# Patient Record
Sex: Female | Born: 1935 | Race: White | Hispanic: No | Smoking: Former smoker
Health system: Southern US, Community
[De-identification: ages and names within clinical notes are randomized; demographics above are authoritative.]

## PROBLEM LIST (undated history)

## (undated) DIAGNOSIS — N281 Cyst of kidney, acquired: Secondary | ICD-10-CM

## (undated) DIAGNOSIS — E2749 Other adrenocortical insufficiency: Secondary | ICD-10-CM

## (undated) DIAGNOSIS — Z8639 Personal history of other endocrine, nutritional and metabolic disease: Secondary | ICD-10-CM

## (undated) DIAGNOSIS — F0390 Unspecified dementia without behavioral disturbance: Secondary | ICD-10-CM

## (undated) DIAGNOSIS — D472 Monoclonal gammopathy: Secondary | ICD-10-CM

## (undated) DIAGNOSIS — R0602 Shortness of breath: Secondary | ICD-10-CM

## (undated) DIAGNOSIS — M1990 Unspecified osteoarthritis, unspecified site: Secondary | ICD-10-CM

## (undated) DIAGNOSIS — D649 Anemia, unspecified: Secondary | ICD-10-CM

## (undated) DIAGNOSIS — F419 Anxiety disorder, unspecified: Secondary | ICD-10-CM

## (undated) DIAGNOSIS — F329 Major depressive disorder, single episode, unspecified: Secondary | ICD-10-CM

## (undated) DIAGNOSIS — L309 Dermatitis, unspecified: Secondary | ICD-10-CM

## (undated) DIAGNOSIS — M25559 Pain in unspecified hip: Secondary | ICD-10-CM

## (undated) DIAGNOSIS — N952 Postmenopausal atrophic vaginitis: Secondary | ICD-10-CM

## (undated) DIAGNOSIS — K5730 Diverticulosis of large intestine without perforation or abscess without bleeding: Secondary | ICD-10-CM

## (undated) DIAGNOSIS — M48061 Spinal stenosis, lumbar region without neurogenic claudication: Secondary | ICD-10-CM

## (undated) DIAGNOSIS — Z8719 Personal history of other diseases of the digestive system: Secondary | ICD-10-CM

## (undated) DIAGNOSIS — L570 Actinic keratosis: Secondary | ICD-10-CM

## (undated) DIAGNOSIS — N184 Chronic kidney disease, stage 4 (severe): Secondary | ICD-10-CM

## (undated) DIAGNOSIS — F32A Depression, unspecified: Secondary | ICD-10-CM

## (undated) DIAGNOSIS — N393 Stress incontinence (female) (male): Secondary | ICD-10-CM

## (undated) DIAGNOSIS — Z87891 Personal history of nicotine dependence: Secondary | ICD-10-CM

## (undated) DIAGNOSIS — Z9289 Personal history of other medical treatment: Secondary | ICD-10-CM

## (undated) DIAGNOSIS — S72012A Unspecified intracapsular fracture of left femur, initial encounter for closed fracture: Secondary | ICD-10-CM

## (undated) DIAGNOSIS — M8580 Other specified disorders of bone density and structure, unspecified site: Secondary | ICD-10-CM

## (undated) DIAGNOSIS — Z8711 Personal history of peptic ulcer disease: Secondary | ICD-10-CM

## (undated) DIAGNOSIS — K9041 Non-celiac gluten sensitivity: Secondary | ICD-10-CM

## (undated) DIAGNOSIS — C189 Malignant neoplasm of colon, unspecified: Secondary | ICD-10-CM

## (undated) HISTORY — DX: Personal history of nicotine dependence: Z87.891

## (undated) HISTORY — PX: KNEE ARTHROSCOPY: SHX127

## (undated) HISTORY — PX: CARPAL TUNNEL RELEASE: SHX101

## (undated) HISTORY — DX: Anxiety disorder, unspecified: F41.9

## (undated) HISTORY — PX: HEMICOLECTOMY: SHX854

## (undated) HISTORY — DX: Actinic keratosis: L57.0

## (undated) HISTORY — DX: Pain in unspecified hip: M25.559

## (undated) HISTORY — DX: Unspecified dementia, unspecified severity, without behavioral disturbance, psychotic disturbance, mood disturbance, and anxiety: F03.90

## (undated) HISTORY — PX: OVARIAN CYST REMOVAL: SHX89

## (undated) HISTORY — DX: Major depressive disorder, single episode, unspecified: F32.9

## (undated) HISTORY — DX: Spinal stenosis, lumbar region without neurogenic claudication: M48.061

## (undated) HISTORY — DX: Monoclonal gammopathy: D47.2

## (undated) HISTORY — DX: Cyst of kidney, acquired: N28.1

## (undated) HISTORY — DX: Diverticulosis of large intestine without perforation or abscess without bleeding: K57.30

## (undated) HISTORY — DX: Postmenopausal atrophic vaginitis: N95.2

## (undated) HISTORY — DX: Depression, unspecified: F32.A

## (undated) HISTORY — DX: Dermatitis, unspecified: L30.9

## (undated) HISTORY — PX: CATARACT EXTRACTION W/ INTRAOCULAR LENS  IMPLANT, BILATERAL: SHX1307

## (undated) HISTORY — PX: CHOLECYSTECTOMY: SHX55

## (undated) HISTORY — DX: Other specified disorders of bone density and structure, unspecified site: M85.80

## (undated) HISTORY — PX: LUMBAR LAMINECTOMY: SHX95

## (undated) HISTORY — PX: LUMBAR FUSION: SHX111

## (undated) HISTORY — PX: APPENDECTOMY: SHX54

## (undated) HISTORY — PX: BACK SURGERY: SHX140

## (undated) HISTORY — PX: REPLACEMENT TOTAL KNEE: SUR1224

## (undated) HISTORY — PX: DILATION AND CURETTAGE OF UTERUS: SHX78

---
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
## 1997-08-22 ENCOUNTER — Other Ambulatory Visit: Admission: RE | Admit: 1997-08-22 | Discharge: 1997-08-22 | Payer: Self-pay | Admitting: Internal Medicine

## 1997-08-22 ENCOUNTER — Encounter: Admission: RE | Admit: 1997-08-22 | Discharge: 1997-08-22 | Payer: Self-pay | Admitting: Internal Medicine

## 1997-09-30 ENCOUNTER — Encounter: Admission: RE | Admit: 1997-09-30 | Discharge: 1997-09-30 | Payer: Self-pay | Admitting: Internal Medicine

## 1997-11-24 ENCOUNTER — Encounter: Admission: RE | Admit: 1997-11-24 | Discharge: 1997-11-24 | Payer: Self-pay | Admitting: Internal Medicine

## 1997-11-24 ENCOUNTER — Ambulatory Visit (HOSPITAL_COMMUNITY): Admission: RE | Admit: 1997-11-24 | Discharge: 1997-11-24 | Payer: Self-pay | Admitting: Internal Medicine

## 1998-02-17 ENCOUNTER — Encounter: Admission: RE | Admit: 1998-02-17 | Discharge: 1998-02-17 | Payer: Self-pay | Admitting: Internal Medicine

## 1998-02-19 ENCOUNTER — Emergency Department (HOSPITAL_COMMUNITY): Admission: EM | Admit: 1998-02-19 | Discharge: 1998-02-19 | Payer: Self-pay | Admitting: Emergency Medicine

## 1998-02-21 ENCOUNTER — Emergency Department (HOSPITAL_COMMUNITY): Admission: EM | Admit: 1998-02-21 | Discharge: 1998-02-21 | Payer: Self-pay | Admitting: Emergency Medicine

## 1998-12-01 ENCOUNTER — Emergency Department (HOSPITAL_COMMUNITY): Admission: EM | Admit: 1998-12-01 | Discharge: 1998-12-01 | Payer: Self-pay | Admitting: Emergency Medicine

## 1998-12-01 ENCOUNTER — Encounter: Payer: Self-pay | Admitting: Emergency Medicine

## 1998-12-01 ENCOUNTER — Encounter: Admission: RE | Admit: 1998-12-01 | Discharge: 1998-12-01 | Payer: Self-pay | Admitting: Internal Medicine

## 1999-02-02 ENCOUNTER — Encounter: Admission: RE | Admit: 1999-02-02 | Discharge: 1999-02-02 | Payer: Self-pay | Admitting: Internal Medicine

## 1999-05-24 ENCOUNTER — Encounter: Admission: RE | Admit: 1999-05-24 | Discharge: 1999-05-24 | Payer: Self-pay | Admitting: Internal Medicine

## 1999-05-30 ENCOUNTER — Ambulatory Visit (HOSPITAL_COMMUNITY): Admission: RE | Admit: 1999-05-30 | Discharge: 1999-05-30 | Payer: Self-pay

## 1999-10-12 ENCOUNTER — Encounter: Admission: RE | Admit: 1999-10-12 | Discharge: 1999-10-12 | Payer: Self-pay | Admitting: Internal Medicine

## 1999-12-13 ENCOUNTER — Ambulatory Visit (HOSPITAL_COMMUNITY): Admission: RE | Admit: 1999-12-13 | Discharge: 1999-12-13 | Payer: Self-pay | Admitting: Gastroenterology

## 1999-12-13 ENCOUNTER — Encounter (INDEPENDENT_AMBULATORY_CARE_PROVIDER_SITE_OTHER): Payer: Self-pay | Admitting: Specialist

## 1999-12-15 ENCOUNTER — Ambulatory Visit (HOSPITAL_COMMUNITY): Admission: RE | Admit: 1999-12-15 | Discharge: 1999-12-15 | Payer: Self-pay | Admitting: Gastroenterology

## 1999-12-15 ENCOUNTER — Encounter: Payer: Self-pay | Admitting: Gastroenterology

## 1999-12-20 ENCOUNTER — Encounter: Admission: RE | Admit: 1999-12-20 | Discharge: 1999-12-20 | Payer: Self-pay | Admitting: Gastroenterology

## 1999-12-20 ENCOUNTER — Encounter: Payer: Self-pay | Admitting: Gastroenterology

## 2000-01-10 ENCOUNTER — Encounter: Payer: Self-pay | Admitting: General Surgery

## 2000-01-15 ENCOUNTER — Encounter (INDEPENDENT_AMBULATORY_CARE_PROVIDER_SITE_OTHER): Payer: Self-pay | Admitting: Specialist

## 2000-01-15 ENCOUNTER — Inpatient Hospital Stay (HOSPITAL_COMMUNITY): Admission: RE | Admit: 2000-01-15 | Discharge: 2000-02-02 | Payer: Self-pay | Admitting: General Surgery

## 2000-01-19 ENCOUNTER — Encounter: Payer: Self-pay | Admitting: General Surgery

## 2000-01-22 ENCOUNTER — Encounter: Payer: Self-pay | Admitting: General Surgery

## 2000-01-24 ENCOUNTER — Encounter: Payer: Self-pay | Admitting: General Surgery

## 2000-01-24 ENCOUNTER — Encounter: Payer: Self-pay | Admitting: Gastroenterology

## 2000-01-25 ENCOUNTER — Encounter: Payer: Self-pay | Admitting: General Surgery

## 2000-05-23 ENCOUNTER — Inpatient Hospital Stay (HOSPITAL_COMMUNITY): Admission: EM | Admit: 2000-05-23 | Discharge: 2000-05-27 | Payer: Self-pay | Admitting: Emergency Medicine

## 2000-05-23 ENCOUNTER — Encounter: Payer: Self-pay | Admitting: General Surgery

## 2000-09-11 ENCOUNTER — Encounter: Admission: RE | Admit: 2000-09-11 | Discharge: 2000-09-11 | Payer: Self-pay | Admitting: General Surgery

## 2000-09-11 ENCOUNTER — Encounter: Payer: Self-pay | Admitting: General Surgery

## 2000-09-18 ENCOUNTER — Encounter: Admission: RE | Admit: 2000-09-18 | Discharge: 2000-09-18 | Payer: Self-pay | Admitting: Internal Medicine

## 2000-09-19 ENCOUNTER — Encounter: Admission: RE | Admit: 2000-09-19 | Discharge: 2000-09-19 | Payer: Self-pay | Admitting: Internal Medicine

## 2000-09-23 ENCOUNTER — Encounter: Payer: Self-pay | Admitting: Internal Medicine

## 2000-09-23 ENCOUNTER — Encounter: Admission: RE | Admit: 2000-09-23 | Discharge: 2000-09-23 | Payer: Self-pay | Admitting: Internal Medicine

## 2000-12-08 ENCOUNTER — Encounter: Admission: RE | Admit: 2000-12-08 | Discharge: 2000-12-08 | Payer: Self-pay | Admitting: Internal Medicine

## 2001-01-21 ENCOUNTER — Encounter (INDEPENDENT_AMBULATORY_CARE_PROVIDER_SITE_OTHER): Payer: Self-pay | Admitting: *Deleted

## 2001-01-21 ENCOUNTER — Ambulatory Visit (HOSPITAL_COMMUNITY): Admission: RE | Admit: 2001-01-21 | Discharge: 2001-01-21 | Payer: Self-pay | Admitting: Gastroenterology

## 2001-02-25 ENCOUNTER — Encounter: Payer: Self-pay | Admitting: General Surgery

## 2001-02-25 ENCOUNTER — Encounter: Admission: RE | Admit: 2001-02-25 | Discharge: 2001-02-25 | Payer: Self-pay | Admitting: General Surgery

## 2001-03-02 ENCOUNTER — Encounter (INDEPENDENT_AMBULATORY_CARE_PROVIDER_SITE_OTHER): Payer: Self-pay | Admitting: Specialist

## 2001-03-02 ENCOUNTER — Ambulatory Visit (HOSPITAL_BASED_OUTPATIENT_CLINIC_OR_DEPARTMENT_OTHER): Admission: RE | Admit: 2001-03-02 | Discharge: 2001-03-02 | Payer: Self-pay | Admitting: General Surgery

## 2001-03-30 ENCOUNTER — Encounter: Payer: Self-pay | Admitting: Emergency Medicine

## 2001-03-31 ENCOUNTER — Encounter: Admission: RE | Admit: 2001-03-31 | Discharge: 2001-03-31 | Payer: Self-pay

## 2001-03-31 ENCOUNTER — Inpatient Hospital Stay (HOSPITAL_COMMUNITY): Admission: EM | Admit: 2001-03-31 | Discharge: 2001-04-06 | Payer: Self-pay | Admitting: Emergency Medicine

## 2001-03-31 ENCOUNTER — Encounter: Payer: Self-pay | Admitting: General Surgery

## 2001-03-31 ENCOUNTER — Encounter: Payer: Self-pay | Admitting: Emergency Medicine

## 2001-04-01 ENCOUNTER — Encounter: Payer: Self-pay | Admitting: General Surgery

## 2001-04-02 ENCOUNTER — Encounter: Payer: Self-pay | Admitting: General Surgery

## 2001-04-03 ENCOUNTER — Encounter: Payer: Self-pay | Admitting: General Surgery

## 2001-06-24 ENCOUNTER — Encounter: Admission: RE | Admit: 2001-06-24 | Discharge: 2001-06-24 | Payer: Self-pay | Admitting: Internal Medicine

## 2001-07-14 ENCOUNTER — Encounter: Admission: RE | Admit: 2001-07-14 | Discharge: 2001-07-14 | Payer: Self-pay | Admitting: Internal Medicine

## 2001-07-31 ENCOUNTER — Encounter: Admission: RE | Admit: 2001-07-31 | Discharge: 2001-07-31 | Payer: Self-pay | Admitting: Internal Medicine

## 2001-07-31 ENCOUNTER — Other Ambulatory Visit: Admission: RE | Admit: 2001-07-31 | Discharge: 2001-07-31 | Payer: Self-pay | Admitting: Internal Medicine

## 2001-10-08 ENCOUNTER — Encounter: Admission: RE | Admit: 2001-10-08 | Discharge: 2001-10-08 | Payer: Self-pay | Admitting: Internal Medicine

## 2001-10-08 ENCOUNTER — Encounter: Payer: Self-pay | Admitting: Internal Medicine

## 2001-12-30 ENCOUNTER — Encounter: Admission: RE | Admit: 2001-12-30 | Discharge: 2001-12-30 | Payer: Self-pay | Admitting: Internal Medicine

## 2002-07-20 ENCOUNTER — Encounter: Admission: RE | Admit: 2002-07-20 | Discharge: 2002-07-20 | Payer: Self-pay | Admitting: Internal Medicine

## 2002-08-09 ENCOUNTER — Encounter: Admission: RE | Admit: 2002-08-09 | Discharge: 2002-08-09 | Payer: Self-pay | Admitting: Infectious Diseases

## 2002-08-16 ENCOUNTER — Encounter: Admission: RE | Admit: 2002-08-16 | Discharge: 2002-08-16 | Payer: Self-pay | Admitting: Internal Medicine

## 2002-08-23 ENCOUNTER — Encounter: Admission: RE | Admit: 2002-08-23 | Discharge: 2002-08-23 | Payer: Self-pay | Admitting: Internal Medicine

## 2002-08-27 ENCOUNTER — Encounter (INDEPENDENT_AMBULATORY_CARE_PROVIDER_SITE_OTHER): Payer: Self-pay | Admitting: Specialist

## 2002-08-27 ENCOUNTER — Ambulatory Visit (HOSPITAL_COMMUNITY): Admission: RE | Admit: 2002-08-27 | Discharge: 2002-08-27 | Payer: Self-pay | Admitting: Gastroenterology

## 2002-08-28 ENCOUNTER — Encounter: Payer: Self-pay | Admitting: Gastroenterology

## 2002-08-30 ENCOUNTER — Inpatient Hospital Stay (HOSPITAL_COMMUNITY): Admission: EM | Admit: 2002-08-30 | Discharge: 2002-08-31 | Payer: Self-pay | Admitting: Emergency Medicine

## 2002-09-14 ENCOUNTER — Encounter: Payer: Self-pay | Admitting: Internal Medicine

## 2002-09-14 ENCOUNTER — Encounter: Admission: RE | Admit: 2002-09-14 | Discharge: 2002-09-14 | Payer: Self-pay | Admitting: Internal Medicine

## 2002-12-09 ENCOUNTER — Encounter: Admission: RE | Admit: 2002-12-09 | Discharge: 2002-12-09 | Payer: Self-pay | Admitting: Internal Medicine

## 2002-12-27 ENCOUNTER — Encounter: Admission: RE | Admit: 2002-12-27 | Discharge: 2002-12-27 | Payer: Self-pay | Admitting: Internal Medicine

## 2002-12-28 ENCOUNTER — Encounter: Admission: RE | Admit: 2002-12-28 | Discharge: 2003-03-28 | Payer: Self-pay | Admitting: Internal Medicine

## 2003-03-09 ENCOUNTER — Encounter: Admission: RE | Admit: 2003-03-09 | Discharge: 2003-03-09 | Payer: Self-pay | Admitting: Internal Medicine

## 2003-03-15 ENCOUNTER — Encounter: Admission: RE | Admit: 2003-03-15 | Discharge: 2003-03-15 | Payer: Self-pay | Admitting: Internal Medicine

## 2003-06-23 ENCOUNTER — Encounter: Admission: RE | Admit: 2003-06-23 | Discharge: 2003-06-23 | Payer: Self-pay | Admitting: Internal Medicine

## 2003-08-03 ENCOUNTER — Encounter: Admission: RE | Admit: 2003-08-03 | Discharge: 2003-08-03 | Payer: Self-pay | Admitting: Orthopedic Surgery

## 2003-08-05 ENCOUNTER — Encounter: Admission: RE | Admit: 2003-08-05 | Discharge: 2003-08-05 | Payer: Self-pay | Admitting: Internal Medicine

## 2003-08-08 ENCOUNTER — Inpatient Hospital Stay (HOSPITAL_COMMUNITY): Admission: RE | Admit: 2003-08-08 | Discharge: 2003-08-12 | Payer: Self-pay | Admitting: Orthopedic Surgery

## 2003-08-12 ENCOUNTER — Inpatient Hospital Stay (HOSPITAL_COMMUNITY)
Admission: RE | Admit: 2003-08-12 | Discharge: 2003-08-19 | Payer: Self-pay | Admitting: Physical Medicine & Rehabilitation

## 2003-09-20 ENCOUNTER — Encounter: Admission: RE | Admit: 2003-09-20 | Discharge: 2003-11-03 | Payer: Self-pay | Admitting: Orthopedic Surgery

## 2003-10-28 ENCOUNTER — Encounter: Admission: RE | Admit: 2003-10-28 | Discharge: 2003-10-28 | Payer: Self-pay | Admitting: Internal Medicine

## 2003-11-23 ENCOUNTER — Encounter: Admission: RE | Admit: 2003-11-23 | Discharge: 2003-11-23 | Payer: Self-pay | Admitting: Internal Medicine

## 2004-01-03 ENCOUNTER — Ambulatory Visit: Payer: Self-pay | Admitting: Internal Medicine

## 2004-02-03 ENCOUNTER — Ambulatory Visit: Payer: Self-pay | Admitting: Internal Medicine

## 2004-02-08 ENCOUNTER — Ambulatory Visit: Payer: Self-pay | Admitting: Internal Medicine

## 2004-02-10 ENCOUNTER — Ambulatory Visit: Payer: Self-pay | Admitting: Internal Medicine

## 2004-02-10 ENCOUNTER — Ambulatory Visit (HOSPITAL_COMMUNITY): Admission: RE | Admit: 2004-02-10 | Discharge: 2004-02-10 | Payer: Self-pay | Admitting: Internal Medicine

## 2004-02-14 ENCOUNTER — Ambulatory Visit: Payer: Self-pay | Admitting: Internal Medicine

## 2004-02-17 ENCOUNTER — Ambulatory Visit: Payer: Self-pay | Admitting: Internal Medicine

## 2004-02-24 ENCOUNTER — Ambulatory Visit: Payer: Self-pay | Admitting: Internal Medicine

## 2004-04-04 ENCOUNTER — Ambulatory Visit: Payer: Self-pay | Admitting: Internal Medicine

## 2004-04-18 ENCOUNTER — Ambulatory Visit: Payer: Self-pay | Admitting: Internal Medicine

## 2004-06-21 ENCOUNTER — Ambulatory Visit: Payer: Self-pay | Admitting: Internal Medicine

## 2004-08-14 ENCOUNTER — Ambulatory Visit: Payer: Self-pay | Admitting: Internal Medicine

## 2004-08-21 ENCOUNTER — Ambulatory Visit: Payer: Self-pay | Admitting: Internal Medicine

## 2004-09-17 ENCOUNTER — Ambulatory Visit: Payer: Self-pay | Admitting: Internal Medicine

## 2004-12-17 ENCOUNTER — Ambulatory Visit: Payer: Self-pay | Admitting: Internal Medicine

## 2005-01-21 ENCOUNTER — Encounter: Admission: RE | Admit: 2005-01-21 | Discharge: 2005-01-21 | Payer: Self-pay | Admitting: Orthopedic Surgery

## 2005-01-24 ENCOUNTER — Encounter: Admission: RE | Admit: 2005-01-24 | Discharge: 2005-01-24 | Payer: Self-pay | Admitting: Orthopedic Surgery

## 2005-01-29 ENCOUNTER — Ambulatory Visit: Payer: Self-pay | Admitting: Hospitalist

## 2005-02-18 ENCOUNTER — Ambulatory Visit (HOSPITAL_COMMUNITY): Admission: RE | Admit: 2005-02-18 | Discharge: 2005-02-18 | Payer: Self-pay | Admitting: Neurosurgery

## 2005-02-21 ENCOUNTER — Ambulatory Visit: Payer: Self-pay | Admitting: Physical Medicine & Rehabilitation

## 2005-02-21 ENCOUNTER — Inpatient Hospital Stay (HOSPITAL_COMMUNITY): Admission: RE | Admit: 2005-02-21 | Discharge: 2005-02-28 | Payer: Self-pay | Admitting: Neurosurgery

## 2005-02-28 ENCOUNTER — Inpatient Hospital Stay (HOSPITAL_COMMUNITY)
Admission: RE | Admit: 2005-02-28 | Discharge: 2005-03-12 | Payer: Self-pay | Admitting: Physical Medicine & Rehabilitation

## 2005-02-28 ENCOUNTER — Ambulatory Visit: Payer: Self-pay | Admitting: Physical Medicine & Rehabilitation

## 2005-07-29 ENCOUNTER — Encounter: Admission: RE | Admit: 2005-07-29 | Discharge: 2005-07-29 | Payer: Self-pay | Admitting: Gastroenterology

## 2005-08-05 ENCOUNTER — Ambulatory Visit (HOSPITAL_COMMUNITY): Admission: RE | Admit: 2005-08-05 | Discharge: 2005-08-05 | Payer: Self-pay | Admitting: Gastroenterology

## 2005-08-09 ENCOUNTER — Ambulatory Visit (HOSPITAL_COMMUNITY): Admission: RE | Admit: 2005-08-09 | Discharge: 2005-08-09 | Payer: Self-pay | Admitting: Gastroenterology

## 2005-10-24 ENCOUNTER — Ambulatory Visit: Payer: Self-pay | Admitting: Internal Medicine

## 2006-01-21 ENCOUNTER — Ambulatory Visit: Payer: Self-pay | Admitting: Internal Medicine

## 2006-01-24 ENCOUNTER — Ambulatory Visit (HOSPITAL_COMMUNITY): Admission: RE | Admit: 2006-01-24 | Discharge: 2006-01-24 | Payer: Self-pay | Admitting: Internal Medicine

## 2006-03-24 DIAGNOSIS — F329 Major depressive disorder, single episode, unspecified: Secondary | ICD-10-CM

## 2006-04-08 ENCOUNTER — Ambulatory Visit: Payer: Self-pay | Admitting: *Deleted

## 2006-04-24 ENCOUNTER — Inpatient Hospital Stay (HOSPITAL_COMMUNITY): Admission: EM | Admit: 2006-04-24 | Discharge: 2006-05-01 | Payer: Self-pay | Admitting: Emergency Medicine

## 2006-05-15 ENCOUNTER — Inpatient Hospital Stay (HOSPITAL_COMMUNITY): Admission: EM | Admit: 2006-05-15 | Discharge: 2006-05-24 | Payer: Self-pay | Admitting: Emergency Medicine

## 2006-05-16 ENCOUNTER — Ambulatory Visit: Payer: Self-pay | Admitting: *Deleted

## 2006-07-10 ENCOUNTER — Telehealth: Payer: Self-pay | Admitting: *Deleted

## 2006-08-12 ENCOUNTER — Telehealth (INDEPENDENT_AMBULATORY_CARE_PROVIDER_SITE_OTHER): Payer: Self-pay | Admitting: *Deleted

## 2006-08-22 ENCOUNTER — Ambulatory Visit: Payer: Self-pay | Admitting: Hospitalist

## 2006-09-24 ENCOUNTER — Ambulatory Visit: Payer: Self-pay | Admitting: Internal Medicine

## 2006-09-24 ENCOUNTER — Encounter (INDEPENDENT_AMBULATORY_CARE_PROVIDER_SITE_OTHER): Payer: Self-pay | Admitting: Dermatology

## 2007-02-12 ENCOUNTER — Encounter: Admission: RE | Admit: 2007-02-12 | Discharge: 2007-02-12 | Payer: Self-pay | Admitting: *Deleted

## 2007-03-02 ENCOUNTER — Ambulatory Visit: Payer: Self-pay | Admitting: Hospitalist

## 2007-03-02 ENCOUNTER — Encounter (INDEPENDENT_AMBULATORY_CARE_PROVIDER_SITE_OTHER): Payer: Self-pay | Admitting: *Deleted

## 2007-03-02 LAB — CONVERTED CEMR LAB
ALT: 8 units/L (ref 0–35)
AST: 10 units/L (ref 0–37)
Albumin: 3.7 g/dL (ref 3.5–5.2)
BUN: 13 mg/dL (ref 6–23)
CO2: 27 meq/L (ref 19–32)
Calcium: 8.9 mg/dL (ref 8.4–10.5)
Chloride: 109 meq/L (ref 96–112)
Potassium: 4.7 meq/L (ref 3.5–5.3)

## 2007-06-12 ENCOUNTER — Encounter (INDEPENDENT_AMBULATORY_CARE_PROVIDER_SITE_OTHER): Payer: Self-pay | Admitting: *Deleted

## 2007-06-12 ENCOUNTER — Ambulatory Visit: Payer: Self-pay | Admitting: Internal Medicine

## 2007-06-12 DIAGNOSIS — L408 Other psoriasis: Secondary | ICD-10-CM

## 2007-06-13 ENCOUNTER — Inpatient Hospital Stay (HOSPITAL_COMMUNITY): Admission: EM | Admit: 2007-06-13 | Discharge: 2007-06-15 | Payer: Self-pay | Admitting: Emergency Medicine

## 2007-06-13 ENCOUNTER — Ambulatory Visit: Payer: Self-pay | Admitting: Infectious Diseases

## 2007-06-19 ENCOUNTER — Encounter (INDEPENDENT_AMBULATORY_CARE_PROVIDER_SITE_OTHER): Payer: Self-pay | Admitting: *Deleted

## 2007-06-19 ENCOUNTER — Ambulatory Visit: Payer: Self-pay | Admitting: Internal Medicine

## 2007-08-03 ENCOUNTER — Encounter: Admission: RE | Admit: 2007-08-03 | Discharge: 2007-08-03 | Payer: Self-pay | Admitting: Gastroenterology

## 2007-08-27 ENCOUNTER — Encounter (INDEPENDENT_AMBULATORY_CARE_PROVIDER_SITE_OTHER): Payer: Self-pay | Admitting: *Deleted

## 2007-08-27 ENCOUNTER — Ambulatory Visit: Payer: Self-pay | Admitting: Internal Medicine

## 2007-08-27 LAB — CONVERTED CEMR LAB
BUN: 15 mg/dL (ref 6–23)
CO2: 26 meq/L (ref 19–32)
Calcium: 9.1 mg/dL (ref 8.4–10.5)
Chloride: 106 meq/L (ref 96–112)
Creatinine, Ser: 0.79 mg/dL (ref 0.40–1.20)
Glucose, Bld: 90 mg/dL (ref 70–99)
Magnesium: 1.7 mg/dL (ref 1.5–2.5)
Potassium: 4.9 meq/L (ref 3.5–5.3)
Sodium: 145 meq/L (ref 135–145)

## 2007-08-31 ENCOUNTER — Encounter (INDEPENDENT_AMBULATORY_CARE_PROVIDER_SITE_OTHER): Payer: Self-pay | Admitting: *Deleted

## 2007-08-31 ENCOUNTER — Encounter (INDEPENDENT_AMBULATORY_CARE_PROVIDER_SITE_OTHER): Payer: Self-pay | Admitting: Internal Medicine

## 2007-09-01 ENCOUNTER — Encounter (INDEPENDENT_AMBULATORY_CARE_PROVIDER_SITE_OTHER): Payer: Self-pay | Admitting: *Deleted

## 2007-09-15 ENCOUNTER — Encounter (INDEPENDENT_AMBULATORY_CARE_PROVIDER_SITE_OTHER): Payer: Self-pay | Admitting: Internal Medicine

## 2007-10-04 ENCOUNTER — Telehealth (INDEPENDENT_AMBULATORY_CARE_PROVIDER_SITE_OTHER): Payer: Self-pay | Admitting: *Deleted

## 2007-10-05 ENCOUNTER — Encounter (INDEPENDENT_AMBULATORY_CARE_PROVIDER_SITE_OTHER): Payer: Self-pay | Admitting: *Deleted

## 2007-10-05 ENCOUNTER — Ambulatory Visit: Payer: Self-pay | Admitting: *Deleted

## 2007-10-15 ENCOUNTER — Ambulatory Visit: Payer: Self-pay | Admitting: Internal Medicine

## 2007-10-15 ENCOUNTER — Encounter (INDEPENDENT_AMBULATORY_CARE_PROVIDER_SITE_OTHER): Payer: Self-pay | Admitting: *Deleted

## 2007-10-15 DIAGNOSIS — K219 Gastro-esophageal reflux disease without esophagitis: Secondary | ICD-10-CM

## 2007-12-18 ENCOUNTER — Encounter: Payer: Self-pay | Admitting: Internal Medicine

## 2007-12-18 ENCOUNTER — Ambulatory Visit: Payer: Self-pay | Admitting: *Deleted

## 2007-12-22 LAB — CONVERTED CEMR LAB
AST: 17 units/L (ref 0–37)
Alkaline Phosphatase: 78 units/L (ref 39–117)
BUN: 10 mg/dL (ref 6–23)
Creatinine, Ser: 0.75 mg/dL (ref 0.40–1.20)
Creatinine, Urine: 155.1 mg/dL
Microalb, Ur: 1.75 mg/dL (ref 0.00–1.89)
Total Bilirubin: 0.2 mg/dL — ABNORMAL LOW (ref 0.3–1.2)

## 2007-12-24 ENCOUNTER — Ambulatory Visit (HOSPITAL_COMMUNITY): Admission: RE | Admit: 2007-12-24 | Discharge: 2007-12-24 | Payer: Self-pay | Admitting: *Deleted

## 2007-12-24 ENCOUNTER — Ambulatory Visit: Payer: Self-pay | Admitting: Vascular Surgery

## 2007-12-24 ENCOUNTER — Encounter (INDEPENDENT_AMBULATORY_CARE_PROVIDER_SITE_OTHER): Payer: Self-pay | Admitting: *Deleted

## 2007-12-24 ENCOUNTER — Encounter (INDEPENDENT_AMBULATORY_CARE_PROVIDER_SITE_OTHER): Payer: Self-pay | Admitting: Internal Medicine

## 2008-01-18 ENCOUNTER — Ambulatory Visit: Payer: Self-pay | Admitting: Internal Medicine

## 2008-01-18 ENCOUNTER — Inpatient Hospital Stay (HOSPITAL_COMMUNITY): Admission: AD | Admit: 2008-01-18 | Discharge: 2008-01-22 | Payer: Self-pay | Admitting: Internal Medicine

## 2008-01-18 ENCOUNTER — Encounter (INDEPENDENT_AMBULATORY_CARE_PROVIDER_SITE_OTHER): Payer: Self-pay | Admitting: Internal Medicine

## 2008-01-27 ENCOUNTER — Telehealth: Payer: Self-pay | Admitting: Internal Medicine

## 2008-02-16 ENCOUNTER — Ambulatory Visit: Payer: Self-pay | Admitting: Internal Medicine

## 2008-02-16 DIAGNOSIS — E538 Deficiency of other specified B group vitamins: Secondary | ICD-10-CM

## 2008-02-22 ENCOUNTER — Telehealth (INDEPENDENT_AMBULATORY_CARE_PROVIDER_SITE_OTHER): Payer: Self-pay | Admitting: Internal Medicine

## 2008-02-25 ENCOUNTER — Ambulatory Visit: Payer: Self-pay | Admitting: Hematology and Oncology

## 2008-03-04 ENCOUNTER — Encounter (INDEPENDENT_AMBULATORY_CARE_PROVIDER_SITE_OTHER): Payer: Self-pay | Admitting: Internal Medicine

## 2008-03-04 LAB — CBC WITH DIFFERENTIAL/PLATELET
BASO%: 0.5 % (ref 0.0–2.0)
Basophils Absolute: 0 10*3/uL (ref 0.0–0.1)
EOS%: 2.7 % (ref 0.0–7.0)
HCT: 34.9 % (ref 34.8–46.6)
HGB: 11.7 g/dL (ref 11.6–15.9)
LYMPH%: 26.5 % (ref 14.0–48.0)
MCH: 29.3 pg (ref 26.0–34.0)
MCHC: 33.6 g/dL (ref 32.0–36.0)
MCV: 87.3 fL (ref 81.0–101.0)
MONO%: 4.6 % (ref 0.0–13.0)
NEUT%: 65.7 % (ref 39.6–76.8)
Platelets: 235 10*3/uL (ref 145–400)
lymph#: 1.6 10*3/uL (ref 0.9–3.3)

## 2008-03-08 LAB — COMPREHENSIVE METABOLIC PANEL
ALT: 11 U/L (ref 0–35)
AST: 13 U/L (ref 0–37)
Alkaline Phosphatase: 93 U/L (ref 39–117)
BUN: 15 mg/dL (ref 6–23)
Calcium: 8.3 mg/dL — ABNORMAL LOW (ref 8.4–10.5)
Creatinine, Ser: 0.91 mg/dL (ref 0.40–1.20)
Total Bilirubin: 0.3 mg/dL (ref 0.3–1.2)

## 2008-03-08 LAB — IGG, IGA, IGM
IgA: 617 mg/dL — ABNORMAL HIGH (ref 68–378)
IgG (Immunoglobin G), Serum: 1360 mg/dL (ref 694–1618)

## 2008-03-08 LAB — PROTEIN ELECTROPHORESIS, SERUM, WITH REFLEX
Alpha-1-Globulin: 5.9 % — ABNORMAL HIGH (ref 2.9–4.9)
Beta 2: 7.8 % — ABNORMAL HIGH (ref 3.2–6.5)
Gamma Globulin: 21.6 % — ABNORMAL HIGH (ref 11.1–18.8)

## 2008-03-08 LAB — KAPPA/LAMBDA LIGHT CHAINS: Lambda Free Lght Chn: 3.08 mg/dL — ABNORMAL HIGH (ref 0.57–2.63)

## 2008-03-09 ENCOUNTER — Ambulatory Visit (HOSPITAL_COMMUNITY): Admission: RE | Admit: 2008-03-09 | Discharge: 2008-03-09 | Payer: Self-pay | Admitting: Hematology and Oncology

## 2008-03-11 ENCOUNTER — Encounter (INDEPENDENT_AMBULATORY_CARE_PROVIDER_SITE_OTHER): Payer: Self-pay | Admitting: Internal Medicine

## 2008-03-23 ENCOUNTER — Ambulatory Visit (HOSPITAL_COMMUNITY): Admission: RE | Admit: 2008-03-23 | Discharge: 2008-03-23 | Payer: Self-pay | Admitting: Hematology and Oncology

## 2008-03-23 ENCOUNTER — Encounter (INDEPENDENT_AMBULATORY_CARE_PROVIDER_SITE_OTHER): Payer: Self-pay | Admitting: Interventional Radiology

## 2008-03-29 ENCOUNTER — Encounter (INDEPENDENT_AMBULATORY_CARE_PROVIDER_SITE_OTHER): Payer: Self-pay | Admitting: Internal Medicine

## 2008-07-01 ENCOUNTER — Ambulatory Visit: Payer: Self-pay | Admitting: Internal Medicine

## 2008-07-01 ENCOUNTER — Encounter (INDEPENDENT_AMBULATORY_CARE_PROVIDER_SITE_OTHER): Payer: Self-pay | Admitting: Internal Medicine

## 2008-07-01 LAB — CONVERTED CEMR LAB
Basophils Relative: 0 % (ref 0–1)
CO2: 23 meq/L (ref 19–32)
Calcium: 8.7 mg/dL (ref 8.4–10.5)
Eosinophils Absolute: 0.2 10*3/uL (ref 0.0–0.7)
Glucose, Bld: 92 mg/dL (ref 70–99)
HCT: 40.3 % (ref 36.0–46.0)
Hemoglobin: 12.6 g/dL (ref 12.0–15.0)
MCHC: 31.3 g/dL (ref 30.0–36.0)
Monocytes Absolute: 0.5 10*3/uL (ref 0.1–1.0)
Monocytes Relative: 7 % (ref 3–12)
Neutro Abs: 5.2 10*3/uL (ref 1.7–7.7)
RDW: 14.1 % (ref 11.5–15.5)
Sodium: 146 meq/L — ABNORMAL HIGH (ref 135–145)
Total Bilirubin: 0.3 mg/dL (ref 0.3–1.2)
Total Protein: 7 g/dL (ref 6.0–8.3)
Vitamin B-12: 644 pg/mL (ref 211–911)

## 2008-09-09 ENCOUNTER — Telehealth: Payer: Self-pay | Admitting: Internal Medicine

## 2008-09-21 ENCOUNTER — Ambulatory Visit: Payer: Self-pay | Admitting: Hematology and Oncology

## 2008-09-23 LAB — CBC WITH DIFFERENTIAL/PLATELET
BASO%: 0.3 % (ref 0.0–2.0)
EOS%: 2.4 % (ref 0.0–7.0)
Eosinophils Absolute: 0.2 10*3/uL (ref 0.0–0.5)
MCH: 27.6 pg (ref 25.1–34.0)
MCV: 84.4 fL (ref 79.5–101.0)
MONO%: 6.2 % (ref 0.0–14.0)
NEUT#: 4.5 10*3/uL (ref 1.5–6.5)
RBC: 4.42 10*6/uL (ref 3.70–5.45)
RDW: 14 % (ref 11.2–14.5)

## 2008-09-28 LAB — COMPREHENSIVE METABOLIC PANEL
AST: 10 U/L (ref 0–37)
Albumin: 3.4 g/dL — ABNORMAL LOW (ref 3.5–5.2)
BUN: 12 mg/dL (ref 6–23)
Calcium: 8.3 mg/dL — ABNORMAL LOW (ref 8.4–10.5)
Chloride: 109 mEq/L (ref 96–112)
Glucose, Bld: 96 mg/dL (ref 70–99)
Potassium: 3.6 mEq/L (ref 3.5–5.3)
Sodium: 140 mEq/L (ref 135–145)
Total Protein: 6.8 g/dL (ref 6.0–8.3)

## 2008-09-28 LAB — SPEP & IFE WITH QIG
Alpha-1-Globulin: 6.2 % — ABNORMAL HIGH (ref 2.9–4.9)
Beta 2: 6.4 % (ref 3.2–6.5)
Beta Globulin: 7.7 % — ABNORMAL HIGH (ref 4.7–7.2)
Gamma Globulin: 19.4 % — ABNORMAL HIGH (ref 11.1–18.8)
IgM, Serum: 89 mg/dL (ref 60–263)

## 2008-10-04 ENCOUNTER — Encounter (INDEPENDENT_AMBULATORY_CARE_PROVIDER_SITE_OTHER): Payer: Self-pay | Admitting: Internal Medicine

## 2008-10-20 ENCOUNTER — Encounter (INDEPENDENT_AMBULATORY_CARE_PROVIDER_SITE_OTHER): Payer: Self-pay | Admitting: Internal Medicine

## 2008-10-20 ENCOUNTER — Ambulatory Visit: Payer: Self-pay | Admitting: Internal Medicine

## 2008-10-20 LAB — CONVERTED CEMR LAB
AST: 10 units/L (ref 0–37)
Albumin: 3.2 g/dL — ABNORMAL LOW (ref 3.5–5.2)
Alkaline Phosphatase: 81 units/L (ref 39–117)
BUN: 13 mg/dL (ref 6–23)
Blood Glucose, AC Bkfst: 85 mg/dL
Creatinine, Ser: 0.74 mg/dL (ref 0.40–1.20)
HCT: 36.5 % (ref 36.0–46.0)
Hemoglobin: 11.9 g/dL — ABNORMAL LOW (ref 12.0–15.0)
MCV: 83 fL (ref 78.0–100.0)
Potassium: 3.4 meq/L — ABNORMAL LOW (ref 3.5–5.3)
RBC: 4.4 M/uL (ref 3.87–5.11)
TSH: 3.284 microintl units/mL (ref 0.350–4.500)
Total Bilirubin: 0.3 mg/dL (ref 0.3–1.2)
Total CHOL/HDL Ratio: 3.7
VLDL: 33 mg/dL (ref 0–40)
WBC: 3.6 10*3/uL — ABNORMAL LOW (ref 4.0–10.5)

## 2009-01-05 ENCOUNTER — Ambulatory Visit: Payer: Self-pay | Admitting: Infectious Diseases

## 2009-01-05 DIAGNOSIS — D472 Monoclonal gammopathy: Secondary | ICD-10-CM

## 2009-01-05 LAB — CONVERTED CEMR LAB
Bilirubin Urine: NEGATIVE
HCT: 39.4 %
Hemoglobin: 12.4 g/dL
Ketones, ur: NEGATIVE mg/dL
MCHC: 31.5 g/dL
MCV: 84.5 fL
Nitrite: NEGATIVE
Platelets: 267 K/uL
Protein, ur: 30 mg/dL — AB
RBC / HPF: NONE SEEN
RBC: 4.66 M/uL
RDW: 15 %
Specific Gravity, Urine: 1.027
Urine Glucose: NEGATIVE mg/dL
Urobilinogen, UA: 0.2
WBC: 7.4 10*3/microliter
pH: 6

## 2009-04-12 ENCOUNTER — Telehealth (INDEPENDENT_AMBULATORY_CARE_PROVIDER_SITE_OTHER): Payer: Self-pay | Admitting: Internal Medicine

## 2009-04-12 ENCOUNTER — Ambulatory Visit (HOSPITAL_COMMUNITY): Admission: RE | Admit: 2009-04-12 | Discharge: 2009-04-12 | Payer: Self-pay | Admitting: Internal Medicine

## 2009-04-12 ENCOUNTER — Ambulatory Visit: Payer: Self-pay | Admitting: Internal Medicine

## 2009-04-12 LAB — CONVERTED CEMR LAB
BUN: 12 mg/dL
Basophils Absolute: 0 K/uL
Basophils Relative: 0 %
CO2: 23 meq/L
Calcium: 8 mg/dL — ABNORMAL LOW
Chloride: 109 meq/L
Creatinine, Ser: 0.71 mg/dL
Eosinophils Absolute: 0.1 K/uL
Eosinophils Relative: 2 %
Glucose, Bld: 83 mg/dL
HCT: 37.3 %
Hemoglobin: 12.3 g/dL
Lymphocytes Relative: 25 %
Lymphs Abs: 1.6 K/uL
MCHC: 33 g/dL
MCV: 85.9 fL
Monocytes Absolute: 0.5 K/uL
Monocytes Relative: 8 %
Neutro Abs: 4.3 K/uL
Neutrophils Relative %: 66 %
Platelets: 216 K/uL
Potassium: 3.9 meq/L
RBC: 4.34 M/uL
RDW: 14.3 %
Sodium: 144 meq/L
Vitamin B-12: 313 pg/mL
WBC: 6.5 10*3/microliter

## 2009-04-13 ENCOUNTER — Ambulatory Visit: Payer: Self-pay | Admitting: Internal Medicine

## 2009-04-13 ENCOUNTER — Encounter (INDEPENDENT_AMBULATORY_CARE_PROVIDER_SITE_OTHER): Payer: Self-pay | Admitting: Internal Medicine

## 2009-04-13 ENCOUNTER — Inpatient Hospital Stay (HOSPITAL_COMMUNITY): Admission: EM | Admit: 2009-04-13 | Discharge: 2009-04-16 | Payer: Self-pay | Admitting: Emergency Medicine

## 2009-04-16 ENCOUNTER — Encounter: Payer: Self-pay | Admitting: Internal Medicine

## 2009-04-26 ENCOUNTER — Telehealth (INDEPENDENT_AMBULATORY_CARE_PROVIDER_SITE_OTHER): Payer: Self-pay | Admitting: *Deleted

## 2009-05-01 ENCOUNTER — Ambulatory Visit: Payer: Self-pay | Admitting: Internal Medicine

## 2009-05-03 ENCOUNTER — Telehealth: Payer: Self-pay | Admitting: Internal Medicine

## 2009-06-05 ENCOUNTER — Ambulatory Visit: Payer: Self-pay | Admitting: Internal Medicine

## 2009-06-27 ENCOUNTER — Telehealth: Payer: Self-pay | Admitting: Internal Medicine

## 2009-06-30 ENCOUNTER — Ambulatory Visit: Payer: Self-pay | Admitting: Hematology and Oncology

## 2009-07-04 LAB — CBC WITH DIFFERENTIAL/PLATELET
EOS%: 1.2 % (ref 0.0–7.0)
MCH: 28.8 pg (ref 25.1–34.0)
MCV: 86.8 fL (ref 79.5–101.0)
MONO%: 5.2 % (ref 0.0–14.0)
NEUT#: 5.2 10*3/uL (ref 1.5–6.5)
RBC: 4.43 10*6/uL (ref 3.70–5.45)
RDW: 15 % — ABNORMAL HIGH (ref 11.2–14.5)
lymph#: 1.5 10*3/uL (ref 0.9–3.3)

## 2009-07-06 ENCOUNTER — Encounter: Payer: Self-pay | Admitting: Internal Medicine

## 2009-07-06 LAB — COMPREHENSIVE METABOLIC PANEL
ALT: <8 U/L (ref 0–35)
AST: 10 U/L (ref 0–37)
Albumin: 3.3 g/dL — ABNORMAL LOW (ref 3.5–5.2)
Alkaline Phosphatase: 86 U/L (ref 39–117)
Chloride: 107 mEq/L (ref 96–112)
Potassium: 3.9 mEq/L (ref 3.5–5.3)
Sodium: 142 mEq/L (ref 135–145)
Total Protein: 6.9 g/dL (ref 6.0–8.3)

## 2009-07-06 LAB — SPEP & IFE WITH QIG
Albumin ELP: 43.1 % — ABNORMAL LOW (ref 55.8–66.1)
Alpha-1-Globulin: 7.8 % — ABNORMAL HIGH (ref 2.9–4.9)
Alpha-2-Globulin: 12.1 % — ABNORMAL HIGH (ref 7.1–11.8)
Beta 2: 7.7 % — ABNORMAL HIGH (ref 3.2–6.5)
Beta Globulin: 7.5 % — ABNORMAL HIGH (ref 4.7–7.2)

## 2009-07-14 ENCOUNTER — Ambulatory Visit (HOSPITAL_COMMUNITY): Admission: RE | Admit: 2009-07-14 | Discharge: 2009-07-14 | Payer: Self-pay | Admitting: Hematology and Oncology

## 2009-08-22 ENCOUNTER — Ambulatory Visit: Payer: Self-pay | Admitting: Internal Medicine

## 2009-08-22 DIAGNOSIS — L219 Seborrheic dermatitis, unspecified: Secondary | ICD-10-CM

## 2009-08-23 ENCOUNTER — Encounter: Payer: Self-pay | Admitting: Internal Medicine

## 2009-08-23 LAB — CONVERTED CEMR LAB
BUN: 16 mg/dL (ref 6–23)
Calcium: 7.6 mg/dL — ABNORMAL LOW (ref 8.4–10.5)
Creatinine, Ser: 0.83 mg/dL (ref 0.40–1.20)
Glucose, Bld: 97 mg/dL (ref 70–99)
Magnesium: 1.2 mg/dL — ABNORMAL LOW (ref 1.5–2.5)
Potassium: 3.4 meq/L — ABNORMAL LOW (ref 3.5–5.3)
TSH: 3.218 microintl units/mL (ref 0.350–4.5)

## 2009-09-01 ENCOUNTER — Ambulatory Visit: Payer: Self-pay | Admitting: Internal Medicine

## 2009-09-01 ENCOUNTER — Encounter: Payer: Self-pay | Admitting: Internal Medicine

## 2009-09-01 LAB — CONVERTED CEMR LAB
BUN: 15 mg/dL (ref 6–23)
Creatinine, Ser: 0.93 mg/dL (ref 0.40–1.20)
Glucose, Bld: 96 mg/dL (ref 70–99)
Magnesium: 2 mg/dL (ref 1.5–2.5)
Potassium: 4.1 meq/L (ref 3.5–5.3)

## 2009-09-05 ENCOUNTER — Telehealth: Payer: Self-pay | Admitting: Internal Medicine

## 2009-09-06 ENCOUNTER — Telehealth (INDEPENDENT_AMBULATORY_CARE_PROVIDER_SITE_OTHER): Payer: Self-pay | Admitting: *Deleted

## 2009-09-07 ENCOUNTER — Inpatient Hospital Stay (HOSPITAL_COMMUNITY): Admission: EM | Admit: 2009-09-07 | Discharge: 2009-09-11 | Payer: Self-pay | Admitting: Emergency Medicine

## 2009-09-07 ENCOUNTER — Ambulatory Visit: Payer: Self-pay | Admitting: Internal Medicine

## 2009-09-07 ENCOUNTER — Encounter: Payer: Self-pay | Admitting: Internal Medicine

## 2009-09-11 ENCOUNTER — Encounter (INDEPENDENT_AMBULATORY_CARE_PROVIDER_SITE_OTHER): Payer: Self-pay | Admitting: Internal Medicine

## 2009-10-16 ENCOUNTER — Encounter: Payer: Self-pay | Admitting: Internal Medicine

## 2009-10-16 ENCOUNTER — Ambulatory Visit: Payer: Self-pay | Admitting: Internal Medicine

## 2009-10-16 ENCOUNTER — Inpatient Hospital Stay (HOSPITAL_COMMUNITY): Admission: EM | Admit: 2009-10-16 | Discharge: 2009-10-22 | Payer: Self-pay | Admitting: Emergency Medicine

## 2009-10-22 ENCOUNTER — Encounter: Payer: Self-pay | Admitting: Internal Medicine

## 2009-11-08 ENCOUNTER — Ambulatory Visit: Payer: Self-pay | Admitting: Internal Medicine

## 2009-11-30 ENCOUNTER — Encounter: Payer: Self-pay | Admitting: Internal Medicine

## 2009-12-12 ENCOUNTER — Encounter: Payer: Self-pay | Admitting: Internal Medicine

## 2010-02-15 ENCOUNTER — Telehealth: Payer: Self-pay | Admitting: Internal Medicine

## 2010-03-19 ENCOUNTER — Ambulatory Visit: Payer: Self-pay | Admitting: Internal Medicine

## 2010-03-21 ENCOUNTER — Ambulatory Visit: Payer: Self-pay | Admitting: Hematology and Oncology

## 2010-03-21 ENCOUNTER — Ambulatory Visit: Payer: Self-pay | Admitting: Internal Medicine

## 2010-03-21 LAB — CONVERTED CEMR LAB
ALT: 9 units/L (ref 0–35)
Alkaline Phosphatase: 88 units/L (ref 39–117)
CO2: 22 meq/L (ref 19–32)
Cholesterol: 121 mg/dL (ref 0–200)
Creatinine, Ser: 0.87 mg/dL (ref 0.40–1.20)
LDL Cholesterol: 55 mg/dL (ref 0–99)
Sodium: 143 meq/L (ref 135–145)
Total Bilirubin: 0.3 mg/dL (ref 0.3–1.2)
Total CHOL/HDL Ratio: 3.1
Total Protein: 6.2 g/dL (ref 6.0–8.3)
Triglycerides: 135 mg/dL (ref <150)
VLDL: 27 mg/dL (ref 0–40)

## 2010-03-26 ENCOUNTER — Encounter: Payer: Self-pay | Admitting: Internal Medicine

## 2010-04-25 ENCOUNTER — Ambulatory Visit: Payer: Self-pay | Admitting: Hematology and Oncology

## 2010-05-09 LAB — CBC WITH DIFFERENTIAL/PLATELET
BASO%: 0.3 % (ref 0.0–2.0)
Basophils Absolute: 0 10*3/uL (ref 0.0–0.1)
EOS%: 2.2 % (ref 0.0–7.0)
Eosinophils Absolute: 0.1 10*3/uL (ref 0.0–0.5)
HCT: 34 % — ABNORMAL LOW (ref 34.8–46.6)
HGB: 11.2 g/dL — ABNORMAL LOW (ref 11.6–15.9)
LYMPH%: 25 % (ref 14.0–49.7)
MCH: 28.6 pg (ref 25.1–34.0)
MCHC: 32.9 g/dL (ref 31.5–36.0)
MCV: 86.8 fL (ref 79.5–101.0)
MONO#: 0.3 10*3/uL (ref 0.1–0.9)
MONO%: 6.1 % (ref 0.0–14.0)
NEUT#: 3.7 10*3/uL (ref 1.5–6.5)
NEUT%: 66.4 % (ref 38.4–76.8)
Platelets: 187 10*3/uL (ref 145–400)
RBC: 3.92 10*6/uL (ref 3.70–5.45)
RDW: 14.1 % (ref 11.2–14.5)
WBC: 5.6 10*3/uL (ref 3.9–10.3)
lymph#: 1.4 10*3/uL (ref 0.9–3.3)

## 2010-05-11 LAB — COMPREHENSIVE METABOLIC PANEL
ALT: 8 U/L (ref 0–35)
AST: 14 U/L (ref 0–37)
Albumin: 3.2 g/dL — ABNORMAL LOW (ref 3.5–5.2)
Alkaline Phosphatase: 84 U/L (ref 39–117)
BUN: 20 mg/dL (ref 6–23)
CO2: 21 mEq/L (ref 19–32)
Calcium: 8.2 mg/dL — ABNORMAL LOW (ref 8.4–10.5)
Chloride: 110 mEq/L (ref 96–112)
Creatinine, Ser: 1 mg/dL (ref 0.40–1.20)
Glucose, Bld: 85 mg/dL (ref 70–99)
Potassium: 3.8 mEq/L (ref 3.5–5.3)
Sodium: 142 mEq/L (ref 135–145)
Total Bilirubin: 0.3 mg/dL (ref 0.3–1.2)
Total Protein: 6.5 g/dL (ref 6.0–8.3)

## 2010-05-11 LAB — SPEP & IFE WITH QIG
Albumin ELP: 47.3 % — ABNORMAL LOW (ref 55.8–66.1)
Alpha-1-Globulin: 5.6 % — ABNORMAL HIGH (ref 2.9–4.9)
Alpha-2-Globulin: 12.2 % — ABNORMAL HIGH (ref 7.1–11.8)
Beta 2: 7.8 % — ABNORMAL HIGH (ref 3.2–6.5)
Beta Globulin: 7.7 % — ABNORMAL HIGH (ref 4.7–7.2)
Gamma Globulin: 19.4 % — ABNORMAL HIGH (ref 11.1–18.8)
IgA: 541 mg/dL — ABNORMAL HIGH (ref 68–378)
IgG (Immunoglobin G), Serum: 1310 mg/dL (ref 694–1618)
IgM, Serum: 75 mg/dL (ref 60–263)
Total Protein, Serum Electrophoresis: 6.5 g/dL (ref 6.0–8.3)

## 2010-05-11 LAB — CEA: CEA: 2 ng/mL (ref 0.0–5.0)

## 2010-05-19 ENCOUNTER — Encounter: Payer: Self-pay | Admitting: Gastroenterology

## 2010-05-19 ENCOUNTER — Other Ambulatory Visit: Payer: Self-pay | Admitting: Hematology and Oncology

## 2010-05-19 DIAGNOSIS — D472 Monoclonal gammopathy: Secondary | ICD-10-CM

## 2010-05-20 ENCOUNTER — Encounter: Payer: Self-pay | Admitting: Gastroenterology

## 2010-05-20 ENCOUNTER — Encounter: Payer: Self-pay | Admitting: Hematology and Oncology

## 2010-05-31 NOTE — Letter (Signed)
Summary: [facility]   Imported By: Shon Hough 03/27/2010 11:34:59  _____________________________________________________________________  External Attachment:    Type:   Image     Comment:   External Document
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
## 2010-05-31 NOTE — Progress Notes (Signed)
Summary: phone/gg  Phone Note Call from Patient   Caller: Patient Summary of Call: Pt daughter called and states pt  was seen in clinic for abd pain 4/26.  She has not improved and now has nausea with meals.  onset 1 month ago. BM's normal. still having gas/bloating. She also has a open sore at top of thigh near groin area.  noticed 3 days ago but now is draining.  request appointment on Friday ---  no appointments available but I will watch and put her in first cancellation Initial call taken by: Merrie Roof RN,  Sep 06, 2009 4:18 PM  Follow-up for Phone Call        She was admitted last night to the hospital. Follow-up by: Zoila Shutter MD,  Sep 07, 2009 9:50 AM

## 2010-05-31 NOTE — Discharge Summary (Signed)
Summary: Hospital Discharge Update    Hospital Discharge Update:  Date of Admission: 10/16/2009 Date of Discharge: 10/22/2009  Brief Summary:  Patient was admitted with her typical complaints of nausea, abdominal pain and unable to eat. She was kept NPO and NGT was placed for initial couple of days. Surgery and GI were consulted but with further evaluation not s/o any acute obstruction and improvement with conservative management, she was discharged home. The family did wanted a surgical procudure done this time as this is her 3rd visit in last 6 months for similar problem, they would be given a referral to [facility] by Dr Loreta Ave.  Patient had 6-8 episodes of urination on the day prior to discharge but she said she is asymptomatic with no burning, pain while micturition or fever.  Lab or other results pending at discharge:  UA  Problem list changes:  Removed problem of PERIPHERAL EDEMA (ICD-782.3) Removed problem of ANKLE PAIN, LEFT (ICD-719.47) Removed problem of COLECTOMY, HX OF (ICD-V15.2) Removed problem of COLON CANCER, HX OF (ICD-V10.05) Removed problem of APPENDECTOMY, HX OF (ICD-V45.79) Removed problem of CARPAL TUNNEL RELEASE, HX OF (ICD-V45.89) Removed problem of ARTHROSCOPY, LEFT KNEE, HX OF (ICD-V45.89) Removed problem of POSTPROCEDURAL STATUS NEC (ICD-V45.89) Removed problem of DERMATOPHYTOSIS OF SCALP AND BEARD (ICD-110.0)  Medication list changes:  Removed medication of MAGNESIUM OXIDE 400 MG TABS (MAGNESIUM OXIDE) Take 2 pills by mouth two times a day  The medication, problem, and allergy lists have been updated.  Please see the dictated discharge summary for details.  Discharge medications:  PROTONIX 40 MG TBEC (PANTOPRAZOLE SODIUM) Take 1 tablet by mouth daily. ADULT ASPIRIN EC LOW STRENGTH 81 MG TBEC (ASPIRIN) Take 1 tablet by mouth once a day CYANOCOBALAMIN 1000 MCG/ML SOLN (CYANOCOBALAMIN) Inject IM once monthly. LORAZEPAM 0.5 MG TABS (LORAZEPAM) one pill as  needed for anxiety NYSTATIN 100000 UNIT/GM POWD (NYSTATIN) Apply to affected area 2-3 times each day HYDROCORTISONE 1 % OINT (HYDROCORTISONE) apply to ear 2-4 times per day until rash resolves SERTRALINE HCL 25 MG TABS (SERTRALINE HCL) Take one pill by mouth once daily MIRALAX  POWD (POLYETHYLENE GLYCOL 3350) Take 17 grams dissolved in water by mouth daily (available OTC)  Other patient instructions:  Patient has a follow up appointment with Dr Eben Burow at 4 PM on July 1st 2011. He is requested to coordinate care between GI specialist and her. Also assess for abdominla distention and may add reglan to the meds that she is already on. She also has an appointment with Dr Loreta Ave before July 14th 2011. The office of Dr Loreta Ave will call up if she has an empty spot before that.  Note: Hospital Discharge Medications & Other Instructions handout was printed, one copy for patient and a second copy to be placed in hospital chart.

## 2010-05-31 NOTE — Initial Assessments (Signed)
INTERNAL MEDICINE ADMISSION HISTORY AND PHYSICAL  Attending Dr Coralee Pesa First Contact: Dr Scot Dock [phone] Second Contact: Dr Cena Benton [phone]  PCP: Dr Eben Burow GI: Dr Loreta Ave Surgery: Dr Purnell Shoemaker Hematology: Dr Dalene Carrow DNI/DNR  CC: Abdominal pain and distension  HPI:  Rachel Bridges is an 77 yo F with PMH sig for colon cancer with recurrent SBO's, HLD, Alzheimer's dementia, and depression who presents to the ED today for nausea and obstruction.  Of note Rachel Bridges was admitted 05/13 for the same symptoms. Rachel Bridges's daughter is present and helps give the history. Patient has been having abdominal pain on and off since time of discharge, however the pain worsened over this past weekend. The pain is present in the the entire belly, with no radiation, mild to moderate in intensity, spasmodic in nature, aggravated by food and relieved by itself. No episodes of nausea or vomiting but she does have belching.  She has bowel movements regularly, however they are loose, without frank blood or melena.  Her bowel movements are frequently pale yellow in color. She feels cold sometimes but has not documented any fevers. She denies any associated CP or SOB.  ALLERGIES: NKDA  PAST MEDICAL HISTORY: [facility]   Anxiety Colon cancer, hx of      h/o bowel obstruction in 2001  Depression Diverticulosis, colon Osteopenia Cellulitis left elbow Lumbar spinal stenosis Atrophic vaginitis Renal cyst Dermatitis Hip pain Actinic keratosis Alziemer's Dementia  Hx of hypokalemia Hx of hypomagnesemia  MEDICATIONS: PROTONIX 40 MG TBEC (PANTOPRAZOLE SODIUM) Take 1 tablet by mouth daily. ADULT ASPIRIN EC LOW STRENGTH 81 MG TBEC (ASPIRIN) Take 1 tablet by mouth once a day CYANOCOBALAMIN 1000 MCG/ML SOLN (CYANOCOBALAMIN) Inject IM once monthly. LORAZEPAM 0.5 MG TABS (LORAZEPAM) one pill as needed for anxiety NYSTATIN 100000 UNIT/GM POWD (NYSTATIN) Apply to affected area 2-3 times each  day HYDROCORTISONE 1 % OINT (HYDROCORTISONE) apply to ear 2-4 times per day until rash resolves SERTRALINE HCL 25 MG TABS (SERTRALINE HCL) Take one pill by mouth once daily MAGNESIUM OXIDE 400 MG TABS (MAGNESIUM OXIDE) Take 2 pills by mouth two times a day K-LOR 20 MEQ PACK (POTASSIUM CHLORIDE) Take one pill by mouth once daily for 7 days   SOCIAL HISTORY: Lives with one of her daughters, no smoking, no etoh, no illegal drugs.   Husband had colon cancer in 44's and died of myeloma.   FAMILY HISTORY: non-contributory   ROS: as per HPI  VITALS: T: 98.7  P: 81 BP: 119/70 R:20  O2SAT: 96% ON: RA  PHYSICAL EXAM: General:  alert, well-developed, and cooperative to examination with NG tube on suction draining yellow liquid.   Head:  normocephalic and atraumatic.   Eyes:  vision grossly intact, pupils equal, pupils round, pupils reactive to light, no injection and anicteric.   Mouth:  pharynx pink and moist, no erythema, and no exudates.   Neck:  supple, full ROM, no thyromegaly, no JVD, and no carotid bruits.   Lungs:  normal respiratory effort, no accessory muscle use, normal breath sounds Heart:  normal rate, regular rhythm, 2/6 parasternal systolic murmur, no gallop, and no rub.   Abdomen:  soft, diminished bowel sounds, non-tender, + distension, no guarding, no rebound tenderness, no hepatomegaly, and no splenomegaly.   Msk:  no joint swelling, no joint warmth, and no redness over joints.   Extremities:  no edema Neurologic:  alert & oriented X3, grossly non-focal  LABS:   WBC  10.6       h      4.0-10.5         K/uL  RBC                                      4.43              3.87-5.11        MIL/uL  Hemoglobin (HGB)                         12.8              12.0-15.0        g/dL  Hematocrit (HCT)                         38.6              36.0-46.0        %  MCV                                      87.2              78.0-100.0       fL  MCHC                                      33.0              30.0-36.0        g/dL  RDW                                      14.6              11.5-15.5        %  Platelet Count (PLT)                     210               150-400          K/uL  Neutrophils, %                           88         h      43-77            %  Lymphocytes, %                           8          l      12-46            %  Monocytes, %                             4                 3-12             %  Eosinophils, %  0                 0-5              %  Basophils, %                             0                 0-1              %  Neutrophils, Absolute                    9.3        h      1.7-7.7          K/uL  Lymphocytes, Absolute                    0.8               0.7-4.0          K/uL  Monocytes, Absolute                      0.4               0.1-1.0          K/uL  Eosinophils, Absolute                    0.0               0.0-0.7          K/uL  Basophils, Absolute                      0.0               0.0-0.1          K/uL  Sodium (NA)                              140               135-145          mEq/L  Potassium (K)                            3.7               3.5-5.1          mEq/L  Chloride                                 109               96-112           mEq/L  CO2                                      23                19-32            mEq/L  Glucose  198        h      70-99            mg/dL  BUN                                      20                6-23             mg/dL  Creatinine                               0.96              0.4-1.2          mg/dL  GFR, Est Non African American            56         l      >60              mL/min  GFR, Est African American                >60               >60              mL/min    Oversized comment, see footnote  1  Bilirubin, Total                         0.5               0.3-1.2          mg/dL  Alkaline  Phosphatase                     95                39-117           U/L  SGOT (AST)                               18                0-37             U/L  SGPT (ALT)                               16                0-35             U/L  Total  Protein                           7.3               6.0-8.3          g/dL  Albumin-Blood                            3.0        l      3.5-5.2          g/dL  Calcium                                  8.7               8.4-10.5         mg/dL  Lipase                                   31                11-59            U/L  IMAGING:  Acute abdomen series    IMPRESSION:   No acute cardiopulmonary process.    Marked gaseous distention of large and small bowel is seen on   multiple previous exams.  Given the chronicity, imaging features   are probably related to underlying ileus/chronic dysmotility.   Small bowel distention in the right abdomen on today's study   appears more pronounced than on previous exams and a component of   small bowel obstruction cannot be excluded on this study.   08/2009 1.  Chronic marked gaseous distention of large and small bowel,   appearing little worse than in the prior exam of December 2010. The   findings suggestive of chronic colonic ileus or Ogilvie's syndrome.   2.  Elevation of the right hemidiaphragm with associated   atelectasis.   3.  1.8 cm pulmonary nodule adjacent to the right hilum.  Follow-up   nonemergent chest CT recommended to further assess  CT abdo: Marked colonic dilatation compatible with colonic ileus.    5 mm nodule left lower lobe.  This is unchanged from the CT of   03/09/2008.    ASSESSMENT AND PLAN: 1) SBO- It is unclear whether her obstruction is due to functional ileus or to possible post surgical adhesions.  Her bowels are quite distended on abdominal films and CT scan but no signs of diverticulitis.  We are currently treating conservatively with IVF, making Rachel Bridges NPO, and decompression  with NG tube on suction. Also putting in a rectal tube. Her most recent admission for the same symptoms, in May 2011 resolved spontaneously with conservative management.  In the past admissions Surgeons felt lysis of adhesions would be indicated if Rachel Bridges had a recurrence but patient says that she doesnt want to undergo surgery unless absolutely necessary. No need for antibiotics at this time. She is currently hemodynamically stable.  Will consider surgical consult in AM to discuss frequency of SBO and ileus.   2) Hx of Hypokalemia and hypomagnesemia- We will keep a close watch as this may excacerbate her ileus and we will also check Mg levels.  3 GERD- continue her protonix.  4) DEPRESSION/ANXIETY: Will hold by mouth meds for now. Give IV Ativan as needed.  7)VTE PROPH: Lovenox.   Attending Physician: I performed and/or observed a history and physical examination of the patient.  I discussed the case with the residents as noted and reviewed the residents' notes.  I agree with the findings and plan--please refer to the attending physician note for more details.  Signature  Printed Name

## 2010-05-31 NOTE — Consult Note (Signed)
Summary: [facility]  [facility]   Imported By: Louretta Parma 12/12/2009 15:09:54  _____________________________________________________________________  External Attachment:    Type:   Image     Comment:   External Document
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
## 2010-05-31 NOTE — Progress Notes (Signed)
Summary: Refill/gh  Phone Note Refill Request Message from:  Fax from Pharmacy on June 27, 2009 10:20 AM  Refills Requested: Medication #1:  LORAZEPAM 0.5 MG TABS one pill as needed for anxiety.   Dosage confirmed as above?Dosage Confirmed   Brand Name Necessary? No   Supply Requested: 1 month   Last Refilled: 01/06/2009 Last ofiice vist was 06/05/2009.  Pt was inpt 03/2009.  Medication has been discontinued x 2 .  Attempts to call pt. # has been disconnected.  Pt has a history of anxiety.  Call to pharmacy pt last got med in September 2010.  They do not have another number for pt.   Method Requested: Electronic Initial call taken by: Angelina Ok RN,  June 27, 2009 10:24 AM    Prescriptions: LORAZEPAM 0.5 MG TABS (LORAZEPAM) one pill as needed for anxiety  #30 x 0   Entered and Authorized by:   Lars Mage MD   Signed by:   Lars Mage MD on 06/27/2009   Method used:   Telephoned to ...       [id number]* (retail)       [address] [place], [place]  [address]       Ph: [phone]       Fax: [phone]   RxID:   [id number] LORAZEPAM 0.5 MG TABS (LORAZEPAM) one pill as needed for anxiety  #30 x 0   Entered and Authorized by:   Lars Mage MD   Signed by:   Lars Mage MD on 06/27/2009   Method used:   Telephoned to ...       [id number]* (retail)       [address] [place], [place]  [address]       Ph: [phone]       Fax: [phone]   RxID:   [id number]

## 2010-05-31 NOTE — Progress Notes (Signed)
Summary: med change/gp  Phone Note Refill Request Message from:  Fax from Pharmacy on May 03, 2009 2:17 PM  Refills Requested: Medication #1:  POLYETHYLENE GLYCOL 3350  POWD take 1 packet in 1 glass o water as directed.. Pt.'s insurance will not cover Miralax packets,but will cover the bottles.  Can it be changed to the 527 gm bottle per the pharmacy?   Method Requested: Electronic Initial call taken by: Chinita Pester RN,  May 03, 2009 2:18 PM  Follow-up for Phone Call        It can be changed.  Please enter it into the medication list as such as I am unable to put it in through our current menu.  When done, forward to the Attending desktop and I will sign it.  Thank You. Follow-up by: Doneen Poisson MD,  May 03, 2009 2:55 PM  Additional Follow-up for Phone Call Additional follow up Details #1::        I can not find the 527 gm bottle in our list of alternatives.  Please call the pharmacy to clarify the specific product they have in mind.  When identified I would be happy to write for the product.  Thank You. Additional Follow-up by: Doneen Poisson MD,  May 03, 2009 4:49 PM    New/Updated Medications: POLYETHYLENE GLYCOL 3350  POWD (POLYETHYLENE GLYCOL 3350) take 1 capful in 1 glass of water as directed. Prescriptions: POLYETHYLENE GLYCOL 3350  POWD (POLYETHYLENE GLYCOL 3350) take 1 capful in 1 glass of water as directed.  #527 GRAMS x 3   Entered and Authorized by:   Doneen Poisson MD   Signed by:   Doneen Poisson MD on 05/03/2009   Method used:   Electronically to        [id number] (retail)       [address] [place], [place]  [address]       Ph: [phone]       Fax: [phone]   RxID:   [id number]

## 2010-05-31 NOTE — Assessment & Plan Note (Signed)
Summary: est-ck/fu/meds/cfb   Vital Signs:  Patient profile:   77 year old female Height:      67 inches (170.18 cm) Weight:      128.5 pounds (58.41 kg) BMI:     20.20 Temp:     69 degrees F (20.56 degrees C) oral Pulse rate:   69 / minute BP sitting:   112 / 70  (right arm) Cuff size:   regular  Vitals Entered By: Theotis Barrio NT II (November 08, 2009 4:06 PM) CC: MEDICATION REFILL / ROUTINE OFFICE VISIT  /  HOSPITAL [facility] APPT Is Patient Diabetic? No Pain Assessment Patient in pain? no      Nutritional Status BMI of 19 -24 = normal  Have you ever been in a relationship where you felt threatened, hurt or afraid?No   Does patient need assistance? Functional Status Self care Ambulation Normal Comments MEDICATION REFILL / HOSPITAL HOSPITAL   Primary Care Provider:  Lars Mage MD  CC:  MEDICATION REFILL / ROUTINE OFFICE VISIT  /  HOSPITAL [facility] UP APPT.  History of Present Illness: Patient is 77 year old patient of mine who is here today for medication refill and hospitalfollow up.  She has recurent abdominal obstructions with 3 admissions in last 7 months.  She has an appointment with Dr Loreta Ave tomorrow and she is to refer her to [facility] to a motility expert.  She has swelling in her lower ext which aggravtes through out the day and relieved by rest and keeping her legs up. She is no meds that will cause this. She uses knee length stockings.  No other complaints at thios time.  She came to clinic independtly today without use of any walker and is in good spirits.  Preventive Screening-Counseling & Management  Alcohol-Tobacco     Alcohol drinks/day: 0     Smoking Status: quit     Year Quit: 55yrs ago  Caffeine-Diet-Exercise     Diet Counseling: not indicated; diet is assessed to be healthy     Does Patient Exercise: no  Problems Prior to Update: 1)  Ileus  (ICD-560.1) 2)  Hypomagnesemia  (ICD-275.2) 3)  Hypokalemia  (ICD-276.8) 4)  Dermatitis,  Seborrheic  (ICD-690.10) 5)  Fungal Dermatitis  (ICD-111.9) 6)  Skin Tag  (ICD-701.9) 7)  Abdominal Distension  (ICD-787.3) 8)  Fatigue  (ICD-780.79) 9)  Gait Imbalance  (ICD-781.2) 10)  Monoclonal Gammopathy  (ICD-273.1) 11)  Weight Loss  (ICD-783.21) 12)  Vitamin B12 Deficiency  (ICD-266.2) 13)  Vitamin D Deficiency  (ICD-268.9) 14)  Postural Hypotension  (ICD-458.0) 15)  Gerd  (ICD-530.81) 16)  Allergic Rhinitis, Seasonal  (ICD-477.0) 17)  Dementia  (ICD-294.8) 18)  Preventive Health Care  (ICD-V70.0) 19)  Psoriasis  (ICD-696.1) 20)  Bowel Obstruction  (ICD-560.9) 21)  Actinic Keratosis  (ICD-702.0) 22)  Dermatitis  (ICD-692.9) 23)  Renal Cyst  (ICD-593.2) 24)  Spinal Stenosis, Lumbar  (ICD-724.02) 25)  Laminectomy, Lumbar, Hx of  (ICD-V45.89) 26)  Cataract Extraction, Left Eye, Hx of  (ICD-V45.61) 27)  Ovarian Cystectomy, Hx of  (ICD-V45.89) 28)  Osteopenia  (ICD-733.90) 29)  Hyperlipidemia  (ICD-272.4) 30)  Diverticulosis, Colon  (ICD-562.10) 31)  Depression  (ICD-311) 32)  Anxiety  (ICD-300.00)  Medications Prior to Update: 1)  Protonix 40 Mg Tbec (Pantoprazole Sodium) .... Take 1 Tablet By Mouth Daily. 2)  Adult Aspirin Ec Low Strength 81 Mg Tbec (Aspirin) .... Take 1 Tablet By Mouth Once A Day 3)  Cyanocobalamin 1000 Mcg/ml  Soln (Cyanocobalamin) .... Inject Im Once Monthly. 4)  Lorazepam 0.5 Mg Tabs (Lorazepam) .... One Pill As Needed For Anxiety 5)  Nystatin 100000 Unit/gm Powd (Nystatin) .... Apply To Affected Area 2-3 Times Each Day 6)  Hydrocortisone 1 % Oint (Hydrocortisone) .... Apply To Ear 2-4 Times Per Day Until Rash Resolves 7)  Sertraline Hcl 25 Mg Tabs (Sertraline Hcl) .... Take One Pill By Mouth Once Daily 8)  Miralax  Powd (Polyethylene Glycol 3350) .... Take 17 Grams Dissolved in Water By Mouth Daily (Available Otc)  Current Medications (verified): 1)  Protonix 40 Mg Tbec (Pantoprazole Sodium) .... Take 1 Tablet By Mouth Daily. 2)  Adult  Aspirin Ec Low Strength 81 Mg Tbec (Aspirin) .... Take 1 Tablet By Mouth Once A Day 3)  Cyanocobalamin 1000 Mcg/ml Soln (Cyanocobalamin) .... Inject Im Once Monthly. 4)  Lorazepam 0.5 Mg Tabs (Lorazepam) .... One Pill As Needed For Anxiety 5)  Sertraline Hcl 25 Mg Tabs (Sertraline Hcl) .... Take One Pill By Mouth Once Daily  Allergies (verified): No Known Drug Allergies  Past History:  Past Medical History: Last updated: 07/01/2008 [facility]   Anxiety Colon cancer, hx of      h/o bowel obstruction Rx with colace and miralax (Dr. Loreta Ave & Dr. Purnell Shoemaker) Depression Diverticulosis, colon Osteopenia Cellulitis left elbow Lumbar spinal stenosis Atrophic vaginitis Vaginitis Renal cyst Tobacco abuse Dermatitis Hip pain Actinic keratosis Dementia  Past Surgical History: Last updated: 03/24/2006 Hemicolectomy- Right side Resection of ovearian cyst Cataract removal- left eye Arthroscopy of left knee Appendectomy Carpal tunnel release Lumbar laminectomy Lumbar fusion  Family History: Last updated: 09/24/2006 Patient has 2 daughters, one of whom she lives with  Social History: Last updated: 02/16/2008 Lives with one of her daughters, no smoking, no etoh, no illegal drugs.  Husband died of myeloma.  Risk Factors: Alcohol Use: 0 (11/08/2009) Exercise: no (11/08/2009)  Risk Factors: Smoking Status: quit (11/08/2009)  Family History: Reviewed history from 09/24/2006 and no changes required. Patient has 2 daughters, one of whom she lives with  Social History: Reviewed history from 02/16/2008 and no changes required. Lives with one of her daughters, no smoking, no etoh, no illegal drugs.  Husband died of myeloma.  Review of Systems      See HPI  Physical Exam  Additional Exam:  Gen: AOx3, in no acute distress Eyes: PERRL, EOMI ENT:MMM, No erythema noted in posterior pharynx Neck: No JVD, No LAP Chest: CTAB  with  good respiratory effort CVS: regular rhythmic rate, NO M/R/G, S1 S2 normal Abdo: soft,ND, BS+x4, Non tender and No hepatosplenomegaly EXT: 1+pitting edema noted b/l upto ankles. Neuro: Non focal, gait is normal Skin: no rashes noted.    Impression & Recommendations:  Problem # 1:  ILEUS (ICD-560.1) Assessment Improved Patient will be referred to Motility expert in [facility] for further management. No new symptoms at this time and she is tolerating her food well.  Problem # 2:  VITAMIN B12 DEFICIENCY (ICD-266.2) Assessment: Unchanged Continue B12 injections.  Problem # 3:  GAIT IMBALANCE (ICD-781.2) Assessment: Improved Patient came in today without any walker or cane and was maintianing her balance well. I advised her use atleast a cane at all times but she disagrees and said she will use it if and when required.  Problem # 4:  BOWEL OBSTRUCTION (ICD-560.9) Assessment: Improved Tolearting food well and bowel movements evryday. No abdominal pain noted. Will follow up with Dr Loreta Ave tomorrow.  Problem # 5:  DEPRESSION (ICD-311) Assessment: Improved Continue current meds. Will refill today. Her updated medication list for this problem includes:    Lorazepam 0.5 Mg Tabs (Lorazepam) ..... One pill as needed for anxiety    Sertraline Hcl 25 Mg Tabs (Sertraline hcl) .Marland Kitchen... Take one pill by mouth once daily  Discussed treatment options, including trial of antidpressant medication. Will refer to behavioral health. Follow-up call in in 24-48 hours and recheck in 2 weeks, sooner as needed. Patient agrees to call if any worsening of symptoms or thoughts of doing harm arise. Verified that the patient has no suicidal ideation at this time.   Complete Medication List: 1)  Protonix 40 Mg Tbec (Pantoprazole sodium) .... Take 1 tablet by mouth daily. 2)  Adult Aspirin Ec Low Strength 81 Mg Tbec (Aspirin) .... Take 1 tablet by mouth once a day 3)  Cyanocobalamin 1000 Mcg/ml Soln (Cyanocobalamin)  .... Inject im once monthly. 4)  Lorazepam 0.5 Mg Tabs (Lorazepam) .... One pill as needed for anxiety 5)  Sertraline Hcl 25 Mg Tabs (Sertraline hcl) .... Take one pill by mouth once daily  Patient Instructions: 1)  Please schedule a follow-up appointment in 3 months. 2)  Limit your Sodium (Salt). 3)  It is important that you exercise regularly at least 20 minutes 5 times a week. If you develop chest pain, have severe difficulty breathing, or feel very tired , stop exercising immediately and seek medical attention. 4)  Take an Aspirin every day. 5)  Check your Blood Pressure regularly. If it is above: 140/90  you should make an appointment. Prescriptions: PROTONIX 40 MG TBEC (PANTOPRAZOLE SODIUM) Take 1 tablet by mouth daily.  #30 x 3   Entered and Authorized by:   Lars Mage MD   Signed by:   Lars Mage MD on 11/08/2009   Method used:   Print then Give to Patient   RxID:   [id number] LORAZEPAM 0.5 MG TABS (LORAZEPAM) one pill as needed for anxiety  #30 x 0   Entered and Authorized by:   Lars Mage MD   Signed by:   Lars Mage MD on 11/08/2009   Method used:   Print then Give to Patient   RxID:   [id number] SERTRALINE HCL 25 MG TABS (SERTRALINE HCL) Take one pill by mouth once daily  #31 x 1   Entered and Authorized by:   Lars Mage MD   Signed by:   Lars Mage MD on 11/08/2009   Method used:   Print then Give to Patient   RxID:   [id number] ADULT ASPIRIN EC LOW STRENGTH 81 MG TBEC (ASPIRIN) Take 1 tablet by mouth once a day  #30 x prn   Entered and Authorized by:   Lars Mage MD   Signed by:   Lars Mage MD on 11/08/2009   Method used:   Print then Give to Patient   RxID:   [id number]    Prevention & Chronic Care Immunizations   Influenza vaccine: Fluvax 3+  (04/12/2009)   Influenza vaccine deferral: Deferred  (11/08/2009)    Tetanus booster: 05/01/2009: Td    Pneumococcal vaccine: Not documented   Pneumococcal vaccine deferral: Not indicated   (06/05/2009)    H. zoster vaccine: Not documented   H. zoster vaccine deferral: Deferred  (11/08/2009)  Colorectal Screening   Hemoccult: Not documented   Hemoccult action/deferral: Not indicated  (01/05/2009)    Colonoscopy: Not documented   Colonoscopy action/deferral: Not indicated  (01/05/2009)  Other  Screening   Pap smear: Not documented   Pap smear action/deferral: Not indicated-other  (01/05/2009)    Mammogram: Not documented   Mammogram action/deferral: Not indicated  (01/05/2009)    DXA bone density scan: Not documented   DXA bone density action/deferral: Not indicated  (01/05/2009)   Smoking status: quit  (11/08/2009)  Lipids   Total Cholesterol: 127  (10/20/2008)   Lipid panel action/deferral: Deferred   LDL: 60  (10/20/2008)   LDL Direct: Not documented   HDL: 34  (10/20/2008)   Triglycerides: 165  (10/20/2008)    SGOT (AST): 10  (10/20/2008)   BMP action: Deferred   SGPT (ALT): <8 U/L  (10/20/2008)   Alkaline phosphatase: 81  (10/20/2008)   Total bilirubin: 0.3  (10/20/2008)    Lipid flowsheet reviewed?: Yes   Progress toward LDL goal: At goal  Self-Management Support :   Personal Goals (by the next clinic visit) :      Personal LDL goal: 100  (01/05/2009)    Patient will work on the following items until the next clinic visit to reach self-care goals:     Medications and monitoring: take my medicines every day, bring all of my medications to every visit  (11/08/2009)     Eating: use fresh or frozen vegetables  (11/08/2009)     Activity: take a 30 minute walk every day  (11/08/2009)    Lipid self-management support: Written self-care plan  (11/08/2009)   Lipid self-care plan printed.    Lipid self-management support not done because: Good outcomes  (05/01/2009)

## 2010-05-31 NOTE — Miscellaneous (Signed)
Summary: hospital admission  INTERNAL MEDICINE ADMISSION HISTORY AND PHYSICAL  Attending Dr Rogelia Boga First Contact: Dr Darin Engels [phone] Second Contact: Dr Gwenlyn Perking [phone]  PCP: Dr Eben Burow GI: Dr Loreta Ave Surgery: Dr Purnell Shoemaker Hematology: Dr Dalene Carrow DNI/DNR  CC: Abdominal pain and distension  HPI:  Pt is an 77 yo F with PMH sig for colon cancer with recurrent SBO's, HLD, Alzheimer's dementia, and depression who presents to the ED today for nausea and obstruction.  Of note pt was seen in the clinic 2 weeks ago for abdominal pain and distention.  Pt's daughter is present and helps give the history. Patient has been having abdominal pain on and off for last 4 weeks now. The pain is present in the the entire belly, with no radiation, mild to moderate in intensity, spasmodic in nature, aggravated by food and relieved by itself. No episodes of nausea or vomiting but she does have belching. The pain has been a/w large increase in her abdominal distension. These pain episodes have been incresing in freq and intensity until yesterday night when the pain was 10/10 and they decided to come to ED. She had a bowel movement yesterday that was reportedly normal without frank blood or melena.  Her bowel movements are frequently pale yellow in color. She feels cold sometimes but has not documented any fevers.  She has had some recent increased swelling in her lower extremities, which she has benn there chronically. She denies any associated CP or SOB.  ALLERGIES: NKDA  PAST MEDICAL HISTORY: MGUS [facility]   Anxiety Colon cancer, hx of      h/o bowel obstruction in 2001  Depression Diverticulosis, colon Osteopenia Cellulitis left elbow Lumbar spinal stenosis Atrophic vaginitis Renal cyst Dermatitis Hip pain Actinic keratosis Alziemer's Dementia  Hx of hypokalemia Hx of hypomagnesemia  MEDICATIONS: PROTONIX 40 MG TBEC (PANTOPRAZOLE SODIUM) Take 1 tablet by mouth  daily. ADULT ASPIRIN EC LOW STRENGTH 81 MG TBEC (ASPIRIN) Take 1 tablet by mouth once a day CYANOCOBALAMIN 1000 MCG/ML SOLN (CYANOCOBALAMIN) Inject IM once monthly. LORAZEPAM 0.5 MG TABS (LORAZEPAM) one pill as needed for anxiety NYSTATIN 100000 UNIT/GM POWD (NYSTATIN) Apply to affected area 2-3 times each day HYDROCORTISONE 1 % OINT (HYDROCORTISONE) apply to ear 2-4 times per day until rash resolves SERTRALINE HCL 25 MG TABS (SERTRALINE HCL) Take one pill by mouth once daily MAGNESIUM OXIDE 400 MG TABS (MAGNESIUM OXIDE) Take 2 pills by mouth two times a day K-LOR 20 MEQ PACK (POTASSIUM CHLORIDE) Take one pill by mouth once daily for 7 days   SOCIAL HISTORY: Lives with one of her daughters, no smoking, no etoh, no illegal drugs.   Husband had colon cancer in 12's and died of myeloma.   FAMILY HISTORY: non-contributory   ROS: as per HPI  VITALS: T: 97.7  P: 63 BP: 137/68 R:20  O2SAT: 97% ON: RA  PHYSICAL EXAM: General:  alert, well-developed, and cooperative to examination with NG tube on suction draining yellow liquid.   Head:  normocephalic and atraumatic.   Eyes:  vision grossly intact, pupils equal, pupils round, pupils reactive to light, no injection and anicteric.   Mouth:  pharynx pink and moist, no erythema, and no exudates.   Neck:  supple, full ROM, no thyromegaly, no JVD, and no carotid bruits.   Lungs:  normal respiratory effort, no accessory muscle use, normal breath sounds, very mild bibasilar crackles, and no wheezes.  Heart:  normal rate, regular rhythm,  3/6 parasternal systolic murmur, no gallop, and no rub.   Abdomen:  soft, BS increased, non-tender, + distension, normal bowel sounds, no guarding, no rebound tenderness, no hepatomegaly, and no splenomegaly.   Msk:  no joint swelling, no joint warmth, and no redness over joints.   Extremities:  No cyanosis, clubbing, 1+ pitting edema up to mid shins bilaterally, 2X2 superficial erosion and erythema over left  anteromedial groin region. Neurologic:  alert & oriented X3, grossly non-focal Psych:  Oriented X3, memory intact for recent and remote, normally interactive, good eye contact, not anxious appearing, and not depressed appearing.   LABS: WBC                                      6.8               4.0-10.5         K/uL  RBC                                      4.24              3.87-5.11        MIL/uL  Hemoglobin (HGB)                         12.1              12.0-15.0        g/dL  Hematocrit (HCT)                         37.1              36.0-46.0        %  MCV                                      87.5              78.0-100.0       fL  MCHC                                     32.7              30.0-36.0        g/dL  RDW                                      14.6              11.5-15.5        %  Platelet Count (PLT)                     187               150-400          K/uL  Neutrophils, %                           72  43-77            %  Lymphocytes, %                           21                12-46            %  Monocytes, %                             6                 3-12             %  Eosinophils, %                           1                 0-5              %  Basophils, %                             0                 0-1              %  Neutrophils, Absolute                    4.9               1.7-7.7          K/uL  Lymphocytes, Absolute                    1.4               0.7-4.0          K/uL  Monocytes, Absolute                      0.4               0.1-1.0          K/uL  Eosinophils, Absolute                    0.0               0.0-0.7          K/uL  Basophils, Absolute                      0.0               0.0-0.1          K/uL  Sodium (NA)                              140               135-145          mEq/L  Potassium (K)                            4.0  3.5-5.1          mEq/L  Chloride                                 106               96-112            mEq/L  CO2                                      29                19-32            mEq/L  Glucose                                  109        h      70-99            mg/dL  BUN                                      13                6-23             mg/dL  Creatinine                               0.91              0.4-1.2          mg/dL  GFR, Est Non African American            59         l      >60              mL/min  GFR, Est African American                >60               >60              mL/min    Oversized comment, see footnote  1  Bilirubin, Total                         0.4               0.3-1.2          mg/dL  Alkaline Phosphatase                     87                39-117           U/L  SGOT (AST)                               14                0-37  U/L  SGPT (ALT)                               11                0-35             U/L  Total  Protein                           7.2               6.0-8.3          g/dL  Albumin-Blood                            3.0        l      3.5-5.2          g/dL  Calcium                                  8.9               8.4-10.5         mg/dL   Lipase                                   38                11-59            U/L  Color, Urine                             YELLOW            YELLOW  Appearance                               CLOUDY     a      CLEAR  Specific Gravity                         1.036      h      1.005-1.030  pH                                       5.5               5.0-8.0  Urine Glucose                            NEGATIVE          NEG              mg/dL  Bilirubin                                NEGATIVE          NEG  Ketones  NEGATIVE          NEG              mg/dL  Blood                                    TRACE      a      NEG  Protein                                  NEGATIVE          NEG              mg/dL  Urobilinogen                             1.0                0.0-1.0          mg/dL  Nitrite                                  NEGATIVE          NEG  Leukocytes                               SMALL      a      NEG  Squamous Epithelial / LPF                FEW        a      RARE  Urine Crystals                           SEE NOTE.  a      NEG    CA OXALATE CRYSTALS  WBC / HPF                                11-20             <3               WBC/hpf  RBC / HPF                                3-6               <3               RBC/hpf  Bacteria / HPF                           RARE              RARE  IMAGING: Acute abdomen series   1.  Chronic marked gaseous distention of large and small bowel,   appearing little worse than in the prior exam of December 2010. The   findings suggestive of chronic colonic ileus or Ogilvie's syndrome.   2.  Elevation of the right hemidiaphragm with associated   atelectasis.   3.  1.8  cm pulmonary nodule adjacent to the right hilum.  Follow-up   nonemergent chest CT recommended to further assess  CT abdo: Marked colonic dilatation compatible with colonic ileus.    5 mm nodule left lower lobe.  This is unchanged from the CT of   03/09/2008.    ASSESSMENT AND PLAN: 1) SBO- It is unclear whether her obstruction is due to functional ileus or to possible post surgical adhesions.  Her bowels are quite distended on abdominal films and CT scan but no signs of diverticulitis.  We are currently treating conservatively with IVF, making pt NPO, and decompression with NG tube on suction.  Her most recent admission for the same was Dec of last year which resolved spontaneously with conservative management.  In the past admissions Surgeons felt lysis of adhesions would be indicated if pt had a recurrence but patient says that she doesnt want to undergo surgery unless absolutely necessary. No need for antibiotics at this time. She is currently hemodynamically stable.  2) Asymtomatic UTI: No treatment indicated at this time. 3) Hx of  Hypokalemia and hypomagnesemia- We will keep a close waych as this may excacerbate her ileus and we will also check Mg levels. 4) LE edema- this is likely 2/2 hypoalbuminemia and chronic.  Pt may benefit from outpt echo at some point. 5) GERD- continue her protonix, can be given IV. 6)DEPRESSION/ANXIETY: Will hold by mouth meds for now. Give IV Ativan as needed. 7)VTE PROPH: Lovenox. 8) Left groin wound: Will consult wound care. It is a superficial erosion of skin and does not look infected at this time.  Attending Physician: I performed and/or observed a history and physical examination of the patient.  I discussed the case with the residents as noted and reviewed the residents' notes.  I agree with the findings and plan--please refer to the attending physician note for more details.  Signature  Printed Name

## 2010-05-31 NOTE — Discharge Summary (Signed)
Summary: Hospital Discharge Update    Hospital Discharge Update:  Date of Admission: 09/07/2009 Date of Discharge: 09/11/2009  Brief Summary:  Pt was admitted with N/V/abdominal pain secondary to ileus.  She was treated conservatively with NPO, NG suction and improved clinically over 3 days.  She was tolerating full diet and was having bowel movements regularly by time of DC.  Labs needed at follow-up: Basic metabolic panel  Other labs needed at follow-up: Magnesium Phosphorus  Other follow-up issues:  Please make sure the patient has not had recurrence of her nausea, vomiting, or abdominal pain.  Make sure she has continued to tolerate a regular diet.  Problem list changes:  Added new problem of ILEUS (ICD-560.1) - Signed  Medication list changes:  Removed medication of K-LOR 20 MEQ PACK (POTASSIUM CHLORIDE) Take one pill by mouth once daily for 7 days - Signed Added new medication of MIRALAX  POWD (POLYETHYLENE GLYCOL 3350) Take 17 grams dissolved in water by mouth daily (available OTC) - Signed  The medication, problem, and allergy lists have been updated.  Please see the dictated discharge summary for details.  Discharge medications:  PROTONIX 40 MG TBEC (PANTOPRAZOLE SODIUM) Take 1 tablet by mouth daily. ADULT ASPIRIN EC LOW STRENGTH 81 MG TBEC (ASPIRIN) Take 1 tablet by mouth once a day CYANOCOBALAMIN 1000 MCG/ML SOLN (CYANOCOBALAMIN) Inject IM once monthly. LORAZEPAM 0.5 MG TABS (LORAZEPAM) one pill as needed for anxiety NYSTATIN 100000 UNIT/GM POWD (NYSTATIN) Apply to affected area 2-3 times each day HYDROCORTISONE 1 % OINT (HYDROCORTISONE) apply to ear 2-4 times per day until rash resolves SERTRALINE HCL 25 MG TABS (SERTRALINE HCL) Take one pill by mouth once daily MAGNESIUM OXIDE 400 MG TABS (MAGNESIUM OXIDE) Take 2 pills by mouth two times a day MIRALAX  POWD (POLYETHYLENE GLYCOL 3350) Take 17 grams dissolved in water by mouth daily (available OTC)  Other  patient instructions:  Please come to your follow up appointment with Dr. Eben Burow on June 13th at 3 pm. You will start taking Miralax (which is available over the counter) daily to keep your bowels moving. If you have recurrence of nausea, vomiting, or abdominal pain call the clinic or come back to the ED.  Note: Hospital Discharge Medications & Other Instructions handout was printed, one copy for patient and a second copy to be placed in hospital chart.

## 2010-05-31 NOTE — Assessment & Plan Note (Signed)
Summary: checkup/pcp-Keitha Kolk/hla   Vital Signs:  Patient profile:   77 year old female Height:      67 inches (170.18 cm) Weight:      133.1 pounds (60.50 kg) BMI:     20.92 Temp:     97.4 degrees F (36.33 degrees C) oral Pulse rate:   69 / minute BP sitting:   120 / 79  (right arm)  Vitals Entered By: [name] Ditzler RN (June 05, 2009 9:47 AM) Is Patient Diabetic? No Pain Assessment Patient in pain? yes     Location: upper abd Intensity: 8 Type: aching Onset of pain  past 1 1/2 week Nutritional Status BMI of 19 -24 = normal Nutritional Status Detail appetite good  Have you ever been in a relationship where you felt threatened, hurt or afraid?denies   Does patient need assistance? Functional Status Self care Ambulation Normal   Primary Care Provider:  Lars Mage MD   History of Present Illness: 77 year old women with PMH as described in EMR is here today for a follow up appointmnet of her weight loss.  We stopped her erythromycin last time around and also started her on Mirtazepine for increased appetite. She comes in today with 3 pound documente dweight gain in last 3 weeks, increased appetite and feeling stronger. She also complains of having some anxiety episodes.  She has anxiety disorder at baseline but has not been taking his mirtazepine or any other meds for that. No other complaint.  Depression History:      The patient denies a depressed mood most of the day and a diminished interest in her usual daily activities.         Preventive Screening-Counseling & Management  Alcohol-Tobacco     Alcohol drinks/day: 0     Smoking Status: quit     Year Quit: 69yrs ago  Caffeine-Diet-Exercise     Diet Counseling: not indicated; diet is assessed to be healthy     Does Patient Exercise: no  Problems Prior to Update: 1)  Ankle Pain, Left  (ICD-719.47) 2)  Gait Imbalance  (ICD-781.2) 3)  Monoclonal Gammopathy  (ICD-273.1) 4)  Weight Loss  (ICD-783.21) 5)  Vitamin  B12 Deficiency  (ICD-266.2) 6)  Vitamin D Deficiency  (ICD-268.9) 7)  Postural Hypotension  (ICD-458.0) 8)  Gerd  (ICD-530.81) 9)  Edema Leg  (ICD-782.3) 10)  Allergic Rhinitis, Seasonal  (ICD-477.0) 11)  Dementia  (ICD-294.8) 12)  Preventive Health Care  (ICD-V70.0) 13)  Dermatophytosis of Scalp and Beard  (ICD-110.0) 14)  Psoriasis  (ICD-696.1) 15)  Bowel Obstruction  (ICD-560.9) 16)  Actinic Keratosis  (ICD-702.0) 17)  Dermatitis  (ICD-692.9) 18)  Renal Cyst  (ICD-593.2) 19)  Spinal Stenosis, Lumbar  (ICD-724.02) 20)  Postprocedural Status Nec  (ICD-V45.89) 21)  Laminectomy, Lumbar, Hx of  (ICD-V45.89) 22)  Carpal Tunnel Release, Hx of  (ICD-V45.89) 23)  Appendectomy, Hx of  (ICD-V45.79) 24)  Arthroscopy, Left Knee, Hx of  (ICD-V45.89) 25)  Cataract Extraction, Left Eye, Hx of  (ICD-V45.61) 26)  Ovarian Cystectomy, Hx of  (ICD-V45.89) 27)  Colectomy, Hx of  (ICD-V15.2) 28)  Osteopenia  (ICD-733.90) 29)  Hyperlipidemia  (ICD-272.4) 30)  Diverticulosis, Colon  (ICD-562.10) 31)  Depression  (ICD-311) 32)  Colon Cancer, Hx of  (ICD-V10.05) 33)  Anxiety  (ICD-300.00)  Medications Prior to Update: 1)  Protonix 40 Mg Tbec (Pantoprazole Sodium) .... Take 1 Tablet By Mouth Daily. 2)  Mirtazapine 30 Mg Tabs (Mirtazapine) .... Take 1 Tablet By Mouth  At Bedtime. 3)  Adult Aspirin Ec Low Strength 81 Mg Tbec (Aspirin) .... Take 1 Tablet By Mouth Once A Day 4)  Cyanocobalamin 1000 Mcg/ml Soln (Cyanocobalamin) .... Inject Im Once Monthly. 5)  Metoclopramide Hcl 5 Mg Tabs (Metoclopramide Hcl) .... Take 1 Tab By Mouth As Needed For Nausea. 6)  Polyethylene Glycol 3350  Powd (Polyethylene Glycol 3350) .... Take 1 Capful in 1 Glass of Water As Directed.  Current Medications (verified): 1)  Protonix 40 Mg Tbec (Pantoprazole Sodium) .... Take 1 Tablet By Mouth Daily. 2)  Adult Aspirin Ec Low Strength 81 Mg Tbec (Aspirin) .... Take 1 Tablet By Mouth Once A Day 3)  Cyanocobalamin 1000  Mcg/ml Soln (Cyanocobalamin) .... Inject Im Once Monthly. 4)  Metoclopramide Hcl 5 Mg Tabs (Metoclopramide Hcl) .... Take 1 Tab By Mouth As Needed For Nausea. 5)  Polyethylene Glycol 3350  Powd (Polyethylene Glycol 3350) .... Take 1 Capful in 1 Glass of Water As Directed. 6)  Lorazepam 0.5 Mg Tabs (Lorazepam) .... One Pill As Needed For Anxiety  Allergies (verified): No Known Drug Allergies  Past History:  Past Medical History: Last updated: 07/01/2008 [facility]   Anxiety Colon cancer, hx of      h/o bowel obstruction Rx with colace and miralax (Dr. Loreta Ave & Dr. Purnell Shoemaker) Depression Diverticulosis, colon Osteopenia Cellulitis left elbow Lumbar spinal stenosis Atrophic vaginitis Vaginitis Renal cyst Tobacco abuse Dermatitis Hip pain Actinic keratosis Dementia  Past Surgical History: Last updated: 03/24/2006 Hemicolectomy- Right side Resection of ovearian cyst Cataract removal- left eye Arthroscopy of left knee Appendectomy Carpal tunnel release Lumbar laminectomy Lumbar fusion  Family History: Last updated: 09/24/2006 Patient has 2 daughters, one of whom she lives with  Social History: Last updated: 02/16/2008 Lives with one of her daughters, no smoking, no etoh, no illegal drugs.  Husband died of myeloma.  Risk Factors: Alcohol Use: 0 (06/05/2009) Exercise: no (06/05/2009)  Risk Factors: Smoking Status: quit (06/05/2009)  Review of Systems      See HPI  Physical Exam  Additional Exam:  Gen: AOx3, in no acute distress Eyes: PERRL, EOMI ENT:MMM, No erythema noted in posterior pharynx Neck: No JVD, No LAP Chest: CTAB with  good respiratory effort CVS: regular rhythmic rate, NO M/R/G, S1 S2 normal Abdo: soft,ND, BS+x4, Non tender and No hepatosplenomegaly EXT: No odema noted Neuro: Non focal, gait is normal Skin: no rashes noted.    Impression & Recommendations:  Problem # 1:  WEIGHT LOSS  (ICD-783.21) Patient has started gaining weight since alst visit. we will continue to monitor her weight. it was most luikely her erythromycin which was causing her diarrhoea and her weight loss. She still complains of some GERD symptoms and we will use reglan in future for incresing gastric motility.  Problem # 2:  VITAMIN B12 DEFICIENCY (ICD-266.2) continuing Vit B12 injections.  Problem # 3:  ANXIETY (ICD-300.00) Patient was given a [prscrioption of Ativan. We will consider starting her on Zoloft on her next visit if her symptoms continue to worsen. She had been on and off multiple SSRI's in the past. The following medications were removed from the medication list:    Mirtazapine 30 Mg Tabs (Mirtazapine) .Marland Kitchen... Take 1 tablet by mouth at bedtime. Her updated medication list for this problem includes:    Lorazepam 0.5 Mg Tabs (Lorazepam) ..... One pill as needed for anxiety  Discussed medication use and relaxation techniques.   Problem # 4:  GERD (ICD-530.81) Continue same meds. Her updated medication list for this problem includes:    Protonix 40 Mg Tbec (Pantoprazole sodium) .Marland Kitchen... Take 1 tablet by mouth daily.  Labs Reviewed: Hgb: 12.3 (04/12/2009)   Hct: 37.3 (04/12/2009)  Complete Medication List: 1)  Protonix 40 Mg Tbec (Pantoprazole sodium) .... Take 1 tablet by mouth daily. 2)  Adult Aspirin Ec Low Strength 81 Mg Tbec (Aspirin) .... Take 1 tablet by mouth once a day 3)  Cyanocobalamin 1000 Mcg/ml Soln (Cyanocobalamin) .... Inject im once monthly. 4)  Metoclopramide Hcl 5 Mg Tabs (Metoclopramide hcl) .... Take 1 tab by mouth as needed for nausea. 5)  Polyethylene Glycol 3350 Powd (Polyethylene glycol 3350) .... Take 1 capful in 1 glass of water as directed. 6)  Lorazepam 0.5 Mg Tabs (Lorazepam) .... One pill as needed for anxiety  Patient Instructions: 1)  Please schedule a follow-up appointment in 6 months. 2)  Please schedule a follow-up appointment as needed. 3)   It is important that you exercise regularly at least 20 minutes 5 times a week. If you develop chest pain, have severe difficulty breathing, or feel very tired , stop exercising immediately and seek medical attention. 4)  Take an Aspirin every day. Prescriptions: LORAZEPAM 0.5 MG TABS (LORAZEPAM) one pill as needed for anxiety  #30 x 0   Entered and Authorized by:   Lars Mage MD   Signed by:   Lars Mage MD on 06/05/2009   Method used:   Handwritten   RxID:   [id number]   Prevention & Chronic Care Immunizations   Influenza vaccine: Fluvax 3+  (04/12/2009)   Influenza vaccine deferral: Not available  (01/05/2009)    Tetanus booster: 05/01/2009: Td    Pneumococcal vaccine: Not documented   Pneumococcal vaccine deferral: Not indicated  (06/05/2009)    H. zoster vaccine: Not documented   H. zoster vaccine deferral: Deferred  (06/05/2009)  Colorectal Screening   Hemoccult: Not documented   Hemoccult action/deferral: Not indicated  (01/05/2009)    Colonoscopy: Not documented   Colonoscopy action/deferral: Not indicated  (01/05/2009)  Other Screening   Pap smear: Not documented   Pap smear action/deferral: Not indicated-other  (01/05/2009)    Mammogram: Not documented   Mammogram action/deferral: Not indicated  (01/05/2009)    DXA bone density scan: Not documented   DXA bone density action/deferral: Not indicated  (01/05/2009)   Smoking status: quit  (06/05/2009)  Lipids   Total Cholesterol: 127  (10/20/2008)   LDL: 60  (10/20/2008)   LDL Direct: Not documented   HDL: 34  (10/20/2008)   Triglycerides: 165  (10/20/2008)    SGOT (AST): 10  (10/20/2008)   SGPT (ALT): <8 U/L  (10/20/2008)   Alkaline phosphatase: 81  (10/20/2008)   Total bilirubin: 0.3  (10/20/2008)    Lipid flowsheet reviewed?: Yes   Progress toward LDL goal: At goal  Self-Management Support :   Personal Goals (by the next clinic visit) :      Personal LDL goal: 100  (01/05/2009)    Patient  will work on the following items until the next clinic visit to reach self-care goals:     Medications and monitoring: take my medicines every day, bring all of my medications to every visit, weigh myself weekly  (06/05/2009)     Eating: drink diet soda or water instead of juice or soda, eat more vegetables, use fresh or frozen vegetables, eat foods that are low in salt, eat baked foods instead of fried  foods, eat fruit for snacks and desserts, limit or avoid alcohol  (06/05/2009)     Activity: take a 30 minute walk every day  (06/05/2009)    Lipid self-management support: Written self-care plan  (06/05/2009)   Lipid self-care plan printed.    Lipid self-management support not done because: Good outcomes  (05/01/2009)

## 2010-05-31 NOTE — Progress Notes (Signed)
  Phone Note Call from Patient   Caller: Daughter Reason for Call: Talk to Doctor Summary of Call: Daughter reports ongoing moderate to severe abdominal pain for one month. she also reports nausea. She reports that pt is eating enough to survive but constantly having some nausea. She also has a skin lesion that bleeds in her groin area. I told her to bring pt to ED if pain was worsening. She rather would have her evaluated in clinic tomorrow. Can we please make her an appointment.  Initial call taken by: Clerance Lav MD,  Sep 05, 2009 6:07 PM

## 2010-05-31 NOTE — Progress Notes (Signed)
Summary: Refill/gh  Phone Note Refill Request Message from:  Patient on February 15, 2010 10:18 AM  Refills Requested: Medication #1:  PROTONIX 40 MG TBEC Take 1 tablet by mouth daily.   Dosage confirmed as above?Dosage Confirmed   Brand Name Necessary? No   Supply Requested: 1 year  Medication #2:  SERTRALINE HCL 25 MG TABS Take one pill by mouth once daily.   Dosage confirmed as above?Dosage Confirmed   Brand Name Necessary? No   Supply Requested: 1 year  Medication #3:  LORAZEPAM 0.5 MG TABS one pill as needed for anxiety   Dosage confirmed as above?Dosage Confirmed   Brand Name Necessary? No   Supply Requested: 1 month Pt is completely out.   Method Requested: Electronic Initial call taken by: Angelina Ok RN,  February 15, 2010 10:19 AM  Follow-up for Phone Call        Refill approved-nurse to complete Follow-up by: Lars Mage MD,  February 16, 2010 9:19 AM  Additional Follow-up for Phone Call Additional follow up Details #1::        Rx called to pharmacy Additional Follow-up by: Angelina Ok RN,  February 20, 2010 4:14 PM    Prescriptions: LORAZEPAM 0.5 MG TABS (LORAZEPAM) one pill as needed for anxiety  #30 x 0   Entered and Authorized by:   Lars Mage MD   Signed by:   Lars Mage MD on 02/16/2010   Method used:   Telephoned to ...       [facility] [address] #[id number]* (retail)       [address] [place], [place]  [address]       Ph: [phone]       Fax: [phone]   RxID:   [id number] SERTRALINE HCL 25 MG TABS (SERTRALINE HCL) Take one pill by mouth once daily  #31 x 11   Entered and Authorized by:   Lars Mage MD   Signed by:   Lars Mage MD on 02/16/2010   Method used:   Electronically to        [facility] #[id number]* (retail)       [address] [place], [place]  [address]       Ph: [phone]       Fax: [phone]   RxID:   [id number] PROTONIX 40 MG TBEC (PANTOPRAZOLE SODIUM) Take 1 tablet by mouth daily.  #30 x 11   Entered and Authorized by:   Lars Mage MD   Signed by:   Lars Mage MD on 02/16/2010   Method used:   Electronically to        [facility] #[id number]* (retail)       [address] [place], [place]  [address]       Ph: [phone]       Fax: [phone]   RxID:   [id number]

## 2010-05-31 NOTE — Letter (Signed)
Summary: HEMATOLOGY/MEDIAL ONCOLOGY  HEMATOLOGY/MEDIAL ONCOLOGY   Imported By: Margie Billet 07/20/2009 11:10:44  _____________________________________________________________________  External Attachment:    Type:   Image     Comment:   External Document

## 2010-05-31 NOTE — Assessment & Plan Note (Signed)
Summary: ACUTE-STOMACH PAIN-(GARG)/CFB   Vital Signs:  Patient profile:   77 year old female Height:      67 inches Weight:      136.2 pounds BMI:     21.41 Temp:     97.3 degrees F oral Pulse rate:   71 / minute BP sitting:   93 / 60  (right arm)  Vitals Entered By: Filomena Jungling NT II (August 22, 2009 2:34 PM) CC: ABdominal pain, HAS LOTS OF GAS Is Patient Diabetic? No Nutritional Status BMI of 19 -24 = normal  Have you ever been in a relationship where you felt threatened, hurt or afraid?No   Does patient need assistance? Functional Status Self care Ambulation Normal   Primary Care Provider:  Lars Mage MD  CC:  ABdominal pain and HAS LOTS OF GAS.  History of Present Illness: 77 year old women with PMH as described in EMR presents today with c/o abdominal bloating.  1. Abdominal bloating: pt reports a 1 wk hx of increased abdominal distension, gas, and burping.  She is tolerating oral fluids and food well and is still having normal bowel movements.  She had one episode of mild abdominal pain the day prior to her appt; this is resolved.  She denies fever, chills, nasuea, vomiting, diarrhea, BRBRP, hematemsis, and dark tarry stool.  She has been taking her protonix only as needed for pain.  2. C/O: irritated skin tag on her buttock.   Pt reports presence of skin tag for many months that becomes irritated and painful after sitting for long periods. Denies ulceration, erythema, or draining wound.  3. C/O: irritated skin on her left ear that is similar to her scalp dermatities. Uses tar shampoo for her scalp; has not applied this shampoo to her ear. No rash present elsewhere on her body.  4. C/O: skin rash in her groin that is pruitic and irritated, similar to yeast infections she has experienced in the past.  Denies vaginal itching,burning, discharge, pain, dysuria, hematuria, urinary urgency and hesitancy.  5.C/O fatigue, loss of interest in her usual activities, feeling blue  occasionally, and increased sleep.  Pt denies suicidal/homicidal ideation.  No other concerns or complaints at this time.  Preventive Screening-Counseling & Management  Alcohol-Tobacco     Alcohol drinks/day: 0     Smoking Status: quit     Year Quit: 29yrs ago  Caffeine-Diet-Exercise     Diet Counseling: not indicated; diet is assessed to be healthy     Does Patient Exercise: no  Current Problems (verified): 1)  Fungal Dermatitis  (ICD-111.9) 2)  Skin Tag  (ICD-701.9) 3)  Abdominal Distension  (ICD-787.3) 4)  Peripheral Edema  (ICD-782.3) 5)  Fatigue  (ICD-780.79) 6)  Ankle Pain, Left  (ICD-719.47) 7)  Gait Imbalance  (ICD-781.2) 8)  Monoclonal Gammopathy  (ICD-273.1) 9)  Weight Loss  (ICD-783.21) 10)  Vitamin B12 Deficiency  (ICD-266.2) 11)  Vitamin D Deficiency  (ICD-268.9) 12)  Postural Hypotension  (ICD-458.0) 13)  Gerd  (ICD-530.81) 14)  Allergic Rhinitis, Seasonal  (ICD-477.0) 15)  Dementia  (ICD-294.8) 16)  Preventive Health Care  (ICD-V70.0) 17)  Dermatophytosis of Scalp and Beard  (ICD-110.0) 18)  Psoriasis  (ICD-696.1) 19)  Bowel Obstruction  (ICD-560.9) 20)  Actinic Keratosis  (ICD-702.0) 21)  Dermatitis  (ICD-692.9) 22)  Renal Cyst  (ICD-593.2) 23)  Spinal Stenosis, Lumbar  (ICD-724.02) 24)  Postprocedural Status Nec  (ICD-V45.89) 25)  Laminectomy, Lumbar, Hx of  (ICD-V45.89) 26)  Carpal Tunnel Release, Hx of  (  ICD-V45.89) 27)  Appendectomy, Hx of  (ICD-V45.79) 28)  Arthroscopy, Left Knee, Hx of  (ICD-V45.89) 29)  Cataract Extraction, Left Eye, Hx of  (ICD-V45.61) 30)  Ovarian Cystectomy, Hx of  (ICD-V45.89) 31)  Colectomy, Hx of  (ICD-V15.2) 32)  Osteopenia  (ICD-733.90) 33)  Hyperlipidemia  (ICD-272.4) 34)  Diverticulosis, Colon  (ICD-562.10) 35)  Depression  (ICD-311) 36)  Colon Cancer, Hx of  (ICD-V10.05) 37)  Anxiety  (ICD-300.00)  Current Medications (verified): 1)  Protonix 40 Mg Tbec (Pantoprazole Sodium) .... Take 1 Tablet By Mouth  Daily. 2)  Adult Aspirin Ec Low Strength 81 Mg Tbec (Aspirin) .... Take 1 Tablet By Mouth Once A Day 3)  Cyanocobalamin 1000 Mcg/ml Soln (Cyanocobalamin) .... Inject Im Once Monthly. 4)  Metoclopramide Hcl 5 Mg Tabs (Metoclopramide Hcl) .... Take 1 Tab By Mouth As Needed For Nausea. 5)  Lorazepam 0.5 Mg Tabs (Lorazepam) .... One Pill As Needed For Anxiety 6)  Nystatin 100000 Unit/gm Powd (Nystatin) .... Apply To Affected Area 2-3 Times Each Day 7)  Hydrocortisone 1 % Oint (Hydrocortisone) .... Apply To Ear 2-4 Times Per Day Until Rash Resolves 8)  Sertraline Hcl 25 Mg Tabs (Sertraline Hcl) .... Take One Pill By Mouth Once Daily 9)  Magnesium Oxide 400 Mg Tabs (Magnesium Oxide) .... Take 2 Pills By Mouth Two Times A Day 10)  K-Lor 20 Meq Pack (Potassium Chloride) .... Take One Pill By Mouth Once Daily For 7 Days  Allergies (verified): No Known Drug Allergies  Past History:  Past medical, surgical, family and social histories (including risk factors) reviewed, and no changes noted (except as noted below).  Past Medical History: Reviewed history from 07/01/2008 and no changes required. [facility]   Anxiety Colon cancer, hx of      h/o bowel obstruction Rx with colace and miralax (Dr. Loreta Ave & Dr. Purnell Shoemaker) Depression Diverticulosis, colon Osteopenia Cellulitis left elbow Lumbar spinal stenosis Atrophic vaginitis Vaginitis Renal cyst Tobacco abuse Dermatitis Hip pain Actinic keratosis Dementia  Past Surgical History: Reviewed history from 03/24/2006 and no changes required. Hemicolectomy- Right side Resection of ovearian cyst Cataract removal- left eye Arthroscopy of left knee Appendectomy Carpal tunnel release Lumbar laminectomy Lumbar fusion  Family History: Reviewed history from 09/24/2006 and no changes required. Patient has 2 daughters, one of whom she lives with  Social History: Reviewed history from  02/16/2008 and no changes required. Lives with one of her daughters, no smoking, no etoh, no illegal drugs.  Husband died of myeloma.  Review of Systems GI:  Complains of gas, hemorrhoids, and indigestion; denies change in bowel habits, constipation, excessive appetite, and nausea.  Physical Exam  General:  alert, well-developed, and well-nourished.   Head:  normocephalic and atraumatic.   Eyes:  pupils equal, pupils round, and pupils reactive to light.   Mouth:  pharynx pink and moist.   Neck:  supple and no masses.   Lungs:  normal respiratory effort, no intercostal retractions, no accessory muscle use, and normal breath sounds.   Heart:  normal rate and regular rhythm.   Abdomen:  Mildly distended and tympanic.  Soft and non-tender.  normal bowel sounds, no masses, no guarding, no rigidity, and no rebound tenderness.   Genitalia:  Confluent erythematous rash present in the inguinal folds.  No ulcers or other abnormal lesions present. Extremities:  1+ edema bilaterally. Neurologic:  alert & oriented X3.  No focal defecit. Skin:  turgor normal and color normal.   +  deeply erythematous scaling rash on left ear lobe,extending along pos-auricular fold.  No ulcers or other lesions present.  Approx 0.5cm x 0.5 cm circular, raised skin colored lesion on the inferior inner left buttock.  There is no clear stalk, or cauilflower like features to this lesion.  No ulceration, blistering, erythema, induration, or evidence of drainage. Psych:  normally interactive, good eye contact, not anxious appearing, and not depressed appearing.     Impression & Recommendations:  Problem # 1:  ABDOMINAL DISTENSION (ICD-787.3) Assessment New Pt has a hx of multiple SBO and ileus.  Based on her presenting hx and exam findings, I do not believe her current symptoms of increased flatus, burping, and mild distention reflect recurrance of an SBO/ileus.  Her symptoms are more c/w with GERD.  Discussed the need for  daily therapy with PPI for the medication to be effective and strongly encourage pt to take Protonix daily,as directed.  Also encouraged her to continue with regular probiotics in the form of pills or yogurt, as this has significantly helped her symptoms in the past.  As she experiences chronic loose stool as the result of her colectomy, BMET and Mag were checked.  Her K and Mag were low;this is likely to be a chronic issue for the pt.  On 08/23/09, I contacted the pts daughter, [name] to discuss the lab results.  Will replete her for the next week with KCl once daily and mag oxide 400mg  2 tabs two times a day. She will come back for repeat bmet and mag next week.  These labs will be followed up to determine if she will need to continue with regular mag/k supplementation.  Discussed this with daughter; she will bring pt back in one week for labs.  Orders: T-Basic Metabolic Panel [id number]) T-Magnesium [id number])  Problem # 2:  DERMATITIS, SEBORRHEIC (ICD-690.10) Rash on pts ear is c/w seborreic dermatitis.  Advised pt to apply tar shampoo to affected area when she is washing her hair.  Will also prescribe hydrocortisone ointment for additional treatment.  Problem # 3:  FUNGAL DERMATITIS (ICD-111.9) Exam findings are c/w candidal dermatitis.   Will tx with nystatin powder.  Her updated medication list for this problem includes:    Nystatin 100000 Unit/gm Powd (Nystatin) .Marland Kitchen... Apply to affected area 2-3 times each day  Problem # 4:  SKIN TAG (ICD-701.9) Pt with what appears to be an atypical skin tag or other dermatological lesion present of her left buttock.  I am not sure what the lesion is; this, in addition to lack of stalk and sensitive area leads me to believe derm evaluation and bx/removal is ideal.  Will refer pt to her dermatologist today.  Problem # 5:  FATIGUE (ICD-780.79) Pts fatigue seems most c/w depression.  Will check BMET and thyroid function for additional workup.   Will start Zoloft as outlined in Dr. Sumner Boast plan at last visit. Orders: T-Basic Metabolic Panel [id number]) T-TSH [id number]) T-T4, Free [phone])  Problem # 6:  DEPRESSION (ICD-311) Discussed treatment options, including trial of antidpressant medication. Will start pt on zoloft as discussed at last office visit.  Pt understands the medication will take 3-4 weeks to work.  Patient agrees to call if any worsening of symptoms or thoughts of doing harm arise. Verified that the patient has no suicidal ideation at this time.   Her updated medication list for this problem includes:    Lorazepam 0.5 Mg Tabs (Lorazepam) ..... One pill as needed for anxiety  Sertraline Hcl 25 Mg Tabs (Sertraline hcl) .Marland Kitchen... Take one pill by mouth once daily  Problem # 7:  HYPOMAGNESEMIA (ICD-275.2) Mag is low at 1.2 likely 2/2 GI loss from chronic loose stool.  Called pts daughter to inform her of results and need for oral repletion.  Will submit rx and f/u with repeat mag level next week.  Future labs aleady entered.  Future Orders: T-Basic Metabolic Panel [id number]) ... 08/25/2009 T-Magnesium [id number]) ... 08/25/2009  Problem # 8:  HYPOKALEMIA (ICD-276.8) Likley 2/2 hypomagnesemia.  Will replete for the next week and bring pt in for repeat bmet.  Future Orders: T-Basic Metabolic Panel [id number]) ... 08/25/2009 T-Magnesium [id number]) ... 08/25/2009  Complete Medication List: 1)  Protonix 40 Mg Tbec (Pantoprazole sodium) .... Take 1 tablet by mouth daily. 2)  Adult Aspirin Ec Low Strength 81 Mg Tbec (Aspirin) .... Take 1 tablet by mouth once a day 3)  Cyanocobalamin 1000 Mcg/ml Soln (Cyanocobalamin) .... Inject im once monthly. 4)  Lorazepam 0.5 Mg Tabs (Lorazepam) .... One pill as needed for anxiety 5)  Nystatin 100000 Unit/gm Powd (Nystatin) .... Apply to affected area 2-3 times each day 6)  Hydrocortisone 1 % Oint (Hydrocortisone) .... Apply to ear 2-4 times per day until  rash resolves 7)  Sertraline Hcl 25 Mg Tabs (Sertraline hcl) .... Take one pill by mouth once daily 8)  Magnesium Oxide 400 Mg Tabs (Magnesium oxide) .... Take 2 pills by mouth two times a day 9)  K-lor 20 Meq Pack (Potassium chloride) .... Take one pill by mouth once daily for 7 days  Other Orders: Dermatology Referral (Derma)  Patient Instructions: 1)  Please schedule a follow-up appointment in 1 month. 2)  We will call you if your lab work is abnormal. 3)  Take your PROTONIX EVERY DAY!  This will help your abdominal symptoms. 4)  Take a probiotic supplement regularly. 5)  You have been given a new prescription for Zoloft.  This will help with your mood and energy level.  It is important to take this medicine every day for at least 3 weeks before you will notice a benefit.   6)  You have been given nystatin powder to use in your groin area. 7)  You have been given a steroid cream (hyrdrocortisone)  to use on your ear. 8)  We will set up an appt for you to see your skin doctor for removal of your skin tag. Prescriptions: CYANOCOBALAMIN 1000 MCG/ML SOLN (CYANOCOBALAMIN) Inject IM once monthly.  #10 mL x 1   Entered and Authorized by:   Nelda Bucks DO   Signed by:   Nelda Bucks DO on 08/23/2009   Method used:   Historical   RxID:   [id number] K-LOR 20 MEQ PACK (POTASSIUM CHLORIDE) Take one pill by mouth once daily for 7 days  #7 x 0   Entered and Authorized by:   Nelda Bucks DO   Signed by:   Nelda Bucks DO on 08/23/2009   Method used:   Electronically to        [id number]* (retail)       [address] [place], [place]  [address]       Ph: [phone]       Fax: [phone]   RxID:   [id number] MAGNESIUM OXIDE 400 MG TABS (MAGNESIUM OXIDE) Take 2 pills by mouth two times a day  #60 x 1   Entered and Authorized  by:   Nelda Bucks DO   Signed by:   Nelda Bucks DO on 08/23/2009   Method used:   Electronically to        [id number]* (retail)       [address] [place], [place]  [address]       Ph: [phone]       Fax: [phone]   RxID:   [id number] SERTRALINE HCL 25 MG TABS (SERTRALINE HCL) Take one pill by mouth once daily  #31 x 1   Entered and Authorized by:   Nelda Bucks DO   Signed by:   Nelda Bucks DO on 08/23/2009   Method used:   Electronically to        [facility] #[id number]* (retail)       [address] [place], [place]  [address]       Ph: [phone]       Fax: [phone]   RxID:   [id number] HYDROCORTISONE 1 % OINT (HYDROCORTISONE) apply to ear 2-4 times per day until rash resolves  #30gm tube x 2   Entered and Authorized by:   Nelda Bucks DO   Signed by:   Nelda Bucks DO on 08/23/2009   Method used:   Electronically to        [facility] #[id number]* (retail)       [address] [place], [place]  [address]       Ph: [phone]       Fax: [phone]   RxID:   [id number] NYSTATIN 100000 UNIT/GM POWD (NYSTATIN) Apply to affected area 2-3 times each day  #22mo supply x 2   Entered and Authorized by:   Nelda Bucks DO   Signed by:   Nelda Bucks DO on 08/23/2009   Method used:   Electronically to        [id number]* (retail)       [address] [place], [place]  [address]       Ph: [phone]       Fax: [phone]   RxID:   [id number]  Process Orders Check Orders Results:     [facility]: Check successful Tests Sent for requisitioning (August 24, 2009 1:46 PM):     08/22/2009: [facility] -- T-Basic Metabolic Panel [id number] (signed)     08/22/2009: [facility] -- T-TSH [id number] [id number] (signed)     08/22/2009: [facility] -- [place], [place] [[id number]] (signed)     08/22/2009: [facility] -- T-Magnesium [[id number]] (signed)     08/25/2009: [facility] -- T-Basic Metabolic Panel  [id number] (signed)     08/25/2009: [facility] -- T-Magnesium [[id number]] (signed)    Prevention & Chronic Care Immunizations   Influenza vaccine: Fluvax 3+  (04/12/2009)   Influenza vaccine deferral: Not available  (01/05/2009)    Tetanus booster: 05/01/2009: Td    Pneumococcal vaccine: Not documented   Pneumococcal vaccine deferral: Not indicated  (06/05/2009)    H. zoster vaccine: Not documented   H. zoster vaccine deferral: Deferred  (06/05/2009)  Colorectal Screening   Hemoccult: Not documented   Hemoccult action/deferral: Not indicated  (01/05/2009)    Colonoscopy: Not documented   Colonoscopy action/deferral: Not indicated  (01/05/2009)  Other Screening   Pap smear: Not documented  Pap smear action/deferral: Not indicated-other  (01/05/2009)    Mammogram: Not documented   Mammogram action/deferral: Not indicated  (01/05/2009)    DXA bone density scan: Not documented   DXA bone density action/deferral: Not indicated  (01/05/2009)   Smoking status: quit  (08/22/2009)  Lipids   Total Cholesterol: 127  (10/20/2008)   LDL: 60  (10/20/2008)   LDL Direct: Not documented   HDL: 34  (10/20/2008)   Triglycerides: 165  (10/20/2008)    SGOT (AST): 10  (10/20/2008)   SGPT (ALT): <8 U/L  (10/20/2008)   Alkaline phosphatase: 81  (10/20/2008)   Total bilirubin: 0.3  (10/20/2008)  Self-Management Support :   Personal Goals (by the next clinic visit) :      Personal LDL goal: 100  (01/05/2009)    Patient will work on the following items until the next clinic visit to reach self-care goals:     Medications and monitoring: take my medicines every day, weigh myself weekly  (08/22/2009)     Eating: drink diet soda or water instead of juice or soda, eat more vegetables, use fresh or frozen vegetables, eat foods that are low in salt, eat baked foods instead of fried foods, eat fruit for snacks and desserts, limit or avoid alcohol  (08/22/2009)      Activity: take a 30 minute walk every day  (08/22/2009)    Lipid self-management support: Written self-care plan  (06/05/2009)     Lipid self-management support not done because: Good outcomes  (05/01/2009)

## 2010-05-31 NOTE — Procedures (Signed)
Summary: [facility].: Operative Report  [facility].: Operative Report   Imported By: Florinda Marker 10/26/2008 14:35:53  _____________________________________________________________________  External Attachment:    Type:   Image     Comment:   External Document

## 2010-05-31 NOTE — Letter (Signed)
Summary: [facility] DENIAL  [facility] DEN[facility]   Imported By: Shon Hough 01/16/2010 08:52:18  _____________________________________________________________________  External Attachment:    Type:   Image     Comment:   External Document

## 2010-06-25 ENCOUNTER — Other Ambulatory Visit: Payer: Self-pay | Admitting: Internal Medicine

## 2010-06-28 ENCOUNTER — Encounter: Payer: Self-pay | Admitting: Internal Medicine

## 2010-07-16 ENCOUNTER — Ambulatory Visit: Payer: Self-pay | Admitting: Internal Medicine

## 2010-07-16 LAB — BASIC METABOLIC PANEL
BUN: 19 mg/dL (ref 6–23)
BUN: 5 mg/dL — ABNORMAL LOW (ref 6–23)
BUN: 8 mg/dL (ref 6–23)
BUN: 8 mg/dL (ref 6–23)
CO2: 22 mEq/L (ref 19–32)
CO2: 26 mEq/L (ref 19–32)
CO2: 27 mEq/L (ref 19–32)
Calcium: 7.9 mg/dL — ABNORMAL LOW (ref 8.4–10.5)
Calcium: 8.1 mg/dL — ABNORMAL LOW (ref 8.4–10.5)
Calcium: 8.4 mg/dL (ref 8.4–10.5)
Chloride: 104 mEq/L (ref 96–112)
Chloride: 110 mEq/L (ref 96–112)
Chloride: 111 mEq/L (ref 96–112)
Chloride: 114 mEq/L — ABNORMAL HIGH (ref 96–112)
Chloride: 114 mEq/L — ABNORMAL HIGH (ref 96–112)
Creatinine, Ser: 0.78 mg/dL (ref 0.4–1.2)
Creatinine, Ser: 0.81 mg/dL (ref 0.4–1.2)
Creatinine, Ser: 0.82 mg/dL (ref 0.4–1.2)
Creatinine, Ser: 0.82 mg/dL (ref 0.4–1.2)
Creatinine, Ser: 0.83 mg/dL (ref 0.4–1.2)
GFR calc Af Amer: >60 mL/min (ref >60)
GFR calc Af Amer: >60 mL/min (ref >60)
GFR calc Af Amer: >60 mL/min (ref >60)
GFR calc non Af Amer: >60 mL/min (ref >60)
GFR calc non Af Amer: >60 mL/min (ref >60)
GFR calc non Af Amer: >60 mL/min (ref >60)
Glucose, Bld: 90 mg/dL (ref 70–99)
Glucose, Bld: 97 mg/dL (ref 70–99)
Potassium: 3.3 mEq/L — ABNORMAL LOW (ref 3.5–5.1)
Potassium: 3.9 mEq/L (ref 3.5–5.1)
Potassium: 5.2 mEq/L — ABNORMAL HIGH (ref 3.5–5.1)
Sodium: 138 mEq/L (ref 135–145)
Sodium: 139 mEq/L (ref 135–145)

## 2010-07-16 LAB — PHOSPHORUS: Phosphorus: 2.8 mg/dL (ref 2.3–4.6)

## 2010-07-16 LAB — CBC
HCT: 29.4 % — ABNORMAL LOW (ref 36.0–46.0)
HCT: 33.9 % — ABNORMAL LOW (ref 36.0–46.0)
HCT: 37.1 % (ref 36.0–46.0)
HCT: 38.6 % (ref 36.0–46.0)
Hemoglobin: 11.4 g/dL — ABNORMAL LOW (ref 12.0–15.0)
Hemoglobin: 11.5 g/dL — ABNORMAL LOW (ref 12.0–15.0)
Hemoglobin: 12.2 g/dL (ref 12.0–15.0)
Hemoglobin: 12.8 g/dL (ref 12.0–15.0)
MCH: 29 pg (ref 26.0–34.0)
MCHC: 32.9 g/dL (ref 30.0–36.0)
MCHC: 33 g/dL (ref 30.0–36.0)
MCHC: 33.2 g/dL (ref 30.0–36.0)
MCHC: 34 g/dL (ref 30.0–36.0)
MCV: 87 fL (ref 78.0–100.0)
MCV: 87.2 fL (ref 78.0–100.0)
Platelets: 159 10*3/uL (ref 150–400)
Platelets: 162 10*3/uL (ref 150–400)
Platelets: 178 10*3/uL (ref 150–400)
Platelets: 200 10*3/uL (ref 150–400)
RBC: 3.65 MIL/uL — ABNORMAL LOW (ref 3.87–5.11)
RBC: 3.72 MIL/uL — ABNORMAL LOW (ref 3.87–5.11)
RBC: 3.81 MIL/uL — ABNORMAL LOW (ref 3.87–5.11)
RBC: 3.9 MIL/uL (ref 3.87–5.11)
RDW: 14.3 % (ref 11.5–15.5)
RDW: 14.4 % (ref 11.5–15.5)
RDW: 14.5 % (ref 11.5–15.5)
RDW: 14.6 % (ref 11.5–15.5)
WBC: 4.4 10*3/uL (ref 4.0–10.5)
WBC: 4.7 10*3/uL (ref 4.0–10.5)
WBC: 5.2 10*3/uL (ref 4.0–10.5)
WBC: 5.9 10*3/uL (ref 4.0–10.5)
WBC: 6.6 10*3/uL (ref 4.0–10.5)

## 2010-07-16 LAB — GLUCOSE, CAPILLARY: Glucose-Capillary: 185 mg/dL — ABNORMAL HIGH (ref 70–99)

## 2010-07-16 LAB — COMPREHENSIVE METABOLIC PANEL
Albumin: 3 g/dL — ABNORMAL LOW (ref 3.5–5.2)
BUN: 20 mg/dL (ref 6–23)
Chloride: 109 mEq/L (ref 96–112)
Creatinine, Ser: 0.96 mg/dL (ref 0.4–1.2)
GFR calc non Af Amer: 56 mL/min — ABNORMAL LOW (ref >60)
Total Bilirubin: 0.5 mg/dL (ref 0.3–1.2)

## 2010-07-16 LAB — DIFFERENTIAL
Basophils Absolute: 0 10*3/uL (ref 0.0–0.1)
Basophils Relative: 0 % (ref 0–1)
Eosinophils Absolute: 0 10*3/uL (ref 0.0–0.7)
Eosinophils Relative: 0 % (ref 0–5)
Lymphocytes Relative: 32 % (ref 12–46)
Lymphs Abs: 1.5 10*3/uL (ref 0.7–4.0)
Monocytes Absolute: 0.3 10*3/uL (ref 0.1–1.0)
Monocytes Absolute: 0.4 10*3/uL (ref 0.1–1.0)
Monocytes Relative: 4 % (ref 3–12)
Monocytes Relative: 6 % (ref 3–12)
Neutro Abs: 2.8 10*3/uL (ref 1.7–7.7)
Neutro Abs: 9.3 10*3/uL — ABNORMAL HIGH (ref 1.7–7.7)

## 2010-07-16 LAB — URINE MICROSCOPIC-ADD ON

## 2010-07-16 LAB — URINALYSIS, ROUTINE W REFLEX MICROSCOPIC
Glucose, UA: NEGATIVE mg/dL
Glucose, UA: NEGATIVE mg/dL
Hgb urine dipstick: NEGATIVE
Ketones, ur: 15 mg/dL — AB
Ketones, ur: NEGATIVE mg/dL
Protein, ur: NEGATIVE mg/dL
Protein, ur: NEGATIVE mg/dL
Urobilinogen, UA: 0.2 mg/dL (ref 0.0–1.0)
pH: 5 (ref 5.0–8.0)

## 2010-07-16 LAB — CULTURE, BLOOD (ROUTINE X 2): Culture: NO GROWTH

## 2010-07-16 LAB — TECHNOLOGIST SMEAR REVIEW

## 2010-07-16 LAB — MAGNESIUM: Magnesium: 1.3 mg/dL — ABNORMAL LOW (ref 1.5–2.5)

## 2010-07-16 LAB — CEA: CEA: 1.2 ng/mL (ref 0.0–5.0)

## 2010-07-16 LAB — OCCULT BLOOD GASTRIC / DUODENUM (SPECIMEN CUP): Occult Blood, Gastric: POSITIVE — AB

## 2010-07-16 LAB — VITAMIN B12: Vitamin B-12: 201 pg/mL — ABNORMAL LOW (ref 211–911)

## 2010-07-16 LAB — IRON AND TIBC
Iron: 47 ug/dL (ref 42–135)
Saturation Ratios: 20 % (ref 20–55)
TIBC: 230 ug/dL — ABNORMAL LOW (ref 250–470)
UIBC: 183 ug/dL

## 2010-07-16 LAB — RETICULOCYTES: Retic Count, Absolute: 48.5 10*3/uL (ref 19.0–186.0)

## 2010-07-17 LAB — CBC
HCT: 31.8 % — ABNORMAL LOW (ref 36.0–46.0)
HCT: 33.5 % — ABNORMAL LOW (ref 36.0–46.0)
HCT: 34 % — ABNORMAL LOW (ref 36.0–46.0)
HCT: 37.1 % (ref 36.0–46.0)
Hemoglobin: 11.2 g/dL — ABNORMAL LOW (ref 12.0–15.0)
Hemoglobin: 11.4 g/dL — ABNORMAL LOW (ref 12.0–15.0)
Hemoglobin: 12.1 g/dL (ref 12.0–15.0)
MCHC: 32.7 g/dL (ref 30.0–36.0)
MCHC: 33.6 g/dL (ref 30.0–36.0)
MCV: 87.5 fL (ref 78.0–100.0)
MCV: 87.6 fL (ref 78.0–100.0)
MCV: 87.7 fL (ref 78.0–100.0)
MCV: 88.1 fL (ref 78.0–100.0)
Platelets: 164 10*3/uL (ref 150–400)
Platelets: 164 10*3/uL (ref 150–400)
RBC: 3.81 MIL/uL — ABNORMAL LOW (ref 3.87–5.11)
RBC: 3.84 MIL/uL — ABNORMAL LOW (ref 3.87–5.11)
RBC: 3.89 MIL/uL (ref 3.87–5.11)
RBC: 4.24 MIL/uL (ref 3.87–5.11)
RDW: 14.6 % (ref 11.5–15.5)
WBC: 5.1 10*3/uL (ref 4.0–10.5)
WBC: 5.4 10*3/uL (ref 4.0–10.5)
WBC: 5.5 10*3/uL (ref 4.0–10.5)
WBC: 6 10*3/uL (ref 4.0–10.5)

## 2010-07-17 LAB — BASIC METABOLIC PANEL
BUN: 5 mg/dL — ABNORMAL LOW (ref 6–23)
CO2: 26 mEq/L (ref 19–32)
CO2: 28 mEq/L (ref 19–32)
CO2: 28 mEq/L (ref 19–32)
Calcium: 8.4 mg/dL (ref 8.4–10.5)
Chloride: 107 mEq/L (ref 96–112)
Chloride: 107 mEq/L (ref 96–112)
Chloride: 108 mEq/L (ref 96–112)
Creatinine, Ser: 0.71 mg/dL (ref 0.4–1.2)
Creatinine, Ser: 0.85 mg/dL (ref 0.4–1.2)
Creatinine, Ser: 0.86 mg/dL (ref 0.4–1.2)
GFR calc Af Amer: >60 mL/min (ref >60)
GFR calc Af Amer: >60 mL/min (ref >60)
Potassium: 3.5 mEq/L (ref 3.5–5.1)
Potassium: 4.5 mEq/L (ref 3.5–5.1)
Sodium: 140 mEq/L (ref 135–145)
Sodium: 140 mEq/L (ref 135–145)
Sodium: 141 mEq/L (ref 135–145)

## 2010-07-17 LAB — LIPASE, BLOOD: Lipase: 38 U/L (ref 11–59)

## 2010-07-17 LAB — DIFFERENTIAL
Basophils Relative: 0 % (ref 0–1)
Eosinophils Absolute: 0 10*3/uL (ref 0.0–0.7)
Lymphs Abs: 1.4 10*3/uL (ref 0.7–4.0)
Monocytes Relative: 6 % (ref 3–12)
Neutro Abs: 4.9 10*3/uL (ref 1.7–7.7)
Neutrophils Relative %: 72 % (ref 43–77)

## 2010-07-17 LAB — HEMOGLOBIN A1C
Hgb A1c MFr Bld: 5.6 % (ref <5.7)
Mean Plasma Glucose: 114 mg/dL (ref <117)

## 2010-07-17 LAB — COMPREHENSIVE METABOLIC PANEL
ALT: 11 U/L (ref 0–35)
Alkaline Phosphatase: 87 U/L (ref 39–117)
BUN: 13 mg/dL (ref 6–23)
CO2: 29 mEq/L (ref 19–32)
Calcium: 8.9 mg/dL (ref 8.4–10.5)
GFR calc non Af Amer: 59 mL/min — ABNORMAL LOW (ref >60)
Glucose, Bld: 109 mg/dL — ABNORMAL HIGH (ref 70–99)
Total Protein: 7.2 g/dL (ref 6.0–8.3)

## 2010-07-17 LAB — URINALYSIS, ROUTINE W REFLEX MICROSCOPIC
Glucose, UA: NEGATIVE mg/dL
Ketones, ur: NEGATIVE mg/dL
Nitrite: NEGATIVE
Protein, ur: NEGATIVE mg/dL
Urobilinogen, UA: 1 mg/dL (ref 0.0–1.0)

## 2010-07-17 LAB — MAGNESIUM
Magnesium: 1.8 mg/dL (ref 1.5–2.5)
Magnesium: 1.9 mg/dL (ref 1.5–2.5)
Magnesium: 2 mg/dL (ref 1.5–2.5)

## 2010-07-17 LAB — PHOSPHORUS
Phosphorus: 2.6 mg/dL (ref 2.3–4.6)
Phosphorus: 3.2 mg/dL (ref 2.3–4.6)

## 2010-07-17 LAB — URINE MICROSCOPIC-ADD ON

## 2010-07-23 ENCOUNTER — Ambulatory Visit: Payer: Self-pay | Admitting: Internal Medicine

## 2010-07-30 LAB — DIFFERENTIAL
Basophils Absolute: 0.1 10*3/uL (ref 0.0–0.1)
Basophils Relative: 1 % (ref 0–1)
Eosinophils Absolute: 0.1 10*3/uL (ref 0.0–0.7)
Monocytes Relative: 5 % (ref 3–12)
Neutro Abs: 8.6 10*3/uL — ABNORMAL HIGH (ref 1.7–7.7)
Neutrophils Relative %: 82 % — ABNORMAL HIGH (ref 43–77)

## 2010-07-30 LAB — CBC
HCT: 30.4 % — ABNORMAL LOW (ref 36.0–46.0)
HCT: 33.4 % — ABNORMAL LOW (ref 36.0–46.0)
HCT: 37.2 % (ref 36.0–46.0)
Hemoglobin: 10 g/dL — ABNORMAL LOW (ref 12.0–15.0)
Hemoglobin: 12.5 g/dL (ref 12.0–15.0)
MCHC: 33 g/dL (ref 30.0–36.0)
MCV: 87.8 fL (ref 78.0–100.0)
Platelets: 155 10*3/uL (ref 150–400)
RBC: 4.29 MIL/uL (ref 3.87–5.11)
RDW: 14.9 % (ref 11.5–15.5)
RDW: 15 % (ref 11.5–15.5)
RDW: 15 % (ref 11.5–15.5)
WBC: 3.7 10*3/uL — ABNORMAL LOW (ref 4.0–10.5)

## 2010-07-30 LAB — BASIC METABOLIC PANEL
BUN: 14 mg/dL (ref 6–23)
BUN: 4 mg/dL — ABNORMAL LOW (ref 6–23)
BUN: 5 mg/dL — ABNORMAL LOW (ref 6–23)
CO2: 24 mEq/L (ref 19–32)
CO2: 26 mEq/L (ref 19–32)
CO2: 26 mEq/L (ref 19–32)
Calcium: 7.3 mg/dL — ABNORMAL LOW (ref 8.4–10.5)
Calcium: 7.7 mg/dL — ABNORMAL LOW (ref 8.4–10.5)
Calcium: 7.7 mg/dL — ABNORMAL LOW (ref 8.4–10.5)
Chloride: 112 mEq/L (ref 96–112)
Chloride: 115 mEq/L — ABNORMAL HIGH (ref 96–112)
Creatinine, Ser: 0.81 mg/dL (ref 0.4–1.2)
GFR calc Af Amer: >60 mL/min (ref >60)
GFR calc non Af Amer: >60 mL/min (ref >60)
GFR calc non Af Amer: >60 mL/min (ref >60)
Glucose, Bld: 103 mg/dL — ABNORMAL HIGH (ref 70–99)
Glucose, Bld: 104 mg/dL — ABNORMAL HIGH (ref 70–99)
Glucose, Bld: 105 mg/dL — ABNORMAL HIGH (ref 70–99)
Glucose, Bld: 93 mg/dL (ref 70–99)
Potassium: 3.1 mEq/L — ABNORMAL LOW (ref 3.5–5.1)
Potassium: 4 mEq/L (ref 3.5–5.1)
Potassium: 4.5 mEq/L (ref 3.5–5.1)
Sodium: 141 mEq/L (ref 135–145)
Sodium: 142 mEq/L (ref 135–145)
Sodium: 143 mEq/L (ref 135–145)

## 2010-07-30 LAB — CULTURE, BLOOD (ROUTINE X 2): Culture: NO GROWTH

## 2010-07-30 LAB — COMPREHENSIVE METABOLIC PANEL
ALT: 13 U/L (ref 0–35)
Alkaline Phosphatase: 91 U/L (ref 39–117)
BUN: 16 mg/dL (ref 6–23)
CO2: 25 mEq/L (ref 19–32)
Chloride: 102 mEq/L (ref 96–112)
GFR calc non Af Amer: >60 mL/min (ref >60)
Glucose, Bld: 172 mg/dL — ABNORMAL HIGH (ref 70–99)
Potassium: 3.3 mEq/L — ABNORMAL LOW (ref 3.5–5.1)
Sodium: 136 mEq/L (ref 135–145)
Total Bilirubin: 0.6 mg/dL (ref 0.3–1.2)
Total Protein: 7 g/dL (ref 6.0–8.3)

## 2010-07-30 LAB — URINALYSIS, ROUTINE W REFLEX MICROSCOPIC
Glucose, UA: NEGATIVE mg/dL
Ketones, ur: 15 mg/dL — AB
Leukocytes, UA: NEGATIVE
Protein, ur: 30 mg/dL — AB
Urobilinogen, UA: 1 mg/dL (ref 0.0–1.0)

## 2010-07-30 LAB — MAGNESIUM: Magnesium: 2.5 mg/dL (ref 1.5–2.5)

## 2010-07-30 LAB — URINE MICROSCOPIC-ADD ON

## 2010-08-02 ENCOUNTER — Other Ambulatory Visit: Payer: Self-pay | Admitting: Internal Medicine

## 2010-08-02 ENCOUNTER — Encounter: Payer: Self-pay | Admitting: Internal Medicine

## 2010-08-02 ENCOUNTER — Ambulatory Visit (INDEPENDENT_AMBULATORY_CARE_PROVIDER_SITE_OTHER): Payer: Self-pay | Admitting: Internal Medicine

## 2010-08-02 DIAGNOSIS — F411 Generalized anxiety disorder: Secondary | ICD-10-CM

## 2010-08-02 DIAGNOSIS — D472 Monoclonal gammopathy: Secondary | ICD-10-CM

## 2010-08-02 DIAGNOSIS — R109 Unspecified abdominal pain: Secondary | ICD-10-CM

## 2010-08-02 MED ORDER — MELOXICAM 7.5 MG PO TABS: 7.5000 mg | ORAL_TABLET | Freq: Every day | ORAL | Status: DC

## 2010-08-02 NOTE — Progress Notes (Signed)
  Subjective:    Patient ID: Rachel Bridges, female    DOB: April 09, 1925, 77 y.o.   MRN: [id number]  HPI Ms. Rachel Bridges is 77 year old female with past medical history of diverticulitis, colon cancer and monoclonal gammopathy of unknown significance.  She comes in today with chief complaints of abdominal pain going on for last 2-3 weeks she says that pain is mostly present in the lower part of her abdomen 5-6 at its worse, it's dull aching and it feels like that she's having episodes of diverticulitis. She denies having any fevers or chills. She says that she's not constipated and moving her bowels regularly.  Patient also complains of increased fatigue and last 2-3 weeks. When I law saw the patient patient was running down the hallway without any help and today she came in on a wheelchair which is a drastic decrease in functional ability.  Patient also complains that she hasn't been eating a lot and does not feel hungry.  Motor complaints.    Review of Systems  Constitutional: Negative for fever, activity change and appetite change.  HENT: Negative for sore throat.   Respiratory: Negative for cough and shortness of breath.   Cardiovascular: Negative for chest pain and leg swelling.  Gastrointestinal: Negative for nausea, abdominal pain, diarrhea, constipation and abdominal distention.  Genitourinary: Negative for frequency, hematuria and difficulty urinating.  Neurological: Negative for dizziness and headaches.  Psychiatric/Behavioral: Negative for suicidal ideas and behavioral problems.       Objective:   Physical Exam  Constitutional: She is oriented to person, place, and time. She appears well-developed and well-nourished.  HENT:  Head: Normocephalic and atraumatic.  Eyes: Conjunctivae and EOM are normal. Pupils are equal, round, and reactive to light. No scleral icterus.  Neck: Normal range of motion. Neck supple. No JVD present. No thyromegaly present.  Cardiovascular: Normal  rate, regular rhythm, normal heart sounds and intact distal pulses.  Exam reveals no gallop and no friction rub.   No murmur heard. Pulmonary/Chest: Effort normal and breath sounds normal. No respiratory distress. She has no wheezes. She has no rales.  Abdominal: Soft. Bowel sounds are normal. She exhibits no distension and no mass. There is no tenderness. There is no rebound and no guarding.  Musculoskeletal: Normal range of motion. She exhibits no edema and no tenderness.  Lymphadenopathy:    She has no cervical adenopathy.  Neurological: She is alert and oriented to person, place, and time.  Psychiatric: She has a normal mood and affect. Her behavior is normal.          Assessment & Plan:

## 2010-08-02 NOTE — Patient Instructions (Addendum)
Diverticulitis A diverticulum is a small pouch or sac on the colon. Diverticulosis is the presence of these diverticula on the colon. Diverticulitis is the irritation (inflammation) or infection of diverticula. CAUSES The colon and its diverticula contain germs (bacteria). If food particles block the tiny opening to a diverticulum, the bacteria inside can grow and cause an increase in pressure. This leads to infection and inflammation. SYMPTOMS  Belly (abdominal) pain and tenderness. Usually, the pain is located on the left side of your abdomen. However, it could be located elsewhere.   Fever.   Bloating.   Feeling sick to your stomach (nausea).   Throwing up (vomiting).   Abnormal stools.  DIAGNOSIS Your caregiver will take a history and perform a physical exam. Since many things can cause abdominal pain, other tests may be necessary. Tests may include:  Blood tests.  Urine tests.   X-ray of the abdomen.  CT scan of the abdomen.   Sometimes, surgery is needed to determine if diverticulitis or other conditions are causing your symptoms. TREATMENT Most of the time, you can be treated without surgery. Treatment includes:  Resting the bowels by only having liquids for a few days.   Intravenous (IV) fluids if you are losing fluids (dehydrated).   Medicines (antibiotics) that kill germs may be given.   Pain and nausea medicine, if needed.   As you improve, eating soft, easily digestible foods.   Surgery if the inflamed diverticulum has burst.  HOME CARE INSTRUCTIONS  Take all medicine as directed by your caregiver.   Try a clear liquid diet (broth, tea, or water, or as directed by your caregiver). You may then gradually begin a bland diet as tolerated.   You may be put on a high-fiber diet. Avoid nuts and seeds. Follow your caregiver's diet recommendations.   If your caregiver has given you a follow-up appointment, it is very important that you go. Not going could result  in lasting (chronic) or permanent injury, pain, and disability. If there is any problem keeping the appointment, call to reschedule.  SEEK MEDICAL CARE IF:  Pain does not improve.   You have a hard time advancing your diet beyond clear liquids.   Bowel movements do not return to normal.  SEEK IMMEDIATE MEDICAL CARE IF:  The pain becomes worse.   You have an oral temperature above 101F, not controlled by medicine.   Repeated vomiting occurs.   You have bloody or black, tarry stools.   Symptoms that brought you to your caregiver become worse or are not getting better.  MAKE SURE YOU:  Understand these instructions.   Will watch your condition.   Will get help right away if you are not doing well or get worse.  Document Released: 01/23/2005 Document Re-Released: 07/10/2009 [facility] Patient Information 2011 [place], [place].   You need to make an appointment with your cancer doctor Dr Dalene Carrow for a follow up appointment with her oncologist. You have been prescribed a pain medication which you should take with food. If you feel the pain is worse or not better in 7-10 days, make an appointment with me by calling clinic.

## 2010-08-02 NOTE — Assessment & Plan Note (Signed)
Patient is currently having increased fatigue and also complaining of anorexia. Patient was last seen by her hematologist 7 months ago. The most recent SPEP the chest worsening of the gammopathy. I would ask K. to make an appointment earlier than her scheduled appointment in 5 months.

## 2010-08-02 NOTE — Assessment & Plan Note (Addendum)
I would stop patient's loratadine at this time since patient also feels weak and dizzy lately.. A benzo and 77 year old female can definitely contributing to weakness. Patient is currently on an SSRI and I would continue using that for anxiety.

## 2010-09-11 NOTE — Discharge Summary (Signed)
NAME:  Rachel Bridges, Rachel Bridges              ACCOUNT NO.:  [id number]   MEDICAL RECORD NO.:  [id number]          PATIENT TYPE:  INP   LOCATION:  5121                         FACILITY:  [facility]   PHYSICIAN:  Mick Sell, MD DATE OF BIRTH:  October 04, 1924   DATE OF ADMISSION:  Jul 06, 2007  DATE OF DISCHARGE:  06/15/2007                               DISCHARGE SUMMARY   CHIEF COMPLAINT:  Abdominal distention, nausea and vomiting.   DISCHARGE DIAGNOSES:  1. Partial small bowel obstruction.  2. Hypokalemia, resolved.  3. Hypomagnesemia, resolved.  4. Diabetes, diet-controlled.  5. History of small bowel obstruction secondary to adhesions from      colon cancer surgery.  6. History of colon cancer, status post colectomy 2001.  7. Psoriasis.  8. Depression and anxiety.  9. Hyperlipidemia.  10.Osteopenia.  11.History of appendectomy.  12.History of lumbar laminectomy.  13.Lumbar spinal stenosis.  14.History of diverticulosis.  15.History of atrophic vaginitis.  16.History of tobacco abuse.  17.Chronic urinary incontinence.   DISCHARGE MEDICATIONS:  1. Lexapro 20 mg daily.  2. Os-Cal daily.  3. Alprazolam 0.5 mg daily.  4. Protonix 40 mg daily.  5. Colace 100 mg two times per day.  6. MiraLax daily.   DISPOSITION AND FOLLOW-UP:  The patient will follow up with the [facility] with her primary care doctor, Hollace Hayward,  M.D., number [phone].  We will call and schedule the patient an  appointment prior to discharge.  The patient was requesting information  regarding a home diet to try and help with preventing these small bowel  obstructions and the dietitian was to see the patient prior to  discharge.  If the patient still has questions, she may need outpatient  referral to a dietitian.  If the patient continues to complain of stress  urinary incontinence, she has seen a gynecologist in the past and had a  bladder tack and may need outpatient referral for this as  well.  Otherwise, no changes were made to the patient as an inpatient.  She can  continue to follow up with Dr. Loreta Ave and Dr. Kae Heller as scheduled  prior.   STUDIES:  Acute abdominal series 2007-07-06.  Impression:  Multiple dilated small bowel loops, gaseous distention in the right  upper quadrant concerning for partial small bowel obstruction.  Similar  configuration to May 19, 2006.   CULTURES:  None.   CONSULTATIONS:  [facility], seen by [place]. Daphine Deutscher, MD.   BRIEF HISTORY AND PHYSICAL:  Rachel Bridges is an 77 year old white female  with past medical history of colon cancer, status post resection in  2001, and now recurrent small bowel obstruction occurring three times.  These are secondary to adhesions and the last occurred in January 2008.  The patient also has a history of diabetes, which is diet-controlled,  and hyperlipidemia.  The patient presented to the ED with abdominal  distention and gas.  The patient ate on the night prior to admission  and felt very gassy overnight.  The patient's family reports she was  belching a lot.  The patient  awoke in the morning and vomited x1.  When  the patient's daughter saw her abdomen that morning, they felt she was  distended and since she has had recurrent episodes of small bowel  obstruction in the past, recognized the distention and came immediately  to the ED.  The patient denied any abdominal pain currently, no nausea  or vomiting, and really no other complaints other than feeling gassy.  The patient has chronic diarrhea and is on MiraLax and Colace to prevent  a recurrent small bowel obstruction and saw Dr. Laveda Abbe in the clinic  one day prior to admission and her abdomen was nondistended at that  time.  The patient had no other complaints of fevers, chills, other  constitutional symptoms.   ALLERGIES:  None.   PAST MEDICAL HISTORY:  See that dictated above.   MEDICATIONS:  See the dictated.  No  changes made.   SOCIAL HISTORY:  The patient is a current smoker.  She does not use any  drugs or alcohol and lives with her daughter.   REVIEW OF SYSTEMS:  Negative except as in HPI.   PHYSICAL EXAM:  Temperature on admission 97.1, blood pressure 107/67,  pulse 81, respiratory rate 18, saturating 95% on room air.  GENERAL:  The patient was in no acute distress, alert and oriented x3.  EYES:  Pupils even, equally reactive to light and accommodation.  Extraocular movements intact.  Anicteric.  Mucous membranes moist.  No  oropharyngeal erythema or exudate.  NECK:  Supple.  RESPIRATORY:  Clear to auscultation bilaterally.  CARDIOVASCULAR:  Regular rate and rhythm.  No murmurs, rubs or gallops.  She has some moderate distention, a hypertympanic abdomen; however, it  was not tight.  The patient had positive bowel sounds that were normal.  GENITOURINARY:  No CVA tenderness.  EXTREMITIES:  No edema.  SKIN:  No rashes.  LYMPHATIC:  No lymphadenopathy.  MUSCULOSKELETAL:  Normal strength and tone.  NEUROLOGIC:  Nonfocal.  PSYCHIATRIC:  Appropriate.   ADMISSION LABORATORY DATA:  CBC:  White count 7.6, hemoglobin 12.3,  platelets 243.  Electrolytes:  Sodium 137, potassium 3.2, chloride 101,  bicarb 27, BUN 13, creatinine 0.9, and glucose 186.  Abdominal x-ray was  obtained and showed dilated small bowel loops, especially in the right  upper quadrant, concerning for partial SBO.  Urinalysis was negative  except for 7-10 white blood cells and rare bacteria.   HOSPITAL COURSE BY PROBLEM:  Problem 1.  SMALL BOWEL OBSTRUCTION:  The patient has moderate  distention and feels very gaseous.  She has good bowel sounds, no  abdominal tenderness, no tightness.  Abdominal x-ray shows partial small  bowel obstruction; therefore, we placed an NG tube and kept the patient  n.p.o.  We continued her on MiraLax and Colace.  [facility] was consulted and saw the patient and this was also the   expressed by the patient's family, who stated that [facility] told them in the  past that if this SBO recurred again they would consider lysis of  adhesions.  However, since the patient was asymptomatic, no surgery was  necessary at this time.  EKG was obtained for any possible necessary  surgery and was unchanged from previous studies.  The patient was kept  n.p.o. with NG tube on the day of admission and continued overnight.  One day after admission the patient felt very good.  This NG tube was  discontinued and the patient was allowed to eat a diet, starting  with  clear liquids.  The diet was advanced as tolerated and the patient had  no complaints.  The patient suffered no further nausea, vomiting or  abdominal pain.  The patient was discharged home after tolerating a full  diet.  At discharge the patient will continue her laxatives to try and  keep her stools soft to prevent SBO.  The patient also will continue on  a high-fiber diet.  Requested speaking with the dietitian prior to  discharge to help give her recipes to continue on this high-fiber diet  at home.   Problem 2.  DIABETES:  The patient was started on sliding scale insulin  but presented on no diabetes medications.  The patient's A1c was checked  and was 5.5 with glucose 186 on admission.  However, her diabetes seems  to be diet-controlled.   Problem 3.  ANXIETY AND DEPRESSION:  The patient was continued on  Lexapro and Xanax and was stable through her hospital course.   Problem 4.  HYPOKALEMIA:  The patient presented with a potassium which  was low at 3.2.  It was repleted IV and was stable at 3.5 on the day  after admission.  We continued to replete this and checked a magnesium  level.  Magnesium was low at 1.5 and magnesium was repleted as well.  On  the day of discharge potassium was 4.2.  Potassium was low likely  secondary to GI losses.   Problem 5.  HYPOMAGNESEMIA:  Magnesium level was checked and was low at  1.5, and  this was repleted.   Problem 6.  URINARY INCONTINENCE:  The patient prior to discharge  complained of urinary incontinence, although she states this is a  chronic problem and occurs at home.  The patient was seen by GYN in the  past and states that he did a bladder tacking.  We will defer workup  of this problem to the outpatient setting.  Dr. Laveda Abbe can refer the  patient back to her gynecologist should he so choose if this continues  to be a problem.   DISCHARGE LABS AND VITALS:  Temperature 97.7, pulse 68, respiratory rate  19, blood pressure 140/82, saturating 96% on room air.   Labs:  CBC:  White count 6.9, hemoglobin 11.2, platelets 198.  Electrolytes:  Sodium 139, potassium 4.2, chloride 108, bicarb 26, BUN  5, creatinine 0.7 and glucose 110.  Calcium was 8.2.      Tacey Ruiz, MD  Electronically Signed      Mick Sell, MD  Electronically Signed    JP/MEDQ  D:  06/15/2007  T:  06/16/2007  Job:  [id number]   cc:   Hollace Hayward, M.D.  Anselmo Rod, M.D.

## 2010-09-14 NOTE — Discharge Summary (Signed)
NAME:  Rachel Bridges, Rachel Bridges              ACCOUNT NO.:  [id number]   MEDICAL RECORD NO.:  [id number]          PATIENT TYPE:  INP   LOCATION:  6733                         FACILITY:  [facility]   PHYSICIAN:  Alvester Morin, M.D.  DATE OF BIRTH:  10-24-1924   DATE OF ADMISSION:  01/18/2008  DATE OF DISCHARGE:  01/22/2008                               DISCHARGE SUMMARY   DISCHARGE DIAGNOSES:  1. Orthostatic hypotension.  2. Frequent falls.  3. B12 deficiency.  4. Lytic lesion of the skull.  5. Unintentional weight loss.  6. History of colon cancer with obstruction, status post colectomy by      Dr. Abbey Chatters in 2001.  7. Bilateral lower extremity edema.  8. Chronic diarrhea followed by Dr. Loreta Ave with recent colonoscopy,      unrevealing.  9. Hyperlipidemia.  10.Alzheimer disease.  11.Gastroesophageal reflux disease.  12.Depression and anxiety.  13.Osteopenia.  14.Diverticulosis with history of diverticulitis.  15.Diabetes, diet controlled.  16.Code status; DNR.   DISCHARGE MEDICATIONS:  1. Aspirin 81 mg p.o. daily  2. Remeron 7.5 mg p.o. at bedtime, then increase to 15 mg p.o. at      bedtime.  3. Zocor 40 mg p.o. at bedtime.  4. Vitamin B12 injections weekly for a week and then daily for 5 days,      then weekly for 4 weeks, and then monthly.  5. Aricept 5 mg p.o. daily.  6. Omeprazole 20 mg p.o. daily.  7. Xanax 0.5 mg p.o. daily for 5 days and then discontinue.   DISPOSITION AND FOLLOWUP:  The patient is to follow up with the  outpatient clinic in 2 weeks after discharge.  At that time, the  patient's SPEP and UPEP need to be followed up along with her vitamin D  level.  She also had requested to see Dr. Olga Millers at [facility] because that was  her husband's oncologist regarding this lytic lesion of her skull and we  need to make sure that referral appointment has been placed.   CONSULTATIONS:  No consultations were obtained.   PROCEDURES:  A bone scan was performed during this  hospitalization and  noted a left frontal focal uptake within the left frontal bone  correlating to the abnormal finding on CT scan.  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   ADMISSION HISTORY AND PHYSICAL:  The patient is a very pleasant 77-year-  old female with past medical history of colon cancer, diabetes, and  dementia who presented to the outpatient clinic the day of admission  complaining of frequent falls over the last week.  She was unable to  give specifics of the event but denied pain anywhere and syncope.  Her  family notes that they were around and do not think that she had a  syncopal event or no witnessed seizure-like activity.  She denies  fevers, chills, chest pain, or shortness of breath.  She does note  increasing bilateral lower extremity edema over the last 1-2 months.  She has also had a weight loss of 15 pounds in the last year that is  unintentional.  She also notes multiple light-colored watery stools a  day and follows up with Dr. Loreta Ave in Gastroenterology for this.  She also  notes dizziness that occurs sometimes when she stands up.   PHYSICAL EXAMINATION:  ADMISSION VITAL SIGNS:  Temperature 97.2; blood  pressure lying 140/70 with a heart rate of 75, sitting 126/76 and heart  rate is 64, and standing 111/75 and heart rate 65; respiratory rate 18;  and O2 saturation 97% on room air.  GENERAL:  She is alert and oriented to person, month, date, but not year  and place.  HEENT:  Extraocular motions intact.  Anicteric.  ENT exam intact mucous  membranes, edentulous.  NECK:  Supple.  No nuchal rigidity.  A 1 cm palpable lymph node noted on  the left neck.  RESPIRATORY:  Clear to auscultation bilaterally with normal respiratory  effort.  No crackles.  CARDIOVASCULAR:  Regular rate and rhythm, systolic ejection murmur 2/6  best heard at the mitral area.  No rubs or gallops.  GI:  Good bowel sounds.  Soft, nontender, and nondistended.  Well-healed   incision noted.  EXTREMITIES:  Pitting 1+ lower extremity edema with significant  ecchymosis on the left shin and 2+ dorsalis pedis pulses.  SKIN:  She had multiple scabs on her face and chest.  MUSCULOSKELETAL:  Strength 5/5 in all extremities.  NEURO:  Cranial nerves II through XII are intact.  Strength was intact.   ADMISSION LABORATORY DATA:  Sodium 142, potassium 3.5, chloride 108,  bicarb 29, BUN 11, creatinine 0.75, bilirubin 0.4, alkaline phosphatase  88, AST 18, ALT 15, total protein 6.7, albumin 2.7, and calcium 8.4.  White blood cell count 5.2, absolute neutrophil count 3.2, hemoglobin  12.2, MCV 89, and platelet count 212.  Cardiac enzymes; CK 31, CK-MB  1.0, and troponin less than 0.01.  Sed rate 40.  Cortisol random at  1600, 920, B12 of 169, TSH 2.41, and hemoglobin A1c 5.2.  HDL 25, LDL  63, triglycerides 77, and magnesium 2.3.   DIAGNOSTIC IMAGING:  1. CT of the head without contrast showed:      a.     Generalized atrophy.      b.     No acute intracranial abnormalities.      c.     Calvarial lucencies of uncertain etiology, cannot exclude       metastatic disease.  Recommend radionuclide bone scan.  2. Bone scan:  Focal uptake within the left frontal bone correlates to      the abnormal findings on CT, concerning for a solitary bone      metastasis.  3. EKG:  Wide QRS with right bundle-branch block with T-wave inversion      in V1 through V4 and overall not unchanged from previous EKGs.   HOSPITAL COURSE:  By problem:  1. Orthostatic hypotension.  The patient was noted to be orthostatic      and had dizziness with standing.  It was felt this may contributed      to her frequent falls.  She was given IV fluids, and her symptoms      improved.  It may be related to volume depletion secondary to her      frequent diarrhea episodes.  She may have some autonomic      insufficiency because of her previous history of diabetes.      Physical therapy and occupational  therapy were consulted and      recommended 24-hour home assistant  and her family was willing to      provide that for her.  She did not fall during this      hospitalization.  2. Frequent falls, likely related to orthostatic hypotension.  Please      see above.  Potential medications that could be contributing, such      as xanax, were adjusted.  Workup for frequent falls included      cardiac enzymes that were negative x3, monitoring on telemetry and      she had no events on telemetry, electrolytes were found to be      within normal limits and CT of the head was done.  CT of the head,      she was noted to have a lytic lesion and followup bone scan was      done.  The bone scan indicated that a lytic lesion was noted and      concerning for metastasis and this was discussed extensively with      her family who would prefer for her to follow up with her deceased      husband's oncologist at [facility] and they would like to make an      appointment.  An SPEP and UPEP were drawn because there was concern      for multiple myeloma and will need to be followed up in the      outpatient setting.  3. B12 deficiency.  Vitamin B12 level was checked secondary to her      frequent falls and it was felt like she may have a peripheral      neuropathy contributing and was found to be low at 169 and this      will be repleted.  4. Lytic lesion of the skull.  The patient was noted to have a lytic      lesion in the skull and a followup bone scan confirmed this and was      suspicious for metastatic process.  An SPEP and UPEP were drawn      because there was concern for multiple myeloma, and the patient      would like to have this followed up with her husband's oncologist,      Dr. Olga Millers at [facility].  5. Unintentional weight loss.  This may be related to her chronic      diarrhea or potential malignancy.  She was provided with Ensure      supplementation and encouraged to use this in the outpatient       setting because of her protein calorie malnutrition.  6. Bilateral lower extremity edema.  This may be secondary to her low      albumin.  She did not demonstrate any evidence of heart failure on      exam.  She also did not have any evidence of a DVT at this time and      recently had a lower extremity Doppler to work this up about 3      weeks prior to admission that was negative for DVT.  She may      benefit from an echo in the outpatient setting to further evaluate      for cardiac etiology.  7. Depression and anxiety.  The patient was noted to have significant      depressive symptoms on exam and had a history of anxiety disorder      and was on chronic Xanax.  We discussed this with her  and decided      that she may benefit from the discontinuation of her Xanax given      her history of frequent falls.  We added Remeron at night because      it felt like this may help with her depressive symptoms, her weight      loss, and her anxiety and started on that medication at night and      did not have any problems.  8. Osteopenia.  The patient had a vitamin D level drawn prior to      discharge and this will need to be followed up in the outpatient      setting and may need repletion along with calcium supplementation      and possibly bisphosphonate.  9. Code status, DNR.  Extensive discussion was had between the patient      and her daughter and they degree that the patient would not want      any resuscitative efforts made during this hospitalization.   DISCHARGE VITALS:  Temperature 98.4, blood pressure 106/61, heart rate  70, respiratory rate 20, and O2 saturation 97% on room air.   BMET; sodium 143, potassium 3.6, chloride 111, bicarb 28, BUN 7,  creatinine 0.74, glucose 91, and calcium 8.0.  White blood cell count  4.8, hemoglobin 11.2, MCV 88, and platelet count 161.      Joaquin Courts, MD  Electronically Signed      Alvester Morin, M.D.  Electronically Signed    VW/MEDQ  D:  01/27/2008  T:  01/28/2008  Job:  [id number]   cc:   Dr. Olga Millers

## 2010-09-14 NOTE — Procedures (Signed)
[place]. [facility]  Patient:    Rachel Bridges, Rachel Bridges Visit Number: [id number] MRN: [id number]          Service Type: END Location: ENDO Attending Physician:  Charna Elizabeth Dictated by:   Anselmo Rod, M.D. Proc. Date: 01/21/01 Admit Date:  01/21/2001   CC:         Adolph Pollack, M.D.   Procedure Report  DATE OF BIRTH:  Jan 16, 1925  REFERRING PHYSICIAN:  Adolph Pollack, M.D.  PROCEDURE PERFORMED:  Colonoscopy with snare polypectomy x 3.  ENDOSCOPIST:  Anselmo Rod, M.D.  INSTRUMENT USED:  Olympus video colonoscope.  INDICATIONS FOR PROCEDURE:  The patient is a 77 year old white female who has had right hemicolectomy for a large mass at 110 cm diagnosed in August of last year.  Patient undergoing a repeat colonoscopy for colorectal cancer screening.  PREPROCEDURE PREPARATION:  Informed consent was procured from the patient. The patient was fasted for eight hours prior to the procedure and prepped with a bottle of magnesium citrate and a gallon of NuLytely the night prior to the procedure.  PREPROCEDURE PHYSICAL:  The patient had stable vital signs.  Neck supple. Chest clear to auscultation.  S1, S2 regular.  Abdomen soft with normal abdominal bowel sounds.  Well-healed surgical scar from previous surgery.  DESCRIPTION OF PROCEDURE:  The patient was placed in the left lateral decubitus position and sedated with 40 mg of Demerol and 4 mg of Versed intravenously.  Once the patient was adequately sedated and maintained on low-flow oxygen and continuous cardiac monitoring, the Olympus video colonoscope was advanced from the rectum to the anastomosis with slight difficulty secondary to some residual stool in the colon.  A healthy anastomosis was seen at 98 cm.  Three small sessile polyps were snared from 15 to 18 cm.  There was prominent internal hemorrhoids with a small polyp seen in the hemorrhoidal plexus.  This may be a small  anal polyp.  Some scattered diverticulosis was also noted.  IMPRESSION: 1. Three small sessile polyps snared from 15 to 18 cm. 2. Scattered diverticulosis. 3. Prominent internal hemorrhoid with questionable anal polyp. 4. Healthy anastomosis at 98 cm.  Distal small bowel appeared normal.  RECOMMENDATIONS: 1. Await pathology results.  Follow up with Dr. Abbey Chatters for excision of the    anal polyp. 2. Outpatient follow-up on a p.r.n. basis as need arises. 3. Repeat colorectal cancer screening in the next three years or earlier if    need be. 4Dictated by:   Anselmo Rod, M.D. Attending Physician:  Charna Elizabeth DD:  01/21/01 TD:  01/21/01 Job: [id number]

## 2010-09-14 NOTE — Consult Note (Signed)
NAME:  Rachel Bridges, Rachel Bridges              ACCOUNT NO.:  [id number]   MEDICAL RECORD NO.:  [id number]          PATIENT TYPE:  INP   LOCATION:  5713                         FACILITY:  [facility]   PHYSICIAN:  Anselmo Rod, M.D.  DATE OF BIRTH:  1924/10/23   DATE OF CONSULTATION:  DATE OF DISCHARGE:                                 CONSULTATION   REASON FOR CONSULTATION:  Partial small bowel obstruction, history of  IBS.   This is a 77 year old white female who was discharged 2 weeks ago  following an episode of small bowel obstruction.  She was seen in July  2001 for rectal bleeding.  Subsequently a colonoscopy following revealed  a right colon adenocarcinoma.  This resulted in a right hemicolectomy  done by Dr. Abbey Chatters.  The patient developed a small bowel obstruction  prior to surgery and she had thios happen on several occasions since her  right hemicolectomy. Her past history is significant for  IBS, status  post right colectomy for stage-I colon cancer, GERD, diverticulosis,  adult-onset diabetes, UTI, status post open cholecystectomy,  appendectomy, status post depression, and she has a family history of  stomach and colon cancer.   MEDICATIONS:  Included Protonix and Celexa.   SURGICAL HISTORY:  Right colectomy in 2001, status post small bowel  obstruction requiring small bowel/colon bypass, open cholecystectomy,  lumbar fusion, and left total knee replacement.   ALLERGIES:  MORPHINE.   SOCIAL HISTORY:  Significant for smoking one pack per day for 50 years;  she quit about 18 years ago.  Denies any use of alcohol.   FAMILY HISTORY:  Her father had cancer, according to the patient  possibly gut cancer, died at 81.  Her brother had stomach cancer.  Several family members have hypertension, coronary artery disease and  diabetes.   REVIEW OF SYSTEMS:  GENERAL:  She denies fevers or chills.  RESPIRATORY:  No shortness of breath, cough or wheezing.  CARDIAC:  No chest pain or  palpitations.  MUSCULOSKELETAL:  She has chronic back pain and some  radiculopathy.  Well-healed incisions.  EXTREMITIES:  No clubbing,  cyanosis.  No swelling.  NEUROLOGIC:  She is alert and oriented.  Motor  and sensory exams are intact.   LABORATORY DATA:  WBC of 5.7, H&H 11 and 32.9, platelets of 253.  Electrolytes:  Sodium 140, potassium 3.7, chloride 102, bicarb 32, BUN  11, creatinine 0.5, glucose of 129.  LFTs are all normal except for  albumin of 2.4, calcium of 8.7, phosphate of 3.3.  The flat and upright  abdominal x-ray reviewed that showed dilated small bowel with one  particularly large loop in the right upper quadrant and some air-filled  levels consistent with small bowel obstruction.   ASSESSMENT:  1. Small bowel obstruction.  2. Irritable bowel syndrome.  3. Gastroesophageal reflux disease.  4. Diverticulosis.  5. Status post right colectomy for stage-I colon cancer.   RECOMMENDATIONS:  1. Laparoscopic evaluation considering her multiple episodes of small      bowel obstruction and abnormal small bowel follow through. This was      discussed  with Dr. Abbey Chatters and Dr. Corliss Skains.  2. Proton pump inhibitor IV.  3. NG tube.  4. Will correct electrolytes.     ______________________________  Dr. Isaiah Blakes, M.D.  Electronically Signed    Hadley Pen  D:  05/20/2006  T:  05/20/2006  Job:  [id number]   cc:   Thereasa Solo, M.D.  Adolph Pollack, M.D.

## 2010-09-14 NOTE — Op Note (Signed)
NAME:  KYNZLEE, HUCKER              ACCOUNT NO.:  [id number]   MEDICAL RECORD NO.:  [id number]          PATIENT TYPE:  AMB   LOCATION:  ENDO                         FACILITY:  [facility]   PHYSICIAN:  Anselmo Rod, M.D.  DATE OF BIRTH:  March 05, 1925   DATE OF PROCEDURE:  08/11/2005  DATE OF DISCHARGE:  08/09/2005                                 OPERATIVE REPORT   PROCEDURE PERFORMED:  Screening colonoscopy.   ENDOSCOPIST:  Anselmo Rod, M.D.   INSTRUMENT USED:  Olympus video colonoscope.   INDICATIONS FOR PROCEDURE:  A 77 year old white female with a history of  adenocarcinoma at the hepatic flexure, status post right hemicolectomy  undergoing a colonoscopy to rule out polyps, masses, etc.   PREPROCEDURE PREPARATION:  Informed consent was procured from the patient.  The patient was fasted for four hours prior to the procedure and prepped  with OsmoPrep pills the night of and the morning of the procedure.  The  risks and benefits of the procedure including a 10% miss rate for cancer or  polyps was discussed with the patient as well.   PREPROCEDURE PHYSICAL:  The patient had stable vital signs.  Neck supple.  Chest clear to auscultation.  S1 and S2 regular.  Abdomen soft with normal  bowel sounds.   DESCRIPTION OF PROCEDURE:  The patient was placed in left lateral decubitus  position and sedated with 100 mcg of fentanyl and 3 mg of Versed in slow  incremental doses.  Once the patient was adequately sedated and maintained  on low flow oxygen and continuous cardiac monitoring, the Olympus video  colonoscope was advanced from the rectum to the anastomosis with difficulty.  There was a large amount of residual stool in the colon.  In spite of  multiple washings, visualization was not adequate.  Small lesions could have  been missed.  There was evidence of pandiverticulosis.  The anastomosis was  clearly identified.  Retroflexion in the rectum revealed no abnormalities.  The patient  tolerated the procedure well without complication.   IMPRESSION:  1.  Pandiverticulosis.  2.  No masses or polyps seen.  3.  Large amount of residual stool in the colon.  Small lesions could be      missed.   RECOMMENDATIONS:  1.  Continue a high fiber diet with liberal fluid intake.  2.  Proceed with a urological evaluation with regard to large renal cyst.  3.  I do not think the patient is able to tolerate a prepping because of her      age and concurrent medical problems.  Therefore, I would not plan to      prep her in the near future unless there are any symptoms that prompt      further evaluation.      Anselmo Rod, M.D.  Electronically Signed     JNM/MEDQ  D:  08/11/2005  T:  08/12/2005  Job:  [id number]   cc:   Donald Pore, MD  Fax: [phone]   Adolph Pollack, M.D.  [address], [address]  [place]  [place] [address]

## 2010-09-14 NOTE — H&P (Signed)
NAME:  Rachel Bridges, Rachel Bridges NO.:  [id number]   MEDICAL RECORD NO.:  [id number]          PATIENT TYPE:  INP   LOCATION:  5713                         FACILITY:  [facility]   PHYSICIAN:  Anselmo Rod, M.D.  DATE OF BIRTH:  [birth year]   DATE OF ADMISSION:  05/14/2006  DATE OF DISCHARGE:                              HISTORY & PHYSICAL   This is a 77 year old, white female who was seen in July 2001 for   Dictation ended at this point.     ______________________________  DR. PATEL      Anselmo Rod, M.D.     Hadley Pen  D:  05/20/2006  T:  05/20/2006  Job:  [id number]

## 2011-01-11 ENCOUNTER — Other Ambulatory Visit: Payer: Self-pay | Admitting: Internal Medicine

## 2011-01-21 LAB — DIFFERENTIAL
Basophils Relative: 0
Eosinophils Absolute: 0
Lymphs Abs: 1.2
Neutro Abs: 6
Neutrophils Relative %: 80 — ABNORMAL HIGH

## 2011-01-21 LAB — URINALYSIS, ROUTINE W REFLEX MICROSCOPIC
Glucose, UA: NEGATIVE
Ketones, ur: 15 — AB
Nitrite: NEGATIVE
Protein, ur: 30 — AB
Urobilinogen, UA: 1

## 2011-01-21 LAB — CBC
HCT: 33.7 — ABNORMAL LOW
HCT: 35 — ABNORMAL LOW
Hemoglobin: 11.5 — ABNORMAL LOW
MCHC: 32.8
MCHC: 33.3
MCV: 86.4
MCV: 87.2
Platelets: 243
RBC: 3.87
RDW: 15.3
WBC: 6.1
WBC: 7.6

## 2011-01-21 LAB — COMPREHENSIVE METABOLIC PANEL
ALT: 10
Albumin: 3 — ABNORMAL LOW
Alkaline Phosphatase: 73
GFR calc Af Amer: >60
Potassium: 3.4 — ABNORMAL LOW
Sodium: 141
Total Protein: 7.1

## 2011-01-21 LAB — BASIC METABOLIC PANEL
BUN: 5 — ABNORMAL LOW
CO2: 26
CO2: 28
Chloride: 108
GFR calc non Af Amer: >60
Glucose, Bld: 112 — ABNORMAL HIGH
Potassium: 3.5
Potassium: 4.2
Sodium: 141

## 2011-01-21 LAB — URINE CULTURE: Colony Count: 35000

## 2011-01-21 LAB — URINE MICROSCOPIC-ADD ON

## 2011-01-21 LAB — I-STAT 8, (EC8 V) (CONVERTED LAB)
BUN: 13
Bicarbonate: 27.3 — ABNORMAL HIGH
Glucose, Bld: 186 — ABNORMAL HIGH
Hemoglobin: 12.9
Potassium: 3.2 — ABNORMAL LOW
Sodium: 137

## 2011-01-21 LAB — LIPID PANEL
Cholesterol: 125
HDL: 28 — ABNORMAL LOW
Total CHOL/HDL Ratio: 4.5
Triglycerides: 125

## 2011-01-28 LAB — BASIC METABOLIC PANEL
BUN: 8
BUN: 9
CO2: 27
CO2: 28
CO2: 28
CO2: 29
Calcium: 8 — ABNORMAL LOW
Calcium: 8.1 — ABNORMAL LOW
Chloride: 110
Chloride: 111
Chloride: 112
Creatinine, Ser: 0.69
GFR calc Af Amer: >60
GFR calc Af Amer: >60
GFR calc Af Amer: >60
GFR calc non Af Amer: >60
GFR calc non Af Amer: >60
Glucose, Bld: 101 — ABNORMAL HIGH
Glucose, Bld: 89
Glucose, Bld: 96
Potassium: 3.6
Potassium: 3.7
Potassium: 3.9
Sodium: 141
Sodium: 142
Sodium: 143

## 2011-01-28 LAB — UIFE/LIGHT CHAINS/TP QN, 24-HR UR
Albumin, U: DETECTED
Alpha 2, Urine: DETECTED — AB
Beta, Urine: DETECTED — AB
Gamma Globulin, Urine: DETECTED — AB
Total Protein, Urine: 3.9

## 2011-01-28 LAB — URINALYSIS, ROUTINE W REFLEX MICROSCOPIC
Glucose, UA: NEGATIVE
Ketones, ur: NEGATIVE
Nitrite: NEGATIVE
Protein, ur: NEGATIVE
Urobilinogen, UA: 0.2

## 2011-01-28 LAB — CBC
HCT: 31.8 — ABNORMAL LOW
HCT: 33.7 — ABNORMAL LOW
HCT: 33.9 — ABNORMAL LOW
HCT: 37.3
Hemoglobin: 10.7 — ABNORMAL LOW
Hemoglobin: 11.1 — ABNORMAL LOW
Hemoglobin: 11.2 — ABNORMAL LOW
Hemoglobin: 12.2
MCHC: 32.7
MCHC: 32.8
MCHC: 33.2
MCHC: 33.8
MCV: 87.5
MCV: 89.3
Platelets: 151
Platelets: 161
RBC: 3.78 — ABNORMAL LOW
RDW: 14.6
RDW: 14.7
RDW: 14.9

## 2011-01-28 LAB — COMPREHENSIVE METABOLIC PANEL
AST: 18
CO2: 29
Calcium: 8.4
Creatinine, Ser: 0.75
GFR calc Af Amer: >60
GFR calc non Af Amer: >60

## 2011-01-28 LAB — CARDIAC PANEL(CRET KIN+CKTOT+MB+TROPI)
CK, MB: 1
CK, MB: 1.5
Relative Index: INVALID
Relative Index: INVALID
Total CK: 31

## 2011-01-28 LAB — URINE CULTURE

## 2011-01-28 LAB — SEDIMENTATION RATE: Sed Rate: 39 — ABNORMAL HIGH

## 2011-01-28 LAB — DIFFERENTIAL
Basophils Relative: 1
Eosinophils Absolute: 0.1
Eosinophils Relative: 3
Monocytes Absolute: 0.3
Monocytes Relative: 6

## 2011-01-28 LAB — PROTEIN ELECTROPH W RFLX QUANT IMMUNOGLOBULINS
Albumin ELP: 46.7 — ABNORMAL LOW
Beta 2: 7.6 — ABNORMAL HIGH
Beta Globulin: 7.6 — ABNORMAL HIGH
M-Spike, %: NOT DETECTED

## 2011-01-28 LAB — IMMUNOFIXATION ADD-ON

## 2011-01-28 LAB — IGG, IGA, IGM
IgA: 531 — ABNORMAL HIGH
IgM, Serum: 81

## 2011-01-28 LAB — LIPID PANEL
HDL: 25 — ABNORMAL LOW
Triglycerides: 77

## 2011-01-28 LAB — MISCELLANEOUS TEST

## 2011-01-28 LAB — HEMOGLOBIN A1C: Mean Plasma Glucose: 103

## 2011-01-29 LAB — CBC
HCT: 32.9 % — ABNORMAL LOW (ref 36.0–46.0)
Hemoglobin: 11.1 g/dL — ABNORMAL LOW (ref 12.0–15.0)
MCHC: 33.7 g/dL (ref 30.0–36.0)
MCV: 87.5 fL (ref 78.0–100.0)
RBC: 3.76 MIL/uL — ABNORMAL LOW (ref 3.87–5.11)
WBC: 6.3 10*3/uL (ref 4.0–10.5)

## 2011-01-29 LAB — CHROMOSOME ANALYSIS, BONE MARROW

## 2011-01-30 ENCOUNTER — Other Ambulatory Visit: Payer: Self-pay | Admitting: Hematology and Oncology

## 2011-01-30 ENCOUNTER — Encounter (HOSPITAL_BASED_OUTPATIENT_CLINIC_OR_DEPARTMENT_OTHER): Payer: Medicare Other | Admitting: Hematology and Oncology

## 2011-01-30 ENCOUNTER — Ambulatory Visit (HOSPITAL_COMMUNITY)
Admission: RE | Admit: 2011-01-30 | Discharge: 2011-01-30 | Disposition: A | Discharge status: Home / Self Care | Payer: Medicare Other | Source: Ambulatory Visit | Attending: Hematology and Oncology | Admitting: Hematology and Oncology

## 2011-01-30 DIAGNOSIS — D472 Monoclonal gammopathy: Secondary | ICD-10-CM

## 2011-01-30 DIAGNOSIS — Z96659 Presence of unspecified artificial knee joint: Secondary | ICD-10-CM | POA: Insufficient documentation

## 2011-01-30 LAB — CBC WITH DIFFERENTIAL/PLATELET
BASO%: 0.2 % (ref 0.0–2.0)
Basophils Absolute: 0 10*3/uL (ref 0.0–0.1)
Eosinophils Absolute: 0.1 10*3/uL (ref 0.0–0.5)
HCT: 34.9 % (ref 34.8–46.6)
HGB: 11.4 g/dL — ABNORMAL LOW (ref 11.6–15.9)
MCHC: 32.6 g/dL (ref 31.5–36.0)
MONO#: 0.3 10*3/uL (ref 0.1–0.9)
NEUT#: 5.6 10*3/uL (ref 1.5–6.5)
NEUT%: 79 % — ABNORMAL HIGH (ref 38.4–76.8)
WBC: 7.1 10*3/uL (ref 3.9–10.3)
lymph#: 1.1 10*3/uL (ref 0.9–3.3)

## 2011-02-01 LAB — COMPREHENSIVE METABOLIC PANEL
ALT: <8 U/L (ref 0–35)
BUN: 21 mg/dL (ref 6–23)
CO2: 20 mEq/L (ref 19–32)
Calcium: 8.3 mg/dL — ABNORMAL LOW (ref 8.4–10.5)
Chloride: 108 mEq/L (ref 96–112)
Creatinine, Ser: 1.3 mg/dL — ABNORMAL HIGH (ref 0.50–1.10)
Glucose, Bld: 158 mg/dL — ABNORMAL HIGH (ref 70–99)

## 2011-02-01 LAB — SPEP & IFE WITH QIG
Alpha-2-Globulin: 13.3 % — ABNORMAL HIGH (ref 7.1–11.8)
Gamma Globulin: 20.4 % — ABNORMAL HIGH (ref 11.1–18.8)
IgA: 590 mg/dL — ABNORMAL HIGH (ref 69–380)
IgG (Immunoglobin G), Serum: 1260 mg/dL (ref 690–1700)

## 2011-02-06 ENCOUNTER — Encounter (HOSPITAL_BASED_OUTPATIENT_CLINIC_OR_DEPARTMENT_OTHER): Payer: Medicare Other | Admitting: Hematology and Oncology

## 2011-02-06 DIAGNOSIS — D472 Monoclonal gammopathy: Secondary | ICD-10-CM

## 2011-02-06 DIAGNOSIS — R42 Dizziness and giddiness: Secondary | ICD-10-CM

## 2011-02-06 DIAGNOSIS — D649 Anemia, unspecified: Secondary | ICD-10-CM

## 2011-02-06 DIAGNOSIS — Z85038 Personal history of other malignant neoplasm of large intestine: Secondary | ICD-10-CM

## 2011-02-11 ENCOUNTER — Encounter: Payer: Self-pay | Admitting: Internal Medicine

## 2011-02-11 ENCOUNTER — Ambulatory Visit (INDEPENDENT_AMBULATORY_CARE_PROVIDER_SITE_OTHER): Payer: Medicare Other | Admitting: Internal Medicine

## 2011-02-11 VITALS — BP 122/72 | HR 61 | Temp 97.0°F | Wt 127.0 lb

## 2011-02-11 DIAGNOSIS — R6889 Other general symptoms and signs: Secondary | ICD-10-CM

## 2011-02-11 DIAGNOSIS — F329 Major depressive disorder, single episode, unspecified: Secondary | ICD-10-CM

## 2011-02-11 DIAGNOSIS — F411 Generalized anxiety disorder: Secondary | ICD-10-CM

## 2011-02-11 DIAGNOSIS — Z23 Encounter for immunization: Secondary | ICD-10-CM

## 2011-02-11 DIAGNOSIS — R269 Unspecified abnormalities of gait and mobility: Secondary | ICD-10-CM

## 2011-02-11 DIAGNOSIS — R627 Adult failure to thrive: Secondary | ICD-10-CM

## 2011-02-11 MED ORDER — CALCIUM CITRATE-VITAMIN D 315-200 MG-UNIT PO TABS: 1.0000 | ORAL_TABLET | Freq: Two times a day (BID) | ORAL | Status: DC

## 2011-02-11 MED ORDER — SERTRALINE HCL 25 MG PO TABS: 25.0000 mg | ORAL_TABLET | Freq: Every day | ORAL | Status: DC

## 2011-02-11 MED ORDER — METOCLOPRAMIDE HCL 5 MG PO TABS: 5.0000 mg | ORAL_TABLET | Freq: Three times a day (TID) | ORAL | Status: DC

## 2011-02-11 MED ORDER — CARBAMIDE PEROXIDE 6.5 % OT SOLN: 5.0000 [drp] | Freq: Two times a day (BID) | OTIC | Status: AC

## 2011-02-11 MED ORDER — MEGESTROL ACETATE 400 MG/10ML PO SUSP: 400.0000 mg | Freq: Two times a day (BID) | ORAL | Status: DC

## 2011-02-11 NOTE — Patient Instructions (Signed)
IMPORTANT: HOW TO USE THIS INFORMATION:  This is a summary and does NOT have all possible information about this product. This information does not assure that this product is safe, effective, or appropriate for you. This information is not individual medical advice and does not substitute for the advice of your health care professional. Always ask your health care professional for complete information about this product and your specific health needs.    VITAMIN D - ORAL    USES:  Vitamin D (ergocalciferol-D2, cholecalciferol-D3, alfacalcidol) is a fat-soluble vitamin that helps your body absorb calcium and phosphorus. Having the right amount of vitamin D, calcium, and phosphorus is important for building and keeping strong bones. Vitamin D is used to treat and prevent bone disorders (such as rickets, osteomalacia). Vitamin D is made by the body when skin is exposed to sunlight. Sunscreen, protective clothing, limited exposure to sunlight, dark skin, and age may prevent getting enough vitamin D from the sun. Vitamin D with calcium is used to treat or prevent bone loss (osteoporosis). Vitamin D is also used with other medications to treat low levels of calcium or phosphate caused by certain disorders (such as hypoparathyroidism, pseudohypoparathyroidism, familial hypophosphatemia). It may be used in kidney disease to keep calcium levels normal and allow normal bone growth. Vitamin D drops (or other supplements) are given to breast-fed infants because breast milk usually has low levels of vitamin D.    HOW TO USE:  Take vitamin D by mouth, usually once daily. Vitamin D is best absorbed when taken after a meal but may be taken with or without food. Alfacalcidol is usually taken with food. Follow all directions on the product package. If you are uncertain about any of the information, consult your doctor or pharmacist. If your doctor has prescribed this medication, take as directed by your doctor. Your dosage is  based on your medical condition, amount of sun exposure, diet, age, and response to treatment. Measure the liquid medication with the dropper provided, or use a medication-measuring spoon/device to make sure you have the correct dose. If you are taking the chewable tablet, chew the medication thoroughly before swallowing. Certain medications (bile acid sequestrants such as cholestyramine/colestipol, mineral oil, orlistat) can decrease the absorption of vitamin D. Take your doses of these medications as far as possible from your doses of vitamin D (at least 2 hours apart, longer if possible). It may be easiest to take vitamin D at bedtime if you are also taking these other medications. Ask your doctor or pharmacist how long you should wait between doses and for help finding a dosing schedule that will work with all your medications. Take this medication regularly to get the most benefit from it. To help you remember, take it at the same time each day. If your doctor has recommended that you follow a special diet (such as a diet high in calcium), it is very important to follow the diet to get the most benefit from this medication and to prevent serious side effects. Do not take other supplements/vitamins unless ordered by your doctor. If you think you may have a serious medical problem, get medical help right away.    SIDE EFFECTS:  Vitamin D at normal doses usually has no side effects. If you have any unusual effects, contact your doctor or pharmacist promptly. If your doctor has directed you to take this medication, remember that he or she has judged that the benefit to you is greater than the risk of side  effects. Many people using this medication do not have serious side effects. Too much vitamin D can cause harmful high calcium levels. Tell your doctor right away if any of these signs of high vitamin D/calcium levels occur: nausea/vomiting, constipation, loss of appetite, increased thirst, increased urination,  mental/mood changes, unusual tiredness. A very serious allergic reaction to this drug is rare. However, get medical help right away if you notice any symptoms of a serious allergic reaction, including: rash, itching/swelling (especially of the face/tongue/throat), severe dizziness, trouble breathing. This is not a complete list of possible side effects. If you notice other effects not listed above, contact your doctor or pharmacist. In the [place] - Call your doctor for medical advice about side effects. You may report side effects to FDA at 1-800-FDA-1088. In [place] - Call your doctor for medical advice about side effects. You may report side effects to [facility] [place] at [phone].    PRECAUTIONS:  Before taking vitamin D, tell your doctor or pharmacist if you are allergic to it; or if you have any other allergies. This product may contain inactive ingredients, which can cause allergic reactions or other problems. Talk to your pharmacist for more details. Before using this medication, tell your doctor or pharmacist your medical history, especially of: high calcium/vitamin D levels (hypercalcemia/hypervitaminosis D), difficulty absorbing nutrition from food (malabsorption syndrome), kidney disease, liver disease. Before having surgery, tell your doctor or dentist about all the products you use (including prescription drugs, nonprescription drugs, and herbal products). During pregnancy, doses of vitamin D greater than the recommended dietary allowance should be used only when clearly needed. Discuss the risks and benefits with your doctor. This medication passes into breast milk. Consult your doctor before breast-feeding.    DRUG INTERACTIONS:  Drug interactions may change how your medications work or increase your risk for serious side effects. This document does not contain all possible drug interactions. Keep a list of all the products you use (including prescription/nonprescription drugs and herbal products)  and share it with your doctor and pharmacist. Do not start, stop, or change the dosage of any medicines without your doctor's approval. Check the labels on all your prescription and nonprescription/herbal products (such as antacids, laxatives, vitamins) because they may contain calcium, magnesium, phosphate, or vitamin D. Ask your pharmacist about using those products safely. This medication may interfere with certain laboratory tests (including cholesterol tests), possibly causing false test results. Make sure laboratory personnel and all your doctors know you use this drug.    OVERDOSE:  If overdose is suspected, contact a poison control center or emergency room immediately. [place] residents can call the [place] National Poison Hotline at 8304119030. [place] residents can call a Technical sales engineer center. Symptoms of overdose may include: seizures, confusion, irregular heartbeat.    NOTES:  Keep all regular medical and laboratory appointments. If your doctor has prescribed this medication, laboratory and/or medical tests (such as calcium/magnesium/phosphorus levels) should be performed periodically to monitor your progress or check for side effects. Consult your doctor for more details. Foods rich in vitamin D include: fortified dairy products, eggs, sardines, cod liver oil, chicken livers, and fatty fish.    MISSED DOSE:  If you miss a dose, take it as soon as you remember. If it is near the time of the next dose, skip the missed dose and resume your usual dosing schedule. Do not double the dose to catch up.    STORAGE:  Store vitamin D products (except alfacalcidol) at room temperature away  from light and moisture. Do not store in the bathroom. Store alfacalcidol drops in the refrigerator between 36-46 degrees F (2-8 degrees C). Do not freeze. Keep all medications away from children and pets. Do not flush medications down the toilet or pour them into a drain unless instructed to do so. Properly discard  this product when it is expired or no longer needed. Consult your pharmacist or local waste disposal company for more details about how to safely discard your product.    Information last revised December 2010 Copyright(c) 2010 [employer].

## 2011-02-12 LAB — TSH: TSH: 2.454 u[IU]/mL (ref 0.350–4.500)

## 2011-02-12 NOTE — Progress Notes (Signed)
  Subjective:    Patient ID: Rachel Bridges, female    DOB: Sep 25, 1924, 77 y.o.   MRN: [id number]  HPI Rachel Bridges is 77 yo women who is a long term patient of mine. She is here today for a regular follow up accompanied by her daughter.  Her chief complaint today is that she is having pain in her left year and suspects that there is wax impacted in the ears.  She has been unsteady on her feet since last few months and I remember seeing her walking without any support in our clinic earlier this month.  She has been loosing weight steadily over last few years and looking back has lost about 20 pounds in last 3 years. She is down by about 2 pounds since last seen in the clinic. Patient complains decreased appetite. Most recent bone scan and tests for cancer recurrence did not reveal any red flags.  Flu shot today.   Review of Systems  Constitutional: Positive for appetite change. Negative for fever and activity change.  HENT: Negative for sore throat.   Respiratory: Negative for cough and shortness of breath.   Cardiovascular: Negative for chest pain and leg swelling.  Gastrointestinal: Negative for nausea, abdominal pain, diarrhea, constipation and abdominal distention.  Genitourinary: Negative for frequency, hematuria and difficulty urinating.  Musculoskeletal: Positive for gait problem.  Neurological: Positive for weakness. Negative for dizziness and headaches.  Psychiatric/Behavioral: Negative for suicidal ideas and behavioral problems.       Objective:   Physical Exam  Constitutional: She is oriented to person, place, and time. She appears well-nourished.  HENT:  Head: Normocephalic and atraumatic.       Right and left external canal wax impaction noted.  Eyes: Conjunctivae and EOM are normal. Pupils are equal, round, and reactive to light. No scleral icterus.  Neck: Normal range of motion. Neck supple. No JVD present. No thyromegaly present.  Cardiovascular: Normal rate, regular  rhythm, normal heart sounds and intact distal pulses.  Exam reveals no gallop and no friction rub.   No murmur heard. Pulmonary/Chest: Effort normal and breath sounds normal. No respiratory distress. She has no wheezes. She has no rales.  Abdominal: Soft. Bowel sounds are normal. She exhibits distension. She exhibits no mass. There is no tenderness. There is no rebound and no guarding.  Musculoskeletal: Normal range of motion. She exhibits no edema and no tenderness.  Lymphadenopathy:    She has no cervical adenopathy.  Neurological: She is alert and oriented to person, place, and time.  Skin: Skin is dry.  Psychiatric: She has a normal mood and affect. Her behavior is normal.          Assessment & Plan:

## 2011-02-13 DIAGNOSIS — R627 Adult failure to thrive: Secondary | ICD-10-CM | POA: Insufficient documentation

## 2011-02-13 NOTE — Assessment & Plan Note (Signed)
Please see above note in anxiety section.

## 2011-02-13 NOTE — Assessment & Plan Note (Signed)
We discussed about increasing the dose of sertraline but decided against it as patient had been on higher dose in the past and made her really sleepy. I discussed about adding mirtazepine to her current regimen and will add it at next office visit. The case was discussed with Dr Coralee Pesa in detail.

## 2011-02-13 NOTE — Assessment & Plan Note (Signed)
Add vitamin D and calcium to her daily meds as it has been shown to decrease the number of falls in elderly.

## 2011-02-14 ENCOUNTER — Other Ambulatory Visit: Payer: Self-pay | Admitting: Internal Medicine

## 2011-02-21 ENCOUNTER — Ambulatory Visit (INDEPENDENT_AMBULATORY_CARE_PROVIDER_SITE_OTHER): Payer: Medicare Other | Admitting: Internal Medicine

## 2011-02-21 ENCOUNTER — Encounter: Payer: Self-pay | Admitting: Internal Medicine

## 2011-02-21 VITALS — BP 100/70 | HR 86 | Temp 97.5°F | Ht 69.0 in | Wt 129.4 lb

## 2011-02-21 DIAGNOSIS — R739 Hyperglycemia, unspecified: Secondary | ICD-10-CM

## 2011-02-21 DIAGNOSIS — R350 Frequency of micturition: Secondary | ICD-10-CM

## 2011-02-21 DIAGNOSIS — R7309 Other abnormal glucose: Secondary | ICD-10-CM

## 2011-02-21 DIAGNOSIS — J189 Pneumonia, unspecified organism: Secondary | ICD-10-CM

## 2011-02-21 LAB — GLUCOSE, CAPILLARY: Glucose-Capillary: 104 mg/dL — ABNORMAL HIGH (ref 70–99)

## 2011-02-21 MED ORDER — CLARITHROMYCIN 250 MG PO TABS: 250.0000 mg | ORAL_TABLET | Freq: Two times a day (BID) | ORAL | Status: DC

## 2011-02-21 NOTE — Progress Notes (Signed)
HPI: 1. Patient reprots "not feeling well." Sx started 6 days ago and the day after obtaining a flu shot. Reports chills, productive cought with moderate amount of yellow thick phlegm;and poor appetitive;  denies any other Sx.  Past Medical History  Diagnosis Date  . MGUS (monoclonal gammopathy of unknown significance)      [facility]  . Anxiety   . History of colon cancer      History of bowel  obstruction. on treatment with Colace and MiraLax.  followed by Dr. Loreta Ave and Dr. Purnell Shoemaker  . Depression   . Diverticulosis of colon   . Osteopenia   . Cellulitis      left elbow  . Lumbar spinal stenosis   . Atrophic vaginitis   . Renal cyst   . History of tobacco abuse   . Dermatitis   . Hip pain      History  . Actinic keratosis   . Dementia    Current Outpatient Prescriptions  Medication Sig Dispense Refill  . aspirin 81 MG tablet Take 81 mg by mouth daily.        . calcium citrate-vitamin D (CITRACAL+D) 315-200 MG-UNIT per tablet Take 1 tablet by mouth 2 (two) times daily.  100 tablet  3  . carbamide peroxide (EAR WAX REMOVAL DROPS) 6.5 % otic solution Place 5 drops into the left ear 2 (two) times daily.  15 mL  0  . cyanocobalamin (,VITAMIN B-12,) 1000 MCG/ML injection Inject 1,000 mcg into the muscle every 30 (thirty) days.        . megestrol (MEGACE) 400 MG/10ML suspension Take 10 mLs (400 mg total) by mouth 2 (two) times daily.  480 mL  3  . metoCLOPramide (REGLAN) 5 MG tablet Take 1 tablet (5 mg total) by mouth 4 (four) times daily -  before meals and at bedtime.  30 tablet  0  . sertraline (ZOLOFT) 25 MG tablet TAKE 1 TABLET BY MOUTH DAILY  90 tablet  3  . clarithromycin (BIAXIN) 250 MG tablet Take 1 tablet (250 mg total) by mouth 2 (two) times daily.  20 tablet  0   No family history on file. History   Social History  . Marital Status: Widowed    Spouse Name: N/A    Number of Children: N/A  . Years of Education: N/A   Social  History Main Topics  . Smoking status: Former Smoker    Quit date: 08/01/1988  . Smokeless tobacco: None  . Alcohol Use: No  . Drug Use: No  . Sexually Active: None   Other Topics Concern  . None   Social History Narrative    Husband died of myeloma. Patient is to daughter's one of whom she lives with her.    Review of Systems: Constitutional: Denies fever, chills, diaphoresis, appetite change and fatigue.  HEENT: Denies photophobia, eye pain, redness, hearing loss, ear pain, congestion, sore throat, rhinorrhea, sneezing, mouth sores, trouble swallowing, neck pain, neck stiffness and tinnitus.  Respiratory: Denies SOB, DOE, endorses cough, denies chest tightness, and wheezing.  Cardiovascular: Denies chest pain, palpitations and leg swelling.  Gastrointestinal: Denies nausea, vomiting, abdominal pain, diarrhea, constipation, blood in stool and abdominal distention.  Genitourinary: Denies dysuria, urgency, frequency, hematuria, flank pain and difficulty urinating.  Musculoskeletal: Denies myalgias, back pain, joint swelling, arthralgias and gait problem.  Skin: Denies pallor, rash and wound.  Neurological: Denies dizziness, seizures, syncope, weakness, light-headedness, numbness and headaches.  Hematological: Denies adenopathy.  Easy bruising, personal or family bleeding history  Psychiatric/Behavioral: Denies suicidal ideation, mood changes, confusion, nervousness, sleep disturbance and agitation   Vitals: reviewed General: alert, cachectic, and cooperative to examination.  Head: normocephalic and atraumatic.  Eyes: vision grossly intact, pupils equal, pupils round, pupils reactive to light, no injection and anicteric.  Mouth: pharynx pink and moist, no erythema, and no exudates.  Neck: supple, full ROM, no thyromegaly, no JVD, and no carotid bruits.  Lungs: normal respiratory effort, no accessory muscle use, small bibasilar crackles; egophony noted; Heart: normal rate, regular  rhythm, no murmur, no gallop, and no rub.  Abdomen: soft, non-tender, normal bowel sounds, no distention, no guarding, no rebound tenderness, no hepatomegaly, and no splenomegaly.  Msk: no joint swelling, no joint warmth, and no redness over joints.  Pulses: 2+ DP/PT pulses bilaterally Extremities: No cyanosis, clubbing, edema Neurologic: alert & oriented X3, cranial nerves II-XII intact, strength normal in all extremities, sensation intact to light touch, and gait normal.  Skin: turgor normal and no rashes.  Psych: Oriented X3, memory intact for recent and remote, normally interactive, good eye contact, not anxious appearing, and not depressed appearing.    Assessment & Plan:  1. Bronchitis vs CAP -patient refused CXR -Empiric Tx with Clarithromycin -Hydration, nutrition  2. Mild renal insufficiency with electrolyte derrangement -likely 2/2  Poor oral intake -Nutrition and fluid intake discussed -repeat Bmet today  3. Polyuria with a PMHX of ?DM -last HgbA1C of 4.7 in 2011 -Will recheck Bmet and HgbA1C -hydration RTC in 1 week.

## 2011-02-21 NOTE — Progress Notes (Deleted)
  Subjective:    Patient ID: Rachel Bridges, female    DOB: [birth year], 77 y.o.   MRN: [id number]  HPI    Review of Systems     Objective:   Physical Exam        Assessment & Plan:

## 2011-02-21 NOTE — Patient Instructions (Signed)
Please, drink plenty of fluids Take antibiotics one tablet by mouth twice a day with meals for 10 days. Please, follow up with [place] if not feeling better or worse.

## 2011-02-22 LAB — BASIC METABOLIC PANEL
BUN: 18 mg/dL (ref 6–23)
CO2: 16 mEq/L — ABNORMAL LOW (ref 19–32)
Calcium: 7.9 mg/dL — ABNORMAL LOW (ref 8.4–10.5)
Glucose, Bld: 105 mg/dL — ABNORMAL HIGH (ref 70–99)

## 2011-02-28 ENCOUNTER — Encounter: Payer: Medicare Other | Admitting: Internal Medicine

## 2011-03-01 ENCOUNTER — Encounter: Payer: Self-pay | Admitting: Internal Medicine

## 2011-03-01 ENCOUNTER — Inpatient Hospital Stay (HOSPITAL_COMMUNITY)
Admission: RE | Admit: 2011-03-01 | Discharge: 2011-03-09 | DRG: 683 | Disposition: A | Discharge status: Home Health | Payer: Medicare Other | Source: Ambulatory Visit | Attending: Internal Medicine | Admitting: Internal Medicine

## 2011-03-01 ENCOUNTER — Ambulatory Visit (INDEPENDENT_AMBULATORY_CARE_PROVIDER_SITE_OTHER): Payer: Medicare Other | Admitting: Internal Medicine

## 2011-03-01 ENCOUNTER — Other Ambulatory Visit: Payer: Self-pay

## 2011-03-01 VITALS — BP 107/71 | HR 87 | Temp 96.0°F | Wt 124.2 lb

## 2011-03-01 DIAGNOSIS — I129 Hypertensive chronic kidney disease with stage 1 through stage 4 chronic kidney disease, or unspecified chronic kidney disease: Secondary | ICD-10-CM | POA: Diagnosis present

## 2011-03-01 DIAGNOSIS — R1310 Dysphagia, unspecified: Secondary | ICD-10-CM | POA: Diagnosis present

## 2011-03-01 DIAGNOSIS — N039 Chronic nephritic syndrome with unspecified morphologic changes: Secondary | ICD-10-CM | POA: Diagnosis present

## 2011-03-01 DIAGNOSIS — D649 Anemia, unspecified: Secondary | ICD-10-CM | POA: Diagnosis present

## 2011-03-01 DIAGNOSIS — N183 Chronic kidney disease, stage 3 unspecified: Secondary | ICD-10-CM | POA: Diagnosis present

## 2011-03-01 DIAGNOSIS — E2749 Other adrenocortical insufficiency: Secondary | ICD-10-CM | POA: Diagnosis present

## 2011-03-01 DIAGNOSIS — E872 Acidosis, unspecified: Secondary | ICD-10-CM | POA: Diagnosis present

## 2011-03-01 DIAGNOSIS — L219 Seborrheic dermatitis, unspecified: Secondary | ICD-10-CM | POA: Diagnosis present

## 2011-03-01 DIAGNOSIS — E785 Hyperlipidemia, unspecified: Secondary | ICD-10-CM | POA: Diagnosis present

## 2011-03-01 DIAGNOSIS — K5939 Other megacolon: Secondary | ICD-10-CM | POA: Diagnosis present

## 2011-03-01 DIAGNOSIS — D631 Anemia in chronic kidney disease: Secondary | ICD-10-CM | POA: Diagnosis present

## 2011-03-01 DIAGNOSIS — K219 Gastro-esophageal reflux disease without esophagitis: Secondary | ICD-10-CM | POA: Diagnosis present

## 2011-03-01 DIAGNOSIS — K900 Celiac disease: Secondary | ICD-10-CM | POA: Diagnosis present

## 2011-03-01 DIAGNOSIS — Z85038 Personal history of other malignant neoplasm of large intestine: Secondary | ICD-10-CM

## 2011-03-01 DIAGNOSIS — R627 Adult failure to thrive: Secondary | ICD-10-CM | POA: Diagnosis present

## 2011-03-01 DIAGNOSIS — N179 Acute kidney failure, unspecified: Secondary | ICD-10-CM

## 2011-03-01 DIAGNOSIS — R0602 Shortness of breath: Secondary | ICD-10-CM

## 2011-03-01 DIAGNOSIS — J189 Pneumonia, unspecified organism: Secondary | ICD-10-CM

## 2011-03-01 DIAGNOSIS — E2740 Unspecified adrenocortical insufficiency: Secondary | ICD-10-CM | POA: Diagnosis present

## 2011-03-01 DIAGNOSIS — R05 Cough: Secondary | ICD-10-CM

## 2011-03-01 DIAGNOSIS — D472 Monoclonal gammopathy: Secondary | ICD-10-CM | POA: Diagnosis present

## 2011-03-01 DIAGNOSIS — F3289 Other specified depressive episodes: Secondary | ICD-10-CM | POA: Diagnosis present

## 2011-03-01 DIAGNOSIS — R197 Diarrhea, unspecified: Secondary | ICD-10-CM | POA: Diagnosis present

## 2011-03-01 DIAGNOSIS — F411 Generalized anxiety disorder: Secondary | ICD-10-CM | POA: Diagnosis present

## 2011-03-01 DIAGNOSIS — F329 Major depressive disorder, single episode, unspecified: Secondary | ICD-10-CM | POA: Diagnosis present

## 2011-03-01 DIAGNOSIS — E538 Deficiency of other specified B group vitamins: Secondary | ICD-10-CM | POA: Diagnosis present

## 2011-03-01 DIAGNOSIS — L408 Other psoriasis: Secondary | ICD-10-CM | POA: Diagnosis present

## 2011-03-01 LAB — COMPLETE METABOLIC PANEL WITH GFR
ALT: 9 U/L (ref 0–35)
AST: 11 U/L (ref 0–37)
BUN: 26 mg/dL — ABNORMAL HIGH (ref 6–23)
Creat: 1.4 mg/dL — ABNORMAL HIGH (ref 0.50–1.10)
GFR, Est African American: 40 mL/min — ABNORMAL LOW (ref >89)
Total Bilirubin: 0.2 mg/dL — ABNORMAL LOW (ref 0.3–1.2)

## 2011-03-01 LAB — EXPECTORATED SPUTUM ASSESSMENT W GRAM STAIN, RFLX TO RESP C

## 2011-03-01 LAB — CBC WITH DIFFERENTIAL/PLATELET
Basophils Relative: 0 % (ref 0–1)
Eosinophils Absolute: 0.1 10*3/uL (ref 0.0–0.7)
Hemoglobin: 10.9 g/dL — ABNORMAL LOW (ref 12.0–15.0)
Lymphocytes Relative: 22 % (ref 12–46)
Lymphs Abs: 1.6 10*3/uL (ref 0.7–4.0)
MCH: 26.7 pg (ref 26.0–34.0)
MCHC: 32.5 g/dL (ref 30.0–36.0)
MCV: 81.9 fL (ref 78.0–100.0)
Monocytes Relative: 6 % (ref 3–12)
Neutrophils Relative %: 71 % (ref 43–77)
Platelets: 290 10*3/uL (ref 150–400)
RBC: 4.09 MIL/uL (ref 3.87–5.11)
WBC: 7.3 10*3/uL (ref 4.0–10.5)

## 2011-03-01 NOTE — Progress Notes (Signed)
  Subjective:    Patient ID: Rachel Bridges, female    DOB: 04/17/1925, 77 y.o.   MRN: [id number]  HPI  Patient is 77 year old female a patient of mine who is here today with complaints of cough with sputum. Patient was seen a week ago by Dr. Denton Meek and was started on antibiotics for similar complaints but patient does not seem to have improved.  Patient complains of decreased appetite, continued cough, sputum which is clear in color, shortness of breath, patient's daughter describes that she is easily "short-winded".  No fever or chills noted at this time.  Review of Systems  Constitutional: Positive for activity change, appetite change, fatigue and unexpected weight change. Negative for fever.  HENT: Positive for congestion, rhinorrhea and sneezing. Negative for sore throat.   Eyes: Positive for discharge.  Respiratory: Positive for cough and shortness of breath.   Cardiovascular: Negative for chest pain and leg swelling.  Gastrointestinal: Negative for nausea, abdominal pain, diarrhea, constipation and abdominal distention.  Genitourinary: Negative for frequency, hematuria and difficulty urinating.  Neurological: Positive for weakness. Negative for dizziness and headaches.  Psychiatric/Behavioral: Negative for suicidal ideas and behavioral problems.       Objective:   Physical Exam  Constitutional: She is oriented to person, place, and time. She appears listless. She appears cachectic. She has a sickly appearance. She appears ill. She appears distressed.  HENT:  Head: Normocephalic and atraumatic.  Eyes: Conjunctivae and EOM are normal. Pupils are equal, round, and reactive to light. Right eye exhibits discharge. No scleral icterus.  Neck: Normal range of motion. Neck supple. No JVD present. No thyromegaly present.  Cardiovascular: Normal rate, regular rhythm, normal heart sounds and intact distal pulses.  Exam reveals no gallop and no friction rub.   No murmur  heard. Pulmonary/Chest: Effort normal and breath sounds normal. No respiratory distress. She has no wheezes. She has no rales.  Abdominal: Soft. Bowel sounds are normal. She exhibits no distension and no mass. There is no tenderness. There is no rebound and no guarding.  Musculoskeletal: Normal range of motion. She exhibits no edema and no tenderness.  Lymphadenopathy:    She has no cervical adenopathy.  Neurological: She is oriented to person, place, and time. She appears listless.  Skin: Skin is dry.  Psychiatric: She has a normal mood and affect. Her behavior is normal.          Assessment & Plan:

## 2011-03-01 NOTE — H&P (Signed)
Hospital Admission Note Date: 03/01/2011  Patient name: Rachel Bridges Medical record number: [id number] Date of birth: Dec 07, 1924 Age: 77 y.o. Gender: female PCP: Lars Mage, MD  Medical Service: Internal medicine teaching service - Maurice March  Attending physician:  Dr. Meredith Pel    1st Contact: Dr. Abner Greenspan    Pager: [phone] 2nd Contact: Dr. Tonny Branch               Pager: [phone]  After 5 pm or weekends: 1st Contact:   Pager: [phone] 2nd Contact:  Pager: [phone]  Chief Complaint: Fatigue  History of Present Illness:  The patient is an 77 year old female  who presents as an admission from clinic.   She was initially seen on October 25 for complaints of productive cough, fatigue, malaise, and poor appetite. At that time she was prescribed a 10 day course of clarithromycin for empiric treatment of bacterial bronchitis/community-acquired pneumonia.  She notes her symptoms have persisted despite antibiotic therapy and she continues to experience cough and poor appetite.  She admits to nausea but denies vomiting,  diarrhea, or abdominal pain.  She denies fevers, sweats, or chills.  She admits to contact with her 59 year old nephew has recently been sick with "a cold."   She also admits to general fatigue and malaise.  She denies dysuria or other urinary symptoms. Additionally she notes shortness of breath but states this is relatively unchanged from her baseline. She denies chest pain, syncope, or other complaint.  Meds: Current Outpatient Prescriptions  Medication Sig Dispense Refill  . aspirin 81 MG tablet Take 81 mg by mouth daily.        . calcium citrate-vitamin D (CITRACAL+D) 315-200 MG-UNIT per tablet Take 1 tablet by mouth 2 (two) times daily.  100 tablet  3  . clarithromycin (BIAXIN) 250 MG tablet Take 1 tablet (250 mg total) by mouth 2 (two) times daily.  20 tablet  0  . cyanocobalamin (,VITAMIN B-12,) 1000 MCG/ML injection Inject 1,000 mcg into the muscle every 30 (thirty) days.         . megestrol (MEGACE) 400 MG/10ML suspension Take 10 mLs (400 mg total) by mouth 2 (two) times daily.  480 mL  3  . metoCLOPramide (REGLAN) 5 MG tablet Take 1 tablet (5 mg total) by mouth 4 (four) times daily -  before meals and at bedtime.  30 tablet  0  . sertraline (ZOLOFT) 25 MG tablet TAKE 1 TABLET BY MOUTH DAILY  90 tablet  3    Allergies: NKDA  Past Medical History  Diagnosis Date  . MGUS (monoclonal gammopathy of unknown significance)      [facility]  . Anxiety   . History of colon cancer      History of bowel  obstruction. on treatment with Colace and MiraLax.  followed by Dr. Loreta Ave and Dr. Purnell Shoemaker  . Depression   . Diverticulosis of colon   . Osteopenia   . Cellulitis      left elbow  . Lumbar spinal stenosis   . Atrophic vaginitis   . Renal cyst   . History of tobacco abuse   . Dermatitis   . Hip pain      History  . Actinic keratosis   . Dementia    Past Surgical History  Procedure Date  . Hemicolectomy      Right-sided  .  resection of ovarian cyst   . Cataract extraction      Left eye  . Knee arthroscopy  Left knee  . Appendectomy   . Carpal tunnel release   . Lumbar laminectomy   . Lumbar fusion    Family history: Noncontributory  Social history: Patient is widowed. She lives and home with her daughter.  She is a former smoker.  She quit approximately 22 years ago previously smoked 2 packs per day for 50 yrs.  she denies alcohol or illicit drug use   Review of Systems: Pertinent items are noted in HPI.  Physical Exam: Vital signs: T.: 96, HR: 87, BP: 107/71, RR: 12 VItal signs reviewed and stable. GEN: No apparent distress.  Alert and oriented x 3.  Pleasant, conversant, and cooperative to exam. HEENT: head is autraumatic and normocephalic.  Neck is supple without palpable masses or lymphadenopathy.  No JVD  Vision intact.  EOMI.  PERRLA.  Sclerae anicteric.  Conjunctivae withoutnjection. Mucous  membranes are somewhat dry.  Oropharynx is without erythema, exudates, or other abnormal lesions.   RESP:  Lungs are clear to ascultation bilaterally with good air movement.  No wheezes, ronchi, or rubs. CARDIOVASCULAR: regular rate, normal rhythm.  Distant but clear S1, S2, no murmurs, gallops, or rubs. ABDOMEN: soft, non-tender, non-distended.  Bowels sounds present in all quadrants and normoactive.  No palpable masses. EXT: warm and dry.  Peripheral pulses equal, intact, and + diminished globally  No clubbing or cyanosis.  No edema in bilateral lower extremities. SKIN: warm and dry with normal turgor.  No rashes or abnormal lesions observed.  Decreased skin turgor noted NEURO: CN II-XII grossly intact.  Muscle strength +5/5 in bilateral upper and lower extremities.  Sensation is grossly intact.  No focal deficit.  Lab results: Basic Metabolic Panel:  Basename 03/01/11 1433  NA 141  K 3.4*  CL 114*  CO2 16*  GLUCOSE 111*  BUN 26*  CREATININE 1.40*  CALCIUM 8.8  MG --  PHOS --   Liver Function Tests:  [facility] 03/01/11 1433  AST 11  ALT 9  ALKPHOS 126*  BILITOT 0.2*  PROT 7.1  ALBUMIN 2.6*   CBC:  Basename 03/01/11 1433  WBC 7.3  NEUTROABS 5.1  HGB 10.9*  HCT 33.5*  MCV 81.9  PLT 290   Imaging results:  Dg Chest 2 View  03/01/2011  *RADIOLOGY REPORT*  Clinical Data: Pneumonia, cough with sputum production  CHEST - 2 VIEW  Comparison: 06/13/2007  Findings: Normal heart size, mediastinal contours, and pulmonary vascularity. Lungs appear emphysematous but clear. No pleural effusion or pneumothorax. Mild elevation of right diaphragm with interposition of colon between liver and diaphragm. Osseous demineralization diffusely.  IMPRESSION: Emphysematous changes. No acute abnormalities.  Original Report Authenticated By: Lollie Marrow, M.D.    Assessment & Plan by Problem:   #ARF: Patient and creatinine is elevated above her baseline at 1.4 (1.1-1.2). This is likely  prerenal and the results of reduced oral intake. Will hydrate with IV fluids overnight and repeat the bmet. If her creatinine remains elevated will check urine sodium urine creatinine and consider renal ultrasound for further evaluation. - IVF - Repeat BMET in am - Will obtain U Na and U Cr tomorrow if renal function is not improved  #Fatigue/malaise:  Patient has a long-standing history of chronic fatigue and depression.  The symptoms may be exacerbated by her recent upper respiratory illness and poor PO intake.  There is no objective evidence for pneumonia or other acute process.  Will continue to monitor her closely. - IVF - Will check TSH - Continue Zoloft -  PT/OT consult - Continue megace  #Cough:  Patient's persistent cough may represent a natural course of her recent bronchitis. She is afebrile, without leukocytosis, and there is no evidence of evolving pneumonia or other concerning process on chest x-ray. Is also possible her cough may represent poorly controlled GERD; she was prescribed Protonix on multiple hospital discharges however this is not on her current medication list.  There are no indications for empiric antibiotic treatment at this time. We'll continue to monitor her closely for evolving infectious process. - Sputum Gram stain culture - Blood cx x 2 - Repeat PA and lateral chest x-ray in the morning - Cheratussin cough syrup for symptomatic relief - Protonix for treatment of GERD   # Hypokalemia: A slightly low at 3.4. Patient has a history of malnutrition and hypomagnesemia. Will replete with oral potassium supplementation and check a magnesium level. Will repeat Mag if indicated.  - KCl PO x 1 now - BMET in AM - Check Mag level  # Anemia, normocytic: Patient's hemoglobin is currently at baseline.  WIll continue to monitor.  #DVT ppx: lovenox   R3______________________________      R1________________________________  ATTENDING: I performed and/or  observed a history and physical examination of the patient.  I discussed the case with the residents as noted and reviewed the residents' notes.  I agree with the findings and plan--please refer to the attending physician note for more details.  Signature________________________________  Printed Name_____________________________

## 2011-03-02 ENCOUNTER — Inpatient Hospital Stay (HOSPITAL_COMMUNITY): Payer: Medicare Other

## 2011-03-02 DIAGNOSIS — R627 Adult failure to thrive: Secondary | ICD-10-CM

## 2011-03-02 DIAGNOSIS — R5381 Other malaise: Secondary | ICD-10-CM

## 2011-03-02 DIAGNOSIS — N179 Acute kidney failure, unspecified: Secondary | ICD-10-CM

## 2011-03-02 DIAGNOSIS — R5383 Other fatigue: Secondary | ICD-10-CM

## 2011-03-02 DIAGNOSIS — D649 Anemia, unspecified: Secondary | ICD-10-CM

## 2011-03-02 LAB — EXPECTORATED SPUTUM ASSESSMENT W GRAM STAIN, RFLX TO RESP C

## 2011-03-02 LAB — CBC
Hemoglobin: 9.6 g/dL — ABNORMAL LOW (ref 12.0–15.0)
MCH: 26 pg (ref 26.0–34.0)
RBC: 3.69 MIL/uL — ABNORMAL LOW (ref 3.87–5.11)

## 2011-03-02 LAB — BASIC METABOLIC PANEL
CO2: 16 mEq/L — ABNORMAL LOW (ref 19–32)
Glucose, Bld: 98 mg/dL (ref 70–99)
Potassium: 3.8 mEq/L (ref 3.5–5.1)
Sodium: 142 mEq/L (ref 135–145)

## 2011-03-02 LAB — TSH: TSH: 2.908 u[IU]/mL (ref 0.350–4.500)

## 2011-03-02 MED ORDER — ONDANSETRON HCL 4 MG/2ML IJ SOLN: 4.0000 mg | Freq: Four times a day (QID) | INTRAMUSCULAR | Status: DC | PRN

## 2011-03-02 MED ORDER — ENOXAPARIN SODIUM 30 MG/0.3ML ~~LOC~~ SOLN
30.0000 mg | SUBCUTANEOUS | Status: DC
  Administered 2011-03-03: 30 mg via SUBCUTANEOUS
  Filled 2011-03-02 (×3): qty 0.3

## 2011-03-02 MED ORDER — ASPIRIN EC 81 MG PO TBEC
81.0000 mg | DELAYED_RELEASE_TABLET | Freq: Every day | ORAL | Status: DC
  Administered 2011-03-03 – 2011-03-08 (×6): 81 mg via ORAL
  Filled 2011-03-02 (×7): qty 1

## 2011-03-02 MED ORDER — SERTRALINE HCL 25 MG PO TABS
25.0000 mg | ORAL_TABLET | Freq: Every day | ORAL | Status: DC
  Administered 2011-03-03 – 2011-03-08 (×5): 25 mg via ORAL
  Filled 2011-03-02 (×10): qty 1

## 2011-03-02 MED ORDER — MEGESTROL ACETATE 40 MG/ML PO SUSP
800.0000 mg | Freq: Every day | ORAL | Status: DC
  Administered 2011-03-03 – 2011-03-06 (×4): 800 mg via ORAL
  Filled 2011-03-02 (×4): qty 20

## 2011-03-02 MED ORDER — SODIUM CHLORIDE 0.9 % IV SOLN
INTRAVENOUS | Status: DC
  Administered 2011-03-03 – 2011-03-04 (×3): via INTRAVENOUS

## 2011-03-02 MED ORDER — GUAIFENESIN-CODEINE 100-10 MG/5ML PO SOLN: 5.0000 mL | ORAL | Status: DC | PRN

## 2011-03-03 DIAGNOSIS — D649 Anemia, unspecified: Secondary | ICD-10-CM | POA: Diagnosis present

## 2011-03-03 DIAGNOSIS — R05 Cough: Secondary | ICD-10-CM

## 2011-03-03 LAB — BASIC METABOLIC PANEL
CO2: 15 mEq/L — ABNORMAL LOW (ref 19–32)
Glucose, Bld: 98 mg/dL (ref 70–99)
Potassium: 3.7 mEq/L (ref 3.5–5.1)
Sodium: 142 mEq/L (ref 135–145)

## 2011-03-03 LAB — CBC
Hemoglobin: 9.4 g/dL — ABNORMAL LOW (ref 12.0–15.0)
MCH: 26.6 pg (ref 26.0–34.0)
MCV: 81.6 fL (ref 78.0–100.0)
RBC: 3.54 MIL/uL — ABNORMAL LOW (ref 3.87–5.11)

## 2011-03-03 LAB — VITAMIN B12: Vitamin B-12: 190 pg/mL — ABNORMAL LOW (ref 211–911)

## 2011-03-03 LAB — RETICULOCYTES: Retic Ct Pct: 1.1 % (ref 0.4–3.1)

## 2011-03-03 LAB — IRON AND TIBC
Saturation Ratios: 9 % — ABNORMAL LOW (ref 20–55)
TIBC: 236 ug/dL — ABNORMAL LOW (ref 250–470)

## 2011-03-03 LAB — FERRITIN: Ferritin: 67 ng/mL (ref 10–291)

## 2011-03-03 MED ORDER — FAMOTIDINE 40 MG PO TABS
40.0000 mg | ORAL_TABLET | Freq: Two times a day (BID) | ORAL | Status: DC
  Administered 2011-03-03 – 2011-03-06 (×6): 40 mg via ORAL
  Filled 2011-03-03 (×10): qty 1

## 2011-03-03 NOTE — Progress Notes (Signed)
Occupational Therapy Evaluation Patient Details Name: Rachel Bridges MRN: [id number] DOB: 05-Mar-1925 Today's Date: 03/03/2011  Problem List:  Patient Active Problem List  Diagnoses  . VITAMIN B12 DEFICIENCY  . HYPERLIPIDEMIA  . MONOCLONAL GAMMOPATHY  . ANXIETY  . DEPRESSION  . GERD  . CELIAC DISEASE  . DERMATITIS, SEBORRHEIC  . PSORIASIS  . GAIT IMBALANCE  . Abdominal pain  . Adult failure to thrive  . Shortness of breath  . Cough  . Normocytic anemia  . Acute on chronic renal failure  . Fatigue  . Diarrhea    Past Medical History:  Past Medical History  Diagnosis Date  . MGUS (monoclonal gammopathy of unknown significance)      [facility]  . Anxiety   . History of colon cancer      History of bowel  obstruction. on treatment with Colace and MiraLax.  followed by Dr. Loreta Ave and Dr. Purnell Shoemaker  . Depression   . Diverticulosis of colon   . Osteopenia   . Cellulitis      left elbow  . Lumbar spinal stenosis   . Atrophic vaginitis   . Renal cyst   . History of tobacco abuse   . Dermatitis   . Hip pain      History  . Actinic keratosis   . Dementia    Past Surgical History:  Past Surgical History  Procedure Date  . Hemicolectomy      Right-sided  .  resection of ovarian cyst   . Cataract extraction      Left eye  . Knee arthroscopy      Left knee  . Appendectomy   . Carpal tunnel release   . Lumbar laminectomy   . Lumbar fusion     OT Assessment/Plan/Recommendation OT Assessment Clinical Impression Statement: pt with decrease balance, cognition deficits, poor safety awareness OT Recommendation/Assessment: Patient will need skilled OT in the acute care venue OT Problem List: Decreased activity tolerance;Impaired balance (sitting and/or standing);Decreased cognition;Decreased safety awareness OT Therapy Diagnosis : Generalized weakness;Cognitive deficits OT Plan OT Frequency: Min 2X/week OT  Treatment/Interventions: Self-care/ADL training;DME and/or AE instruction;Therapeutic activities;Cognitive remediation/compensation;Patient/family education;Balance training OT Recommendation Follow Up Recommendations: Home health OT Equipment Recommended: None recommended by OT Individuals Consulted Consulted and Agree with Results and Recommendations: Family member/caregiver;Patient OT Goals Acute Rehab OT Goals OT Goal Formulation: With patient/family Time For Goal Achievement: 2 weeks ADL Goals Pt Will Perform Upper Body Bathing: with supervision;Sitting, chair Pt Will Perform Lower Body Bathing: with supervision;Sitting, chair Pt Will Perform Upper Body Dressing: with supervision;Sitting, chair Pt Will Perform Lower Body Dressing: with supervision;Sitting, chair Pt Will Transfer to Toilet: with min assist;3-in-1;Ambulation;Squat pivot transfer  OT Evaluation Precautions/Restrictions  Precautions Precautions: Fall Restrictions Weight Bearing Restrictions: No Prior Functioning Home Living Lives With: Family;Daughter Receives Help From: Family Type of Home: House Home Layout: One level Home Access: Stairs to enter Entrance Stairs-Rails:  (1) Entrance Stairs-Number of Steps: ` Bathroom Shower/Tub: Tub/shower unit;Curtain (hand held and grab bars all the way around tub) Bathroom Toilet: Standard Bathroom Accessibility: Yes How Accessible: Accessible via walker Home Adaptive Equipment: Bedside commode/3-in-1;Grab bars in shower;Hand-held shower hose;Shower chair with back;Walker - rolling;Wheelchair - manual Prior Function Level of Independence: Independent with basic ADLs;Independent with gait;Independent with transfers Able to Take Stairs?: Yes Driving: No Vocation: Retired ADL ADL Eating/Feeding: Performed;Supervision/safety Where Assessed - Eating/Feeding: Bed level Grooming: Not assessed Upper Body Bathing: Not assessed Lower Body Bathing: Not assessed Upper Body  Dressing: Performed;Minimal assistance Where Assessed - Upper Body Dressing: Sitting, bed Lower Body Dressing: Maximal assistance Where Assessed - Lower Body Dressing: Sitting, bed Toilet Transfer: Simulated;Moderate assistance Toilet Transfer Method: Stand pivot Toilet Transfer Equipment: Raised toilet seat with arms (or 3-in-1 over toilet) Toileting - Clothing Manipulation: Not assessed Where Assessed - Toileting Clothing Manipulation: Not assessed Toileting - Hygiene: Not assessed Tub/Shower Transfer: Not assessed ADL Comments: pt reluctant to participate in therapy and family strongly encouraging Vision/Perception  Vision - History Baseline Vision: No visual deficits Cognition Cognition Arousal/Alertness: Awake/alert Overall Cognitive Status: Impaired Attention: Impaired Current Attention Level: Alternating Memory: Appears impaired Memory Deficits: pt states I have a good memory sometimes Orientation Level: Oriented to person;Oriented to place Following Commands: Follows one step commands inconsistently Safety/Judgement: Decreased safety judgement for tasks assessed Decreased Safety/Judgement: Decreased awareness of need for assistance Awareness of Errors: Decreased awareness of errors made Decreased Awareness of Errors: Assistance required to identify errors made;Assistance required to correct errors made Awareness of Deficits: Decreased awareness of deficits Problem Solving: Requires assistance for problem solving Sensation/Coordination Coordination Gross Motor Movements are Fluid and Coordinated: Yes Fine Motor Movements are Fluid and Coordinated: Not tested Extremity Assessment RUE Assessment RUE Assessment: Within Functional Limits LUE Assessment LUE Assessment: Within Functional Limits Mobility  Bed Mobility Bed Mobility: Yes Rolling Right: 4: Min assist Right Sidelying to Sit: 3: Mod assist;With rails;HOB elevated (comment degrees) (25) Sitting - Scoot to Edge  of Bed: 4: Min assist;With rail Transfers Transfers: Yes Sit to Stand: 3: Mod assist;With upper extremity assist;From bed Exercises   End of Session OT - End of Session Equipment Utilized During Treatment: Gait belt Activity Tolerance: Patient tolerated treatment well Patient left: in chair;with call bell in reach;with family/visitor present Nurse Communication: Mobility status for transfers;Mobility status for ambulation General Behavior During Session: Rachel Bridges for tasks performed   Rachel Bridges 03/03/2011, 3:41 PM

## 2011-03-03 NOTE — Progress Notes (Signed)
Patient's stool is loose and foul smelling, yellow in color. Looks like possible C-Diff. Notifying the Dr. Stann Mainland continue to monitor. Rachel Bridges Rachel Bridges  03/03/2011 8:21 AM

## 2011-03-03 NOTE — Progress Notes (Signed)
Internal Medicine Teaching Service Attending Note Date: 03/03/2011  Patient name: Rachel Bridges  Medical record number: [id number]  Date of birth: 1925-04-16    This patient has been seen and discussed with the house staff. Please see their note for complete details. I concur with their findings with the following additions/corrections: Her Cr is improving today.  Awaiting C diff to further eval her diarrhea. Appreciate PT/OT eval.   Johny Sax 03/03/2011, 11:10 AM

## 2011-03-03 NOTE — Plan of Care (Signed)
Problem: Phase I Progression Outcomes Goal: OOB as tolerated unless otherwise ordered Outcome: Progressing OOB to chair

## 2011-03-03 NOTE — Progress Notes (Signed)
Subjective: Feeling better today.  Still tired and weak.  Eating well.  PT has seen and recommended HHPT.  Had some diarrhea overnight (5-6 stools).    Objective: Vital signs in last 24 hours: Filed Vitals:   03/02/11 0909 03/02/11 2230 03/03/11 0512 03/03/11 0557  BP:  137/82 100/60 106/64  Pulse:  76 81 68  Temp:  97.6 F (36.4 C) 98.1 F (36.7 C) 98.5 F (36.9 C)  TempSrc:  Oral Oral Oral  Resp:  19 20 18   Height: 5\' 7"  (1.702 m)     Weight: 122 lb 2.2 oz (55.4 kg)     SpO2:  97%  93%   Weight change:   Intake/Output Summary (Last 24 hours) at 03/03/11 0929 Last data filed at 03/03/11 0516  Gross per 24 hour  Intake      2 ml  Output      0 ml  Net      2 ml   Constitutional: Vital signs reviewed. Alert and oriented x3. NAD Cardiovascular: RRR, S1 normal, S2 normal, no MRG, pulses symmetric and intact bilaterally Pulmonary/Chest: mild basilar crackles noted bilaterally on inspiration, no wheezes, or rhonchi Abdominal: Soft. Non-tender, mildly distended, bowel sounds are normal, no masses, organomegaly, or guarding present.  Neurological: A&O x3, non-focal exam  Lab Results: Basic Metabolic Panel:  Lab 03/03/11 4098 03/02/11 0500  NA 142 142  K 3.7 3.8  CL 119* 116*  CO2 15* 16*  GLUCOSE 98 98  BUN 26* 27*  CREATININE 1.22* 1.36*  CALCIUM 8.1* 8.6  MG -- 1.5  PHOS -- --   Liver Function Tests:  Lab 03/01/11 1433  AST 11  ALT 9  ALKPHOS 126*  BILITOT 0.2*  PROT 7.1  ALBUMIN 2.6*   CBC:  Lab 03/03/11 0555 03/02/11 0500 03/01/11 1433  WBC 7.0 7.8 --  NEUTROABS -- -- 5.1  HGB 9.4* 9.6* --  HCT 28.9* 29.9* --  MCV 81.6 81.0 --  PLT 208 252 --   Thyroid Function Tests:  Lab 03/01/11 1915  TSH 2.908  T4TOTAL --  FREET4 --  T3FREE --  THYROIDAB --   Anemia Panel:  Lab 03/03/11 0555  VITAMINB12 --  FOLATE --  FERRITIN --  TIBC --  IRON --  RETICCTPCT 1.1   Misc. Labs: Anemia panel, CEA, and AM cortisol pending today still.  Micro  Results: Recent Results (from the past 240 hour(s))  CULTURE, SPUTUM-ASSESSMENT     Status: Normal   Collection Time   03/01/11  7:04 PM      Component Value Range Status Comment   Specimen Description SPUTUM   Final    Special Requests NONE   Final    Sputum evaluation     Final    Value: MICROSCOPIC FINDINGS SUGGEST THAT THIS SPECIMEN IS NOT REPRESENTATIVE OF LOWER RESPIRATORY SECRETIONS. PLEASE RECOLLECT.     CALLED TO S COLUMBRES RN 11.2.12 AT 1947 BY ROMEROJ   Report Status 03/01/2011 FINAL   Final   CULTURE, SPUTUM-ASSESSMENT     Status: Normal   Collection Time   03/01/11  9:46 PM      Component Value Range Status Comment   Specimen Description SPUTUM   Final    Special Requests NONE   Final    Sputum evaluation     Final    Value: MICROSCOPIC FINDINGS SUGGEST THAT THIS SPECIMEN IS NOT REPRESENTATIVE OF LOWER RESPIRATORY SECRETIONS. PLEASE RECOLLECT.     CALLED TO S.COLUMBRES,RN 2220 11/02.12 BY  M.CAMPBELL   Report Status 03/01/2011 FINAL   Final   CULTURE, SPUTUM-ASSESSMENT     Status: Normal   Collection Time   03/02/11  8:08 PM      Component Value Range Status Comment   Specimen Description SPUTUM   Final    Special Requests NONE   Final    Sputum evaluation     Final    Value: MICROSCOPIC FINDINGS SUGGEST THAT THIS SPECIMEN IS NOT REPRESENTATIVE OF LOWER RESPIRATORY SECRETIONS. PLEASE RECOLLECT.     NOTIFIED Rolinda Roan 2122 11.3.12   Report Status 03/02/2011 FINAL   Final    Studies/Results: Dg Chest 2 View  03/02/2011  *RADIOLOGY REPORT*  Clinical Data: Cough.  Abdominal distention.  CHEST - 2 VIEW  Comparison: 03/01/2011  Findings: Cardiac silhouette normal in size and configuration.  No mediastinal or hilar masses or adenopathy.  Mild thickening of the bronchovascular markings most evident in the lung bases, stable. Lungs are otherwise clear.  Bony thorax is demineralized but intact.  Colonic distention underneath the right hemidiaphragm has improved. No other change  from the previous day's study.  IMPRESSION: No acute cardiopulmonary disease.  Decreased colonic distention underneath the right hemidiaphragm.  Original Report Authenticated By:    Dg Chest 2 View  03/01/2011  *RADIOLOGY REPORT*  Clinical Data: Pneumonia, cough with sputum production  CHEST - 2 VIEW  Comparison: 06/13/2007  Findings: Normal heart size, mediastinal contours, and pulmonary vascularity. Lungs appear emphysematous but clear. No pleural effusion or pneumothorax. Mild elevation of right diaphragm with interposition of colon between liver and diaphragm. Osseous demineralization diffusely.  IMPRESSION: Emphysematous changes. No acute abnormalities.  Original Report Authenticated By: Lollie Marrow, M.D.   Medications: I have reviewed the patient's current medications. Scheduled Meds:   . aspirin EC  81 mg Oral Daily  . enoxaparin (LOVENOX) injection  30 mg Subcutaneous Q24H  . megestrol  800 mg Oral Daily  . sertraline  25 mg Oral QHS   Continuous Infusions:   . sodium chloride 100 mL/hr at 03/03/11 0300   PRN Meds:.guaiFENesin-codeine, ondansetron (ZOFRAN) IV Assessment/Plan: Patient Active Hospital Problem List: 1. Fatigue:  Likely multifactorial.  Currently investigating her anemia, diarrhea, and question of malignancy.  PT evaluated and will follow while she is in the hospital and recommended HHPT on discharge.  Currently she is eating well.    2. Diarrhea:  C. Diff pending.  She has not gotten any stool softener and states her last BM was Friday prior to admission.  We will continue to monitor.   3. Acute on chronic renal failure: Creatinine is near her baseline today.  Consider renal U/S, UA, spot protein, and Microscopy for since it appears her CKD has never been worked up in the past.  Will slow down her IV fluids and encourage her to drink well.  She has underlying Stage III CKD which is likely the cause of her anemia.  She has no outpatient nephrologist so we will likely  have to refer her at her follow up.   4. Adult failure to thrive: She has not been eating and drinking well at home likely secondary to multiple.  She has also lost about 25 lbs as evidenced in the outpatient record over the last year or so.  We will continue Megace at this time and follow her oral intake.  Consider Nutrition consult for possible supplementation,  5. Cough: Likely secondary to her dysphagia.  Swallow study yesterday was likely secondary to esophogeal dysmotility and  they suggested better compliance with her PPI/H2 blocker therapy.  6. Normocytic anemia: Anemia panel pending.  MCV is at the low end of normal so there may be a component of iron deficiency as well as Anemia of Chronic kidney disease.    LOS: 2 days   Ivadell Gaul 03/03/2011, 9:29 AM

## 2011-03-04 ENCOUNTER — Inpatient Hospital Stay (HOSPITAL_COMMUNITY): Payer: Medicare Other

## 2011-03-04 DIAGNOSIS — N179 Acute kidney failure, unspecified: Secondary | ICD-10-CM

## 2011-03-04 DIAGNOSIS — R5383 Other fatigue: Secondary | ICD-10-CM

## 2011-03-04 DIAGNOSIS — R627 Adult failure to thrive: Secondary | ICD-10-CM

## 2011-03-04 DIAGNOSIS — D649 Anemia, unspecified: Secondary | ICD-10-CM

## 2011-03-04 DIAGNOSIS — R5381 Other malaise: Secondary | ICD-10-CM

## 2011-03-04 LAB — BLOOD GAS, ARTERIAL
FIO2: 0.21 %
O2 Saturation: 96 %
Patient temperature: 98.6
TCO2: 14.9 mmol/L (ref 0–100)
pH, Arterial: 7.364 (ref 7.350–7.400)

## 2011-03-04 MED ORDER — COSYNTROPIN 0.25 MG IJ SOLR
0.2500 mg | Freq: Once | INTRAMUSCULAR | Status: DC
Start: 2011-03-04 — End: 2011-03-04
  Filled 2011-03-04: qty 0.25

## 2011-03-04 MED ORDER — BOOST / RESOURCE BREEZE PO LIQD
1.0000 | Freq: Three times a day (TID) | ORAL | Status: DC
  Administered 2011-03-04 – 2011-03-08 (×7): 1 via ORAL

## 2011-03-04 MED ORDER — FERROUS FUMARATE 325 (106 FE) MG PO TABS
1.0000 | ORAL_TABLET | Freq: Three times a day (TID) | ORAL | Status: DC
  Administered 2011-03-04 – 2011-03-09 (×9): 106 mg via ORAL
  Filled 2011-03-04 (×24): qty 1

## 2011-03-04 MED ORDER — ENOXAPARIN SODIUM 40 MG/0.4ML ~~LOC~~ SOLN
40.0000 mg | SUBCUTANEOUS | Status: DC
  Filled 2011-03-04 (×2): qty 0.4

## 2011-03-04 MED ORDER — COSYNTROPIN 0.25 MG IJ SOLR
0.2500 mg | Freq: Once | INTRAMUSCULAR | Status: AC
  Administered 2011-03-05: 0.25 mg via INTRAVENOUS
  Filled 2011-03-04: qty 0.25

## 2011-03-04 NOTE — Progress Notes (Signed)
Subjective: Feels okay, still coughing especially at night before bed. Diarrhea improved.  Objective: Vital signs in last 24 hours: Filed Vitals:   03/03/11 1347 03/03/11 1350 03/03/11 2055 03/04/11 0521  BP: 119/77 119/77 157/80 125/70  Pulse: 61 61 69 64  Temp: 97.8 F (36.6 C) 97.8 F (36.6 C) 97.6 F (36.4 C) 97.5 F (36.4 C)  TempSrc:  Oral Oral Oral  Resp: 19  19 18   Height:      Weight:      SpO2: 98% 98% 97% 97%   Weight change:   Intake/Output Summary (Last 24 hours) at 03/04/11 1028 Last data filed at 03/04/11 1000  Gross per 24 hour  Intake 1672.33 ml  Output    150 ml  Net 1522.33 ml   Physical Exam GEN: NAD.  Alert and oriented x 3.  Pleasant, conversant, and cooperative to exam. RESP:  CTAB, no w/r/r CARDIOVASCULAR: RRR, S1, S2, 2/6 HSM at R sternal border. ABDOMEN: soft, NT/ND, NABS EXT: warm and dry. No edema in b/l LE   Lab Results: Basic Metabolic Panel:  Lab 03/03/11 4098 03/02/11 0500  NA 142 142  K 3.7 3.8  CL 119* 116*  CO2 15* 16*  GLUCOSE 98 98  BUN 26* 27*  CREATININE 1.22* 1.36*  CALCIUM 8.1* 8.6  MG -- 1.5  PHOS -- --   Liver Function Tests:  Lab 03/01/11 1433  AST 11  ALT 9  ALKPHOS 126*  BILITOT 0.2*  PROT 7.1  ALBUMIN 2.6*   No results found for this basename: LIPASE:2,AMYLASE:2 in the last 168 hours No results found for this basename: AMMONIA:2 in the last 168 hours CBC:  Lab 03/03/11 0555 03/02/11 0500 03/01/11 1433  WBC 7.0 7.8 --  NEUTROABS -- -- 5.1  HGB 9.4* 9.6* --  HCT 28.9* 29.9* --  MCV 81.6 81.0 --  PLT 208 252 --   Thyroid Function Tests:  Lab 03/01/11 1915  TSH 2.908  T4TOTAL --  FREET4 --  T3FREE --  THYROIDAB --   Anemia Panel:  Lab 03/03/11 0555  VITAMINB12 190*  FOLATE 15.3  FERRITIN 67  TIBC 236*  IRON 22*  RETICCTPCT 1.1    Micro Results: Recent Results (from the past 240 hour(s))  CULTURE, SPUTUM-ASSESSMENT     Status: Normal   Collection Time   03/01/11  7:04 PM   Component Value Range Status Comment   Specimen Description SPUTUM   Final    Special Requests NONE   Final    Sputum evaluation     Final    Value: MICROSCOPIC FINDINGS SUGGEST THAT THIS SPECIMEN IS NOT REPRESENTATIVE OF LOWER RESPIRATORY SECRETIONS. PLEASE RECOLLECT.     CALLED TO S COLUMBRES RN 11.2.12 AT 1947 BY ROMEROJ   Report Status 03/01/2011 FINAL   Final   CULTURE, BLOOD (ROUTINE X 2)     Status: Normal (Preliminary result)   Collection Time   03/01/11  7:30 PM      Component Value Range Status Comment   Specimen Description BLOOD LEFT ARM   Final    Special Requests BOTTLES DRAWN AEROBIC AND ANAEROBIC [facility] EACH   Final    Setup Time [id number]   Final    Culture     Final    Value:        BLOOD CULTURE RECEIVED NO GROWTH TO DATE CULTURE WILL BE HELD FOR 5 DAYS BEFORE ISSUING A FINAL NEGATIVE REPORT   Report Status PENDING   Incomplete  CULTURE, BLOOD (ROUTINE X 2)     Status: Normal (Preliminary result)   Collection Time   03/01/11  7:40 PM      Component Value Range Status Comment   Specimen Description BLOOD LEFT ARM   Final    Special Requests BOTTLES DRAWN AEROBIC AND ANAEROBIC 5CC EACH   Final    Setup Time [id number]   Final    Culture     Final    Value:        BLOOD CULTURE RECEIVED NO GROWTH TO DATE CULTURE WILL BE HELD FOR 5 DAYS BEFORE ISSUING A FINAL NEGATIVE REPORT   Report Status PENDING   Incomplete   CULTURE, SPUTUM-ASSESSMENT     Status: Normal   Collection Time   03/01/11  9:46 PM      Component Value Range Status Comment   Specimen Description SPUTUM   Final    Special Requests NONE   Final    Sputum evaluation     Final    Value: MICROSCOPIC FINDINGS SUGGEST THAT THIS SPECIMEN IS NOT REPRESENTATIVE OF LOWER RESPIRATORY SECRETIONS. PLEASE RECOLLECT.     CALLED TO S.COLUMBRES,RN 2220 11/02.12 BY M.CAMPBELL   Report Status 03/01/2011 FINAL   Final   CULTURE, SPUTUM-ASSESSMENT     Status: Normal   Collection Time   03/02/11  8:08 PM      Component  Value Range Status Comment   Specimen Description SPUTUM   Final    Special Requests NONE   Final    Sputum evaluation     Final    Value: MICROSCOPIC FINDINGS SUGGEST THAT THIS SPECIMEN IS NOT REPRESENTATIVE OF LOWER RESPIRATORY SECRETIONS. PLEASE RECOLLECT.     NOTIFIED Rolinda Roan 2122 11.3.12   Report Status 03/02/2011 FINAL   Final   CLOSTRIDIUM DIFFICILE BY PCR     Status: Normal   Collection Time   03/03/11  8:30 AM      Component Value Range Status Comment   C difficile by pcr NEGATIVE  NEGATIVE  Final    Studies/Results: No results found. Medications: I have reviewed the patient's current medications. Scheduled Meds:   . aspirin EC  81 mg Oral Daily  . enoxaparin (LOVENOX) injection  40 mg Subcutaneous Q24H  . famotidine  40 mg Oral BID  . megestrol  800 mg Oral Daily  . sertraline  25 mg Oral QHS  . DISCONTD: enoxaparin (LOVENOX) injection  30 mg Subcutaneous Q24H   Continuous Infusions:   . sodium chloride 20 mL/hr at 03/03/11 1103   PRN Meds:.guaiFENesin-codeine, ondansetron (ZOFRAN) IV  Assessment/Plan: # Fatigue: Likely multifactorial. Currently investigating her anemia, diarrhea, and question of malignancy. PT evaluated and will follow while she is in the hospital and recommended HHPT on discharge.  Also component of poss adrenal insuff we will w/u. - see individual probs below for w/u  # Low Cortisol: low a.m. Cortisol concerning for adrenal insufficiency, which could explain fatigue, initial mild hypokalemia.  Will initiate w/u - ACTH, renin, aldosterone, levels. - ACTH stim test - lytes - consider adrenal CT if indicated   # Diarrhea: C. Diff neg. Says the diarrhea has improved and that is normal for her since her bowel surgeries many years ago. - con't to monitor  # Acute on chronic renal failure: Creatinine is near her baseline. Consider renal U/S, UA, spot protein, and Microscopy for since it appears her CKD has never been worked up in the past. Will  slow down her IV fluids and encourage  her to drink well. She has underlying Stage III CKD which is likely the cause of her anemia. She has no outpatient nephrologist so we will likely have to refer her at her follow up. - consider inpt vs outpt w/u of CKD   # Adult failure to thrive: She has not been eating and drinking well at home. She has also lost about 25 lbs as evidenced in the outpatient record over the last year or so. We will continue Megace at this time and follow her oral intake. - consider Nutrition consult for possible supplementation  # Cough: Likely secondary to her dysphagia. Swallow study yesterday was likely secondary to esophogeal dysmotility and they suggested better compliance with her PPI/H2 blocker therapy. - con't protonix - dinner more at 6 or 7 (said she sometimes eats at 8pm at home)  # Normocytic anemia: Anemia panel indicates component of Fe Def.  Likely component from CKD as well. - Fe tx  #Ppx Lovenox  #Dispo HHOT    LOS: 3 days   WILDMAN-TOBRINER, BEN 03/04/2011, 10:28 AM

## 2011-03-04 NOTE — Progress Notes (Signed)
INITIAL ADULT NUTRITION ASSESSMENT Date: 03/04/2011   Time: 12:48 PM Reason for Assessment: MD consult  ASSESSMENT: Female 77 y.o.  Dx: Adult failure to thrive  Hx:  Past Medical History  Diagnosis Date  . MGUS (monoclonal gammopathy of unknown significance)      [facility]  . Anxiety   . History of colon cancer      History of bowel  obstruction. on treatment with Colace and MiraLax.  followed by Dr. Loreta Ave and Dr. Purnell Shoemaker  . Depression   . Diverticulosis of colon   . Osteopenia   . Cellulitis      left elbow  . Lumbar spinal stenosis   . Atrophic vaginitis   . Renal cyst   . History of tobacco abuse   . Dermatitis   . Hip pain      History  . Actinic keratosis   . Dementia    Related Meds:     . aspirin EC  81 mg Oral Daily  . enoxaparin (LOVENOX) injection  40 mg Subcutaneous Q24H  . famotidine  40 mg Oral BID  . ferrous fumarate  1 tablet Oral TID  . megestrol  800 mg Oral Daily  . sertraline  25 mg Oral QHS  . DISCONTD: enoxaparin (LOVENOX) injection  30 mg Subcutaneous Q24H    Ht: 170 cm (67 in)  Wt: 122 lb 2.2 oz (55.4 kg)  Ideal Wt: 61.3 kg % Ideal Wt: 90%  Usual Wt: cannot obtain % Usual Wt: ---  Body mass index is 19.13 kg/(m^2).  Food/Nutrition Related Hx: poor appetite & nausea PTA; 25 pound weight loss x 1 year per outpatient records  Labs:      Sodium 142 mEq/L      Potassium 3.7 mEq/L      Chloride 119 mEq/L H     CO2 15 mEq/L L     Glucose, Bld 98 mg/dL      BUN 26 mg/dL H     Creatinine, Ser 1.61 mg/dL H     Calcium 8.1 mg/dL L     GFR calc non Af Amer 40 mL/min L     GFR calc Af Amer 46 mL/min    I/O last 3 completed shifts: In: 1674.3 [P.O.:722; I.V.:952.3] Out: 150 [Urine:150] Total I/O In: 240 [P.O.:240] Out: -    Diet Order: Gluten Free  Supplements/Tube Feeding: N/A  IVF:    sodium chloride Last Rate: 20 mL/hr at 03/03/11 1103    Estimated Nutritional Needs:   Kcal:  1,500-1,700 Protein: 70-80 gms Fluid: > 1.5 L  NUTRITION DIAGNOSIS: -Inadequate oral intake (NI-2.1).  Status: Ongoing  RELATED TO: poor appetite & fatigue, question malignancy  AS EVIDENCE BY: 50% consumption at meals, pt report  MONITORING/EVALUATION(Goals): Goal: pt to meet > 90% of estimated nutrition needs to prevent further weight loss Monitor: PO adequacy, weight, labs, skin integrity  EDUCATION NEEDS: -No education needs identified at this time  INTERVENTION:  1. Continue Megace 2. Add Resource Breeze supplement PO TID (250 kcals, 9 gms protein per 8 fl oz carton)  Dietitian #: [phone]  DOCUMENTATION CODES Per approved criteria  -Non-severe (moderate) malnutrition in the context of chronic illness given 17% wt loss x 1 year, < 75% intake of estimated energy requirement for > 1 month    Waylan Boga Federkiewicz 03/04/2011, 12:48 PM

## 2011-03-04 NOTE — Progress Notes (Signed)
Occupational Therapy Evaluation Patient Details Name: KARNA ABED MRN: [id number] DOB: [birth year] Today's Date: 03/04/2011  OT Assessment/Plan OT Assessment/Plan Comments on Treatment Session: Pt motivated and agreeable to OT session OT Frequency: Min 2X/week Follow Up Recommendations: Home health OT Equipment Recommended: None recommended by OT OT Goals    OT Treatment Precautions/Restrictions      ADL ADL Grooming: Brushing hair Where Assessed - Grooming: Sitting, bed Mobility  Bed Mobility Bed Mobility: Yes Rolling Right: 5: Supervision Supine to Sit: 5: Supervision Transfers Transfers: Yes Sit to Stand: 4: Min assist Exercises    End of Session OT - End of Session Activity Tolerance: Patient limited by fatigue Patient left: in bed;with call bell in reach General Behavior During Session: [facility] for tasks performed  Kirt Boys  03/04/2011, 10:45 AM

## 2011-03-04 NOTE — Progress Notes (Signed)
Internal Medicine Attending  Date: 03/04/2011  Patient name: Rachel Bridges Medical record number: [id number] Date of birth: 11-26-1924 Age: 77 y.o. Gender: female  I saw and evaluated the patient.  I reviewed the resident's note by Dr. Abner Greenspan and I agree with the resident's findings and plan as documented in the resident's note, with exceptions and comments as noted below.  Patient has a low bicarbonate of 15 with a non-anion gap metabolic acidosis; this may represent bicarbonate loss and/or effects of hypoadrenalism.  Would check arterial blood gas to document her acid-base status.  Given her weight loss and history of both colon cancer and a monoclonal gammopathy, there is concern for underlying malignancy as a cause of her weight loss.  She may need CT imaging of her abdomen; would discuss her weight loss with her oncologist and her gastroenterologist.

## 2011-03-04 NOTE — Progress Notes (Signed)
Physical Therapy Treatment Patient Details Name: ARTRICE KRAKER MRN: [id number] DOB: Nov 12, 1924 Today's Date: 03/04/2011  PT Assessment/Plan  PT - Assessment/Plan Comments on Treatment Session: Pt reports her coughing isnt better and that is what brought her in to the hospital PT Plan: Discharge plan remains appropriate;Frequency remains appropriate PT Frequency: Min 3X/week Follow Up Recommendations: Home health PT Equipment Recommended: None recommended by PT PT Goals  Acute Rehab PT Goals PT Goal Formulation: With patient Time For Goal Achievement: 2 weeks Pt will Roll Supine to Right Side: with modified independence PT Goal: Rolling Supine to Right Side - Progress: Other (comment) (pt already up in the chair) Pt will Roll Supine to Left Side: with modified independence PT Goal: Rolling Supine to Left Side - Progress: Other (comment) (Pt already up in the chair) Pt will go Supine/Side to Sit: with modified independence PT Goal: Supine/Side to Sit - Progress: Other (comment) (Pt already up in the chair.) Pt will Transfer Sit to Stand/Stand to Sit: with supervision PT Transfer Goal: Sit to Stand/Stand to Sit - Progress: Met Pt will Ambulate: 51 - 150 feet;with supervision;with rolling walker PT Goal: Ambulate - Progress: Progressing toward goal  PT Treatment Precautions/Restrictions  Precautions Precautions: Fall Restrictions Weight Bearing Restrictions: No Mobility (including Balance) Bed Mobility Bed Mobility: No Rolling Right: 5: Supervision Supine to Sit: 5: Supervision Transfers Sit to Stand: 5: Supervision;With armrests;From chair/3-in-1 Stand to Sit: 4: Min assist;With upper extremity assist;To chair/3-in-1 Stand to Sit Details: Pt needed cues to keep her walker with her to back up to the chair Ambulation/Gait Ambulation/Gait: Yes Ambulation/Gait Assistance: 4: Min assist Ambulation/Gait Assistance Details (indicate cue type and reason): Cues to stand up tall in  her walker Ambulation Distance (Feet): 70 Feet Assistive device: Rolling walker Gait Pattern: Shuffle;Trunk flexed Gait velocity: Pt with slow gait pattern Stairs: No Wheelchair Mobility Wheelchair Mobility: No    End of Session PT - End of Session Activity Tolerance: Patient tolerated treatment well Patient left: in chair;with call bell in reach General Behavior During Session: [facility] for tasks performed Cognition: [facility] for tasks performed  Judson Roch 03/04/2011, 11:07 AM

## 2011-03-04 NOTE — Progress Notes (Signed)
Patient finished contrast at 1905. RN stated to patient that she was not able to eat tray until CT of abdomen was completed. Tray was placed on the other side of room by staff to make sure that patient did not eat tray. Patient stated that she called to someone in the hallway and they brought her the tray from the other side of the room so that she could eat it. RN saw patient at [employer] tray. RN staff called CT about issue. Scan will be pushed back to later on tonight. Will continue to monitor. Madilyn Fireman Garibaldi

## 2011-03-05 ENCOUNTER — Other Ambulatory Visit (HOSPITAL_COMMUNITY): Payer: Medicare Other

## 2011-03-05 ENCOUNTER — Inpatient Hospital Stay (HOSPITAL_COMMUNITY): Payer: Medicare Other

## 2011-03-05 ENCOUNTER — Encounter: Payer: Medicare Other | Admitting: Internal Medicine

## 2011-03-05 LAB — BASIC METABOLIC PANEL
CO2: 17 mEq/L — ABNORMAL LOW (ref 19–32)
Chloride: 116 mEq/L — ABNORMAL HIGH (ref 96–112)
Creatinine, Ser: 1.24 mg/dL — ABNORMAL HIGH (ref 0.50–1.10)
GFR calc Af Amer: 45 mL/min — ABNORMAL LOW (ref >90)
Potassium: 3.5 mEq/L (ref 3.5–5.1)

## 2011-03-05 LAB — URINE MICROSCOPIC-ADD ON

## 2011-03-05 LAB — URINALYSIS, ROUTINE W REFLEX MICROSCOPIC
Glucose, UA: NEGATIVE mg/dL
Hgb urine dipstick: NEGATIVE
Ketones, ur: NEGATIVE mg/dL
Leukocytes, UA: NEGATIVE
Protein, ur: 30 mg/dL — AB
pH: 5 (ref 5.0–8.0)

## 2011-03-05 LAB — CBC
HCT: 30.4 % — ABNORMAL LOW (ref 36.0–46.0)
Hemoglobin: 9.8 g/dL — ABNORMAL LOW (ref 12.0–15.0)
MCV: 80.6 fL (ref 78.0–100.0)
RBC: 3.77 MIL/uL — ABNORMAL LOW (ref 3.87–5.11)
WBC: 7.7 10*3/uL (ref 4.0–10.5)

## 2011-03-05 LAB — PROTEIN / CREATININE RATIO, URINE
Creatinine, Urine: 66.32 mg/dL
Protein Creatinine Ratio: 0.4 — ABNORMAL HIGH (ref 0.00–0.15)
Total Protein, Urine: 26.8 mg/dL

## 2011-03-05 LAB — NA AND K (SODIUM & POTASSIUM), RAND UR: Sodium, Ur: 65 mEq/L

## 2011-03-05 NOTE — Progress Notes (Signed)
Patient was having some bright red blood from rectum area after patient had a bowel movement. Blood is scant. MD was notified due to patient having a history of colon cancer and diverticulitis. MD Clyde Lundborg ordered for Lovenox to be held, for SCD's to be placed and for CBC to be checked this morning. Will continue to monitor. Rachel Bridges [place]

## 2011-03-05 NOTE — Progress Notes (Signed)
Subjective: Feels about the same, no c/o.  Had bowel movement yesterday, RN noted some blood on the pad afterwards, no blood in stool.  Pt says this is normal for her.  Pt had colonoscopy in 2009 c normal biopsies but diverticulosis.  Objective: Vital signs in last 24 hours: Filed Vitals:   03/04/11 1040 03/04/11 1417 03/04/11 2159 03/05/11 0548  BP:  124/78 120/62 142/70  Pulse: 78 69 66 62  Temp:  98.3 F (36.8 C) 97.6 F (36.4 C) 98.4 F (36.9 C)  TempSrc:   Oral Oral  Resp:  18 22 18   Height:      Weight:      SpO2: 95% 96%  95%   Weight change:   Intake/Output Summary (Last 24 hours) at 03/05/11 0858 Last data filed at 03/05/11 0400  Gross per 24 hour  Intake 1031.66 ml  Output      0 ml  Net 1031.66 ml   Physical Exam: GEN: NAD. Alert and oriented x 3. Pleasant, conversant, and cooperative to exam.  RESP: CTAB, no w/r/r  CARDIOVASCULAR: RRR, S1, S2, 2/6 HSM at R sternal border.  ABDOMEN: soft, NT, moderate distention in mid abdomen, NABS + occasional high pitched tinkling. EXT: warm and dry. No edema in b/l LE  Lab Results: Basic Metabolic Panel:  Lab 03/05/11 1610 03/03/11 0555 03/02/11 0500  NA 141 142 --  K 3.5 3.7 --  CL 116* 119* --  CO2 17* 15* --  GLUCOSE 93 98 --  BUN 20 26* --  CREATININE 1.24* 1.22* --  CALCIUM 8.6 8.1* --  MG -- -- 1.5  PHOS -- -- --   Liver Function Tests:  Lab 03/01/11 1433  AST 11  ALT 9  ALKPHOS 126*  BILITOT 0.2*  PROT 7.1  ALBUMIN 2.6*   No results found for this basename: LIPASE:2,AMYLASE:2 in the last 168 hours No results found for this basename: AMMONIA:2 in the last 168 hours CBC:  Lab 03/05/11 0530 03/03/11 0555 03/01/11 1433  WBC 7.7 7.0 --  NEUTROABS -- -- 5.1  HGB 9.8* 9.4* --  HCT 30.4* 28.9* --  MCV 80.6 81.6 --  PLT 231 208 --   Cardiac Enzymes: No results found for this basename: CKTOTAL:3,CKMB:3,CKMBINDEX:3,TROPONINI:3 in the last 168 hours BNP:  Lab 03/04/11 1120  POCBNP 1432.0*    D-Dimer: No results found for this basename: DDIMER:2 in the last 168 hours CBG: No results found for this basename: GLUCAP:6 in the last 168 hours Hemoglobin A1C: No results found for this basename: HGBA1C in the last 168 hours Fasting Lipid Panel: No results found for this basename: CHOL,HDL,LDLCALC,TRIG,CHOLHDL,LDLDIRECT in the last 960 hours Thyroid Function Tests:  Lab 03/01/11 1915  TSH 2.908  T4TOTAL --  FREET4 --  T3FREE --  THYROIDAB --   Coagulation: No results found for this basename: LABPROT:4,INR:4 in the last 168 hours Anemia Panel:  Lab 03/03/11 0555  VITAMINB12 190*  FOLATE 15.3  FERRITIN 67  TIBC 236*  IRON 22*  RETICCTPCT 1.1    Micro Results: Recent Results (from the past 240 hour(s))  CULTURE, SPUTUM-ASSESSMENT     Status: Normal   Collection Time   03/01/11  7:04 PM      Component Value Range Status Comment   Specimen Description SPUTUM   Final    Special Requests NONE   Final    Sputum evaluation     Final    Value: MICROSCOPIC FINDINGS SUGGEST THAT THIS SPECIMEN IS NOT REPRESENTATIVE OF  LOWER RESPIRATORY SECRETIONS. PLEASE RECOLLECT.     CALLED TO S COLUMBRES RN 11.2.12 AT 1947 BY ROMEROJ   Report Status 03/01/2011 FINAL   Final   CULTURE, BLOOD (ROUTINE X 2)     Status: Normal (Preliminary result)   Collection Time   03/01/11  7:30 PM      Component Value Range Status Comment   Specimen Description BLOOD LEFT ARM   Final    Special Requests BOTTLES DRAWN AEROBIC AND ANAEROBIC 5CC EACH   Final    Setup Time [id number]   Final    Culture     Final    Value:        BLOOD CULTURE RECEIVED NO GROWTH TO DATE CULTURE WILL BE HELD FOR 5 DAYS BEFORE ISSUING A FINAL NEGATIVE REPORT   Report Status PENDING   Incomplete   CULTURE, BLOOD (ROUTINE X 2)     Status: Normal (Preliminary result)   Collection Time   03/01/11  7:40 PM      Component Value Range Status Comment   Specimen Description BLOOD LEFT ARM   Final    Special Requests BOTTLES  DRAWN AEROBIC AND ANAEROBIC 5CC EACH   Final    Setup Time [id number]   Final    Culture     Final    Value:        BLOOD CULTURE RECEIVED NO GROWTH TO DATE CULTURE WILL BE HELD FOR 5 DAYS BEFORE ISSUING A FINAL NEGATIVE REPORT   Report Status PENDING   Incomplete   CULTURE, SPUTUM-ASSESSMENT     Status: Normal   Collection Time   03/01/11  9:46 PM      Component Value Range Status Comment   Specimen Description SPUTUM   Final    Special Requests NONE   Final    Sputum evaluation     Final    Value: MICROSCOPIC FINDINGS SUGGEST THAT THIS SPECIMEN IS NOT REPRESENTATIVE OF LOWER RESPIRATORY SECRETIONS. PLEASE RECOLLECT.     CALLED TO S.COLUMBRES,RN 2220 11/02.12 BY M.CAMPBELL   Report Status 03/01/2011 FINAL   Final   CULTURE, SPUTUM-ASSESSMENT     Status: Normal   Collection Time   03/02/11  8:08 PM      Component Value Range Status Comment   Specimen Description SPUTUM   Final    Special Requests NONE   Final    Sputum evaluation     Final    Value: MICROSCOPIC FINDINGS SUGGEST THAT THIS SPECIMEN IS NOT REPRESENTATIVE OF LOWER RESPIRATORY SECRETIONS. PLEASE RECOLLECT.     NOTIFIED Rolinda Roan 2122 11.3.12   Report Status 03/02/2011 FINAL   Final   CLOSTRIDIUM DIFFICILE BY PCR     Status: Normal   Collection Time   03/03/11  8:30 AM      Component Value Range Status Comment   C difficile by pcr NEGATIVE  NEGATIVE  Final    Studies/Results: Ct Abdomen Pelvis Wo Contrast  03/05/2011  *RADIOLOGY REPORT*  Clinical Data: Evaluate for possible adrenal pathology.  CT ABDOMEN AND PELVIS WITHOUT CONTRAST  Technique:  Multidetector CT imaging of the abdomen and pelvis was performed following the standard protocol without intravenous contrast.  Comparison: 10/17/2009, 08/03/2007.  Findings: Coronary artery calcification.  Trace pericardial fluid.  Intra-abdominal organ evaluation is limited without intravenous contrast.  Within this limitation, unremarkable liver, spleen, pancreas, right adrenal  gland.  The left adrenal gland is nodular in configuration without a dominant nodule.  This is similar to the comparison exam.  Bilateral renal cysts are incompletely characterized however without significant interval change.  No hydronephrosis or hydroureter.  Colonic diverticulosis. Status post partial right colectomy. Markedly dilated bowel in the right hemiabdomen, similar to slightly increased from the comparison study. The hepatic flexure measures up to 8 cm. Thickened appearance to the ascending colon may be exaggerated by incomplete distension.  No pericolonic fat stranding.  The dilated bowel displaces the right intra-abdominal contents  medially.  No free intraperitoneal air or fluid.  Thin-walled bladder.  Unremarkable uterus and adnexa. Enlarged mesenteric lymph nodes.  There is scattered atherosclerotic calcification of the aorta and its branches. No aneurysmal dilatation.  Postsurgical changes L3-L5.  No acute osseous abnormality.    IMPRESSION: Marked proximal colonic dilatation without definite evidence for complete obstruction.  Nodular left adrenal gland without a dominant nodule.  This is nonspecific however unchanged from 2009.  Enlarged mesenteric lymph nodes, similar to the 2009 examination.  Original Report Authenticated By: Waneta Martins, M.D.   US Renal  03/04/2011  *RADIOLOGY REPORT*  Clinical Data: 77 year old female with acute on chronic renal failure.  RENAL/URINARY TRACT ULTRASOUND COMPLETE  Comparison:  CT abdomen pelvis 10/17/2009.  Findings:  Right Kidney:  No hydronephrosis.  Stable and simple by ultrasound appearing cystic lesions, the largest in the upper pole measuring 36-43 mm in diameter. Renal length 11.1 cm.  Left Kidney:  Distortion of the left renal contour related to chronic large exophytic cystic lesions.  The largest is bladder shape measuring up to 13.4 cm across.  These appear simple.  No hydronephrosis.  Renal parenchymal length approximate 13.2 cm.   Bladder:  Decompressed/not visualized.  IMPRESSION: Multiple chronic benign appearing renal cysts, large and exophytic on the left.  No acute renal findings by ultrasound.  Original Report Authenticated By: Harley Hallmark, M.D.   Medications: I have reviewed the patient's current medications. Scheduled Meds:   . aspirin EC  81 mg Oral Daily  . cosyntropin  0.25 mg Intravenous Once  . famotidine  40 mg Oral BID  . feeding supplement  1 Container Oral TID WC  . ferrous fumarate  1 tablet Oral TID  . megestrol  800 mg Oral Daily  . sertraline  25 mg Oral QHS  . DISCONTD: cosyntropin  0.25 mg Intravenous Once  . DISCONTD: enoxaparin (LOVENOX) injection  40 mg Subcutaneous Q24H   Continuous Infusions:   . sodium chloride 20 mL/hr at 03/04/11 2108   PRN Meds:.guaiFENesin-codeine, ondansetron (ZOFRAN) IV  Assessment/Plan: # Fatigue: Likely multifactorial. Currently investigating her anemia, diarrhea/GI probs, and question of malignancy. PT evaluated and will follow while she is in the hospital and recommended HHPT on discharge. Also component of poss adrenal insuff we will w/u.  - see individual probs below for w/u - consider lung noncon CT  # Low Cortisol: low a.m. Cortisol concerning for adrenal insufficiency, which could explain fatigue, initial mild hypokalemia. Oral contast only abdominal CT did not reveal any adrenal masses. - ACTH, renin, aldosterone, levels pending - ACTH stim test pending - urine lytes pending - consider endocrine consult pending results  # Colonic enlargement: CT shows nearly 8cm dilated colon, no definite obstruction.  Had stool yesterday.  Minimal pain, no N/V.  Pt c extensive GI h/x including hemicolectomy and various obstructions over the years.  Last colonoscopy in 2009 was negative for malignancy, showed diverticulosis.  Given complicated h/x, will consult GI.  C. Diff neg. Had blood on pad after stool yesterday, says she occasionally sees blood like  that.   Ddx includes partial obstruction 2/2 adhesions, CA, or other pathology vs. Ileus. - GI consult - con't to monitor   # Acute on chronic renal failure: Creatinine is near her baseline. Renal u/s unremarkable.  Urine lytes still pending.  Consider renal U/S, UA, spot protein, and Microscopy for since it appears her CKD has never been worked up in the past. Will slow down her IV fluids and encourage her to drink well. She has underlying Stage III CKD which is likely the cause of her anemia. She has no outpatient nephrologist so we will likely have to refer her at her follow up.  - f/u labs   # Adult failure to thrive: She has not been eating and drinking well at home. She has also lost about 25 lbs as evidenced in the outpatient record over the last year or so. We will continue Megace at this time and follow her oral intake.  - per Nutrition consult, starting breeze supplement  # Cough: Likely secondary to her dysphagia. Swallow study yesterday was likely secondary to esophogeal dysmotility and they suggested better compliance with her PPI/H2 blocker therapy.  - con't protonix  - dinner more at 6 or 7 (said she sometimes eats at 8pm at home)   # Normocytic anemia: Anemia panel indicates component of Fe Def. Likely component from CKD as well.  - Fe tx   #Ppx Lovenox   #Dispo  HHOT    LOS: 4 days   WILDMAN-TOBRINER, BEN 03/05/2011, 8:58 AM

## 2011-03-05 NOTE — Progress Notes (Signed)
Physical Therapy Treatment Patient Details Name: Rachel Bridges MRN: [id number] DOB: [birth year] Today's Date: 03/05/2011  PT Assessment/Plan  PT - Assessment/Plan Comments on Treatment Session: pt cont to improve with mobiliy PT Plan: Discharge plan remains appropriate PT Frequency: Min 3X/week Follow Up Recommendations: Home health PT Equipment Recommended: None recommended by PT PT Goals  Acute Rehab PT Goals PT Goal Formulation: With patient Time For Goal Achievement: 2 weeks Pt will Roll Supine to Right Side: with modified independence PT Goal: Rolling Supine to Right Side - Progress: Met Pt will Roll Supine to Left Side: with modified independence PT Goal: Rolling Supine to Left Side - Progress: Met Pt will go Supine/Side to Sit: with modified independence PT Goal: Supine/Side to Sit - Progress: Met Pt will Transfer Sit to Stand/Stand to Sit: with supervision PT Transfer Goal: Sit to Stand/Stand to Sit - Progress: Met Pt will Ambulate: 51 - 150 feet;with supervision;with rolling walker PT Goal: Ambulate - Progress: Progressing toward goal  PT Treatment Precautions/Restrictions  Precautions Precautions: Fall Restrictions Weight Bearing Restrictions: No Mobility (including Balance) Bed Mobility Bed Mobility: Yes Left Sidelying to Sit: 6: Modified independent (Device/Increase time) Sit to Supine - Left: 6: Modified independent (Device/Increase time) Transfers Transfers: Yes Sit to Stand: 5: Supervision Stand to Sit: 5: Supervision Ambulation/Gait Ambulation/Gait: Yes Ambulation/Gait Assistance: 5: Supervision;4: Min assist Ambulation/Gait Assistance Details (indicate cue type and reason): cues to stay within walker (Min assist with change in direction.  ) Ambulation Distance (Feet): 150 Feet Assistive device: Rolling walker Gait Pattern: Trunk flexed Stairs: No    Exercise    End of Session PT - End of Session Equipment Utilized During Treatment: Gait  belt Activity Tolerance: Patient tolerated treatment well Patient left: in bed;with family/visitor present Nurse Communication: Mobility status for transfers;Mobility status for ambulation General Behavior During Session: [facility] for tasks performed Cognition: [facility] for tasks performed  Rachel Bridges 03/05/2011, 2:55 PM

## 2011-03-05 NOTE — Consult Note (Signed)
Reason for Consult:Abnormal CT scan - Dilated colon and history of colon cancer in 2001 Referring Physician: Internal Medicine Teaching Service  Rachel Bridges is an 77 y.o. female.  HPI: The patient was admitted for weakness, cough, and fatigue.  She was recently treated for a bronchitis, but her symptoms have not improved.  The patient was subsequently admitted from the clinic for further evaluation.  Because of her history of colon cancer and her current symptoms a CT scan of the ABM/Pelvis was performed and it revealed a dilated colon and small bowel.  This is unchanged from the prior CT scans dating back to 2009.  Her last colonoscopy was with Dr. Loreta Ave in 2009 with findings of an intact ileocolonic anastamosis and diverticulosis.  No evidence of any malignant recurrence.  She currently denies any issues with constipation, but she had a very brief episode of diarrhea.  There is also a report of a brief episode of hematochezia.  No complaints of any abdominal pain.  Past Medical History  Diagnosis Date  . MGUS (monoclonal gammopathy of unknown significance)      [facility]  . Anxiety   . History of colon cancer      History of bowel  obstruction. on treatment with Colace and MiraLax.  followed by Dr. Loreta Ave and Dr. Purnell Shoemaker  . Depression   . Diverticulosis of colon   . Osteopenia   . Cellulitis      left elbow  . Lumbar spinal stenosis   . Atrophic vaginitis   . Renal cyst   . History of tobacco abuse   . Dermatitis   . Hip pain      History  . Actinic keratosis   . Dementia     Past Surgical History  Procedure Date  . Hemicolectomy      Right-sided  .  resection of ovarian cyst   . Cataract extraction      Left eye  . Knee arthroscopy      Left knee  . Appendectomy   . Carpal tunnel release   . Lumbar laminectomy   . Lumbar fusion     No family history on file.  Social History:  reports that she quit smoking about 22 years  ago. She does not have any smokeless tobacco history on file. She reports that she does not drink alcohol or use illicit drugs.  Allergies:  Allergies  Allergen Reactions  . Morphine And Related Other (See Comments)    delirium  . Oxycodone Hcl (Oxyir) Other (See Comments)    Unknown reaction  . Pantoprazole (Protonix) Nausea Only    Medications: I have reviewed the patient's current medications.  Results for orders placed during the hospital encounter of 03/01/11 (from the past 48 hour(s))  ACTH     Status: Abnormal   Collection Time   03/04/11 11:20 AM      Component Value Range Comment   C206 ACTH <5 (*) 10 - 46 (pg/mL)   PRO B NATRIURETIC PEPTIDE     Status: Abnormal   Collection Time   03/04/11 11:20 AM      Component Value Range Comment   BNP, POC 1432.0 (*) 0 - 450 (pg/mL)   BLOOD GAS, ARTERIAL     Status: Abnormal   Collection Time   03/04/11  2:35 PM      Component Value Range Comment   FIO2 .21      pH, Arterial 7.364  7.350 -  7.400     pCO2 arterial 25.4 (*) 35.0 - 45.0 (mmHg)    pO2, Arterial 90.6  80.0 - 100.0 (mmHg)    Bicarbonate 14.1 (*) 20.0 - 24.0 (mEq/L)    TCO2 14.9  0 - 100 (mmol/L)    Acid-base deficit 10.2 (*) 0.0 - 2.0 (mmol/L)    O2 Saturation 96.0      Patient temperature 98.6      Collection site RIGHT RADIAL      Drawn by [phone]      Sample type ARTERIAL DRAW      Allens test (pass/fail) PASS  PASS    BASIC METABOLIC PANEL     Status: Abnormal   Collection Time   03/05/11  5:30 AM      Component Value Range Comment   Sodium 141  135 - 145 (mEq/L)    Potassium 3.5  3.5 - 5.1 (mEq/L)    Chloride 116 (*) 96 - 112 (mEq/L)    CO2 17 (*) 19 - 32 (mEq/L)    Glucose, Bld 93  70 - 99 (mg/dL)    BUN 20  6 - 23 (mg/dL)    Creatinine, Ser 5.62 (*) 0.50 - 1.10 (mg/dL)    Calcium 8.6  8.4 - 10.5 (mg/dL)    GFR calc non Af Amer 39 (*) >90 (mL/min)    GFR calc Af Amer 45 (*) >90 (mL/min)   CBC     Status: Abnormal   Collection Time   03/05/11  5:30 AM        Component Value Range Comment   WBC 7.7  4.0 - 10.5 (K/uL)    RBC 3.77 (*) 3.87 - 5.11 (MIL/uL)    Hemoglobin 9.8 (*) 12.0 - 15.0 (g/dL)    HCT 13.0 (*) 86.5 - 46.0 (%)    MCV 80.6  78.0 - 100.0 (fL)    MCH 26.0  26.0 - 34.0 (pg)    MCHC 32.2  30.0 - 36.0 (g/dL)    RDW 78.4  69.6 - 29.5 (%)    Platelets 231  150 - 400 (K/uL)   PROTEIN / CREATININE RATIO, URINE     Status: Abnormal   Collection Time   03/05/11 11:20 AM      Component Value Range Comment   Creatinine, Urine 66.32      Total Protein, Urine 26.8   NO NORMAL RANGE ESTABLISHED FOR THIS TEST   PROTEIN CREATININE RATIO 0.40 (*) 0.00 - 0.15    NA AND K (SODIUM & POTASSIUM), RAND UR     Status: Normal   Collection Time   03/05/11 11:20 AM      Component Value Range Comment   Sodium, Ur 65      Potassium Urine Timed 48     CHLORIDE, URINE, RANDOM     Status: Normal   Collection Time   03/05/11 11:20 AM      Component Value Range Comment   Chloride Urine 118     URINALYSIS, ROUTINE W REFLEX MICROSCOPIC     Status: Abnormal   Collection Time   03/05/11 11:20 AM      Component Value Range Comment   Color, Urine YELLOW  YELLOW     Appearance CLEAR  CLEAR     Specific Gravity, Urine 1.013  1.005 - 1.030     pH 5.0  5.0 - 8.0     Glucose, UA NEGATIVE  NEGATIVE (mg/dL)    Hgb urine dipstick NEGATIVE  NEGATIVE  Bilirubin Urine NEGATIVE  NEGATIVE     Ketones, ur NEGATIVE  NEGATIVE (mg/dL)    Protein, ur 30 (*) NEGATIVE (mg/dL)    Urobilinogen, UA 0.2  0.0 - 1.0 (mg/dL)    Nitrite NEGATIVE  NEGATIVE     Leukocytes, UA NEGATIVE  NEGATIVE    URINE MICROSCOPIC-ADD ON     Status: Abnormal   Collection Time   03/05/11 11:20 AM      Component Value Range Comment   Squamous Epithelial / LPF RARE  RARE     WBC, UA 0-2  <3 (WBC/hpf)    RBC / HPF 0-2  <3 (RBC/hpf)    Bacteria, UA RARE  RARE     Casts HYALINE CASTS (*) NEGATIVE  GRANULAR CAST    Ct Abdomen Pelvis Wo Contrast  03/05/2011  *RADIOLOGY REPORT*  Clinical Data:  Evaluate for possible adrenal pathology.  CT ABDOMEN AND PELVIS WITHOUT CONTRAST  Technique:  Multidetector CT imaging of the abdomen and pelvis was performed following the standard protocol without intravenous contrast.  Comparison: 10/17/2009, 08/03/2007.  Findings: Coronary artery calcification.  Trace pericardial fluid.  Intra-abdominal organ evaluation is limited without intravenous contrast.  Within this limitation, unremarkable liver, spleen, pancreas, right adrenal gland.  The left adrenal gland is nodular in configuration without a dominant nodule.  This is similar to the comparison exam.  Bilateral renal cysts are incompletely characterized however without significant interval change.  No hydronephrosis or hydroureter.  Colonic diverticulosis. Status post partial right colectomy. Markedly dilated bowel in the right hemiabdomen, similar to slightly increased from the comparison study. The hepatic flexure measures up to 8 cm. Thickened appearance to the ascending colon may be exaggerated by incomplete distension.  No pericolonic fat stranding.  The dilated bowel displaces the right intra-abdominal contents  medially.  No free intraperitoneal air or fluid.  Thin-walled bladder.  Unremarkable uterus and adnexa. Enlarged mesenteric lymph nodes.  There is scattered atherosclerotic calcification of the aorta and its branches. No aneurysmal dilatation.  Postsurgical changes L3-L5.  No acute osseous abnormality.  IMPRESSION: Marked proximal colonic dilatation without definite evidence for complete obstruction.  Nodular left adrenal gland without a dominant nodule.  This is nonspecific however unchanged from 2009.  Enlarged mesenteric lymph nodes, similar to the 2009 examination.  Original Report Authenticated By: Waneta Martins, M.D.   Ct Chest Wo Contrast  03/05/2011  *RADIOLOGY REPORT*  Clinical Data: Weight loss.  Cough.  Smoker.  Rule out lung mass.  CT CHEST WITHOUT CONTRAST  Technique:  Multidetector  CT imaging of the chest was performed following the standard protocol without IV contrast.  Comparison: 03/02/2011 chest x-ray.  03/09/2008 chest CT.  Findings: Right middle lobe 7.9 mm nodule (series 3 image 32), left lower lobe 4.7 mm nodule (series 3 image 32) and minimal hazy parenchymal changes along the peripheral aspect of the left upper lobe (series 3 image 18) unchanged since prior exam.  New areas of minimal atelectasis/scarring posterior lungs bilaterally.  No interval development of worrisome pulmonary parenchymal lesion.  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  This can be followed with thyroid ultrasound.  Coronary artery calcifications.  Atherosclerotic type changes of the thoracic aorta with the ascending thoracic aorta measuring up to 3.7 cm.  No mediastinal, obvious hilar or axillary adenopathy.  Gas and fluid filled dilated colon extending below the hemidiaphragm.  This displaces and compresses the liver unchanged from prior exam.  Nodularity left adrenal gland and left renal cysts  without significant change with the exception of slight increase in size of left renal cyst.  No new bony destructive lesion.  Degenerative changes, postsurgical changes and hemangioma unchanged.  IMPRESSION: Stable chest parenchymal changes (as detailed above) without worrisome mass suspicious for pulmonary malignancy.  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  This can be followed with thyroid ultrasound.  Coronary artery calcifications.  Atherosclerotic type changes of the thoracic aorta with the ascending thoracic aorta measuring up to 3.7 cm.  Gas and fluid filled dilated colon extending below the hemidiaphragm.  This displaces and compresses the liver unchanged from prior exam.  Nodularity left adrenal gland and left renal cysts without significant change with the exception of slight increase in size of left renal cyst.  Original Report Authenticated By: Fuller Canada,  M.D.   US Renal  03/04/2011  *RADIOLOGY REPORT*  Clinical Data: 77 year old female with acute on chronic renal failure.  RENAL/URINARY TRACT ULTRASOUND COMPLETE  Comparison:  CT abdomen pelvis 10/17/2009.  Findings:  Right Kidney:  No hydronephrosis.  Stable and simple by ultrasound appearing cystic lesions, the largest in the upper pole measuring 36-43 mm in diameter. Renal length 11.1 cm.  Left Kidney:  Distortion of the left renal contour related to chronic large exophytic cystic lesions.  The largest is bladder shape measuring up to 13.4 cm across.  These appear simple.  No hydronephrosis.  Renal parenchymal length approximate 13.2 cm.  Bladder:  Decompressed/not visualized.  IMPRESSION: Multiple chronic benign appearing renal cysts, large and exophytic on the left.  No acute renal findings by ultrasound.  Original Report Authenticated By: Ulla Potash III, M.D.    ROS:  As stated above in the HPI otherwise negative.  Blood pressure 142/70, pulse 62, temperature 98.4 F (36.9 C), temperature source Oral, resp. rate 18, height 5\' 7"  (1.702 m), weight 55.4 kg (122 lb 2.2 oz), SpO2 95.00%.  PE: Gen: NAD, Alert and Oriented HEENT:  [place]/AT, EOMI Neck: Supple, no LAD Lungs: CTA Bilaterally CV: RRR without M/G/R ABM: Soft, NTND, +BS Ext: No C/C/E  Assessment/Plan: 1) Chronically dilated small bowel and colon.  This is not a new finding and there does not appear to be any association with a malignant cause.  She does have a history of SBO in the past, but this is not the case at this time.  Her last colonoscopy was negative for any malignant process.  Since she does not have any overt GI issues with her colon or small bowel, no further evaluation of this issue is required at this time. 2) Cough  I reviewed the Speech Path evaluation and I agree that she may be having some cough issues from GERD.  She needs to be on a PPI consistently.  Her last EGD with Dr. Loreta Ave was last year and there were not  abnormalities.  If necessary, she can follow up Dr. Loreta Ave upon discharge.  Call if you have any other questions. Mohmed Farver D 03/05/2011, 2:51 PM

## 2011-03-05 NOTE — Progress Notes (Signed)
Internal Medicine Attending  Date: 03/05/2011  Patient name: Rachel Bridges Medical record number: [id number] Date of birth: Feb 24, 1925 Age: 77 y.o. Gender: female  I saw and evaluated the patient. I reviewed the resident's note by Dr. Abner Greenspan and I agree with the resident's findings and plans as documented in his note.

## 2011-03-06 ENCOUNTER — Encounter: Payer: Medicare Other | Admitting: Internal Medicine

## 2011-03-06 ENCOUNTER — Inpatient Hospital Stay (HOSPITAL_COMMUNITY): Payer: Medicare Other

## 2011-03-06 LAB — BASIC METABOLIC PANEL
BUN: 23 mg/dL (ref 6–23)
Chloride: 121 mEq/L — ABNORMAL HIGH (ref 96–112)
Creatinine, Ser: 1.36 mg/dL — ABNORMAL HIGH (ref 0.50–1.10)
GFR calc Af Amer: 40 mL/min — ABNORMAL LOW (ref >90)
GFR calc non Af Amer: 35 mL/min — ABNORMAL LOW (ref >90)
Potassium: 4.9 mEq/L (ref 3.5–5.1)

## 2011-03-06 LAB — CBC
HCT: 27.8 % — ABNORMAL LOW (ref 36.0–46.0)
Hemoglobin: 8.8 g/dL — ABNORMAL LOW (ref 12.0–15.0)
MCHC: 31.7 g/dL (ref 30.0–36.0)
RDW: 15.8 % — ABNORMAL HIGH (ref 11.5–15.5)
WBC: 7.1 10*3/uL (ref 4.0–10.5)

## 2011-03-06 LAB — ACTH STIMULATION, 3 TIME POINTS
Cortisol, 30 Min: 9.7 ug/dL (ref >20)
Cortisol, 60 Min: 14.6 ug/dL (ref >20)

## 2011-03-06 LAB — T4, FREE: Free T4: 0.98 ng/dL (ref 0.80–1.80)

## 2011-03-06 MED ORDER — HYDROCORTISONE 10 MG PO TABS
10.0000 mg | ORAL_TABLET | Freq: Every day | ORAL | Status: DC
  Administered 2011-03-07 – 2011-03-08 (×2): 10 mg via ORAL
  Filled 2011-03-06 (×3): qty 1

## 2011-03-06 MED ORDER — FAMOTIDINE 40 MG PO TABS
40.0000 mg | ORAL_TABLET | Freq: Every day | ORAL | Status: DC
  Administered 2011-03-07 – 2011-03-08 (×2): 40 mg via ORAL
  Filled 2011-03-06 (×2): qty 1

## 2011-03-06 MED ORDER — HYDROCORTISONE 5 MG PO TABS
15.0000 mg | ORAL_TABLET | Freq: Every day | ORAL | Status: DC
  Administered 2011-03-07 – 2011-03-09 (×3): 15 mg via ORAL
  Filled 2011-03-06 (×4): qty 1

## 2011-03-06 NOTE — Progress Notes (Signed)
Subjective: Pt con't to eat well, had bm yesterday w/o incident.  Decent energy level.  Objective: Vital signs in last 24 hours: Filed Vitals:   03/05/11 0548 03/05/11 1450 03/05/11 2100 03/06/11 0555  BP: 142/70 88/54 102/61 145/82  Pulse: 62 68 68 70  Temp: 98.4 F (36.9 C) 98.4 F (36.9 C) 97.6 F (36.4 C) 98.2 F (36.8 C)  TempSrc: Oral     Resp: 18 18 18 18   Height:      Weight:      SpO2: 95% 96% 93% 96%   Weight change:   Intake/Output Summary (Last 24 hours) at 03/06/11 1359 Last data filed at 03/06/11 0900  Gross per 24 hour  Intake    603 ml  Output    250 ml  Net    353 ml   Physical Exam: GEN: NAD. Alert and oriented x 3. Pleasant, conversant, and cooperative to exam.  RESP: CTAB, no w/r/r  CARDIOVASCULAR: RRR, S1, S2, 2/6 HSM at R sternal border.  ABDOMEN: soft, NT, moderate distention in mid abdomen, NABS + occasional high pitched tinkling.  EXT: warm and dry. No edema in b/l LE  Lab Results: Basic Metabolic Panel:  Lab 03/06/11 8657 03/05/11 0530 03/02/11 0500  NA 148* 141 --  K 4.9 3.5 --  CL 121* 116* --  CO2 18* 17* --  GLUCOSE 96 93 --  BUN 23 20 --  CREATININE 1.36* 1.24* --  CALCIUM 9.0 8.6 --  MG -- -- 1.5  PHOS -- -- --   Liver Function Tests:  Lab 03/01/11 1433  AST 11  ALT 9  ALKPHOS 126*  BILITOT 0.2*  PROT 7.1  ALBUMIN 2.6*   CBC:  Lab 03/06/11 0650 03/05/11 0530 03/01/11 1433  WBC 7.1 7.7 --  NEUTROABS -- -- 5.1  HGB 8.8* 9.8* --  HCT 27.8* 30.4* --  MCV 81.8 80.6 --  PLT 223 231 --   Cardiac Enzymes: No results found for this basename: CKTOTAL:3,CKMB:3,CKMBINDEX:3,TROPONINI:3 in the last 168 hours BNP:  Lab 03/04/11 1120  POCBNP 1432.0*   Thyroid Function Tests:  Lab 03/01/11 1915  TSH 2.908  T4TOTAL --  FREET4 --  T3FREE --  THYROIDAB --   Coagulation: No results found for this basename: LABPROT:4,INR:4 in the last 168 hours Anemia Panel:  Lab 03/03/11 0555  VITAMINB12 190*  FOLATE 15.3    FERRITIN 67  TIBC 236*  IRON 22*  RETICCTPCT 1.1    Micro Results: Recent Results (from the past 240 hour(s))  CULTURE, SPUTUM-ASSESSMENT     Status: Normal   Collection Time   03/01/11  7:04 PM      Component Value Range Status Comment   Specimen Description SPUTUM   Final    Special Requests NONE   Final    Sputum evaluation     Final    Value: MICROSCOPIC FINDINGS SUGGEST THAT THIS SPECIMEN IS NOT REPRESENTATIVE OF LOWER RESPIRATORY SECRETIONS. PLEASE RECOLLECT.     CALLED TO S COLUMBRES RN 11.2.12 AT 1947 BY ROMEROJ   Report Status 03/01/2011 FINAL   Final   CULTURE, BLOOD (ROUTINE X 2)     Status: Normal (Preliminary result)   Collection Time   03/01/11  7:30 PM      Component Value Range Status Comment   Specimen Description BLOOD LEFT ARM   Final    Special Requests BOTTLES DRAWN AEROBIC AND ANAEROBIC [facility]   Final    Setup Time [id number]   Final  Culture     Final    Value:        BLOOD CULTURE RECEIVED NO GROWTH TO DATE CULTURE WILL BE HELD FOR 5 DAYS BEFORE ISSUING A FINAL NEGATIVE REPORT   Report Status PENDING   Incomplete   CULTURE, BLOOD (ROUTINE X 2)     Status: Normal (Preliminary result)   Collection Time   03/01/11  7:40 PM      Component Value Range Status Comment   Specimen Description BLOOD LEFT ARM   Final    Special Requests BOTTLES DRAWN AEROBIC AND ANAEROBIC 5CC EACH   Final    Setup Time [id number]   Final    Culture     Final    Value:        BLOOD CULTURE RECEIVED NO GROWTH TO DATE CULTURE WILL BE HELD FOR 5 DAYS BEFORE ISSUING A FINAL NEGATIVE REPORT   Report Status PENDING   Incomplete   CULTURE, SPUTUM-ASSESSMENT     Status: Normal   Collection Time   03/01/11  9:46 PM      Component Value Range Status Comment   Specimen Description SPUTUM   Final    Special Requests NONE   Final    Sputum evaluation     Final    Value: MICROSCOPIC FINDINGS SUGGEST THAT THIS SPECIMEN IS NOT REPRESENTATIVE OF LOWER RESPIRATORY SECRETIONS. PLEASE  RECOLLECT.     CALLED TO S.COLUMBRES,RN 2220 11/02.12 BY M.CAMPBELL   Report Status 03/01/2011 FINAL   Final   CULTURE, SPUTUM-ASSESSMENT     Status: Normal   Collection Time   03/02/11  8:08 PM      Component Value Range Status Comment   Specimen Description SPUTUM   Final    Special Requests NONE   Final    Sputum evaluation     Final    Value: MICROSCOPIC FINDINGS SUGGEST THAT THIS SPECIMEN IS NOT REPRESENTATIVE OF LOWER RESPIRATORY SECRETIONS. PLEASE RECOLLECT.     NOTIFIED Rolinda Roan 2122 11.3.12   Report Status 03/02/2011 FINAL   Final   CLOSTRIDIUM DIFFICILE BY PCR     Status: Normal   Collection Time   03/03/11  8:30 AM      Component Value Range Status Comment   C difficile by pcr NEGATIVE  NEGATIVE  Final    Studies/Results: Ct Abdomen Pelvis Wo Contrast  03/05/2011  *RADIOLOGY REPORT*  Clinical Data: Evaluate for possible adrenal pathology.  CT ABDOMEN AND PELVIS WITHOUT CONTRAST  Technique:  Multidetector CT imaging of the abdomen and pelvis was performed following the standard protocol without intravenous contrast.  Comparison: 10/17/2009, 08/03/2007.  Findings: Coronary artery calcification.  Trace pericardial fluid.  Intra-abdominal organ evaluation is limited without intravenous contrast.  Within this limitation, unremarkable liver, spleen, pancreas, right adrenal gland.  The left adrenal gland is nodular in configuration without a dominant nodule.  This is similar to the comparison exam.  Bilateral renal cysts are incompletely characterized however without significant interval change.  No hydronephrosis or hydroureter.  Colonic diverticulosis. Status post partial right colectomy. Markedly dilated bowel in the right hemiabdomen, similar to slightly increased from the comparison study. The hepatic flexure measures up to 8 cm. Thickened appearance to the ascending colon may be exaggerated by incomplete distension.  No pericolonic fat stranding.  The dilated bowel displaces the  right intra-abdominal contents  medially.  No free intraperitoneal air or fluid.  Thin-walled bladder.  Unremarkable uterus and adnexa. Enlarged mesenteric lymph nodes.  There is scattered atherosclerotic calcification  of the aorta and its branches. No aneurysmal dilatation.  Postsurgical changes L3-L5.  No acute osseous abnormality.  IMPRESSION: Marked proximal colonic dilatation without definite evidence for complete obstruction.  Nodular left adrenal gland without a dominant nodule.  This is nonspecific however unchanged from 2009.  Enlarged mesenteric lymph nodes, similar to the 2009 examination.  Original Report Authenticated By: Waneta Martins, M.D.   Ct Chest Wo Contrast  03/05/2011  *RADIOLOGY REPORT*  Clinical Data: Weight loss.  Cough.  Smoker.  Rule out lung mass.  CT CHEST WITHOUT CONTRAST  Technique:  Multidetector CT imaging of the chest was performed following the standard protocol without IV contrast.  Comparison: 03/02/2011 chest x-ray.  03/09/2008 chest CT.  Findings: Right middle lobe 7.9 mm nodule (series 3 image 32), left lower lobe 4.7 mm nodule (series 3 image 32) and minimal hazy parenchymal changes along the peripheral aspect of the left upper lobe (series 3 image 18) unchanged since prior exam.  New areas of minimal atelectasis/scarring posterior lungs bilaterally.  No interval development of worrisome pulmonary parenchymal lesion.  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  This can be followed with thyroid ultrasound.  Coronary artery calcifications.  Atherosclerotic type changes of the thoracic aorta with the ascending thoracic aorta measuring up to 3.7 cm.  No mediastinal, obvious hilar or axillary adenopathy.  Gas and fluid filled dilated colon extending below the hemidiaphragm.  This displaces and compresses the liver unchanged from prior exam.  Nodularity left adrenal gland and left renal cysts without significant change with the exception of slight  increase in size of left renal cyst.  No new bony destructive lesion.  Degenerative changes, postsurgical changes and hemangioma unchanged.  IMPRESSION: Stable chest parenchymal changes (as detailed above) without worrisome mass suspicious for pulmonary malignancy.  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  This can be followed with thyroid ultrasound.  Coronary artery calcifications.  Atherosclerotic type changes of the thoracic aorta with the ascending thoracic aorta measuring up to 3.7 cm.  Gas and fluid filled dilated colon extending below the hemidiaphragm.  This displaces and compresses the liver unchanged from prior exam.  Nodularity left adrenal gland and left renal cysts without significant change with the exception of slight increase in size of left renal cyst.  Original Report Authenticated By: Fuller Canada, M.D.   Medications: I have reviewed the patient's current medications. Scheduled Meds:   . aspirin EC  81 mg Oral Daily  . famotidine  40 mg Oral Daily  . feeding supplement  1 Container Oral TID WC  . ferrous fumarate  1 tablet Oral TID  . sertraline  25 mg Oral QHS  . DISCONTD: famotidine  40 mg Oral BID  . DISCONTD: megestrol  800 mg Oral Daily   Continuous Infusions:   . sodium chloride 20 mL/hr at 03/04/11 2108   PRN Meds:.guaiFENesin-codeine, ondansetron (ZOFRAN) IV  Assessment/Plan: # Fatigue: Likely multifactorial. Currently investigating her anemia, diarrhea/GI probs, and question of malignancy. PT evaluated and will follow while she is in the hospital and recommended HHPT on discharge. Also component of poss adrenal insuff we will w/u.  - see individual probs below for w/u   # Low Cortisol: Given low a.m. And low ACTH, conern for adrenal insufficiency, which could explain fatigue. Oral contast only abdominal CT did not reveal any adrenal masses. Megace can cause adrenal suppresion, so we will d/c.  Consulted with Dr. Sharl Ma of endocrine.  Will  supplement with hydrocortisone based on BSA calculation, get MRI to r/o large masses, and con't to w/u.  ACTH stim test somewhat equivocal. - ACTH, renin, aldosterone, levels pending  - MRI brain w/o contrast (low GFR) - start hydrocortisone 1mg /15m^2 of BSA, 2/3 of dose b/w 6-8 a.m., 1/3 of dose b/w 4-5 pm. - awaiting height/weight to start replacement - f/u with Dr. Sharl Ma on 11/16 at 2pm at his office  # Normocytic anemia: Anemia panel indicates component of Fe Def. Likely component from CKD as well.  Hb today lowest on admission, concern for some blood loss given bleeding two nights ago with BM.  No further bleeding since, will con't to monitor. - trend CBC - Fe tx    # Colonic enlargement: CT shows nearly 8cm dilated colon, no definite obstruction. Had stool yesterday. Minimal pain, no N/V. Pt c extensive GI h/x including hemicolectomy and various obstructions over the years. Last colonoscopy in 2009 was negative for malignancy, showed diverticulosis. GI consult recommended no acute intervention, as prob is likely chronic. - con't to monitor   # Acute on chronic renal failure: Creatinine is near her baseline. Renal u/s unremarkable. CKD has never been worked up in the past.  She has underlying Stage III CKD which is likely the cause of her anemia. She has no outpatient nephrologist so we will likely have to refer her at her follow up.  - f/u labs - nephrology consult as outpatient  # Adult failure to thrive: She has not been eating and drinking well at home. She has also lost about 25 lbs as evidenced in the outpatient record over the last year or so. D/cing megace given possible relation to adrenal insufficiency - per Nutrition consult, starting breeze supplement - d/c megace  # Cough: Likely secondary to her dysphagia. Swallow study yesterday was likely secondary to esophogeal dysmotility and they suggested better compliance with her PPI/H2 blocker therapy.  - con't protonix  - dinner  more at 6 or 7 (said she sometimes eats at 8pm at home)   #Ppx Lovenox   #Dispo  HHOT   LOS: 5 days   [facility] [place], BEN 03/06/2011, 1:59 PM

## 2011-03-06 NOTE — Clinical Documentation Improvement (Signed)
MALNUTRITION DOCUMENTATION CLARIFICATION Please update your documentation within the medical record to reflect your response to this query.                                                                                         03/06/11    Dear Dr.Joines and/ or  Associates,  In a better effort to capture your patient's severity of illness, reflect appropriate length of stay and utilization of resources, a review of the patient medical record has revealed the following indicators.    In responding to this query please exercise your independent judgment.  The fact that a query is asked, does not imply that any particular answer is desired or expected.  You may use possible, probable, or suspect with inpatient documentation. possible, probable, suspected diagnoses MUST be documented at the time of discharge  Possible Clinical Conditions?  _______Moderate Malnutrition  _______Severe Malnutrition    _______Protein Calorie Malnutrition  _______Severe Protein Calorie Malnutrition  _______Other Condition________________  _______Cannot clinically determine     Supporting Information: Risk Factors:                "FTT", "Poor appetite", "Nausea", "25lb wt loss x 1 yr"  Ht 67in    Wt 55.4kg  BMI:  19  Weight  Loss:  25lbs x 72yr  Diagnostics: Albumin level:     2.6 on 11/02 Total Protein:      7.1 on 11/02 Calcium level:     9.0 on 11/7  Treatment  Enteral Feeding         Resource Breeze supplement po TID  Medications                Megace 40mg  po daily  Nutrition Consult         Done on 11/5      Reviewed:   Thank You,  Sincerely, Vivia Budge RN. BSN  Clinical Documentation Specialist: Pager  [phone]  Health Information Management [place]

## 2011-03-07 DIAGNOSIS — R5381 Other malaise: Secondary | ICD-10-CM

## 2011-03-07 DIAGNOSIS — N179 Acute kidney failure, unspecified: Secondary | ICD-10-CM

## 2011-03-07 DIAGNOSIS — D649 Anemia, unspecified: Secondary | ICD-10-CM

## 2011-03-07 DIAGNOSIS — R5383 Other fatigue: Secondary | ICD-10-CM

## 2011-03-07 DIAGNOSIS — R627 Adult failure to thrive: Secondary | ICD-10-CM

## 2011-03-07 LAB — CBC
MCHC: 32.8 g/dL (ref 30.0–36.0)
RDW: 15.7 % — ABNORMAL HIGH (ref 11.5–15.5)
WBC: 8.1 10*3/uL (ref 4.0–10.5)

## 2011-03-07 LAB — BASIC METABOLIC PANEL
Chloride: 113 mEq/L — ABNORMAL HIGH (ref 96–112)
GFR calc Af Amer: 44 mL/min — ABNORMAL LOW (ref >90)
GFR calc non Af Amer: 38 mL/min — ABNORMAL LOW (ref >90)
Potassium: 3.6 mEq/L (ref 3.5–5.1)
Sodium: 141 mEq/L (ref 135–145)

## 2011-03-07 LAB — CEA: CEA: 1.7 ng/mL (ref 0.0–5.0)

## 2011-03-07 MED ORDER — DOCUSATE SODIUM 100 MG PO CAPS
100.0000 mg | ORAL_CAPSULE | Freq: Two times a day (BID) | ORAL | Status: DC
  Filled 2011-03-07 (×6): qty 1

## 2011-03-07 MED ORDER — CYANOCOBALAMIN 1000 MCG/ML IJ SOLN
1000.0000 ug | Freq: Once | INTRAMUSCULAR | Status: AC
  Administered 2011-03-07: 1000 ug via INTRAMUSCULAR
  Filled 2011-03-07: qty 1

## 2011-03-07 NOTE — Progress Notes (Signed)
Occupational Therapy Evaluation Patient Details Name: HALLA CHOPP MRN: [id number] DOB: 24-Jan-1925 Today's Date: 03/07/2011  OT Assessment/Plan OT Assessment/Plan OT Plan: Discharge plan remains appropriate OT Frequency: Min 2X/week Follow Up Recommendations: Home health OT OT Goals Acute Rehab OT Goals OT Goal Formulation: With patient  OT Treatment Precautions/Restrictions  Precautions Precautions: Fall   ADL ADL Toilet Transfer: Buyer, retail Details (indicate cue type and reason): Mod verbal cues for hand placement with RW and simulated toilet transfer Toilet Transfer Method: Ambulating Equipment Used: Rolling walker Mobility  Bed Mobility Supine to Sit: 5: Supervision;HOB elevated (Comment degrees);Other (comment);With rails (HOB 41 degrees) Sitting - Scoot to Edge of Bed: 5: Supervision Transfers Sit to Stand: 5: Supervision Sit to Stand Details (indicate cue type and reason): min verbal cues for hand placement with RW  Stand to Sit: 5: Supervision Stand to Sit Details: min verbal cues for hand placement with RW Exercises    End of Session OT - End of Session Equipment Utilized During Treatment: Gait belt Activity Tolerance: Patient tolerated treatment well Patient left: in chair;with call bell in reach Nurse Communication: Other (comment) (notified Shinita that pt. in chair @ end of session) General Behavior During Session: [facility] for tasks performed Cognition: [facility] for tasks performed  Peyton Najjar Beatrice-Mitchell  03/07/2011, 2:24 PM

## 2011-03-07 NOTE — Progress Notes (Signed)
Physical Therapy Treatment Patient Details Name: Rachel Bridges MRN: [id number] DOB: 10-May-1924 Today's Date: 03/07/2011  PT Assessment/Plan  PT - Assessment/Plan Comments on Treatment Session: Pt doing well, a little unmotivated at times and likes to stay in the bed. Pt encouraged to participate in as much mobility as possible and get up to her chair for all meals with nursing.  PT Plan: Discharge plan remains appropriate PT Frequency: Min 3X/week Follow Up Recommendations: Home health PT Equipment Recommended: None recommended by PT PT Goals  Acute Rehab PT Goals PT Goal Formulation: With patient Time For Goal Achievement: 2 weeks Pt will Roll Supine to Right Side: Independently PT Goal: Rolling Supine to Right Side - Progress: Progressing toward goal Pt will Roll Supine to Left Side: Independently PT Goal: Rolling Supine to Left Side - Progress: Progressing toward goal Pt will go Supine/Side to Sit: Independently PT Goal: Supine/Side to Sit - Progress: Progressing toward goal Pt will Transfer Sit to Stand/Stand to Sit: with modified independence PT Transfer Goal: Sit to Stand/Stand to Sit - Progress: Progressing toward goal Pt will Transfer Bed to Chair/Chair to Bed: with modified independence PT Transfer Goal: Bed to Chair/Chair to Bed - Progress: Progressing toward goal Pt will Ambulate: with supervision;with rolling walker;>150 feet PT Goal: Ambulate - Progress: Progressing toward goal  PT Treatment Precautions/Restrictions  Precautions Precautions: Fall Restrictions Weight Bearing Restrictions: No Mobility (including Balance) Bed Mobility Bed Mobility: Yes Supine to Sit: 5: Supervision;HOB elevated (Comment degrees);Other (comment);With rails (HOB 41 degrees) Sitting - Scoot to Edge of Bed: 5: Supervision Sit to Supine - Left: 5: Supervision Sit to Supine - Left Details (indicate cue type and reason): Cues for efficiency, pt with difficulty with bed mobility secondary  to weakness.  Transfers Transfers: Yes Sit to Stand: 5: Supervision Sit to Stand Details (indicate cue type and reason): Performed 2 x. Cues for scooting edge of chair for better positioning.  Stand to Sit: 5: Supervision Stand to Sit Details: cues to square up with seating surface Stand Pivot Transfers: 5: Supervision Stand Pivot Transfer Details (indicate cue type and reason): Verbal cues for positioning of self and sequencing.  Ambulation/Gait Ambulation/Gait: Yes Ambulation/Gait Assistance: 5: Supervision Ambulation/Gait Assistance Details (indicate cue type and reason): Repeated verbal/tactile cues for more upright posture and hip extension.  Ambulation Distance (Feet): 175 Feet Assistive device: Rolling walker Gait Pattern: Trunk flexed Stairs: No    Exercise  General Exercises - Lower Extremity Long Arc Quad: AROM;Strengthening;Both;20 reps;Seated (manually resisted) Heel Slides: AROM;Strengthening;Both;10 reps;Other reps (comment) (manually resisted for hamstring strenghtening) Hip Flexion/Marching: AROM;Strengthening;Both;10 reps;Seated End of Session PT - End of Session Equipment Utilized During Treatment: Gait belt Activity Tolerance: Patient tolerated treatment well Patient left: in bed (and on bed pan) Nurse Communication: Mobility status for transfers;Mobility status for ambulation (pt should get up to chair  for supper) General Behavior During Session: [facility] for tasks performed Cognition: [facility] for tasks performed  Cheek, [name] Mains), [name] 03/07/2011, 3:23 PM

## 2011-03-07 NOTE — Progress Notes (Signed)
Met with pt and daughter, list of [facility] providers given and [facility] selected for HHPT. [facility] notified. Sabine Tenenbaum RN MPH........Marland KitchenCase Manager

## 2011-03-07 NOTE — Plan of Care (Signed)
Problem: Phase II Progression Outcomes Goal: Discharge plan established Outcome: Progressing Patient is to be discharge home with home health

## 2011-03-07 NOTE — Progress Notes (Signed)
Internal Medicine Attending  Date: 03/07/2011  Patient name: Rachel Bridges Medical record number: [id number] Date of birth: 1925-04-24 Age: 77 y.o. Gender: female  I saw and evaluated the patient. I reviewed the resident's note by Dr. Abner Greenspan and I agree with the resident's findings and plans as documented in his note.

## 2011-03-07 NOTE — Progress Notes (Signed)
Subjective: Pt feels well, got first dose hydrocortisone today.  Objective: Vital signs in last 24 hours: Filed Vitals:   03/06/11 1429 03/06/11 1454 03/06/11 2100 03/07/11 0500  BP:  107/67 113/75 121/73  Pulse:  74 80 66  Temp:  97.3 F (36.3 C) 98.4 F (36.9 C) 98.6 F (37 C)  TempSrc:  Oral Oral Oral  Resp:  18 20 18   Height: 5\' 7"  (1.702 m)     Weight: 126 lb 12.2 oz (57.5 kg)     SpO2:  97% 96% 95%   Weight change:   Intake/Output Summary (Last 24 hours) at 03/07/11 0847 Last data filed at 03/07/11 0648  Gross per 24 hour  Intake    418 ml  Output    302 ml  Net    116 ml   Physical Exam: Physical Exam GEN: NAD.  Alert and oriented x 3.  Pleasant, conversant, and cooperative to exam. RESP:  CTAB, no w/r/r CARDIOVASCULAR: RRR, S1, S2, no m/r/g ABDOMEN: soft, slight TTP in lower abdomen, mild distension (stable) EXT: warm and dry. No edema in b/l LE  Lab Results: Basic Metabolic Panel:  Lab 03/07/11 8119 03/06/11 0650 03/02/11 0500  NA 141 148* --  K 3.6 4.9 --  CL 113* 121* --  CO2 19 18* --  GLUCOSE 92 96 --  BUN 28* 23 --  CREATININE 1.26* 1.36* --  CALCIUM 8.7 9.0 --  MG -- -- 1.5  PHOS -- -- --   Liver Function Tests:  Lab 03/01/11 1433  AST 11  ALT 9  ALKPHOS 126*  BILITOT 0.2*  PROT 7.1  ALBUMIN 2.6*   No results found for this basename: LIPASE:2,AMYLASE:2 in the last 168 hours No results found for this basename: AMMONIA:2 in the last 168 hours CBC:  Lab 03/07/11 0510 03/06/11 0650 03/01/11 1433  WBC 8.1 7.1 --  NEUTROABS -- -- 5.1  HGB 9.7* 8.8* --  HCT 29.6* 27.8* --  MCV 80.9 81.8 --  PLT 229 223 --   Cardiac Enzymes: No results found for this basename: CKTOTAL:3,CKMB:3,CKMBINDEX:3,TROPONINI:3 in the last 168 hours BNP:  Lab 03/04/11 1120  POCBNP 1432.0*   Thyroid Function Tests:  Lab 03/06/11 1354 03/01/11 1915  TSH -- 2.908  T4TOTAL -- --  FREET4 0.98 --  T3FREE -- --  THYROIDAB -- --   Anemia Panel:  Lab  03/03/11 0555  VITAMINB12 190*  FOLATE 15.3  FERRITIN 67  TIBC 236*  IRON 22*  RETICCTPCT 1.1    Micro Results: Recent Results (from the past 240 hour(s))  CULTURE, SPUTUM-ASSESSMENT     Status: Normal   Collection Time   03/01/11  7:04 PM      Component Value Range Status Comment   Specimen Description SPUTUM   Final    Special Requests NONE   Final    Sputum evaluation     Final    Value: MICROSCOPIC FINDINGS SUGGEST THAT THIS SPECIMEN IS NOT REPRESENTATIVE OF LOWER RESPIRATORY SECRETIONS. PLEASE RECOLLECT.     CALLED TO S COLUMBRES RN 11.2.12 AT 1947 BY ROMEROJ   Report Status 03/01/2011 FINAL   Final   CULTURE, BLOOD (ROUTINE X 2)     Status: Normal (Preliminary result)   Collection Time   03/01/11  7:30 PM      Component Value Range Status Comment   Specimen Description BLOOD LEFT ARM   Final    Special Requests BOTTLES DRAWN AEROBIC AND ANAEROBIC [facility] EACH   Final  Setup Time [id number]   Final    Culture     Final    Value:        BLOOD CULTURE RECEIVED NO GROWTH TO DATE CULTURE WILL BE HELD FOR 5 DAYS BEFORE ISSUING A FINAL NEGATIVE REPORT   Report Status PENDING   Incomplete   CULTURE, BLOOD (ROUTINE X 2)     Status: Normal (Preliminary result)   Collection Time   03/01/11  7:40 PM      Component Value Range Status Comment   Specimen Description BLOOD LEFT ARM   Final    Special Requests BOTTLES DRAWN AEROBIC AND ANAEROBIC 5CC EACH   Final    Setup Time [id number]   Final    Culture     Final    Value:        BLOOD CULTURE RECEIVED NO GROWTH TO DATE CULTURE WILL BE HELD FOR 5 DAYS BEFORE ISSUING A FINAL NEGATIVE REPORT   Report Status PENDING   Incomplete   CULTURE, SPUTUM-ASSESSMENT     Status: Normal   Collection Time   03/01/11  9:46 PM      Component Value Range Status Comment   Specimen Description SPUTUM   Final    Special Requests NONE   Final    Sputum evaluation     Final    Value: MICROSCOPIC FINDINGS SUGGEST THAT THIS SPECIMEN IS NOT REPRESENTATIVE  OF LOWER RESPIRATORY SECRETIONS. PLEASE RECOLLECT.     CALLED TO S.COLUMBRES,RN 2220 11/02.12 BY M.CAMPBELL   Report Status 03/01/2011 FINAL   Final   CULTURE, SPUTUM-ASSESSMENT     Status: Normal   Collection Time   03/02/11  8:08 PM      Component Value Range Status Comment   Specimen Description SPUTUM   Final    Special Requests NONE   Final    Sputum evaluation     Final    Value: MICROSCOPIC FINDINGS SUGGEST THAT THIS SPECIMEN IS NOT REPRESENTATIVE OF LOWER RESPIRATORY SECRETIONS. PLEASE RECOLLECT.     NOTIFIED Rolinda Roan 2122 11.3.12   Report Status 03/02/2011 FINAL   Final   CLOSTRIDIUM DIFFICILE BY PCR     Status: Normal   Collection Time   03/03/11  8:30 AM      Component Value Range Status Comment   C difficile by pcr NEGATIVE  NEGATIVE  Final    Studies/Results: Ct Chest Wo Contrast  03/05/2011  *RADIOLOGY REPORT*  Clinical Data: Weight loss.  Cough.  Smoker.  Rule out lung mass.  CT CHEST WITHOUT CONTRAST  Technique:  Multidetector CT imaging of the chest was performed following the standard protocol without IV contrast.  Comparison: 03/02/2011 chest x-ray.  03/09/2008 chest CT.  Findings: Right middle lobe 7.9 mm nodule (series 3 image 32), left lower lobe 4.7 mm nodule (series 3 image 32) and minimal hazy parenchymal changes along the peripheral aspect of the left upper lobe (series 3 image 18) unchanged since prior exam.  New areas of minimal atelectasis/scarring posterior lungs bilaterally.  No interval development of worrisome pulmonary parenchymal lesion.  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  This can be followed with thyroid ultrasound.  Coronary artery calcifications.  Atherosclerotic type changes of the thoracic aorta with the ascending thoracic aorta measuring up to 3.7 cm.  No mediastinal, obvious hilar or axillary adenopathy.  Gas and fluid filled dilated colon extending below the hemidiaphragm.  This displaces and compresses the liver unchanged  from prior exam.  Nodularity left  adrenal gland and left renal cysts without significant change with the exception of slight increase in size of left renal cyst.  No new bony destructive lesion.  Degenerative changes, postsurgical changes and hemangioma unchanged.  IMPRESSION: Stable chest parenchymal changes (as detailed above) without worrisome mass suspicious for pulmonary malignancy.  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  This can be followed with thyroid ultrasound.  Coronary artery calcifications.  Atherosclerotic type changes of the thoracic aorta with the ascending thoracic aorta measuring up to 3.7 cm.  Gas and fluid filled dilated colon extending below the hemidiaphragm.  This displaces and compresses the liver unchanged from prior exam.  Nodularity left adrenal gland and left renal cysts without significant change with the exception of slight increase in size of left renal cyst.  Original Report Authenticated By: Fuller Canada, M.D.   Mr Brain Wo Contrast  03/06/2011  *RADIOLOGY REPORT*  Clinical Data: Altered mental status.  MRI HEAD WITHOUT CONTRAST  Technique:  Multiplanar, multiecho pulse sequences of the brain and surrounding structures were obtained according to standard protocol without intravenous contrast.  Comparison: 01/18/2008 CT.  No comparison MR.  Findings: No acute infarct.  Scattered nonspecific calvarial lesions as noted on prior CT. Etiology/significance indeterminate.  Otherwise no evidence of intracranial mass lesion noted on this unenhanced exam.  No intracranial hemorrhage.  Mild to moderate small vessel disease type changes.  Atrophy slightly disproportionate and most notable in the parietal lobes and cerebellum.  Ventricular prominence unchanged and may be related to atrophy rather hydrocephalus.  Poor delineation of the right vertebral artery which may end in a PICA distribution.  The remainder of the major intracranial vascular structures are  patent.  Mild polypoid mucosal thickening left maxillary sinus.  Partially empty sella incidentally noted.  IMPRESSION: No acute infarct.  No intracranial hemorrhage.  Mild to moderate small vessel disease type changes.  Atrophy as discussed above.  Scattered nonspecific calvarial lesions as noted on prior CT. Etiology indeterminate.  Original Report Authenticated By: Fuller Canada, M.D.   Medications: I have reviewed the patient's current medications. Scheduled Meds:   . aspirin EC  81 mg Oral Daily  . famotidine  40 mg Oral Daily  . feeding supplement  1 Container Oral TID WC  . ferrous fumarate  1 tablet Oral TID  . hydrocortisone  15 mg Oral Daily   And  . hydrocortisone  10 mg Oral Daily  . sertraline  25 mg Oral QHS  . DISCONTD: famotidine  40 mg Oral BID  . DISCONTD: megestrol  800 mg Oral Daily   Continuous Infusions:   . sodium chloride 20 mL/hr at 03/04/11 2108   PRN Meds:.guaiFENesin-codeine, ondansetron (ZOFRAN) IV Assessment/Plan: # Fatigue: Likely multifactorial. Currently investigating her anemia, diarrhea/GI probs, and question of malignancy. PT evaluated and will follow while she is in the hospital and recommended HHPT on discharge. Also component of poss adrenal insuff we will w/u.  - see individual probs below for w/u   # Low Cortisol: Consulted with Dr Sharl Ma of Endocrinology.  Will begin hydrocortisone replacement.  He will f/u pt next week in his office.  MRI shows partially empty sella: d/w radiologist, likely normal for 77 years old.  Megace is additional explanation, have d/c'ed.  Unclear how long will need supplementation given megace effects. - ACTH, renin, aldosterone, levels pending  - start hydrocortisone 1mg /15m^2 of BSA, 2/3 of dose b/w 6-8 a.m., 1/3 of dose b/w 4-5 pm.  - f/u  with Dr. Sharl Ma on 11/16 at 2pm at his office   # Normocytic anemia: Anemia panel indicates component of Fe Def. Likely component from CKD as well. B12 also low.  Hb today lowest on  admission, concern for some blood loss given bleeding two nights ago with BM. No further bleeding since, will con't to monitor.  Hb stable overnight. - trend CBC  - Fe tx  - B12 shot  # Colonic enlargement: CT shows nearly 8cm dilated colon, no definite obstruction. Has had stools. Minimal pain, no N/V.GI consult recommended no acute intervention, as prob is likely chronic. Some slight pain today, very minimal.  Will con't to evaluate, but no further action at this time. - con't to monitor   # Acute on chronic renal failure: Creatinine is near her baseline. Renal u/s unremarkable. CKD has never been worked up in the past. She has underlying Stage III CKD which is likely the cause of her anemia. She has no outpatient nephrologist so we will likely have to refer her at her follow up.  - f/u labs  - nephrology consult as outpatient  # Adult failure to thrive: She has not been eating and drinking well at home. She has also lost about 25 lbs as evidenced in the outpatient record over the last year or so. D/cing megace given possible relation to adrenal insufficiency.  So far all malignancy w/u has been negative (CT chest, CT abd, CEA, MRI brain) - per Nutrition consult, starting breeze supplement - d/c megace   # Cough: Likely secondary to her dysphagia. Swallow study yesterday was likely secondary to esophogeal dysmotility and they suggested better compliance with her PPI/H2 blocker therapy.  - con't protonix  - dinner more at 6 or 7 (said she sometimes eats at 8pm at home)   #Ppx Lovenox   #Dispo  HHOT   LOS: 6 days   [facility], BEN 03/07/2011, 8:47 AM

## 2011-03-08 LAB — CULTURE, BLOOD (ROUTINE X 2): Culture  Setup Time: 201211030117

## 2011-03-08 LAB — CBC
Hemoglobin: 10 g/dL — ABNORMAL LOW (ref 12.0–15.0)
MCH: 26.3 pg (ref 26.0–34.0)
MCHC: 32.1 g/dL (ref 30.0–36.0)
Platelets: 233 10*3/uL (ref 150–400)

## 2011-03-08 NOTE — Progress Notes (Signed)
Internal Medicine Attending  Date: 03/08/2011  Patient name: Rachel Bridges Medical record number: [id number] Date of birth: 1924/12/02 Age: 77 y.o. Gender: female  I saw and evaluated the patient. I reviewed the resident's note by Dr. Abner Greenspan and I agree with the resident's findings and plans as documented in his note.

## 2011-03-08 NOTE — Progress Notes (Signed)
Physical Therapy Treatment Patient Details Name: Rachel Bridges MRN: [id number] DOB: 1924-09-01 Today's Date: 03/08/2011  PT Assessment/Plan  PT - Assessment/Plan Comments on Treatment Session: PT attempted treatment twice today - pt requires significant encouragement for mobility. 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  PT Plan: Discharge plan remains appropriate PT Frequency: Min 3X/week Follow Up Recommendations: Home health PT Equipment Recommended: None recommended by PT PT Goals  Acute Rehab PT Goals PT Goal Formulation: With patient Time For Goal Achievement: 2 weeks Pt will Roll Supine to Right Side: Independently PT Goal: Rolling Supine to Right Side - Progress: Progressing toward goal Pt will Roll Supine to Left Side: Independently PT Goal: Rolling Supine to Left Side - Progress: Progressing toward goal Pt will go Supine/Side to Sit: Independently PT Goal: Supine/Side to Sit - Progress: Progressing toward goal Pt will Transfer Sit to Stand/Stand to Sit: with modified independence PT Transfer Goal: Sit to Stand/Stand to Sit - Progress: Progressing toward goal Pt will Transfer Bed to Chair/Chair to Bed: with modified independence PT Transfer Goal: Bed to Chair/Chair to Bed - Progress: Progressing toward goal Pt will Ambulate: with supervision;with rolling walker;>150 feet PT Goal: Ambulate - Progress: Met  PT Treatment Precautions/Restrictions  Precautions Precautions: Fall Restrictions Weight Bearing Restrictions: No Mobility (including Balance) Bed Mobility Bed Mobility: Yes Rolling Right: 5: Supervision Rolling Right Details (indicate cue type and reason): Verbal cues for efficiency Right Sidelying to Sit: 5: Supervision Right Sidelying to Sit Details (indicate cue type and reason): Verbal cues for efficiency.  Sit to Supine - Left: 5: Supervision Transfers Transfers: Yes Sit to Stand: 5: Supervision Sit to Stand Details (indicate cue type  and reason): Verbal cues for placement of UEs Stand to Sit: 5: Supervision Stand to Sit Details: for safety Ambulation/Gait Ambulation/Gait: Yes Ambulation/Gait Assistance: 5: Supervision Ambulation/Gait Assistance Details (indicate cue type and reason): Verbal cues for hip extension/upright posture. Ambulation Distance (Feet): 180 Feet Assistive device: Rolling walker Gait Pattern: Trunk flexed Gait velocity: slow gait, unable to increase speed with cues Stairs: No    Exercise  Other Exercises Other Exercises: Not performed secondary pt refusal End of Session PT - End of Session Equipment Utilized During Treatment: Gait belt Activity Tolerance: Patient tolerated treatment well (Pt self-limiting) Patient left: in chair (with LOTS of negotiation) General Behavior During Session: [facility] for tasks performed Cognition: [facility] for tasks performed  Cheek, [name] Mains, [name]) 03/08/2011, 3:04 PM

## 2011-03-08 NOTE — Progress Notes (Signed)
Subjective: Pt feels improved, with better energy levels than at admission.  This could be a reflection of hydrocortisone, or could be simple eating well and getting healthy.  Objective: Vital signs in last 24 hours: Filed Vitals:   03/07/11 1400 03/07/11 1500 03/07/11 2127 03/08/11 0541  BP: 107/68  147/82 147/84  Pulse: 61  68 61  Temp: 97.6 F (36.4 C)  97.7 F (36.5 C) 97.6 F (36.4 C)  TempSrc: Oral  Oral Oral  Resp: 17  18 18   Height:      Weight:      SpO2: 95% 99% 97% 97%   Weight change:   Intake/Output Summary (Last 24 hours) at 03/08/11 1102 Last data filed at 03/08/11 0912  Gross per 24 hour  Intake    780 ml  Output    700 ml  Net     80 ml   Physical Exam: GEN: NAD. Alert and oriented x 3. Pleasant, conversant, and cooperative to exam.  RESP: CTAB, no w/r/r  CARDIOVASCULAR: RRR, S1, S2, 2/6 HSM @ RUSB ABDOMEN: soft, slight TTP in lower abdomen, mild distension (stable)  EXT: warm and dry. No edema in b/l LE  Lab Results: Basic Metabolic Panel:  Lab 03/07/11 1610 03/06/11 0650 03/02/11 0500  NA 141 148* --  K 3.6 4.9 --  CL 113* 121* --  CO2 19 18* --  GLUCOSE 92 96 --  BUN 28* 23 --  CREATININE 1.26* 1.36* --  CALCIUM 8.7 9.0 --  MG -- -- 1.5  PHOS -- -- --   Liver Function Tests:  Lab 03/01/11 1433  AST 11  ALT 9  ALKPHOS 126*  BILITOT 0.2*  PROT 7.1  ALBUMIN 2.6*   No results found for this basename: LIPASE:2,AMYLASE:2 in the last 168 hours No results found for this basename: AMMONIA:2 in the last 168 hours CBC:  Lab 03/08/11 0545 03/07/11 0510 03/01/11 1433  WBC 9.1 8.1 --  NEUTROABS -- -- 5.1  HGB 10.0* 9.7* --  HCT 31.2* 29.6* --  MCV 82.1 80.9 --  PLT 233 229 --   Cardiac Enzymes: No results found for this basename: CKTOTAL:3,CKMB:3,CKMBINDEX:3,TROPONINI:3 in the last 168 hours BNP:  Lab 03/04/11 1120  POCBNP 1432.0*   D-Dimer: No results found for this basename: DDIMER:2 in the last 168 hours CBG: No results found  for this basename: GLUCAP:6 in the last 168 hours Hemoglobin A1C: No results found for this basename: HGBA1C in the last 168 hours Fasting Lipid Panel: No results found for this basename: CHOL,HDL,LDLCALC,TRIG,CHOLHDL,LDLDIRECT in the last 960 hours Thyroid Function Tests:  Lab 03/06/11 1354 03/01/11 1915  TSH -- 2.908  T4TOTAL -- --  FREET4 0.98 --  T3FREE -- --  THYROIDAB -- --   Coagulation: No results found for this basename: LABPROT:4,INR:4 in the last 168 hours Anemia Panel:  Lab 03/03/11 0555  VITAMINB12 190*  FOLATE 15.3  FERRITIN 67  TIBC 236*  IRON 22*  RETICCTPCT 1.1     Micro Results: Recent Results (from the past 240 hour(s))  CULTURE, SPUTUM-ASSESSMENT     Status: Normal   Collection Time   03/01/11  7:04 PM      Component Value Range Status Comment   Specimen Description SPUTUM   Final    Special Requests NONE   Final    Sputum evaluation     Final    Value: MICROSCOPIC FINDINGS SUGGEST THAT THIS SPECIMEN IS NOT REPRESENTATIVE OF LOWER RESPIRATORY SECRETIONS. PLEASE RECOLLECT.  CALLED TO S COLUMBRES RN 11.2.12 AT 1947 BY ROMEROJ   Report Status 03/01/2011 FINAL   Final   CULTURE, BLOOD (ROUTINE X 2)     Status: Normal   Collection Time   03/01/11  7:30 PM      Component Value Range Status Comment   Specimen Description BLOOD LEFT ARM   Final    Special Requests BOTTLES DRAWN AEROBIC AND ANAEROBIC [facility] EACH   Final    Setup Time [id number]   Final    Culture NO GROWTH 5 DAYS   Final    Report Status 03/08/2011 FINAL   Final   CULTURE, BLOOD (ROUTINE X 2)     Status: Normal   Collection Time   03/01/11  7:40 PM      Component Value Range Status Comment   Specimen Description BLOOD LEFT ARM   Final    Special Requests BOTTLES DRAWN AEROBIC AND ANAEROBIC [facility] EACH   Final    Setup Time [id number]   Final    Culture NO GROWTH 5 DAYS   Final    Report Status 03/08/2011 FINAL   Final   CULTURE, SPUTUM-ASSESSMENT     Status: Normal   Collection Time    03/01/11  9:46 PM      Component Value Range Status Comment   Specimen Description SPUTUM   Final    Special Requests NONE   Final    Sputum evaluation     Final    Value: MICROSCOPIC FINDINGS SUGGEST THAT THIS SPECIMEN IS NOT REPRESENTATIVE OF LOWER RESPIRATORY SECRETIONS. PLEASE RECOLLECT.     CALLED TO S.COLUMBRES,RN 2220 11/02.12 BY M.CAMPBELL   Report Status 03/01/2011 FINAL   Final   CULTURE, SPUTUM-ASSESSMENT     Status: Normal   Collection Time   03/02/11  8:08 PM      Component Value Range Status Comment   Specimen Description SPUTUM   Final    Special Requests NONE   Final    Sputum evaluation     Final    Value: MICROSCOPIC FINDINGS SUGGEST THAT THIS SPECIMEN IS NOT REPRESENTATIVE OF LOWER RESPIRATORY SECRETIONS. PLEASE RECOLLECT.     NOTIFIED Rolinda Roan 2122 11.3.12   Report Status 03/02/2011 FINAL   Final   CLOSTRIDIUM DIFFICILE BY PCR     Status: Normal   Collection Time   03/03/11  8:30 AM      Component Value Range Status Comment   C difficile by pcr NEGATIVE  NEGATIVE  Final    Studies/Results: Mr Brain Wo Contrast  03/07/2011  **ADDENDUM** CREATED: 03/07/2011 17:49:17  The clinical history initially provided was incorrect.  Clinical history is adrenal insufficiency.  Rule out mass.  Small pituitary gland.  Pituitary protocol was not performed.  **END ADDENDUM** SIGNED BY: Almedia Balls. Constance Goltz, M.D.   03/07/2011  *RADIOLOGY REPORT*  Clinical Data: Altered mental status.  MRI HEAD WITHOUT CONTRAST  Technique:  Multiplanar, multiecho pulse sequences of the brain and surrounding structures were obtained according to standard protocol without intravenous contrast.  Comparison: 01/18/2008 CT.  No comparison MR.  Findings: No acute infarct.  Scattered nonspecific calvarial lesions as noted on prior CT. Etiology/significance indeterminate.  Otherwise no evidence of intracranial mass lesion noted on this unenhanced exam.  No intracranial hemorrhage.  Mild to moderate small vessel  disease type changes.  Atrophy slightly disproportionate and most notable in the parietal lobes and cerebellum.  Ventricular prominence unchanged and may be related to atrophy rather hydrocephalus.  Poor delineation of  the right vertebral artery which may end in a PICA distribution.  The remainder of the major intracranial vascular structures are patent.  Mild polypoid mucosal thickening left maxillary sinus.  Partially empty sella incidentally noted.  IMPRESSION: No acute infarct.  No intracranial hemorrhage.  Mild to moderate small vessel disease type changes.  Atrophy as discussed above.  Scattered nonspecific calvarial lesions as noted on prior CT. Etiology indeterminate.  Original Report Authenticated By: Fuller Canada, M.D.   Medications: I have reviewed the patient's current medications. Scheduled Meds:   . aspirin EC  81 mg Oral Daily  . docusate sodium  100 mg Oral BID  . famotidine  40 mg Oral Daily  . feeding supplement  1 Container Oral TID WC  . ferrous fumarate  1 tablet Oral TID  . hydrocortisone  15 mg Oral Daily   And  . hydrocortisone  10 mg Oral Daily  . sertraline  25 mg Oral QHS   Continuous Infusions:   . sodium chloride 20 mL/hr at 03/04/11 2108   PRN Meds:.guaiFENesin-codeine, ondansetron (ZOFRAN) IV  Assessment/Plan: # Low Cortisol: Con't hydrocortisone tx, pt reports improvement in energy from admission.  Ddx remains megace vs partially empty sella. - ACTH, renin, aldosterone, levels pending  - con't hydrocortisone - f/u with Dr. Sharl Ma on 11/16 at 2pm at his office  # Normocytic anemia: Hb stable.  May benefit from EPO tx wiith outpt nephrology.  Anemia panel indicates component of Fe Def.  B12 also low, have repleted. - Fe tx  - outpt nephrology  # Colonic enlargement: CT shows nearly 8cm dilated colon, no definite obstruction. Has had stools. Minimal pain, no N/V.GI consult recommended no acute intervention, as prob is likely chronic. Some slight pain  today, very minimal. Will con't to evaluate, but no further action at this time.  - con't to monitor  - will f/u with Dr. Loreta Ave as outpt  # Acute on chronic renal failure: Creatinine is near her baseline. Renal u/s unremarkable. CKD has never been worked up in the past. She has underlying Stage III CKD which is likely the cause of her anemia. She has no outpatient nephrologist so we will likely have to refer her at her follow up.   - nephrology consult as outpatient   # Adult failure to thrive: She has not been eating and drinking well at home. She has also lost about 25 lbs as evidenced in the outpatient record over the last year or so. D/cing megace given possible relation to adrenal insufficiency. So far all malignancy w/u has been negative (CT chest, CT abd, CEA, MRI brain)  - per Nutrition consult, starting breeze supplement - d/c megace   #Ppx Lovenox  #Dispo  HHOT   LOS: 7 days   WILDMAN-TOBRINER, BEN 03/08/2011, 11:02 AM

## 2011-03-09 DIAGNOSIS — E2740 Unspecified adrenocortical insufficiency: Secondary | ICD-10-CM | POA: Diagnosis present

## 2011-03-09 MED ORDER — FERROUS FUMARATE 325 (106 FE) MG PO TABS: 1.0000 | ORAL_TABLET | Freq: Three times a day (TID) | ORAL | Status: DC

## 2011-03-09 MED ORDER — HYDROCORTISONE 5 MG PO TABS: 15.0000 mg | ORAL_TABLET | Freq: Every day | ORAL | Status: DC

## 2011-03-09 MED ORDER — DSS 100 MG PO CAPS: 100.0000 mg | ORAL_CAPSULE | Freq: Two times a day (BID) | ORAL | Status: AC | PRN

## 2011-03-09 MED ORDER — BOOST / RESOURCE BREEZE PO LIQD: 1.0000 | Freq: Three times a day (TID) | ORAL | Status: DC

## 2011-03-09 MED ORDER — FAMOTIDINE 40 MG PO TABS: 40.0000 mg | ORAL_TABLET | Freq: Every day | ORAL | Status: DC

## 2011-03-09 MED ORDER — HYDROCORTISONE 5 MG PO TABS: 10.0000 mg | ORAL_TABLET | Freq: Every day | ORAL | Status: DC

## 2011-03-09 NOTE — Progress Notes (Signed)
Subjective: Pt con't to do well, reports good energy levels.  Eating well, no pain, no N/V.  Objective: Vital signs in last 24 hours: Filed Vitals:   03/08/11 1352 03/08/11 1455 03/08/11 2059 03/09/11 0535  BP: 110/67  110/73 120/73  Pulse: 66  72 60  Temp: 97.6 F (36.4 C)  97.3 F (36.3 C) 98.4 F (36.9 C)  TempSrc: Oral  Oral Oral  Resp: 18  18 22   Height:      Weight:      SpO2: 95% 95% 95% 92%   Weight change:   Intake/Output Summary (Last 24 hours) at 03/09/11 0827 Last data filed at 03/08/11 1700  Gross per 24 hour  Intake    582 ml  Output    150 ml  Net    432 ml   Physical Exam: GEN: NAD. Alert and oriented x 3. Pleasant, conversant, and cooperative to exam.  RESP: CTAB, no w/r/r  CARDIOVASCULAR: RRR, S1, S2, 2/6 HSM @ RUSB  ABDOMEN: soft, slight TTP in lower abdomen, mild distension (stable)  EXT: warm and dry. No edema in b/l LE  Lab Results: Basic Metabolic Panel:  Lab 03/07/11 1610 03/06/11 0650  NA 141 148*  K 3.6 4.9  CL 113* 121*  CO2 19 18*  GLUCOSE 92 96  BUN 28* 23  CREATININE 1.26* 1.36*  CALCIUM 8.7 9.0  MG -- --  PHOS -- --   CBC:  Lab 03/08/11 0545 03/07/11 0510  WBC 9.1 8.1  NEUTROABS -- --  HGB 10.0* 9.7*  HCT 31.2* 29.6*  MCV 82.1 80.9  PLT 233 229   Cardiac Enzymes: No results found for this basename: CKTOTAL:3,CKMB:3,CKMBINDEX:3,TROPONINI:3 in the last 168 hours BNP:  Lab 03/04/11 1120  POCBNP 1432.0*   Thyroid Function Tests:  Lab 03/06/11 1354  TSH --  T4TOTAL --  FREET4 0.98  T3FREE --  THYROIDAB --   Anemia Panel:  Lab 03/03/11 0555  VITAMINB12 190*  FOLATE 15.3  FERRITIN 67  TIBC 236*  IRON 22*  RETICCTPCT 1.1    Micro Results: Recent Results (from the past 240 hour(s))  CULTURE, SPUTUM-ASSESSMENT     Status: Normal   Collection Time   03/01/11  7:04 PM      Component Value Range Status Comment   Specimen Description SPUTUM   Final    Special Requests NONE   Final    Sputum evaluation      Final    Value: MICROSCOPIC FINDINGS SUGGEST THAT THIS SPECIMEN IS NOT REPRESENTATIVE OF LOWER RESPIRATORY SECRETIONS. PLEASE RECOLLECT.     CALLED TO S COLUMBRES RN 11.2.12 AT 1947 BY ROMEROJ   Report Status 03/01/2011 FINAL   Final   CULTURE, BLOOD (ROUTINE X 2)     Status: Normal   Collection Time   03/01/11  7:30 PM      Component Value Range Status Comment   Specimen Description BLOOD LEFT ARM   Final    Special Requests BOTTLES DRAWN AEROBIC AND ANAEROBIC [facility] EACH   Final    Setup Time [id number]   Final    Culture NO GROWTH 5 DAYS   Final    Report Status 03/08/2011 FINAL   Final   CULTURE, BLOOD (ROUTINE X 2)     Status: Normal   Collection Time   03/01/11  7:40 PM      Component Value Range Status Comment   Specimen Description BLOOD LEFT ARM   Final    Special Requests BOTTLES DRAWN  AEROBIC AND ANAEROBIC [facility]   Final    Setup Time [id number]   Final    Culture NO GROWTH 5 DAYS   Final    Report Status 03/08/2011 FINAL   Final   CULTURE, SPUTUM-ASSESSMENT     Status: Normal   Collection Time   03/01/11  9:46 PM      Component Value Range Status Comment   Specimen Description SPUTUM   Final    Special Requests NONE   Final    Sputum evaluation     Final    Value: MICROSCOPIC FINDINGS SUGGEST THAT THIS SPECIMEN IS NOT REPRESENTATIVE OF LOWER RESPIRATORY SECRETIONS. PLEASE RECOLLECT.     CALLED TO S.COLUMBRES,RN 2220 11/02.12 BY M.CAMPBELL   Report Status 03/01/2011 FINAL   Final   CULTURE, SPUTUM-ASSESSMENT     Status: Normal   Collection Time   03/02/11  8:08 PM      Component Value Range Status Comment   Specimen Description SPUTUM   Final    Special Requests NONE   Final    Sputum evaluation     Final    Value: MICROSCOPIC FINDINGS SUGGEST THAT THIS SPECIMEN IS NOT REPRESENTATIVE OF LOWER RESPIRATORY SECRETIONS. PLEASE RECOLLECT.     NOTIFIED Rolinda Roan 2122 11.3.12   Report Status 03/02/2011 FINAL   Final   CLOSTRIDIUM DIFFICILE BY PCR     Status: Normal    Collection Time   03/03/11  8:30 AM      Component Value Range Status Comment   C difficile by pcr NEGATIVE  NEGATIVE  Final    Studies/Results: No results found. Medications: I have reviewed the patient's current medications. Scheduled Meds:   . aspirin EC  81 mg Oral Daily  . docusate sodium  100 mg Oral BID  . famotidine  40 mg Oral Daily  . feeding supplement  1 Container Oral TID WC  . ferrous fumarate  1 tablet Oral TID  . hydrocortisone  15 mg Oral Daily   And  . hydrocortisone  10 mg Oral Daily  . sertraline  25 mg Oral QHS   Continuous Infusions:   . sodium chloride 20 mL/hr at 03/04/11 2108   PRN Meds:.guaiFENesin-codeine, ondansetron (ZOFRAN) IV Assessment/Plan: # Low Cortisol: Con't hydrocortisone tx, pt reports improvement in energy from admission. Ddx remains megace vs partially empty sella.  - ACTH, renin, aldosterone, levels pending  - con't hydrocortisone  - f/u with Dr. Sharl Ma on 11/16 at 2pm at his office   # Normocytic anemia: Hb stable. May benefit from EPO tx wiith outpt nephrology. Anemia panel indicates component of Fe Def. B12 also low, have repleted.  - Fe tx  - outpt nephrology referral  # Colonic enlargement: CT shows nearly 8cm dilated colon, no definite obstruction. Has had stools. Minimal pain, no N/V.GI consult recommended no acute intervention, as prob is likely chronic. No pain today. - con't to monitor  - will f/u with Dr. Loreta Ave as outpt   # Acute on chronic renal failure: Creatinine is near her baseline. Renal u/s unremarkable. CKD has never been worked up in the past. She has underlying Stage III CKD which is likely the cause of her anemia. She has no outpatient nephrologist so we will refer.  - nephrology consult as outpatient   # Adult failure to thrive: She has not been eating and drinking well at home. She has also lost about 25 lbs as evidenced in the outpatient record over the last year or so. D/cing  megace given possible relation to  adrenal insufficiency. So far all malignancy w/u has been negative (CT chest, CT abd, CEA, MRI brain)  - per Nutrition consult, starting breeze supplement - d/c megace   #Ppx Lovenox   #Dispo  HHOT, likely today.   LOS: 8 days   WILDMAN-TOBRINER, BEN 03/09/2011, 8:27 AM

## 2011-03-09 NOTE — Plan of Care (Signed)
Problem: Phase III Progression Outcomes Goal: Discharge plan remains appropriate-arrangements made Pt a/o but forgetful, dressed with some assistance, skin intact, IV d/c right forearm, copy of d/c instructions given, Rx faxed to [facility] [address], daughter with pt and aware of instructions.  Via w/c escorted out to meet daughter for d/c home.

## 2011-03-09 NOTE — Discharge Summary (Signed)
Internal Medicine Teaching [facility] Discharge Note  Name: Rachel Bridges MRN: [id number] DOB: [birth year] 77 y.o.  Date of Admission: 03/01/2011  6:08 PM Date of Discharge: 03/09/2011 Attending Physician: Farley Ly, MD  Discharge Diagnosis: - Adult failure to thrive - Adrenal insufficiency - Normocytic anemia - Acute on chronic renal failure - Fatigue - Diarrhea - Dilated Colon  Discharge Medications: Current Discharge Medication List    START taking these medications   Details  docusate sodium 100 MG CAPS Take 100 mg by mouth 2 (two) times daily as needed for constipation ( please do not give if diarrhea or loose stools.). Qty: 50 capsule, Refills: 3    feeding supplement (RESOURCE BREEZE) LIQD Take 1 Container by mouth 3 (three) times daily with meals. Qty: 90 Container, Refills: 3    ferrous fumarate (HEMOCYTE - 106 MG FE) 325 (106 FE) MG TABS Take 1 tablet (106 mg of iron total) by mouth 3 (three) times daily. Qty: 90 each, Refills: 3    !! hydrocortisone (CORTEF) 5 MG tablet Take 3 tablets (15 mg total) by mouth daily. between 6-8 a.m. around the same time every day Qty: 100 tablet, Refills: 4    !! hydrocortisone (CORTEF) 5 MG tablet Take 2 tablets (10 mg total) by mouth daily. Between 4-6 p.m. around the same time every day. Qty: 70 tablet, Refills: 4     !! - Potential duplicate medications found. Please discuss with provider.    CONTINUE these medications which have NOT CHANGED   Details  aspirin 81 MG tablet Take 81 mg by mouth daily.     CALCIUM CARBONATE PO Take 1 tablet by mouth daily as needed. For indigestion. OTC - Unknown strength     cyanocobalamin (,VITAMIN B-12,) 1000 MCG/ML injection Inject 1,000 mcg into the muscle every 30 (thirty) days. Usually on the 21st of each month    metoCLOPramide (REGLAN) 5 MG tablet Take 1 tablet (5 mg total) by mouth 4 (four) times daily -  before meals and at bedtime. Qty: 30 tablet, Refills: 0      calcium citrate-vitamin D (CITRACAL+D) 315-200 MG-UNIT per tablet Take 1 tablet by mouth 2 (two) times daily. Qty: 100 tablet, Refills: 3   Associated Diagnoses: Gait abnormality    sertraline (ZOLOFT) 25 MG tablet Take 25 mg by mouth at bedtime.       STOP taking these medications     clarithromycin (BIAXIN) 250 MG tablet      megestrol (MEGACE) 400 MG/10ML suspension         Disposition and follow-up:   Rachel Bridges was discharged from [facility] in Stable condition.    Follow-up Appointments: Lars Mage On 03/29/2011 9:15 [address] [place] [address] [phone]  Talmage Coin On 03/15/2011 2:00pm [address] [facility] [place] [address] [phone]  Charna Elizabeth Call in 1 week [address], [place] [address] [phone]  [facility] Make an appointment We have referred you as a new patient. They should call you but if they don't, give them a call. [address]  [place] [address] [phone] Home Health Physical Therapy, Eugenie Birks Call [phone]   Discharge Orders    Future Appointments: Provider: Department: Dept Phone: Center:   03/29/2011 9:15 AM Lars Mage [facility] [phone] [facility]     Future Orders Please Complete By Expires   Diet - low sodium heart healthy  Increase activity slowly         Consultations:  Endocrinology, GI  Procedures Performed:  Ct Abdomen Pelvis Wo Contrast  03/05/2011  *RADIOLOGY REPORT*  Clinical Data: Evaluate for possible adrenal pathology.  CT ABDOMEN AND PELVIS WITHOUT CONTRAST  Technique:  Multidetector CT imaging of the abdomen and pelvis was performed following the standard protocol without intravenous contrast.  Comparison: 10/17/2009, 08/03/2007.  Findings: Coronary artery calcification.  Trace pericardial fluid.  Intra-abdominal organ evaluation is  limited without intravenous contrast.  Within this limitation, unremarkable liver, spleen, pancreas, right adrenal gland.  The left adrenal gland is nodular in configuration without a dominant nodule.  This is similar to the comparison exam.  Bilateral renal cysts are incompletely characterized however without significant interval change.  No hydronephrosis or hydroureter.  Colonic diverticulosis. Status post partial right colectomy. Markedly dilated bowel in the right hemiabdomen, similar to slightly increased from the comparison study. The hepatic flexure measures up to 8 cm. Thickened appearance to the ascending colon may be exaggerated by incomplete distension.  No pericolonic fat stranding.  The dilated bowel displaces the right intra-abdominal contents  medially.  No free intraperitoneal air or fluid.  Thin-walled bladder.  Unremarkable uterus and adnexa. Enlarged mesenteric lymph nodes.  There is scattered atherosclerotic calcification of the aorta and its branches. No aneurysmal dilatation.  Postsurgical changes L3-L5.  No acute osseous abnormality.  IMPRESSION: Marked proximal colonic dilatation without definite evidence for complete obstruction.  Nodular left adrenal gland without a dominant nodule.  This is nonspecific however unchanged from 2009.  Enlarged mesenteric lymph nodes, similar to the 2009 examination.  Original Report Authenticated By: Waneta Martins, M.D.   Dg Chest 2 View  03/02/2011  *RADIOLOGY REPORT*  Clinical Data: Cough.  Abdominal distention.  CHEST - 2 VIEW  Comparison: 03/01/2011  Findings: Cardiac silhouette normal in size and configuration.  No mediastinal or hilar masses or adenopathy.  Mild thickening of the bronchovascular markings most evident in the lung bases, stable. Lungs are otherwise clear.  Bony thorax is demineralized but intact.  Colonic distention underneath the right hemidiaphragm has improved. No other change from the previous day's study.  IMPRESSION: No  acute cardiopulmonary disease.  Decreased colonic distention underneath the right hemidiaphragm.  Original Report Authenticated By:    Dg Chest 2 View  03/01/2011  *RADIOLOGY REPORT*  Clinical Data: Pneumonia, cough with sputum production  CHEST - 2 VIEW  Comparison: 06/13/2007  Findings: Normal heart size, mediastinal contours, and pulmonary vascularity. Lungs appear emphysematous but clear. No pleural effusion or pneumothorax. Mild elevation of right diaphragm with interposition of colon between liver and diaphragm. Osseous demineralization diffusely.  IMPRESSION: Emphysematous changes. No acute abnormalities.  Original Report Authenticated By: Lollie Marrow, M.D.   Ct Chest Wo Contrast  03/05/2011  *RADIOLOGY REPORT*  Clinical Data: Weight loss.  Cough.  Smoker.  Rule out lung mass.  CT CHEST WITHOUT CONTRAST  Technique:  Multidetector CT imaging of the chest was performed following the standard protocol without IV contrast.  Comparison: 03/02/2011 chest x-ray.  03/09/2008 chest CT.  Findings: Right middle lobe 7.9 mm nodule (series 3 image 32), left lower lobe 4.7 mm nodule (series 3 image 32) and minimal hazy parenchymal changes along the peripheral aspect of the left upper lobe (series 3 image 18) unchanged since prior exam.  New areas of minimal atelectasis/scarring posterior lungs bilaterally.  No interval development of worrisome pulmonary parenchymal lesion.  Left lobe of thyroid substernal extension with 1.5 cm  lesion.  This can be followed with thyroid ultrasound.  Coronary artery calcifications.  Atherosclerotic type changes of the thoracic aorta with the ascending thoracic aorta measuring up to 3.7 cm.  No mediastinal, obvious hilar or axillary adenopathy.  Gas and fluid filled dilated colon extending below the hemidiaphragm.  This displaces and compresses the liver unchanged from prior exam.  Nodularity left adrenal gland and left renal cysts without significant change with the exception of  slight increase in size of left renal cyst.  No new bony destructive lesion.  Degenerative changes, postsurgical changes and hemangioma unchanged.  IMPRESSION: Stable chest parenchymal changes (as detailed above) without worrisome mass suspicious for pulmonary malignancy.  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  This can be followed with thyroid ultrasound.  Coronary artery calcifications.  Atherosclerotic type changes of the thoracic aorta with the ascending thoracic aorta measuring up to 3.7 cm.  Gas and fluid filled dilated colon extending below the hemidiaphragm.  This displaces and compresses the liver unchanged from prior exam.  Nodularity left adrenal gland and left renal cysts without significant change with the exception of slight increase in size of left renal cyst.  Original Report Authenticated By: Fuller Canada, M.D.   Mr Brain Wo Contrast  03/07/2011  **ADDENDUM** CREATED: 03/07/2011 17:49:17  The clinical history initially provided was incorrect.  Clinical history is adrenal insufficiency.  Rule out mass.  Small pituitary gland.  Pituitary protocol was not performed.  **END ADDENDUM** SIGNED BY: Almedia Balls. Constance Goltz, M.D.   03/07/2011  *RADIOLOGY REPORT*  Clinical Data: Altered mental status.  MRI HEAD WITHOUT CONTRAST  Technique:  Multiplanar, multiecho pulse sequences of the brain and surrounding structures were obtained according to standard protocol without intravenous contrast.  Comparison: 01/18/2008 CT.  No comparison MR.  Findings: No acute infarct.  Scattered nonspecific calvarial lesions as noted on prior CT. Etiology/significance indeterminate.  Otherwise no evidence of intracranial mass lesion noted on this unenhanced exam.  No intracranial hemorrhage.  Mild to moderate small vessel disease type changes.  Atrophy slightly disproportionate and most notable in the parietal lobes and cerebellum.  Ventricular prominence unchanged and may be related to atrophy rather  hydrocephalus.  Poor delineation of the right vertebral artery which may end in a PICA distribution.  The remainder of the major intracranial vascular structures are patent.  Mild polypoid mucosal thickening left maxillary sinus.  Partially empty sella incidentally noted.  IMPRESSION: No acute infarct.  No intracranial hemorrhage.  Mild to moderate small vessel disease type changes.  Atrophy as discussed above.  Scattered nonspecific calvarial lesions as noted on prior CT. Etiology indeterminate.  Original Report Authenticated By: Fuller Canada, M.D.   US Renal  03/04/2011  *RADIOLOGY REPORT*  Clinical Data: 77 year old female with acute on chronic renal failure.  RENAL/URINARY TRACT ULTRASOUND COMPLETE  Comparison:  CT abdomen pelvis 10/17/2009.  Findings:  Right Kidney:  No hydronephrosis.  Stable and simple by ultrasound appearing cystic lesions, the largest in the upper pole measuring 36-43 mm in diameter. Renal length 11.1 cm.  Left Kidney:  Distortion of the left renal contour related to chronic large exophytic cystic lesions.  The largest is bladder shape measuring up to 13.4 cm across.  These appear simple.  No hydronephrosis.  Renal parenchymal length approximate 13.2 cm.  Bladder:  Decompressed/not visualized.  IMPRESSION: Multiple chronic benign appearing renal cysts, large and exophytic on the left.  No acute renal findings by ultrasound.  Original Report Authenticated By:  H.LEE HALL III, M.D.    Admission HPI: The patient is an 77 year old female who presents as an admission from clinic. She was initially seen on October 25 for complaints of productive cough, fatigue, malaise, and poor appetite. At that time she was prescribed a 10 day course of clarithromycin for empiric treatment of bacterial bronchitis/community-acquired pneumonia. She notes her symptoms have persisted despite antibiotic therapy and she continues to experience cough and poor appetite. She admits to nausea but denies vomiting,  diarrhea, or abdominal pain. She denies fevers, sweats, or chills. She admits to contact with her 16 year old nephew has recently been sick with "a cold." She also admits to general fatigue and malaise. She denies dysuria or other urinary symptoms. Additionally she notes shortness of breath but states this is relatively unchanged from her baseline. She denies chest pain, syncope, or other complaint   Hospital Course by problem list: 1) Adult failure to thrive: Patient initially presented with weight loss, fatigue, and possible mild respiratory infection. As her course progressed, became clear that her fatigue is likely multifactorial. Possible etiologies include anemia, adrenal insufficiency, diarrhea and gastrointestinal problems, and possible malignancy. These issues were worked up: See below. By the end of hospitalization, the patient's energy level is improved, she had no cough, and she was feeling well. She will have home health physical therapy to help with her general conditioning at home.  2) Adrenal insufficiency: As part of workup for fatigue and failure to thrive, the patient was found to have a low a.m. cortisol. Additionally, her ACTH level was undetectable. ACTH stimulation test showed adequate response from the adrenals, indicating a secondary or tertiary adrenal insufficiency. Dr. Sharl Ma of endocrinology was consulted, and a noncontrast MRI (could not get contrast because of low GFR) of the brain was conducted which is a partially empty sella. Radial he felt this was likely normal for an 77 year old woman. The patient was also found to be on Megace therapy, which has been shown to lead to central adrenal suppression. We discontinued her Megace, and at the advice of Dr. Sharl Ma, began hydrocortisone placement therapy at a dose of 1 mg per meter square body surface area. Her dose turned out to be 15 mg in the morning and 6 and 8 AM, and 10 mg in the afternoon between 4 and 6 PM. She will followup with  Dr. Sharl Ma to continue the discussion and workup regarding her adrenal insufficiency. Her blood pressure remained stable throughout the duration of her hospitalization.  3) Normocytic anemia: Also contributing to her failure to thrive and fatigue is her chronic anemia. The patient has been found to be B12 deficient the past, and her B12 level is slightly low this admission. We repleted her with an injection. In addition, we feel there is a component of chronic kidney disease that may be contributing to her anemia. We have referred her to [place] [facility] to evaluate her for possible EPO replacement therapy. In addition, we feel that she may be slightly iron deficient, and we have started iron therapy at this time. Improvement in her hemoglobin may contribute to an improved energy level as well.  4) Acute on chronic renal failure: Patient presented with a slightly elevated creatinine to 1.4, which returned down to 1.2 with fluid repletion. Nonetheless, the patient's baseline GFR is in the 30s, which indicates chronic kidney disease. Renal ultrasound did not indicate any medical renal disease, and abdominal CT scan did not indicate any overt pathology. This may be secondary to hypertension, but  we feel that she should be worked up as an outpatient and evaluated for possible EPO replacement therapy given her chronic anemia. We have referred her to [place] [facility].  5) Fatigue: As indicated above, likely multifactorial secondary to anemia, adrenal insufficiency, and poor diet. However, given the patient's history of colon cancer and her extensive smoking history, there was some concern for underlying malignancy. Contrast could not be is any studies because of her low GFR, but noncontrast chest CT, noncontrast abdominal and pelvic CT, and MRI of the brain, were not suspicious for malignancy and were consistent with priors when available. It is likely that her fatigue is multifactorial as suspected.  6) GI: Patient  was found to have a dilated colon, concerning for partial obstruction. Given her past history of colon cancer and obstructions, we consulted the GI team to help evaluate. They felt that the problem was chronic and no intervention is necessary. The patient did have consistent bowel movements and did not have any nausea or vomiting consistent with obstruction. Her abdomen was dilated but consistently so. Patient with followup with her GI doctor as an outpatient.  7) Cough: Some initial concern for pneumonia versus URI, however it appears that the patient was coughing likely secondary to dysphasia. She had a swallow study that showed esophageal dysmotility, and the speech team recommended better compliance with PPI therapy. We also encouraged the patient to eat dinner in the evening to leave more time before bed so that she is not lying down immediately after eating.  Discharge Vitals:  BP 120/73  Pulse 60  Temp(Src) 98.4 F (36.9 C) (Oral)  Resp 22  Ht 5\' 7"  (1.702 m)  Wt 126 lb 12.2 oz (57.5 kg)  BMI 19.85 kg/m2  SpO2 92%  Discharge Labs: No results found for this or any previous visit (from the past 24 hour(s)).  SignedAbner Greenspan, BEN 03/09/2011, 9:24 AM

## 2011-03-11 NOTE — Progress Notes (Signed)
Faxed orders, F2F, d/c summary and facesheet to [place] for [facility] PT.

## 2011-03-14 NOTE — Progress Notes (Signed)
Addended by: Neomia Dear on: 03/14/2011 06:24 PM   Modules accepted: Orders

## 2011-03-15 ENCOUNTER — Encounter: Payer: Self-pay | Admitting: Internal Medicine

## 2011-03-15 LAB — ALDOSTERONE + RENIN ACTIVITY W/ RATIO
ALDO / PRA Ratio: 13.4 Ratio (ref 0.9–28.9)
Aldosterone: 9 ng/dL

## 2011-03-27 ENCOUNTER — Encounter: Payer: Medicare Other | Admitting: Internal Medicine

## 2011-03-29 ENCOUNTER — Encounter: Payer: Medicare Other | Admitting: Internal Medicine

## 2011-04-10 ENCOUNTER — Telehealth: Payer: Self-pay | Admitting: *Deleted

## 2011-04-10 ENCOUNTER — Encounter: Payer: Self-pay | Admitting: Internal Medicine

## 2011-04-10 ENCOUNTER — Ambulatory Visit (HOSPITAL_COMMUNITY)
Admission: RE | Admit: 2011-04-10 | Discharge: 2011-04-10 | Disposition: A | Discharge status: Home / Self Care | Payer: Medicare Other | Source: Ambulatory Visit | Attending: Internal Medicine | Admitting: Internal Medicine

## 2011-04-10 ENCOUNTER — Ambulatory Visit (INDEPENDENT_AMBULATORY_CARE_PROVIDER_SITE_OTHER): Payer: Medicare Other | Admitting: Internal Medicine

## 2011-04-10 VITALS — BP 119/87 | HR 78 | Temp 96.4°F | Ht 69.0 in | Wt 120.9 lb

## 2011-04-10 DIAGNOSIS — J069 Acute upper respiratory infection, unspecified: Secondary | ICD-10-CM

## 2011-04-10 MED ORDER — PHENYLEPHRINE-APAP-GUAIFENESIN 5-325-200 MG PO TABS: 1.0000 | ORAL_TABLET | Freq: Two times a day (BID) | ORAL | Status: DC | PRN

## 2011-04-10 NOTE — Progress Notes (Signed)
  Subjective:    Patient ID: Rachel Bridges, female    DOB: 1924/09/30, 77 y.o.   MRN: [id number]  HPI  Patient is an 77 year old female with a past medical history listed below, presents to the outpatient clinic with complaints of upper respiratory infection for the past week, reports reduced appetite malaise, cough and congestion, denies any fever chills. No other complaints  Patient Active Problem List  Diagnoses  . VITAMIN B12 DEFICIENCY  . HYPERLIPIDEMIA  . MONOCLONAL GAMMOPATHY  . ANXIETY  . DEPRESSION  . GERD  . CELIAC DISEASE  . DERMATITIS, SEBORRHEIC  . PSORIASIS  . GAIT IMBALANCE  . Abdominal pain  . Adult failure to thrive  . Shortness of breath  . Normocytic anemia  . Acute on chronic renal failure  . Fatigue  . Diarrhea  . Adrenal insufficiency   Current Outpatient Prescriptions on File Prior to Visit  Medication Sig Dispense Refill  . aspirin 81 MG tablet Take 81 mg by mouth daily.       Marland Kitchen CALCIUM CARBONATE PO Take 1 tablet by mouth daily as needed. For indigestion. OTC - Unknown strength       . calcium citrate-vitamin D (CITRACAL+D) 315-200 MG-UNIT per tablet Take 1 tablet by mouth 2 (two) times daily.  100 tablet  3  . cyanocobalamin (,VITAMIN B-12,) 1000 MCG/ML injection Inject 1,000 mcg into the muscle every 30 (thirty) days. Usually on the 21st of each month      . famotidine (PEPCID) 40 MG tablet Take 1 tablet (40 mg total) by mouth daily.  30 tablet  3  . feeding supplement (RESOURCE BREEZE) LIQD Take 1 Container by mouth 3 (three) times daily with meals.  90 Container  3  . ferrous fumarate (HEMOCYTE - 106 MG FE) 325 (106 FE) MG TABS Take 1 tablet (106 mg of iron total) by mouth 3 (three) times daily.  90 each  3  . metoCLOPramide (REGLAN) 5 MG tablet Take 1 tablet (5 mg total) by mouth 4 (four) times daily -  before meals and at bedtime.  30 tablet  0  . sertraline (ZOLOFT) 25 MG tablet Take 25 mg by mouth at bedtime.        Allergies  Allergen  Reactions  . Morphine And Related Other (See Comments)    delirium  . Oxycodone Hcl (Oxyir) Other (See Comments)    Unknown reaction  . Pantoprazole (Protonix) Nausea Only     Review of Systems  Constitutional: Positive for activity change, appetite change and fatigue. Negative for fever and chills.  Respiratory: Negative for cough, choking, chest tightness, shortness of breath and wheezing.        Objective:   Physical Exam  Vitals reviewed. Constitutional: She is oriented to person, place, and time. She appears well-developed and well-nourished. No distress.  HENT:  Head: Normocephalic and atraumatic.  Eyes: Pupils are equal, round, and reactive to light.  Neck: Normal range of motion. Neck supple. No JVD present. No thyromegaly present.  Cardiovascular: Normal rate, regular rhythm and normal heart sounds.   No murmur heard. Pulmonary/Chest: Effort normal and breath sounds normal. She has no wheezes. She has no rales.  Abdominal: Soft. Bowel sounds are normal.  Musculoskeletal: Normal range of motion. She exhibits no edema.  Neurological: She is alert and oriented to person, place, and time.  Skin: Skin is warm and dry.          Assessment & Plan:

## 2011-04-10 NOTE — Assessment & Plan Note (Signed)
Patient has signs and symptoms of URI she is out of treatment window for antivirals, given that this is likely viral we'll not prescribe any antibiotics, will give the patient supportive therapy with mucolytics, anti-inflammatory and decongestion medication. We'll also check an x-ray to ensure that there is no growing infection.

## 2011-04-10 NOTE — Telephone Encounter (Signed)
PT should be continued next week.

## 2011-04-10 NOTE — Telephone Encounter (Signed)
Pt's daughter calls and states pt started coughing Sunday and has gotten progressively worse, she is now dizzy and weak, does not know if there is fevers, does not know if there is productive cough... She states she is sick herself and cannot go to check on pt... appt is given for 1345 dr Gilford Rile per chilonb.

## 2011-04-10 NOTE — Patient Instructions (Signed)

## 2011-04-10 NOTE — Telephone Encounter (Signed)
Call from Minerva Areola, Physical Therapist He did not see pt this week for PT because pt had a fall over the weekend.  Pt tried to get in bed on own and fell.  No medical attention given. She is not feeling well now and will be seen by MD.  PT is not seeing her this week. Would you like PT restarted  Next week? [phone] )

## 2011-04-12 NOTE — Telephone Encounter (Signed)
Called eric gave order

## 2011-04-12 NOTE — Telephone Encounter (Signed)
Eric informed

## 2011-04-15 ENCOUNTER — Encounter: Payer: Medicare Other | Admitting: Internal Medicine

## 2011-04-26 ENCOUNTER — Telehealth: Payer: Self-pay | Admitting: *Deleted

## 2011-04-26 NOTE — Telephone Encounter (Signed)
Call from Theresa Duty PT from [place].  Pt has not started her PT was ill with a URI and later had a death in her family.  Pt said that will start her PT after the first of the year.  Just wanted [place] to know.  Angelina Ok, RN 04/26/2011 9:50 AM

## 2011-05-01 ENCOUNTER — Telehealth: Payer: Self-pay | Admitting: *Deleted

## 2011-05-01 NOTE — Telephone Encounter (Signed)
Received call at 4:10 from daughter. Pt's ankles swollen like soft balls, and skin is breaking down.  Sores appearing. Onset yesterday, getting worse today. Pt is alert and denies pain and denies SOB.  Please advise. Pt # [phone] Dr [place] Market has a 3:15 appointment tomorrow. Not sure if you want her to wait that long.

## 2011-05-01 NOTE — Telephone Encounter (Signed)
Talked with Dr Meredith Pel and pt asked to go to [facility] tonight.  They want to wait until tomorrow.  Appointment given for tomorrow but asked to bring pt to ED if any changes over night.

## 2011-05-02 ENCOUNTER — Other Ambulatory Visit: Payer: Self-pay | Admitting: Internal Medicine

## 2011-05-02 ENCOUNTER — Ambulatory Visit (HOSPITAL_COMMUNITY)
Admission: RE | Admit: 2011-05-02 | Discharge: 2011-05-02 | Disposition: A | Discharge status: Home / Self Care | Payer: Medicare Other | Source: Ambulatory Visit | Attending: Cardiovascular Disease | Admitting: Cardiovascular Disease

## 2011-05-02 ENCOUNTER — Ambulatory Visit (INDEPENDENT_AMBULATORY_CARE_PROVIDER_SITE_OTHER): Payer: Medicare Other | Admitting: Internal Medicine

## 2011-05-02 ENCOUNTER — Encounter: Payer: Self-pay | Admitting: Internal Medicine

## 2011-05-02 VITALS — BP 98/65 | HR 75 | Temp 97.1°F | Ht 66.0 in | Wt 134.1 lb

## 2011-05-02 DIAGNOSIS — R0602 Shortness of breath: Secondary | ICD-10-CM | POA: Insufficient documentation

## 2011-05-02 DIAGNOSIS — R34 Anuria and oliguria: Secondary | ICD-10-CM

## 2011-05-02 DIAGNOSIS — R609 Edema, unspecified: Secondary | ICD-10-CM | POA: Insufficient documentation

## 2011-05-02 DIAGNOSIS — R600 Localized edema: Secondary | ICD-10-CM | POA: Insufficient documentation

## 2011-05-02 DIAGNOSIS — R9431 Abnormal electrocardiogram [ECG] [EKG]: Secondary | ICD-10-CM | POA: Insufficient documentation

## 2011-05-02 DIAGNOSIS — N189 Chronic kidney disease, unspecified: Secondary | ICD-10-CM

## 2011-05-02 LAB — CARDIAC PANEL(CRET KIN+CKTOT+MB+TROPI)
CK, MB: 2.8 ng/mL (ref 0.3–4.0)
Relative Index: INVALID (ref 0.0–2.5)
Total CK: 32 U/L (ref 7–177)
Troponin I: <0.3 ng/mL (ref <0.30)

## 2011-05-02 LAB — BASIC METABOLIC PANEL
Calcium: 7.6 mg/dL — ABNORMAL LOW (ref 8.4–10.5)
Potassium: 3 mEq/L — ABNORMAL LOW (ref 3.5–5.3)
Sodium: 144 mEq/L (ref 135–145)

## 2011-05-02 LAB — CBC
HCT: 36.2 % (ref 36.0–46.0)
MCHC: 31.8 g/dL (ref 30.0–36.0)
Platelets: 194 10*3/uL (ref 150–400)
RDW: 16.7 % — ABNORMAL HIGH (ref 11.5–15.5)
WBC: 5.7 10*3/uL (ref 4.0–10.5)

## 2011-05-02 MED ORDER — FUROSEMIDE 20 MG PO TABS: 20.0000 mg | ORAL_TABLET | Freq: Every day | ORAL | Status: DC

## 2011-05-02 MED ORDER — POTASSIUM CHLORIDE ER 10 MEQ PO TBCR: 20.0000 meq | EXTENDED_RELEASE_TABLET | Freq: Two times a day (BID) | ORAL | Status: DC

## 2011-05-02 NOTE — Assessment & Plan Note (Signed)
Patient presents with acute onset peripheral edema, shortness of breath and oliguria, concerning for cardiac or renal etiology. Stat CBC, cardiac enzymes, ekg, and bmet are all within normal limits for this patient. Her potassium was 3.0, and her creatinine was 1.09. This patient does have baseline EKG, but this creatinine is well within her normal limits and does not indicate AKI. We do not have an exact etiology for the peripheral edema at this time, but we will prescribe K-Dur and Lasix and see the patient back on Monday for followup. At that time her peripheral edema she be reassessed, and management will go from there.

## 2011-05-02 NOTE — Progress Notes (Signed)
Subjective:     Patient ID: Rachel Bridges, female   DOB: 1924/07/01, 78 y.o.   MRN: [id number]  HPI Patient is a very pleasant 78 year old woman with a history of Patient Active Problem List  Diagnoses  . VITAMIN B12 DEFICIENCY  . HYPERLIPIDEMIA  . MONOCLONAL GAMMOPATHY  . ANXIETY  . DEPRESSION  . GERD  . CELIAC DISEASE  . DERMATITIS, SEBORRHEIC  . PSORIASIS  . GAIT IMBALANCE  . Abdominal pain  . Adult failure to thrive  . Shortness of breath  . Normocytic anemia  . Acute on chronic renal failure  . Fatigue  . Diarrhea  . Adrenal insufficiency  . URI (upper respiratory infection)   Patient had been seen in clinic last month for a viral illness, and had recovered and was in her usual state of health until 2 days ago when she noticed sudden onset of bilateral lower extremity edema. The patient has also had new onset dyspnea on exertion, getting short of breath with as little as getting dressed. She presents to clinic with her daughter, who says that the patient had not had any overly salty meals, has not had any chest pain or palpitations, and who has not had anything out of the ordinary have been this week. The patient's daughter says that she has only urinated once in the past 24 hours, which is very different from her baseline of many urinations per night and throughout the day.   The patient remains on hydrocortisone supplementation through Dr. Sharl Ma (endocrinology) and has been doing well. She was supposed to be referred to nephrology, but never heard from them.   Review of Systems     Objective:   Physical Exam Physical Exam GEN: NAD.  Alert and oriented x 3.  Pleasant, conversant, and cooperative to exam. RESP:  CTAB, no w/r/r CARDIOVASCULAR: RRR, S1, S2, 3/6 HSM @ LUSB, c/w prior exams ABDOMEN: distended (baseline), active bowel sounds EXT: b/l 2+ pitting edema to just below knee.  Feet very cool to touch b/l.  Palpable pulses b/l.  Skin is tight c dryness.       Assessment:         Plan:

## 2011-05-03 ENCOUNTER — Other Ambulatory Visit: Payer: Self-pay | Admitting: Internal Medicine

## 2011-05-03 NOTE — Progress Notes (Signed)
Patient seen and examined. I agree with assessment and plan as per Dr. Abner Greenspan.

## 2011-05-06 ENCOUNTER — Ambulatory Visit (HOSPITAL_COMMUNITY)
Admission: RE | Admit: 2011-05-06 | Discharge: 2011-05-06 | Disposition: A | Discharge status: Home / Self Care | Payer: Medicare Other | Source: Ambulatory Visit | Attending: Internal Medicine | Admitting: Internal Medicine

## 2011-05-06 ENCOUNTER — Encounter: Payer: Self-pay | Admitting: Internal Medicine

## 2011-05-06 ENCOUNTER — Ambulatory Visit (INDEPENDENT_AMBULATORY_CARE_PROVIDER_SITE_OTHER): Payer: Medicare Other | Admitting: Internal Medicine

## 2011-05-06 VITALS — BP 102/73 | HR 74 | Temp 96.6°F | Ht 67.0 in | Wt 127.7 lb

## 2011-05-06 DIAGNOSIS — R609 Edema, unspecified: Secondary | ICD-10-CM

## 2011-05-06 DIAGNOSIS — R05 Cough: Secondary | ICD-10-CM | POA: Insufficient documentation

## 2011-05-06 DIAGNOSIS — R059 Cough, unspecified: Secondary | ICD-10-CM | POA: Insufficient documentation

## 2011-05-06 LAB — SEDIMENTATION RATE: Sed Rate: 8 mm/hr (ref 0–22)

## 2011-05-06 MED ORDER — FUROSEMIDE 20 MG PO TABS: 40.0000 mg | ORAL_TABLET | Freq: Every day | ORAL | Status: DC

## 2011-05-06 NOTE — Progress Notes (Signed)
Pt and daughter aware 2D echo 05/08/11 2PM [name] Keimari  RN 05/06/11 12N

## 2011-05-06 NOTE — Assessment & Plan Note (Addendum)
Peripheral edema is improved on 20 mg Lasix, but continues to be very evident. It does improve with elevation of the legs. The feet are still cyanotic and cool, though much warmer than last Thursday. Her shortness of breath has resolved, but she continues to have a mild dry cough. Given the lack of resolution of her symptoms, we will continue workup. The exact etiology remains unclear at this time. The differential includes cardiac etiology, hypoalbuminemia (though sudden onset would be confusing), PE, renal pathology, or something it to be determined.  I'm not sure why her feet or so cyanotic, though I suspect that she has poor circulation in response to the fluid overload. She does have palpable pulses, though the posterior tibialis pulses are very difficult to feel. I don't think she has arterial insufficiency given her lack of pain, and the bilateral nature of the findings. The patient is seeing her endocrinologist Dr. Sharl Ma tomorrow. I told the patient that he should feel free to contact me with any questions or contributions to her care. - CXR - Echocardiogram - CMP - UA with microalbumin - Increase Lasix to 40, call patient daughter at [phone] to discuss potassium dose depending on what CMP shows - ESR - Followup in clinic on Friday

## 2011-05-06 NOTE — Progress Notes (Signed)
Patient seen and examined. She is better than last week and does not look acutely ill. The etiology of her edema is still not understood. I agree with Dr. Mariel Aloe assessment and plan.

## 2011-05-06 NOTE — Progress Notes (Signed)
Subjective:     Patient ID: Rachel Bridges, female   DOB: August 08, 1924, 78 y.o.   MRN: [id number]  HPI Mr. Lalley is a very pleasant 78 year old woman who presents for followup of sudden onset bilateral lower extremity edema. She was seen on 4 days ago in clinic with sudden onset peripheral edema, and mild shortness of breath and oliguria. The patient was prescribed Lasix 20 mg, and her lower extremity edema has improved. The temperature of her feet has also improved (warm or), though she still gets exacerbation of edema when standing up. As long as she is supine with legs elevated, the edema is improved. She did not have any shortness of breath. She still is urinating less than normal, though she is using the bathroom. Stat workup last week was unrevealing, with normal creatinine, normal troponin, and unremarkable EKG.  Review of Systems     Objective:   Physical Exam Physical Exam GEN: NAD.  Alert and oriented x 3.  Pleasant, conversant, and cooperative to exam. RESP:  CTAB, no w/r/r CARDIOVASCULAR: RRR, S1, S2, 2/6 HSM at LUSB c/w prior ABDOMEN: soft, NT/ND, NABS EXT: warm and dry. 2+ pitting edema in b/l LE, improved from prior.  Cyanotic feet, now warmer than prior.  Pulses faintly palpable beneath edema.     Assessment:         Plan:

## 2011-05-07 ENCOUNTER — Other Ambulatory Visit: Payer: Self-pay | Admitting: Internal Medicine

## 2011-05-07 LAB — URINALYSIS, ROUTINE W REFLEX MICROSCOPIC
Bilirubin Urine: NEGATIVE
Glucose, UA: NEGATIVE mg/dL
Hgb urine dipstick: NEGATIVE
Leukocytes, UA: NEGATIVE
pH: 5 (ref 5.0–8.0)

## 2011-05-07 LAB — COMPLETE METABOLIC PANEL WITH GFR
ALT: 12 U/L (ref 0–35)
AST: 19 U/L (ref 0–37)
Albumin: 3 g/dL — ABNORMAL LOW (ref 3.5–5.2)
Alkaline Phosphatase: 90 U/L (ref 39–117)
BUN: 21 mg/dL (ref 6–23)
Chloride: 107 mEq/L (ref 96–112)
Potassium: 4.2 mEq/L (ref 3.5–5.3)
Sodium: 144 mEq/L (ref 135–145)

## 2011-05-07 LAB — MICROALBUMIN / CREATININE URINE RATIO
Microalb Creat Ratio: 8.3 mg/g (ref 0.0–30.0)
Microalb, Ur: 0.95 mg/dL (ref 0.00–1.89)

## 2011-05-08 ENCOUNTER — Ambulatory Visit (HOSPITAL_COMMUNITY)
Admission: RE | Admit: 2011-05-08 | Discharge: 2011-05-08 | Disposition: A | Discharge status: Home / Self Care | Payer: Medicare Other | Source: Ambulatory Visit | Attending: Internal Medicine | Admitting: Internal Medicine

## 2011-05-08 DIAGNOSIS — I319 Disease of pericardium, unspecified: Secondary | ICD-10-CM | POA: Insufficient documentation

## 2011-05-08 DIAGNOSIS — R609 Edema, unspecified: Secondary | ICD-10-CM

## 2011-05-08 NOTE — Progress Notes (Signed)
Echocardiogram 2D Echocardiogram has been performed.  Katheren Puller 05/08/2011, 3:26 PM

## 2011-05-10 ENCOUNTER — Encounter: Payer: Self-pay | Admitting: Internal Medicine

## 2011-05-10 ENCOUNTER — Ambulatory Visit (INDEPENDENT_AMBULATORY_CARE_PROVIDER_SITE_OTHER): Payer: Medicare Other | Admitting: Internal Medicine

## 2011-05-10 VITALS — BP 110/60 | HR 86 | Temp 97.0°F | Ht 67.0 in | Wt 130.6 lb

## 2011-05-10 DIAGNOSIS — R609 Edema, unspecified: Secondary | ICD-10-CM

## 2011-05-10 MED ORDER — FUROSEMIDE 20 MG PO TABS: 20.0000 mg | ORAL_TABLET | Freq: Every day | ORAL | Status: DC

## 2011-05-10 MED ORDER — POTASSIUM CHLORIDE CRYS ER 10 MEQ PO TBCR: 10.0000 meq | EXTENDED_RELEASE_TABLET | Freq: Every day | ORAL | Status: DC

## 2011-05-10 NOTE — Assessment & Plan Note (Signed)
Peripheral edema is slightly improved from this Monday (5 days ago), but continues to be evidence in the bilateral extremities to midcalf. The temperature of the bilateral feet has improved, as has the color. Nonetheless, she does continue to have significant pedal edema. She did get an echocardiogram this week, which showed a good EF, grade 1 diastolic dysfunction, and a small pericardial effusion behind the heart. She had good right heart function as well. I do not think these findings are concerning, nor do they explain her sudden onset peripheral edema. Her ESR was within normal limits, her CMP was unremarkable. This is all reassuring for a noncardiogenic cause of peripheral edema, and her kidney function remains stable. The exact etiology remains unclear.   Dr. Sharl Ma, her endocrinologist, thought that her hydrocortisone could be a continuing factor, but is not convinced. Nonetheless he had decreased her hydrocortisone from 15 mg down to 10 mg per day. When she initially presented a week and half ago with this problem, her weight was 134. After starting Lasix he decreased down to 127, but today is back up to 130. Nonetheless, we will decrease her Lasix to 20 mg today, check a BMP for potassium level, decrease her potassium down to 10 meq daily and adjust as necessary pending the results of the BMP. She will followup in 2 weeks for continued evaluation. - Lasix back down to 20 daily - Potassium back down to 10 daily - BMP today - Try compression stockings, which she or he has, but with careful attention to foot temperature. If her feet get colder, this should be discontinued. - Wear socks and elevate legs when possible - Continue hydrocortisone modification as per Dr. Sharl Ma - Followup in 2 weeks

## 2011-05-10 NOTE — Progress Notes (Signed)
Subjective:     Patient ID: ANMOL PASCHEN, female   DOB: Dec 05, 1924, 78 y.o.   MRN: [id number]  HPI Ms. Matthes is a very pleasant 78 year old woman who presents for followup of her peripheral edema that we have been following closely over the past 2 weeks. She says that she is slightly improved, with her edema slightly down, temperature of her feet improving. She does however have a head cold that she just can't recently and has some cough. She saw Dr. Sharl Ma, her endocrinologist, earlier this week, who said that her hydrocortisone could be contributing to her edema, though because it was so sudden it is somewhat unlikely. Nonetheless, Dr. Sharl Ma did decrease her hydrocortisone from 15 mg total per day down to 10 mg total per day.  Review of Systems Mild cough, nasal congestion    Objective:   Physical Exam GEN: NAD.  Alert and oriented x 3.  Pleasant, conversant, and cooperative to exam. RESP:  CTAB, no w/r/r CARDIOVASCULAR: RRR, S1, S2, 2/6 HSM at LUSB EXT: warmer than prior, toes still slightly cold.  Toes appear white c poor circulation b/l.  Pulses palpable b/l.  2+ pitting edema to mid-calf, worst in R foot.  No broken skin or lesions.     Assessment:         Plan:

## 2011-05-11 LAB — BASIC METABOLIC PANEL WITH GFR
Calcium: 7.5 mg/dL — ABNORMAL LOW (ref 8.4–10.5)
Chloride: 108 mEq/L (ref 96–112)
Creat: 1.1 mg/dL (ref 0.50–1.10)
GFR, Est Non African American: 46 mL/min — ABNORMAL LOW
Sodium: 144 mEq/L (ref 135–145)

## 2011-05-27 ENCOUNTER — Ambulatory Visit (INDEPENDENT_AMBULATORY_CARE_PROVIDER_SITE_OTHER): Payer: Medicare Other | Admitting: Internal Medicine

## 2011-05-27 ENCOUNTER — Encounter: Payer: Self-pay | Admitting: Internal Medicine

## 2011-05-27 DIAGNOSIS — E2740 Unspecified adrenocortical insufficiency: Secondary | ICD-10-CM

## 2011-05-27 DIAGNOSIS — N189 Chronic kidney disease, unspecified: Secondary | ICD-10-CM

## 2011-05-27 DIAGNOSIS — R110 Nausea: Secondary | ICD-10-CM

## 2011-05-27 DIAGNOSIS — E2749 Other adrenocortical insufficiency: Secondary | ICD-10-CM

## 2011-05-27 DIAGNOSIS — D472 Monoclonal gammopathy: Secondary | ICD-10-CM

## 2011-05-27 DIAGNOSIS — R609 Edema, unspecified: Secondary | ICD-10-CM

## 2011-05-27 DIAGNOSIS — F329 Major depressive disorder, single episode, unspecified: Secondary | ICD-10-CM

## 2011-05-27 MED ORDER — ONDANSETRON HCL 4 MG PO TABS: 4.0000 mg | ORAL_TABLET | Freq: Every day | ORAL | Status: DC | PRN

## 2011-05-27 MED ORDER — SERTRALINE HCL 25 MG PO TABS: 25.0000 mg | ORAL_TABLET | Freq: Every day | ORAL | Status: DC

## 2011-05-27 MED ORDER — FUROSEMIDE 20 MG PO TABS: 40.0000 mg | ORAL_TABLET | Freq: Every day | ORAL | Status: DC

## 2011-05-27 NOTE — Assessment & Plan Note (Addendum)
Patient has creatinine of 1.0-1.2 with a low GFR in the 30s to 40s, has appointment with nephrology in 06/13/11. These lab abnormalities may be consistent with normal aging and nephrosclerosis, but we feel least one appointment with nephrology is warranted. This issue was first noted back in November when she was hospitalized, but she is just now getting in to see nephrology.

## 2011-05-27 NOTE — Progress Notes (Signed)
Subjective:     Patient ID: TEASHA MURRILLO, female   DOB: 1924/06/12, 78 y.o.   MRN: [id number]  HPI This is a very pleasant 78 year old woman with a history of adrenal insufficiency, depression, history of colon cancer, anemia, MGUS, and most recently acute onset peripheral edema that we have been following this month who presents for followup.  Her bilateral pitting edema has improved over the past 2 weeks, as she continues to take Lasix 20 mg daily and potassium twice daily. The right foot remains slightly more swollen the left foot, but overall the patient and the patient's daughter both feel that she is improved.  She has a new patient appointment with nephrology on 2/14.  She has followup with Dr. Sharl Ma, her endocrinologist in the middle of February.  Review of Systems     Objective:   Physical Exam GEN: NAD.  Alert and oriented x 3.  Pleasant, conversant, and cooperative to exam. RESP:  CTAB, no w/r/r CARDIOVASCULAR: RRR, S1, S2, 2/6 HSM @ LUSB ABDOMEN: soft, NT/ND, NABS EXT: warm and dry. 1+ pitting edema in b/l feet and lower legs, R worse than L.     Assessment:         Plan:

## 2011-05-27 NOTE — Assessment & Plan Note (Signed)
Continues to be followed by Dr. Sharl Ma, who felt that her hydrocortisone supplementation could be contributory to her peripheral edema. He had decreased her dose earlier in the month, and her edema has improved. It is difficult to tell how much of a factor this is. She has followup with him in the middle of February.

## 2011-05-27 NOTE — Assessment & Plan Note (Signed)
Peripheral edema much improved from 2 weeks ago. Color and temperature of the bilateral feet is improved, and edema is reduced. Patient does continue to have edema in the bilateral feet and ankles, worse on the right than left, though overall it is improved. Repeat labs continued to be reassuring, and she continues to have no crackles on lung auscultation. Because she still has edema and some foot discomfort, I will increase her Lasix to 40 daily. I will keep potassium at the same dose of 10 twice daily. - Increase Lasix to 40 daily - Continue potassium 10 twice a day - Recheck BMP at next visit - Reevaluate edema at next visit

## 2011-05-27 NOTE — Assessment & Plan Note (Signed)
Not sure if the patient never made it to a hematology followup appointment. Now that her edema is stabilized, I think at her followup with her PCP in 4 weeks that another attempt at a followup appointment to be made.

## 2011-06-10 ENCOUNTER — Encounter: Payer: Medicare Other | Admitting: Internal Medicine

## 2011-06-28 ENCOUNTER — Encounter: Payer: Medicare Other | Admitting: Internal Medicine

## 2011-07-01 NOTE — Progress Notes (Signed)
Addended by: Neomia Dear on: 07/01/2011 04:06 PM   Modules accepted: Orders

## 2011-07-09 ENCOUNTER — Other Ambulatory Visit: Payer: Self-pay | Admitting: Nephrology

## 2011-07-09 DIAGNOSIS — I10 Essential (primary) hypertension: Secondary | ICD-10-CM

## 2011-07-19 ENCOUNTER — Other Ambulatory Visit: Payer: Medicare Other

## 2011-07-25 ENCOUNTER — Ambulatory Visit
Admission: RE | Admit: 2011-07-25 | Discharge: 2011-07-25 | Disposition: A | Discharge status: Home / Self Care | Payer: Medicare Other | Source: Ambulatory Visit | Attending: Nephrology | Admitting: Nephrology

## 2011-07-25 DIAGNOSIS — I10 Essential (primary) hypertension: Secondary | ICD-10-CM

## 2011-07-31 ENCOUNTER — Emergency Department (HOSPITAL_COMMUNITY)
Admission: EM | Admit: 2011-07-31 | Discharge: 2011-08-01 | Disposition: A | Discharge status: Home / Self Care | Payer: Medicare Other | Attending: Emergency Medicine | Admitting: Emergency Medicine

## 2011-07-31 ENCOUNTER — Encounter (HOSPITAL_COMMUNITY): Payer: Self-pay | Admitting: Emergency Medicine

## 2011-07-31 DIAGNOSIS — R6883 Chills (without fever): Secondary | ICD-10-CM | POA: Insufficient documentation

## 2011-07-31 DIAGNOSIS — R5381 Other malaise: Secondary | ICD-10-CM | POA: Insufficient documentation

## 2011-07-31 DIAGNOSIS — R110 Nausea: Secondary | ICD-10-CM | POA: Insufficient documentation

## 2011-07-31 DIAGNOSIS — R197 Diarrhea, unspecified: Secondary | ICD-10-CM | POA: Insufficient documentation

## 2011-07-31 DIAGNOSIS — F341 Dysthymic disorder: Secondary | ICD-10-CM | POA: Insufficient documentation

## 2011-07-31 DIAGNOSIS — R5383 Other fatigue: Secondary | ICD-10-CM | POA: Insufficient documentation

## 2011-07-31 DIAGNOSIS — R1032 Left lower quadrant pain: Secondary | ICD-10-CM | POA: Insufficient documentation

## 2011-07-31 DIAGNOSIS — Z79899 Other long term (current) drug therapy: Secondary | ICD-10-CM | POA: Insufficient documentation

## 2011-07-31 DIAGNOSIS — R142 Eructation: Secondary | ICD-10-CM | POA: Insufficient documentation

## 2011-07-31 DIAGNOSIS — Z7982 Long term (current) use of aspirin: Secondary | ICD-10-CM | POA: Insufficient documentation

## 2011-07-31 DIAGNOSIS — F068 Other specified mental disorders due to known physiological condition: Secondary | ICD-10-CM | POA: Insufficient documentation

## 2011-07-31 DIAGNOSIS — R141 Gas pain: Secondary | ICD-10-CM | POA: Insufficient documentation

## 2011-07-31 DIAGNOSIS — M545 Low back pain, unspecified: Secondary | ICD-10-CM | POA: Insufficient documentation

## 2011-07-31 DIAGNOSIS — N390 Urinary tract infection, site not specified: Secondary | ICD-10-CM | POA: Insufficient documentation

## 2011-07-31 DIAGNOSIS — Z85038 Personal history of other malignant neoplasm of large intestine: Secondary | ICD-10-CM | POA: Insufficient documentation

## 2011-07-31 LAB — URINALYSIS, ROUTINE W REFLEX MICROSCOPIC
Ketones, ur: NEGATIVE mg/dL
Specific Gravity, Urine: 1.023 (ref 1.005–1.030)
Urobilinogen, UA: 1 mg/dL (ref 0.0–1.0)

## 2011-07-31 LAB — DIFFERENTIAL
Basophils Relative: 0 % (ref 0–1)
Eosinophils Absolute: 0.1 10*3/uL (ref 0.0–0.7)
Eosinophils Relative: 1 % (ref 0–5)
Lymphs Abs: 1.2 10*3/uL (ref 0.7–4.0)
Monocytes Absolute: 0.4 10*3/uL (ref 0.1–1.0)
Monocytes Relative: 6 % (ref 3–12)

## 2011-07-31 LAB — CBC
Hemoglobin: 11.4 g/dL — ABNORMAL LOW (ref 12.0–15.0)
MCH: 28.3 pg (ref 26.0–34.0)
MCHC: 32.1 g/dL (ref 30.0–36.0)
MCV: 88.1 fL (ref 78.0–100.0)
RBC: 4.03 MIL/uL (ref 3.87–5.11)

## 2011-07-31 LAB — BASIC METABOLIC PANEL
BUN: 20 mg/dL (ref 6–23)
Calcium: 8.1 mg/dL — ABNORMAL LOW (ref 8.4–10.5)
Creatinine, Ser: 1.06 mg/dL (ref 0.50–1.10)
GFR calc Af Amer: 54 mL/min — ABNORMAL LOW (ref >90)
GFR calc non Af Amer: 47 mL/min — ABNORMAL LOW (ref >90)
Glucose, Bld: 144 mg/dL — ABNORMAL HIGH (ref 70–99)
Potassium: 3.4 mEq/L — ABNORMAL LOW (ref 3.5–5.1)

## 2011-07-31 LAB — URINE MICROSCOPIC-ADD ON

## 2011-07-31 MED ORDER — SODIUM CHLORIDE 0.9 % IV BOLUS (SEPSIS)
500.0000 mL | Freq: Once | INTRAVENOUS | Status: AC
Start: 2011-07-31 — End: 2011-08-01
  Administered 2011-07-31: 500 mL via INTRAVENOUS

## 2011-07-31 MED ORDER — IOHEXOL 300 MG/ML  SOLN
20.0000 mL | INTRAMUSCULAR | Status: AC
  Administered 2011-07-31 – 2011-08-01 (×2): 20 mL via ORAL

## 2011-07-31 MED ORDER — ONDANSETRON HCL 4 MG/2ML IJ SOLN
4.0000 mg | Freq: Once | INTRAMUSCULAR | Status: AC
  Administered 2011-07-31: 4 mg via INTRAVENOUS
  Filled 2011-07-31: qty 2

## 2011-07-31 NOTE — ED Notes (Signed)
PT. REPORTS NAUSEA WITH CHILLS ONSET THIS EVENING / MILD ABDOMINAL CRAMPING , OCCASIONAL DIARRHEA.

## 2011-07-31 NOTE — ED Provider Notes (Signed)
History     CSN: [id number]  Arrival date & time 07/31/11  2037   First MD Initiated Contact with Patient 07/31/11 2237      Chief Complaint  Patient presents with  . Nausea    (Consider location/radiation/quality/duration/timing/severity/associated sxs/prior treatment) HPI  Patient presents to emergency department with her daughters at bedside with one daughter who she lives with complaining of a 5 day history of generalized fatigue and weakness with acute onset lower abdominal pain that began just prior to arrival but has resolved. Patient and her daughter state that on Easter Sunday "the whole family began to feel bad" stating vague symptoms of generalized weakness that has been persistent throughout the week deny fevers, chills, vomiting or cough. Daughter and the patient states that the patient has been sleeping increased hours throughout the day. Patient has history of some chronic diarrhea associated with bowel resection and gluten intolerance. Patient denies known fevers, but has felt chilled and mildly nauseated. Denies chest pain, shortness of breath, cough, dysuria, or hematuria. Patient finished amoxicillin approximately 4-5 days ago for urinary tract infection. Patient's daughter states that she took the patient to the bathroom to collect the urine in triage and states "the urine smelled very foul." Patient has daily chronic diarrhea but denies any blood or foul smelling or diarrhea. Patient states that she was at supper with her daughters this evening and finished supper and had acute onset lower back and lower abdominal pain. Patient states pain was aggravated by walking to the car. Patient states pain has since resolved. She is lying comfortably in bed without complaint. Patient took nothing for pain PTA.   Past Medical History  Diagnosis Date  . MGUS (monoclonal gammopathy of unknown significance)      [facility]  . Anxiety   . History of  colon cancer      History of bowel  obstruction. on treatment with Colace and MiraLax.  followed by Dr. Loreta Ave and Dr. Purnell Shoemaker  . Depression   . Diverticulosis of colon   . Osteopenia   . Cellulitis      left elbow  . Lumbar spinal stenosis   . Atrophic vaginitis   . Renal cyst   . History of tobacco abuse   . Dermatitis   . Hip pain      History  . Actinic keratosis   . Dementia     Past Surgical History  Procedure Date  . Hemicolectomy      Right-sided  .  resection of ovarian cyst   . Cataract extraction      Left eye  . Knee arthroscopy      Left knee  . Appendectomy   . Carpal tunnel release   . Lumbar laminectomy   . Lumbar fusion   . Replacement total knee     left knee    No family history on file.  History  Substance Use Topics  . Smoking status: Former Smoker    Quit date: 08/01/1988  . Smokeless tobacco: Not on file  . Alcohol Use: No    OB History    Grav Para Term Preterm Abortions TAB SAB Ect Mult Living                  Review of Systems  All other systems reviewed and are negative.    Allergies  Morphine and related; Oxycodone hcl; and Pantoprazole  Home Medications   Current Outpatient Rx  Name  Route Sig Dispense Refill  . ASPIRIN 81 MG PO TABS Oral Take 81 mg by mouth daily.     Marland Kitchen CALCIUM CITRATE-VITAMIN D 315-200 MG-UNIT PO TABS Oral Take 1 tablet by mouth 2 (two) times daily.    Marland Kitchen FAMOTIDINE 40 MG PO TABS Oral Take 40 mg by mouth daily.    Marland Kitchen FERROUS FUMARATE 325 (106 FE) MG PO TABS Oral Take 1 tablet by mouth 3 (three) times daily.    . FUROSEMIDE 40 MG PO TABS Oral Take 40 mg by mouth 2 (two) times daily.    Marland Kitchen HYDROCORTISONE 5 MG PO TABS Oral Take 10 mg by mouth daily. Not sure of exact dose    . ONDANSETRON HCL 4 MG PO TABS Oral Take 4 mg by mouth daily as needed. For nausea    . PARICALCITOL 1 MCG PO CAPS Oral Take 1 mcg by mouth every Monday, Wednesday, and Friday.    Marland Kitchen POTASSIUM CHLORIDE CRYS ER 10 MEQ PO TBCR Oral Take  10 mEq by mouth daily.    . SERTRALINE HCL 25 MG PO TABS Oral Take 25 mg by mouth at bedtime.      BP 132/69  Pulse 63  Resp 20  SpO2 99%  Physical Exam  Nursing note and vitals reviewed. Constitutional: She is oriented to person, place, and time. She appears well-developed and well-nourished. No distress.  HENT:  Head: Normocephalic and atraumatic.  Eyes: Conjunctivae are normal.  Neck: Normal range of motion. Neck supple.  Cardiovascular: Normal rate, regular rhythm, normal heart sounds and intact distal pulses.  Exam reveals no gallop and no friction rub.   No murmur heard. Pulmonary/Chest: Effort normal and breath sounds normal. No respiratory distress. She has no wheezes. She has no rales. She exhibits no tenderness.  Abdominal: Soft. Bowel sounds are normal. She exhibits distension. She exhibits no mass. There is tenderness. There is no rebound and no guarding.       TTP of LLQ with guarding but no rigidity.   Musculoskeletal: Normal range of motion. She exhibits no edema and no tenderness.  Neurological: She is alert and oriented to person, place, and time.  Skin: Skin is warm and dry. No rash noted. She is not diaphoretic. No erythema.  Psychiatric: She has a normal mood and affect.    ED Course  Procedures (including critical care time)  Labs Reviewed  CBC - Abnormal; Notable for the following:    Hemoglobin 11.4 (*)    HCT 35.5 (*)    All other components within normal limits  DIFFERENTIAL - Abnormal; Notable for the following:    Neutrophils Relative 78 (*)    All other components within normal limits  BASIC METABOLIC PANEL - Abnormal; Notable for the following:    Potassium 3.4 (*)    Glucose, Bld 144 (*)    Calcium 8.1 (*)    GFR calc non Af Amer 47 (*)    GFR calc Af Amer 54 (*)    All other components within normal limits  URINALYSIS, ROUTINE W REFLEX MICROSCOPIC - Abnormal; Notable for the following:    APPearance CLOUDY (*)    Hgb urine dipstick TRACE  (*)    Bilirubin Urine SMALL (*)    Protein, ur 30 (*)    Leukocytes, UA MODERATE (*)    All other components within normal limits  URINE MICROSCOPIC-ADD ON - Abnormal; Notable for the following:    Casts HYALINE CASTS (*)    All other components  within normal limits   No results found.   No diagnosis found.    MDM  Sign out to Dr. Norlene Campbell with CT scan pending. UTI with abx IV started. dispo pending CT scan. VSS        Lenon Oms [place], [place] 08/01/11 [phone]

## 2011-08-01 ENCOUNTER — Encounter (HOSPITAL_COMMUNITY): Payer: Self-pay | Admitting: Radiology

## 2011-08-01 ENCOUNTER — Other Ambulatory Visit: Payer: Self-pay

## 2011-08-01 ENCOUNTER — Emergency Department (HOSPITAL_COMMUNITY): Payer: Medicare Other

## 2011-08-01 MED ORDER — CIPROFLOXACIN IN D5W 400 MG/200ML IV SOLN
400.0000 mg | Freq: Once | INTRAVENOUS | Status: AC
  Administered 2011-08-01: 400 mg via INTRAVENOUS
  Filled 2011-08-01: qty 200

## 2011-08-01 MED ORDER — IOHEXOL 300 MG/ML  SOLN: 100.0000 mL | Freq: Once | INTRAMUSCULAR | Status: DC | PRN

## 2011-08-01 MED ORDER — FLUCONAZOLE 200 MG PO TABS: 200.0000 mg | ORAL_TABLET | Freq: Every day | ORAL | Status: AC

## 2011-08-01 MED ORDER — CIPROFLOXACIN HCL 500 MG PO TABS: 500.0000 mg | ORAL_TABLET | Freq: Two times a day (BID) | ORAL | Status: AC

## 2011-08-01 NOTE — ED Notes (Signed)
Urine culture sent as an add on to main lab.

## 2011-08-01 NOTE — ED Provider Notes (Signed)
Medical screening examination/treatment/procedure(s) were conducted as a shared visit with non-physician practitioner(s) and myself.  I personally evaluated the patient during the encounter. Patient with generalized weakness for 5 days, recently treated for urinary tract infection pending antibiotics 5 days ago. Patient with lower abdominal and back pain tonight. Urine shows probable infection. Blood work unremarkable CT scan unchanged from prior. Discuss with patient and family for treatment of urinary tract infection and close followup with primary care Dr. which they agree with  Olivia Mackie, MD 08/01/11 [phone]

## 2011-08-01 NOTE — Discharge Instructions (Signed)
Please take antibiotics as prescribed. Followup with your Dr. for recheck in 3-5 days. Return to the emergency department for worsening condition or new concerning symptoms.  Urinary Tract Infection Infections of the urinary tract can start in several places. A bladder infection (cystitis), a kidney infection (pyelonephritis), and a prostate infection (prostatitis) are different types of urinary tract infections (UTIs). They usually get better if treated with medicines (antibiotics) that kill germs. Take all the medicine until it is gone. You or your child may feel better in a few days, but TAKE ALL MEDICINE or the infection may not respond and may become more difficult to treat. HOME CARE INSTRUCTIONS   Drink enough water and fluids to keep the urine clear or pale yellow. Cranberry juice is especially recommended, in addition to large amounts of water.   Avoid caffeine, tea, and carbonated beverages. They tend to irritate the bladder.   Alcohol may irritate the prostate.   Only take over-the-counter or prescription medicines for pain, discomfort, or fever as directed by your caregiver.  To prevent further infections:  Empty the bladder often. Avoid holding urine for long periods of time.   After a bowel movement, women should cleanse from front to back. Use each tissue only once.   Empty the bladder before and after sexual intercourse.  FINDING OUT THE RESULTS OF YOUR TEST Not all test results are available during your visit. If your or your child's test results are not back during the visit, make an appointment with your caregiver to find out the results. Do not assume everything is normal if you have not heard from your caregiver or the medical facility. It is important for you to follow up on all test results. SEEK MEDICAL CARE IF:   There is back pain.   Your baby is older than 3 months with a rectal temperature of 100.5 F (38.1 C) or higher for more than 1 day.   Your or your  child's problems (symptoms) are no better in 3 days. Return sooner if you or your child is getting worse.  SEEK IMMEDIATE MEDICAL CARE IF:   There is severe back pain or lower abdominal pain.   You or your child develops chills.   You have a fever.   Your baby is older than 3 months with a rectal temperature of 102 F (38.9 C) or higher.   Your baby is 50 months old or younger with a rectal temperature of 100.4 F (38 C) or higher.   There is nausea or vomiting.   There is continued burning or discomfort with urination.  MAKE SURE YOU:   Understand these instructions.   Will watch your condition.   Will get help right away if you are not doing well or get worse.  Document Released: 01/23/2005 Document Revised: 04/04/2011 Document Reviewed: 08/28/2006 [facility] Patient Information 2012 [place], [place].

## 2011-08-02 LAB — URINE CULTURE: Culture  Setup Time: 201304040832

## 2011-08-03 NOTE — ED Notes (Signed)
+  Urine. Patient given Cipro. No sensitivity listed. Chart sent to EDP office for review. °

## 2011-08-05 NOTE — ED Notes (Signed)
Chart back from EDP office... Rx for Keflex called in to [facility] [phone] by Steward Ros RN

## 2011-08-12 ENCOUNTER — Encounter: Payer: Medicare Other | Admitting: Internal Medicine

## 2011-08-12 ENCOUNTER — Observation Stay (HOSPITAL_COMMUNITY)
Admission: AD | Admit: 2011-08-12 | Discharge: 2011-08-15 | Disposition: A | Discharge status: Home / Self Care | Payer: Medicare Other | Source: Ambulatory Visit | Attending: Internal Medicine | Admitting: Internal Medicine

## 2011-08-12 ENCOUNTER — Telehealth: Payer: Self-pay | Admitting: *Deleted

## 2011-08-12 ENCOUNTER — Ambulatory Visit (INDEPENDENT_AMBULATORY_CARE_PROVIDER_SITE_OTHER): Payer: Medicare Other | Admitting: Internal Medicine

## 2011-08-12 ENCOUNTER — Encounter (HOSPITAL_COMMUNITY): Payer: Self-pay | Admitting: Internal Medicine

## 2011-08-12 ENCOUNTER — Inpatient Hospital Stay (HOSPITAL_COMMUNITY): Payer: Medicare Other

## 2011-08-12 VITALS — BP 76/56 | HR 75 | Temp 97.1°F | Wt 126.2 lb

## 2011-08-12 DIAGNOSIS — E2740 Unspecified adrenocortical insufficiency: Secondary | ICD-10-CM | POA: Diagnosis present

## 2011-08-12 DIAGNOSIS — R109 Unspecified abdominal pain: Secondary | ICD-10-CM | POA: Insufficient documentation

## 2011-08-12 DIAGNOSIS — R413 Other amnesia: Secondary | ICD-10-CM | POA: Diagnosis present

## 2011-08-12 DIAGNOSIS — D649 Anemia, unspecified: Secondary | ICD-10-CM | POA: Insufficient documentation

## 2011-08-12 DIAGNOSIS — Z96659 Presence of unspecified artificial knee joint: Secondary | ICD-10-CM | POA: Insufficient documentation

## 2011-08-12 DIAGNOSIS — K9041 Non-celiac gluten sensitivity: Secondary | ICD-10-CM

## 2011-08-12 DIAGNOSIS — E860 Dehydration: Secondary | ICD-10-CM

## 2011-08-12 DIAGNOSIS — R5381 Other malaise: Secondary | ICD-10-CM | POA: Insufficient documentation

## 2011-08-12 DIAGNOSIS — I959 Hypotension, unspecified: Secondary | ICD-10-CM | POA: Insufficient documentation

## 2011-08-12 DIAGNOSIS — G3184 Mild cognitive impairment, so stated: Secondary | ICD-10-CM | POA: Insufficient documentation

## 2011-08-12 DIAGNOSIS — E2749 Other adrenocortical insufficiency: Secondary | ICD-10-CM | POA: Insufficient documentation

## 2011-08-12 DIAGNOSIS — R0989 Other specified symptoms and signs involving the circulatory and respiratory systems: Secondary | ICD-10-CM | POA: Insufficient documentation

## 2011-08-12 DIAGNOSIS — R0609 Other forms of dyspnea: Secondary | ICD-10-CM | POA: Insufficient documentation

## 2011-08-12 DIAGNOSIS — R197 Diarrhea, unspecified: Secondary | ICD-10-CM | POA: Insufficient documentation

## 2011-08-12 DIAGNOSIS — D472 Monoclonal gammopathy: Secondary | ICD-10-CM | POA: Insufficient documentation

## 2011-08-12 DIAGNOSIS — R627 Adult failure to thrive: Principal | ICD-10-CM | POA: Insufficient documentation

## 2011-08-12 DIAGNOSIS — M48061 Spinal stenosis, lumbar region without neurogenic claudication: Secondary | ICD-10-CM | POA: Insufficient documentation

## 2011-08-12 HISTORY — DX: Other adrenocortical insufficiency: E27.49

## 2011-08-12 HISTORY — DX: Non-celiac gluten sensitivity: K90.41

## 2011-08-12 LAB — COMPREHENSIVE METABOLIC PANEL
ALT: 7 U/L (ref 0–35)
AST: 9 U/L (ref 0–37)
Albumin: 2.2 g/dL — ABNORMAL LOW (ref 3.5–5.2)
Alkaline Phosphatase: 86 U/L (ref 39–117)
CO2: 28 mEq/L (ref 19–32)
Chloride: 109 mEq/L (ref 96–112)
Creatinine, Ser: 1.01 mg/dL (ref 0.50–1.10)
GFR calc Af Amer: 58 mL/min — ABNORMAL LOW (ref >90)
GFR calc non Af Amer: 50 mL/min — ABNORMAL LOW (ref >90)
Glucose, Bld: 87 mg/dL (ref 70–99)
Sodium: 145 mEq/L (ref 135–145)
Total Bilirubin: 0.1 mg/dL — ABNORMAL LOW (ref 0.3–1.2)

## 2011-08-12 LAB — CBC
MCH: 28.9 pg (ref 26.0–34.0)
MCHC: 32.7 g/dL (ref 30.0–36.0)
MCV: 88.4 fL (ref 78.0–100.0)
Platelets: 176 10*3/uL (ref 150–400)
RDW: 13.9 % (ref 11.5–15.5)
WBC: 4.9 10*3/uL (ref 4.0–10.5)

## 2011-08-12 LAB — DIFFERENTIAL
Basophils Absolute: 0 10*3/uL (ref 0.0–0.1)
Basophils Relative: 0 % (ref 0–1)
Eosinophils Absolute: 0.1 10*3/uL (ref 0.0–0.7)
Eosinophils Relative: 1 % (ref 0–5)
Neutro Abs: 2.9 10*3/uL (ref 1.7–7.7)

## 2011-08-12 LAB — TSH: TSH: 2.276 u[IU]/mL (ref 0.350–4.500)

## 2011-08-12 LAB — VITAMIN B12: Vitamin B-12: 331 pg/mL (ref 211–911)

## 2011-08-12 LAB — MAGNESIUM: Magnesium: 1.9 mg/dL (ref 1.5–2.5)

## 2011-08-12 MED ORDER — SODIUM CHLORIDE 0.9 % IV SOLN
INTRAVENOUS | Status: DC
  Administered 2011-08-12: 18:00:00 via INTRAVENOUS
  Administered 2011-08-13 – 2011-08-14 (×2): 125 mL/h via INTRAVENOUS

## 2011-08-12 MED ORDER — ASPIRIN 81 MG PO TABS: 81.0000 mg | ORAL_TABLET | Freq: Every day | ORAL | Status: DC

## 2011-08-12 MED ORDER — ACETAMINOPHEN 650 MG RE SUPP: 650.0000 mg | Freq: Four times a day (QID) | RECTAL | Status: DC | PRN

## 2011-08-12 MED ORDER — ALBUTEROL SULFATE (5 MG/ML) 0.5% IN NEBU: 2.5000 mg | INHALATION_SOLUTION | RESPIRATORY_TRACT | Status: DC | PRN

## 2011-08-12 MED ORDER — ASPIRIN EC 81 MG PO TBEC
81.0000 mg | DELAYED_RELEASE_TABLET | Freq: Every day | ORAL | Status: DC
  Administered 2011-08-12 – 2011-08-15 (×4): 81 mg via ORAL
  Filled 2011-08-12 (×4): qty 1

## 2011-08-12 MED ORDER — IPRATROPIUM BROMIDE 0.02 % IN SOLN: 0.5000 mg | RESPIRATORY_TRACT | Status: DC | PRN

## 2011-08-12 MED ORDER — ACETAMINOPHEN 325 MG PO TABS: 650.0000 mg | ORAL_TABLET | Freq: Four times a day (QID) | ORAL | Status: DC | PRN

## 2011-08-12 MED ORDER — ENOXAPARIN SODIUM 30 MG/0.3ML ~~LOC~~ SOLN
30.0000 mg | SUBCUTANEOUS | Status: DC
  Administered 2011-08-12 – 2011-08-14 (×3): 30 mg via SUBCUTANEOUS
  Filled 2011-08-12 (×4): qty 0.3

## 2011-08-12 MED ORDER — POTASSIUM CHLORIDE CRYS ER 20 MEQ PO TBCR
40.0000 meq | EXTENDED_RELEASE_TABLET | ORAL | Status: AC
  Administered 2011-08-12 – 2011-08-13 (×2): 40 meq via ORAL
  Filled 2011-08-12 (×3): qty 2

## 2011-08-12 MED ORDER — ALUM & MAG HYDROXIDE-SIMETH 200-200-20 MG/5ML PO SUSP: 30.0000 mL | Freq: Four times a day (QID) | ORAL | Status: DC | PRN

## 2011-08-12 MED ORDER — HYDROCORTISONE 20 MG PO TABS
20.0000 mg | ORAL_TABLET | Freq: Two times a day (BID) | ORAL | Status: DC
  Administered 2011-08-12 – 2011-08-14 (×4): 20 mg via ORAL
  Filled 2011-08-12 (×6): qty 1

## 2011-08-12 MED ORDER — PARICALCITOL 1 MCG PO CAPS
1.0000 ug | ORAL_CAPSULE | ORAL | Status: DC
  Administered 2011-08-14: 1 ug via ORAL
  Filled 2011-08-12: qty 1

## 2011-08-12 MED ORDER — ONDANSETRON HCL 4 MG PO TABS: 4.0000 mg | ORAL_TABLET | Freq: Four times a day (QID) | ORAL | Status: DC | PRN

## 2011-08-12 MED ORDER — MIRTAZAPINE 15 MG PO TABS
15.0000 mg | ORAL_TABLET | Freq: Every day | ORAL | Status: DC
  Administered 2011-08-12 – 2011-08-14 (×3): 15 mg via ORAL
  Filled 2011-08-12 (×4): qty 1

## 2011-08-12 MED ORDER — SERTRALINE HCL 25 MG PO TABS
25.0000 mg | ORAL_TABLET | Freq: Every day | ORAL | Status: DC
  Administered 2011-08-12 – 2011-08-14 (×3): 25 mg via ORAL
  Filled 2011-08-12 (×4): qty 1

## 2011-08-12 MED ORDER — ONDANSETRON HCL 4 MG/2ML IJ SOLN: 4.0000 mg | Freq: Four times a day (QID) | INTRAMUSCULAR | Status: DC | PRN

## 2011-08-12 MED ORDER — SODIUM CHLORIDE 0.9 % IV SOLN: INTRAVENOUS | Status: DC

## 2011-08-12 MED ORDER — FAMOTIDINE 40 MG PO TABS
40.0000 mg | ORAL_TABLET | Freq: Every day | ORAL | Status: DC
  Administered 2011-08-13 – 2011-08-15 (×3): 40 mg via ORAL
  Filled 2011-08-12 (×3): qty 1

## 2011-08-12 NOTE — Telephone Encounter (Signed)
Daughter called for past week not sleeping and has productive cough - not sure of color. Recently went to ER for UTI - on antibiotics. Appt made today 08/12/11 2:45PM Dr Denton Meek. [name] Shulamis Wenberg RN 08/12/11 11:45AM

## 2011-08-12 NOTE — H&P (Signed)
Hospital Admission Note Date: 08/12/2011  Patient name: Rachel Bridges Medical record number: [id number] Date of birth: 1924/06/28 Age: 78 y.o. Gender: female PCP: Lars Mage, MD, MD  Medical Service: Int Med  Attending physician:  Rogelia Boga   Pager: Resident (R2/R3): [place]    Pager: [phone] Resident (R1): Berlinda Last   [phone]  Chief Complaint: diarrhea, weakness  History of Present Illness: Patient is a very pleasant 78 year old woman with history of MGUS, celiac disease, anxiety, CKD, and adrenal insufficiency who presents with generalized weakness. Patient describes feeling very poorly for the past 2 weeks including diarrhea, decreased by mouth intake, cough, and a UTI that was diagnosed in the ED on 4/3. She was started on Bactrim and then switched to Keflex.  She describes dizziness when sitting up or standing.  She reports less urination than usual over the same duration. Denies any chest pain, but does endorse some diffuse abdominal pain and a seatbelt distribution.  Patient's daughter also reports a decrease in memory over the past 2-4 weeks, with the patient frequently repeating questions about timing of the days events or about things that have happened recently.   Past Medical History: Past Medical History  Diagnosis Date  . MGUS (monoclonal gammopathy of unknown significance)      [facility]  . Anxiety   . History of colon cancer      History of bowel  obstruction. on treatment with Colace and MiraLax.  followed by Dr. Loreta Ave and Dr. Purnell Shoemaker  . Depression   . Diverticulosis of colon   . Osteopenia   . Lumbar spinal stenosis   . Atrophic vaginitis   . Renal cyst   . History of tobacco abuse   . Dermatitis   . Hip pain      History  . Actinic keratosis   . Dementia   . Adrenal suppression     central, on chronic steroids, followed by Dr. Sharl Ma of endocrinology   Past Surgical History  Procedure Date  . Hemicolectomy    Right-sided  .  resection of ovarian cyst   . Cataract extraction      Left eye  . Knee arthroscopy      Left knee  . Appendectomy   . Carpal tunnel release   . Lumbar laminectomy   . Lumbar fusion   . Replacement total knee     left knee    Meds: Prior to Admission medications   Medication Sig Start Date End Date Taking? Authorizing Provider  aspirin 81 MG tablet Take 81 mg by mouth daily.     Historical Provider, MD  calcium citrate-vitamin D (CITRACAL+D) 315-200 MG-UNIT per tablet Take 1 tablet by mouth 2 (two) times daily. 02/11/11 02/11/12  Lars Mage, MD  ciprofloxacin (CIPRO) 500 MG tablet Take 1 tablet (500 mg total) by mouth every 12 (twelve) hours. 08/01/11 08/11/11  Olivia Mackie, MD  famotidine (PEPCID) 40 MG tablet Take 40 mg by mouth daily. 03/09/11 03/08/12  Daryel Gerald, MD  ferrous fumarate (HEMOCYTE - 106 MG FE) 325 (106 FE) MG TABS Take 1 tablet by mouth 3 (three) times daily. 03/09/11   Daryel Gerald, MD  furosemide (LASIX) 40 MG tablet Take 40 mg by mouth 2 (two) times daily.    Historical Provider, MD  hydrocortisone (CORTEF) 5 MG tablet Take 10 mg by mouth daily. Not sure of exact dose    Historical Provider, MD  ondansetron (ZOFRAN) 4 MG tablet Take 4  mg by mouth daily as needed. For nausea 05/27/11 05/26/12  Daryel Gerald, MD  paricalcitol [facility]) 1 MCG capsule Take 1 mcg by mouth every Monday, Wednesday, and Friday.    Historical Provider, MD  potassium chloride (K-DUR,KLOR-CON) 10 MEQ tablet Take 10 mEq by mouth daily. 05/10/11   Daryel Gerald, MD  sertraline (ZOLOFT) 25 MG tablet Take 25 mg by mouth at bedtime. 05/27/11   Daryel Gerald, MD    Allergies: Morphine and related; Oxycodone hcl; and Pantoprazole  Family Hx: No family history on file.  Social Hx: History   Social History  . Marital Status: Widowed    Spouse Name: N/A    Number of Children: N/A  . Years of Education: N/A    Occupational History  . Not on file.   Social History Main Topics  . Smoking status: Former Smoker    Quit date: 08/01/1988  . Smokeless tobacco: Not on file  . Alcohol Use: No  . Drug Use: No  . Sexually Active: Not on file   Other Topics Concern  . Not on file   Social History Narrative    Husband died of myeloma. Patient is to daughter's one of whom she lives with her.    Review of Systems: As per hpi  Physical Exam: 97.1     76/56    75 GEN: No apparent distress, pale.  Alert and oriented x 3.  Pleasant, conversant, and cooperative to exam. HEENT: head is autraumatic and normocephalic. EOMI.  PERRLA.  Sclerae anicteric. Mucous membranes are moist.  Oropharynx is without erythema, exudates, or other abnormal lesions. RESP:  Lungs are clear to ascultation bilaterally with good air movement.  Mild end expiratory wheezes in diffuse lung fields. CARDIOVASCULAR: regular rate, normal rhythm.  Clear S1, S2, no murmurs, gallops, or rubs. ABDOMEN: soft, non-tender, non-distended.  Bowels sounds present in all quadrants and normoactive.  No palpable masses. EXT: warm and dry.  Peripheral pulses equal, intact, and +2 globally.  No clubbing or cyanosis.  No edema in b/l lower extremeties SKIN: pale.  Bruises from IV attempts over past weeks. NEURO: CN II-XII grossly intact.  Muscle strength +5/5 in bilateral upper and lower extremities.  Sensation is grossly intact.  No focal deficit.   Lab results:        Assessment & Plan by Problem: # Hypotension Patient has blood pressure of 70/50 in the setting of diarrheal illness and possible UTI. No tachycardia or fever. Does not meets Sirs criteria. Is orthostatic on exam (by symptoms and numbers). Likely secondary to decreased oral intake as well as component of adrenal insufficiency in the setting of acute illness, as patient is on chronic steroid supplementation. Has been taking double dose per her daughter, but we do not know the  exact number. - Hydrocortisone 20mg  twice a day, consider stress dose steroids if does poorly overnight - Call Dr. Sharl Ma to confirm most up-to-date dose - IV Fluids  # Failure to Thrive Patient feeling poorly over the past few weeks, likely secondary to diarrheal illness, decreased oral intake, and chronic cough. Could also be related to urinary tract infection that she has ongoing treatment for. Could also be related to chronic anemia. Does have some wheezes on exam, though no rhonchi. - CBC with differential - CMP - UA - Urine culture - CXR, two-view - nebs  # Diarrhea Reported diarrhea over the past week and half, but no bowel movements over the past 2 days. Differential for  her diarrhea includes viral, bacterial, or antibiotic related, though it may be resolving. Patient does appear pale on exam and is orthostatic by symptoms. Did have CT scan of the abdomen/pelvis on 07/31/11 that was relatively stable when compared to prior. - IV fluids, NS at 125 - Check orthostatics  # UTI Patient was seen in the ED on 07/31/11 and had a urine culture that showed greater than 100,000 colonies of group B strep. She had already been on antibiotics at that time for a UTI, but we are not sure where that was diagnosed. It is possible that that was through her nephrologist. Regardless, the patient has been taking Cipro and then Keflex, with a few more days left in the course. - UA - urine culture  # Memory Impairment Patient's daughter complained of 2-4 weeks of memory loss, with patient asking repeat questions about timing of events during a day. Mini-cog exam reveals 3 out of 3 word recall but some difficulty attending to initially remember the 3 words. Clock draw was significant for putting digits on the right hand of the clock only. The meaning of these results is unclear in the setting of acute/subacute illness, nonfocal neuro exam, and can be reevaluated and after some fluid resuscitation and some  medicines. - TSH - B12 - Repeat mini cog in the next day or 2  # Ppx - lovenox

## 2011-08-12 NOTE — Progress Notes (Signed)
Patient ID: Rachel Bridges, female   DOB: 1924-07-29, 78 y.o.   MRN: [id number] HPI:    78 y/o pleasant woman with   MGUS, Celiac disease, anxiety disease and adrenal insufficiency, brought in by her daughter for a failure to thrive and generalized weakness.  Patient's daughter states that Rachel Bridges was in a normal state of health up until  8 days ago when developed  Diarrhea which she attributed to the restaurant meal eaten 1 day prior to the onset of her Sx. She denies any fever, chills, N/V, new rash, abdominal pain or dysuria; hematuria, melena, BRBP. She denies any similar sx in the past. Off note, she has  A recent Dx of UTI  Made in ED on 07/31/2011 and was treated with ?Cipro.   Patient's daughter is "nervous" to take her home and would like her to be admitted to the hospital for further evaluation and management.  Review of Systems: Negative except per history of present illness  Physical Exam:  Nursing notes and vitals reviewed; Patient is orthostatic by BP measurements. General:  alert, frail -appearing and cooperative to examination.   Skin: maculopapular erythematous rash to left submandibular and chin area -"from nervous ticks, per daughter. Lungs:  normal respiratory effort, no accessory muscle use, normal breath sounds, no crackles, and no wheezes. Heart:  normal rate, regular rhythm, no murmurs,  and no rub.   Abdomen:  soft, mild TTP in a suprapubic area, normal bowel sounds, no distention, no guarding, no rebound tenderness, no hepatomegaly, and no splenomegaly.   Extremities:  No cyanosis, clubbing, edema; mu;ltiple ecchymotic lesions to right UE -"from and IV line placement in ED earlier this month "per daughter. Neurologic:  alert & oriented X3, nonfocal exam  Meds: Medications Prior to Admission  Medication Sig Dispense Refill  . aspirin 81 MG tablet Take 81 mg by mouth daily.       . calcium citrate-vitamin D (CITRACAL+D) 315-200 MG-UNIT per tablet Take 1 tablet by  mouth 2 (two) times daily.      . ciprofloxacin (CIPRO) 500 MG tablet Take 1 tablet (500 mg total) by mouth every 12 (twelve) hours.  14 tablet  0  . famotidine (PEPCID) 40 MG tablet Take 40 mg by mouth daily.      . ferrous fumarate (HEMOCYTE - 106 MG FE) 325 (106 FE) MG TABS Take 1 tablet by mouth 3 (three) times daily.      . furosemide (LASIX) 40 MG tablet Take 40 mg by mouth 2 (two) times daily.      . hydrocortisone (CORTEF) 5 MG tablet Take 10 mg by mouth daily. Not sure of exact dose      . ondansetron (ZOFRAN) 4 MG tablet Take 4 mg by mouth daily as needed. For nausea      . paricalcitol (ZEMPLAR) 1 MCG capsule Take 1 mcg by mouth every Monday, Wednesday, and Friday.      . potassium chloride (K-DUR,KLOR-CON) 10 MEQ tablet Take 10 mEq by mouth daily.      . sertraline (ZOLOFT) 25 MG tablet Take 25 mg by mouth at bedtime.       No current facility-administered medications on file as of 08/12/2011.    Allergies: Morphine and related; Oxycodone hcl; and Pantoprazole Past Medical History  Diagnosis Date  . MGUS (monoclonal gammopathy of unknown significance)      [facility]  . Anxiety   . History of colon cancer  History of bowel  obstruction. on treatment with Colace and MiraLax.  followed by Dr. Loreta Ave and Dr. Purnell Shoemaker  . Depression   . Diverticulosis of colon   . Osteopenia   . Cellulitis      left elbow  . Lumbar spinal stenosis   . Atrophic vaginitis   . Renal cyst   . History of tobacco abuse   . Dermatitis   . Hip pain      History  . Actinic keratosis   . Dementia    Past Surgical History  Procedure Date  . Hemicolectomy      Right-sided  .  resection of ovarian cyst   . Cataract extraction      Left eye  . Knee arthroscopy      Left knee  . Appendectomy   . Carpal tunnel release   . Lumbar laminectomy   . Lumbar fusion   . Replacement total knee     left knee   No family history on file. History   Social  History  . Marital Status: Widowed    Spouse Name: N/A    Number of Children: N/A  . Years of Education: N/A   Occupational History  . Not on file.   Social History Main Topics  . Smoking status: Former Smoker    Quit date: 08/01/1988  . Smokeless tobacco: Not on file  . Alcohol Use: No  . Drug Use: No  . Sexually Active: Not on file   Other Topics Concern  . Not on file   Social History Narrative    Husband died of myeloma. Patient is to daughter's one of whom she lives with her.    A/P: 1. Dehydration/Failure to thrive  in a setting of recent UTI. -patient is hypothensive with orthostasis. -D/W Attending Dr. Aundria Rud -Will admit to a medical floor for a resuscitation with IVF and evaluation for an infection and/or electrolyte abnormalities. - On-call team (Dr. Tonny Branch) were notified.

## 2011-08-12 NOTE — Progress Notes (Addendum)
Received pt. As direct admitt from [facility] clinic.pt. Is from home with her daughter.pt. Uses cane and a walker at home.pt skin is dry and with out pressure sore.keep monitoring pt. Closely and assessing her needs. Pt has a skin tear on left wrist. Open to air

## 2011-08-12 NOTE — Progress Notes (Signed)
Attempted to start IV X2, 1 try in each hand.  Unable to thread Angiocath.  DSD applied and IV team paged

## 2011-08-13 LAB — BASIC METABOLIC PANEL
BUN: 16 mg/dL (ref 6–23)
Chloride: 112 mEq/L (ref 96–112)
Creatinine, Ser: 0.96 mg/dL (ref 0.50–1.10)
GFR calc Af Amer: 61 mL/min — ABNORMAL LOW (ref >90)
GFR calc non Af Amer: 53 mL/min — ABNORMAL LOW (ref >90)
Sodium: 144 mEq/L (ref 135–145)

## 2011-08-13 LAB — URINALYSIS, ROUTINE W REFLEX MICROSCOPIC
Bilirubin Urine: NEGATIVE
Hgb urine dipstick: NEGATIVE
Ketones, ur: NEGATIVE mg/dL
Protein, ur: NEGATIVE mg/dL
Specific Gravity, Urine: 1.021 (ref 1.005–1.030)
Urobilinogen, UA: 0.2 mg/dL (ref 0.0–1.0)

## 2011-08-13 LAB — URINE CULTURE: Culture: NO GROWTH

## 2011-08-13 MED ORDER — IPRATROPIUM BROMIDE 0.02 % IN SOLN
0.5000 mg | Freq: Two times a day (BID) | RESPIRATORY_TRACT | Status: DC
  Administered 2011-08-13 – 2011-08-15 (×4): 0.5 mg via RESPIRATORY_TRACT
  Filled 2011-08-13 (×4): qty 2.5

## 2011-08-13 MED ORDER — ALBUTEROL SULFATE (5 MG/ML) 0.5% IN NEBU
2.5000 mg | INHALATION_SOLUTION | Freq: Two times a day (BID) | RESPIRATORY_TRACT | Status: DC
  Administered 2011-08-13 – 2011-08-15 (×4): 2.5 mg via RESPIRATORY_TRACT
  Filled 2011-08-13 (×4): qty 0.5

## 2011-08-13 MED ORDER — IPRATROPIUM BROMIDE 0.02 % IN SOLN
0.5000 mg | Freq: Four times a day (QID) | RESPIRATORY_TRACT | Status: DC
  Administered 2011-08-13: 0.5 mg via RESPIRATORY_TRACT
  Filled 2011-08-13: qty 2.5

## 2011-08-13 MED ORDER — ALBUTEROL SULFATE (5 MG/ML) 0.5% IN NEBU: 2.5000 mg | INHALATION_SOLUTION | Freq: Two times a day (BID) | RESPIRATORY_TRACT | Status: DC

## 2011-08-13 MED ORDER — CYANOCOBALAMIN 1000 MCG/ML IJ SOLN
1000.0000 ug | Freq: Every day | INTRAMUSCULAR | Status: DC
  Administered 2011-08-13 – 2011-08-15 (×3): 1000 ug via INTRAMUSCULAR
  Filled 2011-08-13 (×3): qty 1

## 2011-08-13 MED ORDER — IPRATROPIUM BROMIDE 0.02 % IN SOLN: 0.5000 mg | Freq: Four times a day (QID) | RESPIRATORY_TRACT | Status: DC

## 2011-08-13 MED ORDER — ALBUTEROL SULFATE (5 MG/ML) 0.5% IN NEBU
2.5000 mg | INHALATION_SOLUTION | Freq: Four times a day (QID) | RESPIRATORY_TRACT | Status: DC
  Administered 2011-08-13: 2.5 mg via RESPIRATORY_TRACT
  Filled 2011-08-13: qty 0.5

## 2011-08-13 NOTE — Evaluation (Signed)
Physical Therapy Evaluation Patient Details Name: Rachel Bridges MRN: [id number] DOB: Apr 04, 1925 Today's Date: 08/13/2011  Problem List:  Patient Active Problem List  Diagnoses  . VITAMIN B12 DEFICIENCY  . HYPERLIPIDEMIA  . MONOCLONAL GAMMOPATHY  . ANXIETY  . DEPRESSION  . GERD  . CELIAC DISEASE  . DERMATITIS, SEBORRHEIC  . PSORIASIS  . GAIT IMBALANCE  . Abdominal pain  . Adult failure to thrive  . Normocytic anemia  . Acute on chronic renal failure  . Fatigue  . Diarrhea  . Adrenal insufficiency  . Peripheral edema  . Memory impairment    Past Medical History:  Past Medical History  Diagnosis Date  . MGUS (monoclonal gammopathy of unknown significance)      [facility]  . Anxiety   . History of colon cancer      History of bowel  obstruction. on treatment with Colace and MiraLax.  followed by Dr. Loreta Ave and Dr. Purnell Shoemaker  . Depression   . Diverticulosis of colon   . Osteopenia   . Lumbar spinal stenosis   . Atrophic vaginitis   . Renal cyst   . History of tobacco abuse   . Dermatitis   . Hip pain      History  . Actinic keratosis   . Dementia   . Adrenal suppression     central, on chronic steroids, followed by Dr. Sharl Ma of endocrinology   Past Surgical History:  Past Surgical History  Procedure Date  . Hemicolectomy      Right-sided  .  resection of ovarian cyst   . Cataract extraction      Left eye  . Knee arthroscopy      Left knee  . Appendectomy   . Carpal tunnel release   . Lumbar laminectomy   . Lumbar fusion   . Replacement total knee     left knee    PT Assessment/Plan/Recommendation PT Assessment Clinical Impression Statement: Pt presents with generalized weakness and failure to thrive with history of celiac disease, chronic kidney disease and adrenal insufficiency.  Pt agreed to ambulate only outside of door and back.  Noted decreased balance with forward flexed posture with RW too far ahead of pt.   Pt states that someone is always with her and that she does not want to receive home health PT at D/C.  PT continued to recommend HHPT for pt to decrease fall risk.   PT Recommendation/Assessment: Patient will need skilled PT in the acute care venue PT Problem List: Decreased strength;Decreased activity tolerance;Decreased balance;Decreased mobility;Decreased coordination;Decreased cognition;Decreased knowledge of use of DME;Decreased safety awareness Barriers to Discharge Comments: Unsure of caregiver support.  PT Therapy Diagnosis : Difficulty walking;Abnormality of gait;Generalized weakness PT Plan PT Frequency: Min 3X/week PT Treatment/Interventions: DME instruction;Gait training;Stair training;Functional mobility training;Therapeutic activities;Therapeutic exercise;Balance training PT Recommendation Recommendations for Other Services: OT consult Follow Up Recommendations: Home health PT;Supervision/Assistance - 24 hour Equipment Recommended: None recommended by PT PT Goals  Acute Rehab PT Goals PT Goal Formulation: With patient Time For Goal Achievement: 2 weeks Pt will go Supine/Side to Sit: with modified independence PT Goal: Supine/Side to Sit - Progress: Goal set today Pt will go Sit to Supine/Side: with modified independence PT Goal: Sit to Supine/Side - Progress: Goal set today Pt will go Sit to Stand: with supervision PT Goal: Sit to Stand - Progress: Goal set today Pt will go Stand to Sit: with supervision PT Goal: Stand to Sit - Progress: Goal  set today Pt will Ambulate: 51 - 150 feet;with supervision;with least restrictive assistive device PT Goal: Ambulate - Progress: Goal set today Pt will Go Up / Down Stairs: 3-5 stairs;with supervision;with least restrictive assistive device PT Goal: Up/Down Stairs - Progress: Goal set today Pt will Perform Home Exercise Program: with supervision, verbal cues required/provided PT Goal: Perform Home Exercise Program - Progress: Goal  set today  PT Evaluation Precautions/Restrictions  Precautions Precautions: Fall Restrictions Weight Bearing Restrictions: No Prior Functioning  Home Living Lives With: Spouse;Daughter;Other (Comment) (son in law) Available Help at Discharge: Family;Available 24 hours/day Type of Home: House Home Access: Stairs to enter [employer] of Steps: 3 Home Layout: One level Home Adaptive Equipment: Walker - rolling Prior Function Level of Independence: Needs assistance Needs Assistance: Gait Gait Assistance: Pt states that her family was helping her ambulate at home.  Able to Take Stairs?: Yes Vocation: Retired Financial risk analyst Arousal/Alertness: Lethargic Overall Cognitive Status: History of cognitive impairments History of Cognitive Impairment:  (Pts family unavailable at time of eval to determine baseline) Orientation Level: Oriented to person;Oriented to place;Oriented to situation;Disoriented to time Sensation/Coordination Sensation Light Touch: Appears Intact Coordination Gross Motor Movements are Fluid and Coordinated: Yes Extremity Assessment RLE Assessment RLE Assessment: Exceptions to [facility] RLE Strength RLE Overall Strength Comments: hip flex 3+/5, knee flex/ext 4/5, ankle DF 4/5 LLE Assessment LLE Assessment: Exceptions to [facility] LLE Strength LLE Overall Strength Comments: hip flex 3/5, knee flex/ext 4/5, ankle DF 4/5 Mobility (including Balance) Bed Mobility Bed Mobility: Yes Supine to Sit: 4: Min assist;HOB elevated (Comment degrees) Supine to Sit Details (indicate cue type and reason): HOB elevated only slightly (approx 25).  Some assist for trunk into sitting for steadying with cues for technique/safety.  Sit to Supine: 4: Min assist;3: Mod assist;HOB elevated (comment degrees) Sit to Supine - Details (indicate cue type and reason): HOB same as above.  Min/Mod for assisting B LE into bed. Cues for technique.  Scooting to [facility]: 4: Min assist;3: Mod  assist Scooting to [facility] Details (indicate cue type and reason): Min/mod assist via pad to assist pt with scooting. Cues for LE placement/technique.  Transfers Transfers: Yes Sit to Stand: 3: Mod assist;With upper extremity assist;From bed Sit to Stand Details (indicate cue type and reason): Assist for rising to standing position with cues for hand placement on bed.  Stand to Sit: 4: Min assist;With upper extremity assist;To bed Stand to Sit Details: Cues provided for hand placement on bed for controlled assist.   Ambulation/Gait Ambulation/Gait: Yes Ambulation/Gait Assistance: 4: Min assist;3: Mod assist Ambulation/Gait Assistance Details (indicate cue type and reason): Initially required mod assist with max cuing for RW placement due to pt tendency to push RW too far ahead of her, improved somewhat with verbal and manual cuing.   Ambulation Distance (Feet): 25 Feet Assistive device: Rolling walker Gait Pattern: Step-through pattern;Decreased stride length;Shuffle;Trunk flexed Gait velocity: decreased Stairs: No Wheelchair Mobility Wheelchair Mobility: No  Posture/Postural Control Posture/Postural Control: Postural limitations Postural Limitations: rounded shoulders and forward head, esp with gait Exercise    End of Session PT - End of Session Equipment Utilized During Treatment: Gait belt Activity Tolerance: Patient limited by fatigue Patient left: in bed;with call bell in reach;with bed alarm set Nurse Communication: Mobility status for transfers;Mobility status for ambulation General Behavior During Session: [facility] for tasks performed Cognition: Impaired Cognitive Impairment: History of cognitive impairments, pt able to follow most commands  Page, Meribeth Mattes 08/13/2011, 5:33 PM

## 2011-08-13 NOTE — Progress Notes (Signed)
Subjective: Reports that she is feeling well today.  "Belly feels fine".  No diarrhea in hospital per pt.      Objective: Vital signs in last 24 hours: Filed Vitals:   08/12/11 1700 08/12/11 1900 08/12/11 2137 08/13/11 0620  BP: 149/85 124/77 137/76 112/64  Pulse:  68 64 56  Temp:  98.2 F (36.8 C) 97.6 F (36.4 C) 98.3 F (36.8 C)  TempSrc:  Oral Oral Oral  Resp:   20 22  Weight:    126 lb 15.8 oz (57.6 kg)  SpO2:   97% 92%   Physical Exam:  GEN: No apparent distress, pale. Alert and oriented x 3. Pleasant, conversant, and cooperative to exam.  HEENT: head is autraumatic and normocephalic. EOMI. PERRLA. Sclerae anicteric. Mucous membranes are moist. Oropharynx is without erythema, exudates, or other abnormal lesions.   RESP: Mild diffuse wheezing, L > R lung fields. No crackles.   CARDIOVASCULAR: regular rate, normal rhythm. Clear S1, S2, no murmurs, gallops, or rubs.  ABDOMEN: soft, non-tender.  Distention is at baseline per patient.  Bowels sounds present in all quadrants and normoactive. No palpable masses.  EXT: warm and dry. Peripheral pulses equal, intact, and +2 globally. No clubbing or cyanosis. No edema in b/l lower extremeties      Lab Results: Basic Metabolic Panel:  Lab 08/13/11 7829 08/12/11 1834  NA 144 145  K 4.1 2.9*  CL 112 109  CO2 23 28  GLUCOSE 118* 87  BUN 16 13  CREATININE 0.96 1.01  CALCIUM 8.0* 8.0*  MG -- 1.9  PHOS -- --   Liver Function Tests:  Lab 08/12/11 1834  AST 9  ALT 7  ALKPHOS 86  BILITOT 0.1*  PROT 5.6*  ALBUMIN 2.2*   CBC:  Lab 08/12/11 1834  WBC 4.9  NEUTROABS 2.9  HGB 11.4*  HCT 34.9*  MCV 88.4  PLT 176   Thyroid Function Tests:  Lab 08/12/11 1834  TSH 2.276  T4TOTAL --  FREET4 --  T3FREE --  THYROIDAB --   Anemia Panel:  Lab 08/12/11 1834  VITAMINB12 331  FOLATE --  FERRITIN --  TIBC --  IRON --  RETICCTPCT --   Urinalysis:  Lab 08/12/11 2345  COLORURINE YELLOW  LABSPEC 1.021  PHURINE  6.0  GLUCOSEU NEGATIVE  HGBUR NEGATIVE  BILIRUBINUR NEGATIVE  KETONESUR NEGATIVE  PROTEINUR NEGATIVE  UROBILINOGEN 0.2  NITRITE NEGATIVE  LEUKOCYTESUR NEGATIVE   Studies/Results: X-ray Chest Pa And Lateral   08/12/2011  *RADIOLOGY REPORT*  Clinical Data: Failure to thrive.  Wheezing.  CHEST - 2 VIEW  Comparison: 05/06/2011  Findings: Heart is enlarged.  There is prominence of interstitial markings, stable.  No focal consolidations or definite pleural effusions identified.  Colonic distension again noted.  IMPRESSION: No interval change in the appearance of the chest.  Original Report Authenticated By: Patterson Hammersmith, M.D.   Medications: I have reviewed the patient's current medications. Scheduled Meds:   . aspirin EC  81 mg Oral Daily  . cyanocobalamin  1,000 mcg Intramuscular Daily  . enoxaparin  30 mg Subcutaneous Q24H  . famotidine  40 mg Oral Daily  . hydrocortisone  20 mg Oral BID  . mirtazapine  15 mg Oral QHS  . paricalcitol  1 mcg Oral Q M,W,F  . potassium chloride  40 mEq Oral Q4H  . sertraline  25 mg Oral QHS  . DISCONTD: aspirin  81 mg Oral Daily   Continuous Infusions:   . sodium chloride 125  mL/hr (08/13/11 0101)   PRN Meds:.acetaminophen, acetaminophen, albuterol, alum & mag hydroxide-simeth, ipratropium, ondansetron (ZOFRAN) IV, ondansetron   Assessment/Plan:  # Hypotension  Normalized here on IVF and higher dose steroid.  Will recheck orthostatics today, was orthostatic on exam by symptoms and vitals on admission.  Eating well this morning.  Hypotension was likely secondary to decreased oral intake as well as component of adrenal insufficiency in the setting of acute illness, as patient is on chronic steroid supplementation. Had been taking double dose per her daughter, but we do not know the exact number.   - Hydrocortisone 20mg  twice a day, consider stress dose steroids if does poorly overnight  - Will call Dr. Daune Perch office today to confirm most  up-to-date dose  - cont NS @ 125 cc/hr   # Failure to Thrive  Patient feeling poorly over the past few weeks, likely secondary to diarrheal illness, decreased oral intake, and chronic cough. Could also be related to urinary tract infection that she has ongoing treatment for. Could also be related to chronic anemia. Does have some wheezes on exam, though no rhonchi. UA negative here, so abx stopped pending urine culture result.  Ate a good amount of her breakfast.  -Nebulizers x 2 times today for her wheezing in case this is contributing -Will supplement her low normal B12: 1000 mcg IM  Daily x 5 days, then weekly x 4 weeks, then monthly  # Diarrhea  Reported diarrhea over the past week and half, but no bowel movements over the past 2-3 days. Differential for her diarrhea includes viral, bacterial, or antibiotic related, though it may be resolving. Did have CT scan of the abdomen/pelvis on 07/31/11 that was relatively stable when compared to prior.  - IV fluids, NS at 125  - reCheck orthostatics   # UTI  Patient was seen in the ED on 07/31/11 and had a urine culture that showed greater than 100,000 colonies of group B strep. She had already been on antibiotics at that time for a UTI, but we are not sure where that was diagnosed. It is possible that that was through her nephrologist. Regardless, the patient has been taking Cipro and then Keflex, with a few more days left in the course.  - UA negative, Urine culture pending.  Abx stopped  # Memory Impairment  Patient's daughter complained of 2-4 weeks of memory loss, with patient asking repeat questions about timing of events during a day. Mini-cog exam on admission revealed 3 out of 3 word recall but some difficulty attending to initially remember the 3 words.  Today patient denies any difference in her cognition recently from her baseline. Clock draw on admission was significant for putting digits on the right hand of the clock only. Today she is  oriented to hospital and month but not year.  She registers 3/3 and on delayed recalls got 2/3 items.  TSH normal, B12 low normal.   - Will repeat mini cog before discharge once she is doing better  # Ppx  - lovenox    LOS: 1 day   Darby Fleeman, BRAD 08/13/2011, 10:41 AM

## 2011-08-13 NOTE — H&P (Signed)
Internal Medicine Teaching Service Attending Note Date: 08/13/2011  Patient name: Rachel Bridges  Medical record number: [id number]  Date of birth: [birth year]   I have seen and evaluated Rachel Bridges and discussed their care with the Residency Team. Please see Dr Danice Goltz H&P for full details. Briefly, Rachel Bridges was admitted for hypotension, weakness, fatigue, cough, diarrhea (now resolved), and anorexia. She is currently being treated with ABX for a UTI. At baseline, she is able to walk around her home but doesn't cook or do laundry. She hasn't been to the store in 2 weeks but when she does go, her daughter pushes her around in a WC bc she gets dyspneic. She cannot remember the last time that she fell. This AM, she states that she ate a good breakfast but still feels fatigued.   On exam, her BP has improved with IVF and is 112/64.  She appears pale and more fatigued than normal. HRRR L exp wheezing ABD + BS all four that are normoactive. Soft, NT. Neuro : no focal  B12 331. Was 190 in Nov 2012. HgB 11.4. Cr 1.01. Gap 8. K 2.9. CXR Ok   I agree with Dr Van Clines formulated Assessment and Plan with the following changes:  1. Fatigue - likely multifactorial  UTI - Pt has completed ABX course and UA normal.  B12 Def - Will replete. Pt has h/o Celiac as cause of malabsorption but I could find no supporting evidence.   Acute diarrheal illness - resolved but would have contributed to her hypotension and hypokalemia  Adrenal insuff with acute illness - pt states was using stress dose steroid. Will cont here  Plan is IVF, stress dose steroids, encourage PO intake, PT/OT consult, B12 supplementation.   2. DOE and wheezing - pt did smoke in past. Will try nebs - might help to dry secretions / congestion.  3. MGUS - Creatinine, protein, calcium all normal. Can progress to MM and other malignancies that cause fatigue. Will get Heme/Onc records.

## 2011-08-14 ENCOUNTER — Inpatient Hospital Stay (HOSPITAL_COMMUNITY): Payer: Medicare Other

## 2011-08-14 MED ORDER — HYDROCORTISONE 5 MG PO TABS
5.0000 mg | ORAL_TABLET | Freq: Once | ORAL | Status: AC
  Administered 2011-08-14: 5 mg via ORAL
  Filled 2011-08-14: qty 1

## 2011-08-14 MED ORDER — HYDROCORTISONE 5 MG PO TABS
15.0000 mg | ORAL_TABLET | Freq: Every day | ORAL | Status: DC
  Administered 2011-08-15: 15 mg via ORAL
  Filled 2011-08-14 (×2): qty 1

## 2011-08-14 MED ORDER — DOXYCYCLINE HYCLATE 100 MG PO TABS
100.0000 mg | ORAL_TABLET | Freq: Two times a day (BID) | ORAL | Status: DC
  Administered 2011-08-14 – 2011-08-15 (×3): 100 mg via ORAL
  Filled 2011-08-14 (×4): qty 1

## 2011-08-14 NOTE — Evaluation (Signed)
Occupational Therapy Evaluation Patient Details Name: Rachel Bridges MRN: [id number] DOB: 01-31-1925 Today's Date: 08/14/2011  Problem List:  Patient Active Problem List  Diagnoses  . VITAMIN B12 DEFICIENCY  . HYPERLIPIDEMIA  . MONOCLONAL GAMMOPATHY  . ANXIETY  . DEPRESSION  . GERD  . CELIAC DISEASE  . DERMATITIS, SEBORRHEIC  . PSORIASIS  . GAIT IMBALANCE  . Abdominal pain  . Adult failure to thrive  . Normocytic anemia  . Acute on chronic renal failure  . Fatigue  . Diarrhea  . Adrenal insufficiency  . Peripheral edema  . Memory impairment    Past Medical History:  Past Medical History  Diagnosis Date  . MGUS (monoclonal gammopathy of unknown significance)      [facility]  . Anxiety   . History of colon cancer      History of bowel  obstruction. on treatment with Colace and MiraLax.  followed by Dr. Loreta Ave and Dr. Purnell Shoemaker  . Depression   . Diverticulosis of colon   . Osteopenia   . Lumbar spinal stenosis   . Atrophic vaginitis   . Renal cyst   . History of tobacco abuse   . Dermatitis   . Hip pain      History  . Actinic keratosis   . Dementia   . Adrenal suppression     central, on chronic steroids, followed by Dr. Sharl Ma of endocrinology   Past Surgical History:  Past Surgical History  Procedure Date  . Hemicolectomy      Right-sided  .  resection of ovarian cyst   . Cataract extraction      Left eye  . Knee arthroscopy      Left knee  . Appendectomy   . Carpal tunnel release   . Lumbar laminectomy   . Lumbar fusion   . Replacement total knee     left knee    OT Assessment/Plan/Recommendation OT Assessment OT Recommendation/Assessment: Patient will need skilled OT in the acute care venue OT Problem List: Decreased strength;Decreased activity tolerance;Impaired balance (sitting and/or standing);Decreased safety awareness OT Therapy Diagnosis : Generalized weakness OT Plan OT Frequency: Min 2X/week OT  Treatment/Interventions: Self-care/ADL training;Patient/family education;Balance training OT Recommendation Follow Up Recommendations: No OT follow up;Supervision/Assistance - 24 hour (likely no follow up OT; pt needs 24/7) Equipment Recommended: None recommended by PT;None recommended by OT Individuals Consulted Consulted and Agree with Results and Recommendations: Patient OT Goals Acute Rehab OT Goals OT Goal Formulation: With patient Time For Goal Achievement: 2 weeks ADL Goals Pt Will Perform Grooming: with supervision;Standing at sink Pt Will Transfer to Toilet: with min assist;Ambulation;3-in-1 (min guard ambulating to bathroom) Pt Will Perform Toileting - Hygiene: with supervision;Sit to stand from 3-in-1/toilet Pt Will Perform Tub/Shower Transfer: Shower transfer;with min assist;Ambulation;Shower seat with back  OT Evaluation Precautions/Restrictions  Precautions Precautions: Fall Restrictions Weight Bearing Restrictions: No Prior Functioning Home Living Lives With: Daughter;Other (Comment) (states husband passed away but family stays with her 24/7) Bathroom Shower/Tub: Walk-in Stage manager: Standard (with 3:1) Home Adaptive Equipment: Walker - rolling;Shower chair with back;Bedside commode/3-in-1 Prior Function Level of Independence: Needs assistance Needs Assistance:  (min A for adls)  ADL ADL Eating/Feeding: Simulated;Independent;Other (comment) (no teeth; needs soft food) Where Assessed - Eating/Feeding: Bed level Grooming: Simulated;Set up Where Assessed - Grooming: Sitting, bed;Unsupported Upper Body Bathing: Simulated;Set up Where Assessed - Upper Body Bathing: Sitting, bed;Unsupported Lower Body Bathing: Minimal assistance Where Assessed - Lower Body Bathing: Sit to stand  from bed Where Assessed - Upper Body Dressing: Sitting, bed;Unsupported Lower Body Dressing: Simulated;Moderate assistance;Other (comment) (has more swelling in legs now) Where  Assessed - Lower Body Dressing: Sit to stand from bed Toilet Transfer: Performed;Minimal assistance Toilet Transfer Method: Stand pivot Toilet Transfer Equipment: Bedside commode Toileting - Clothing Manipulation: Performed;Minimal assistance Where Assessed - Toileting Clothing Manipulation: Sit to stand from 3-in-1 or toilet Toileting - Hygiene: Performed;Minimal assistance Where Assessed - Toileting Hygiene: Sit to stand from 3-in-1 or toilet Tub/Shower Transfer: Not assessed Equipment Used: Standard walker Ambulation Related to ADLs: stand pivot transfer only ADL Comments: pt feels she is getting stronger, near baseline Vision/Perception  Vision - History Baseline Vision: Wears glasses only for reading Cognition Cognition Arousal/Alertness: Awake/alert Overall Cognitive Status: History of cognitive impairments History of Cognitive Impairment:  (baseline unknown) Orientation Level: Oriented X4 Sensation/Coordination   Extremity Assessment RUE Assessment RUE Assessment: Within Functional Limits LUE Assessment LUE Assessment: Within Functional Limits Mobility  Bed Mobility Supine to Sit: 4: Min assist;HOB elevated (Comment degrees) (20) Scooting to HOB: 4: Min assist Transfers Sit to Stand: 4: Min assist Exercises   End of Session OT - End of Session Activity Tolerance: Patient limited by fatigue Patient left: in bed;with call bell in reach;with bed alarm set General Behavior During Session: [facility] for tasks performed [facility] OTR/L [phone] 08/14/2011  Rachel Bridges 08/14/2011, 2:02 PM

## 2011-08-14 NOTE — Progress Notes (Signed)
Internal Medicine Teaching Service Attending Note Date: 08/14/2011  Patient name: Rachel Bridges  Medical record number: [id number]  Date of birth: May 21, 1924    This patient has been seen and discussed with the house staff. Please see their note for complete details. I concur with their findings with the following additions/corrections:  Ms Rachel Bridges looks great today. She is more alert, interactive, and perkier. Drs Tonny Branch and Yaakov Guthrie were able to discover that she was recently dx with Gluten Sensitivity by Dr Loreta Ave. She tries to avoid gluten at home. She occasionally gets bloating and watery diarrhea when she does consume gluten. We were unable to find a + TTG or small bowel biopsy. Will start a GF diet. Talk with pt's daughter (she gave permission) about SNF versus home with 24 hour assist.   Clotile [facility] 08/14/2011, 11:45 AM

## 2011-08-14 NOTE — Progress Notes (Addendum)
Subjective: Reports that she is doing well this morning.  I woke her up from sleep when I walked in.  She reports having some incontinence overnight, solid stools and urine, which she has on occasion at home as well.    Objective: Vital signs in last 24 hours: Filed Vitals:   08/13/11 1937 08/13/11 2100 08/14/11 0458 08/14/11 0800  BP:  110/58 146/76   Pulse:  74 61   Temp:  97.3 F (36.3 C) 97.7 F (36.5 C)   TempSrc:  Oral Axillary   Resp:  22 20   Weight:   134 lb 0.6 oz (60.8 kg)   SpO2: 96% 90% 94% 93%   Physical Exam:  GEN: No apparent distress, pale. Alert and oriented x 3. Pleasant, conversant, and cooperative to exam.   HEENT: head is autraumatic and normocephalic. EOMI.   PERRLA. Sclerae anicteric. Mucous membranes are moist. Oropharynx is without erythema, exudates, or other abnormal lesions.   RESP: Mild-moderate diffuse wheezing, mild rhonchi. No crackles.   CARDIOVASCULAR: regular rate, normal rhythm. Clear S1, S2, no murmurs, gallops, or rubs.  EXT: warm and dry. Peripheral pulses equal, intact, and +2 globally. No clubbing or cyanosis. No edema in b/l lower extremeties   Lab Results: Basic Metabolic Panel:  Lab 08/13/11 1610 08/12/11 1834  NA 144 145  K 4.1 2.9*  CL 112 109  CO2 23 28  GLUCOSE 118* 87  BUN 16 13  CREATININE 0.96 1.01  CALCIUM 8.0* 8.0*  MG -- 1.9  PHOS -- --   Liver Function Tests:  Lab 08/12/11 1834  AST 9  ALT 7  ALKPHOS 86  BILITOT 0.1*  PROT 5.6*  ALBUMIN 2.2*   CBC:  Lab 08/12/11 1834  WBC 4.9  NEUTROABS 2.9  HGB 11.4*  HCT 34.9*  MCV 88.4  PLT 176   Thyroid Function Tests:  Lab 08/12/11 1834  TSH 2.276  T4TOTAL --  FREET4 --  T3FREE --  THYROIDAB --   Anemia Panel:  Lab 08/12/11 1834  VITAMINB12 331  FOLATE --  FERRITIN --  TIBC --  IRON --  RETICCTPCT --   Urinalysis:  Lab 08/12/11 2345  COLORURINE YELLOW  LABSPEC 1.021  PHURINE 6.0  GLUCOSEU NEGATIVE  HGBUR NEGATIVE  BILIRUBINUR  NEGATIVE  KETONESUR NEGATIVE  PROTEINUR NEGATIVE  UROBILINOGEN 0.2  NITRITE NEGATIVE  LEUKOCYTESUR NEGATIVE    Micro Results: Recent Results (from the past 240 hour(s))  URINE CULTURE     Status: Normal   Collection Time   08/12/11 11:46 PM      Component Value Range Status Comment   Specimen Description URINE, RANDOM   Final    Special Requests PATIENT ON FOLLOWING [facility]   Final    Culture  Setup Time [id number]   Final    Colony Count NO GROWTH   Final    Culture NO GROWTH   Final    Report Status 08/13/2011 FINAL   Final    Studies/Results: X-ray Chest Pa And Lateral   08/12/2011  *RADIOLOGY REPORT*  Clinical Data: Failure to thrive.  Wheezing.  CHEST - 2 VIEW  Comparison: 05/06/2011  Findings: Heart is enlarged.  There is prominence of interstitial markings, stable.  No focal consolidations or definite pleural effusions identified.  Colonic distension again noted.  IMPRESSION: No interval change in the appearance of the chest.  Original Report Authenticated By: Patterson Hammersmith, M.D.   Medications: I have reviewed the patient's current medications. Scheduled Meds:   .  ipratropium  0.5 mg Nebulization BID   And  . albuterol  2.5 mg Nebulization BID  . aspirin EC  81 mg Oral Daily  . cyanocobalamin  1,000 mcg Intramuscular Daily  . enoxaparin  30 mg Subcutaneous Q24H  . famotidine  40 mg Oral Daily  . hydrocortisone  20 mg Oral BID  . mirtazapine  15 mg Oral QHS  . paricalcitol  1 mcg Oral Q M,W,F  . sertraline  25 mg Oral QHS  . DISCONTD: albuterol  2.5 mg Nebulization Q6H  . DISCONTD: albuterol  2.5 mg Nebulization BID  . DISCONTD: albuterol  2.5 mg Nebulization BID  . DISCONTD: ipratropium  0.5 mg Nebulization Q6H  . DISCONTD: ipratropium  0.5 mg Nebulization Q6H  . DISCONTD: ipratropium  0.5 mg Nebulization Q6H   Continuous Infusions:   . sodium chloride 125 mL/hr (08/14/11 0551)   PRN Meds:.acetaminophen, acetaminophen, albuterol, alum & mag  hydroxide-simeth, ipratropium, ondansetron (ZOFRAN) IV, ondansetron  Assessment/Plan:  # Failure to Thrive  Patient feeling poorly over the past few weeks, likely secondary to diarrheal illness, decreased oral intake, and chronic cough. Could also be related to urinary tract infection that she has ongoing treatment for. Could also be related to chronic anemia. Does have some wheezes on exam, though no rhonchi. UA negative here, so abx stopped pending urine culture result. Reports that she is eating well here.   -Nebulizers x 2 times yesterday.  Lungs sound more wheezy today, will cont scheduled nebulizers. -Will supplement her low normal B12: 1000 mcg IM Daily x 5 days, then weekly x 4 weeks, then monthly  -discontinue IVF today -Daughter clarifies history today that patient had a URI two weeks ago caught after a family get together where other people weer sick and others caught the URI as well.  The cough has persisted since other URI symptoms resolved.  Will repeat CXRay today given persistent wheezing.  Continue nebulizers today.  Will start doxycycline as well for possible bronchitis.    # Diarrhea  Reported diarrhea over the past week and half, but no watery bowel movements over the past 4 days. Differential for her diarrhea includes viral, bacterial, or antibiotic related, though it may be resolving. Did have CT scan of the abdomen/pelvis on 07/31/11 that was relatively stable when compared to prior. Seems to be resolved.  Per nursing stools have been loose but no watery diarrhea.  # UTI  Patient was seen in the ED on 07/31/11 and had a urine culture that showed greater than 100,000 colonies of group B strep. She had already been on antibiotics at that time for a UTI, but we are not sure where that was diagnosed. It is possible that that was through her nephrologist. Regardless, the patient has been taking Cipro and then Keflex, with a few more days left in the course.  - UA and urine culture  negative.  Abx stopped   # Mental Status / Memory Impairment: Patient's daughter complained of 2-4 weeks of memory loss, with patient asking repeat questions about timing of events during a day. Mini-cog exam on admission revealed 3 out of 3 word recall but some difficulty attending to initially remember the 3 words. Today patient denies any difference in her cognition recently from her baseline. Clock draw on admission was significant for putting digits on the right hand of the clock only. Today she is oriented to hospital and month but not year. She registers 3/3 and on delayed recalls got 2/3 items.  TSH normal, B12 low normal.  - Will repeat mini cog before discharge  # Hypotension: Resolved after IVF and stress steroids.  Will adjust steroids to 15mg  qam PO and 5mg  qpm PO.    #Gluten Intolerance: Spoke with assistant at Dr. Kenna Gilbert office.  August 2011 endoscopy with biopsy was negative for celiac disease.  Prior to this patient had elevated TTG IgG of 93 (normal <20).  Conclusion by Dr. Loreta Ave was that patient does not have Celiac disease but rather had a clinical diagnosis of gluten intolerance based on diarrhea/symptoms from eating gluten.  # Ppx  - lovenox    LOS: 2 days   Yaakov Guthrie, BRAD 08/14/2011, 8:07 AM

## 2011-08-15 ENCOUNTER — Other Ambulatory Visit (HOSPITAL_COMMUNITY): Payer: Self-pay | Admitting: Internal Medicine

## 2011-08-15 ENCOUNTER — Encounter (HOSPITAL_COMMUNITY): Payer: Self-pay | Admitting: *Deleted

## 2011-08-15 DIAGNOSIS — K9041 Non-celiac gluten sensitivity: Secondary | ICD-10-CM | POA: Diagnosis present

## 2011-08-15 DIAGNOSIS — K900 Celiac disease: Secondary | ICD-10-CM

## 2011-08-15 LAB — BASIC METABOLIC PANEL
Chloride: 115 mEq/L — ABNORMAL HIGH (ref 96–112)
GFR calc Af Amer: 57 mL/min — ABNORMAL LOW (ref >90)
GFR calc non Af Amer: 49 mL/min — ABNORMAL LOW (ref >90)
Glucose, Bld: 76 mg/dL (ref 70–99)
Potassium: 3.2 mEq/L — ABNORMAL LOW (ref 3.5–5.1)
Sodium: 147 mEq/L — ABNORMAL HIGH (ref 135–145)

## 2011-08-15 LAB — CBC
Hemoglobin: 10.1 g/dL — ABNORMAL LOW (ref 12.0–15.0)
Platelets: 149 10*3/uL — ABNORMAL LOW (ref 150–400)
RBC: 3.59 MIL/uL — ABNORMAL LOW (ref 3.87–5.11)
WBC: 4.9 10*3/uL (ref 4.0–10.5)

## 2011-08-15 MED ORDER — FUROSEMIDE 20 MG PO TABS: 20.0000 mg | ORAL_TABLET | Freq: Two times a day (BID) | ORAL | Status: DC

## 2011-08-15 MED ORDER — CYANOCOBALAMIN 1000 MCG/ML IJ SOLN: 1000.0000 ug | INTRAMUSCULAR | Status: DC

## 2011-08-15 MED ORDER — IPRATROPIUM-ALBUTEROL 18-103 MCG/ACT IN AERO: INHALATION_SPRAY | RESPIRATORY_TRACT | Status: DC

## 2011-08-15 MED ORDER — POTASSIUM CHLORIDE ER 10 MEQ PO TBCR: 20.0000 meq | EXTENDED_RELEASE_TABLET | Freq: Two times a day (BID) | ORAL | Status: DC

## 2011-08-15 MED ORDER — MIRTAZAPINE 15 MG PO TABS: 15.0000 mg | ORAL_TABLET | Freq: Every day | ORAL | Status: DC

## 2011-08-15 MED ORDER — HYDROCORTISONE 5 MG PO TABS: ORAL_TABLET | ORAL | Status: DC

## 2011-08-15 MED ORDER — DOXYCYCLINE HYCLATE 100 MG PO TABS: 100.0000 mg | ORAL_TABLET | Freq: Two times a day (BID) | ORAL | Status: DC

## 2011-08-15 MED ORDER — DOXYCYCLINE HYCLATE 100 MG PO TABS: 100.0000 mg | ORAL_TABLET | Freq: Two times a day (BID) | ORAL | Status: AC

## 2011-08-15 MED ORDER — POTASSIUM CHLORIDE CRYS ER 20 MEQ PO TBCR
40.0000 meq | EXTENDED_RELEASE_TABLET | Freq: Once | ORAL | Status: AC
  Administered 2011-08-15: 40 meq via ORAL

## 2011-08-15 NOTE — Progress Notes (Signed)
   CARE MANAGEMENT NOTE 08/15/2011  Patient:  MARRIE, CHANDRA   Account Number:  [id number]  Date Initiated:  08/15/2011  Documentation initiated by:  [name]  Subjective/Objective Assessment:   Order for HHPT     Action/Plan:   Discussed with pt who is actually active with [name], will resume HHPT services.   Anticipated DC Date:  08/15/2011   Anticipated DC Plan:  HOME W HOME HEALTH SERVICES         [facility] Choice  HOME HEALTH   Choice offered to / List presented to:  C-1 Patient        HH arranged  HH-2 PT      [facility] agency  [facility]   Status of service:  Completed, signed off Medicare Important Message given?   (If response is "NO", the following Medicare IM given date fields will be blank) Date Medicare IM given:   Date Additional Medicare IM given:    Discharge Disposition:  HOME W HOME HEALTH SERVICES  Per UR Regulation:    If discussed at Long Length of Stay Meetings, dates discussed:    Comments:  08/15/2011 After much discussion discovered that pt is active with [name], will resume services. [name] RN MPH

## 2011-08-15 NOTE — Progress Notes (Signed)
Subjective: Feels well this morning.  Did not sleep very well.  Ready to go home today.     Objective: Vital signs in last 24 hours: Filed Vitals:   08/14/11 1415 08/14/11 2019 08/14/11 2100 08/15/11 0500  BP: 127/76  134/69 146/87  Pulse: 76  67 68  Temp: 97.6 F (36.4 C)  97.3 F (36.3 C) 97 F (36.1 C)  TempSrc: Oral  Oral Oral  Resp: 20  18 18   Weight:    136 lb 3.9 oz (61.8 kg)  SpO2: 94% 96% 96% 96%   Physical Exam:  GEN: No apparent distress, pale. Alert and oriented x 3. Pleasant, conversant, and cooperative to exam.  HEENT: head is autraumatic and normocephalic. EOMI.  PERRLA. Sclerae anicteric. Mucous membranes are moist. Oropharynx is without erythema, exudates, or other abnormal lesions.  RESP: Almost no wheezing, no rhonchi, good air movement.  No crackles.   CARDIOVASCULAR: regular rate, normal rhythm. Clear S1, S2, no murmurs, gallops, or rubs.  EXT: warm and dry. Peripheral pulses equal, intact, and +2 globally. No clubbing or cyanosis. No edema in b/l lower extremeties   Lab Results: Basic Metabolic Panel:  Lab 08/13/11 9604 08/12/11 1834  NA 144 145  K 4.1 2.9*  CL 112 109  CO2 23 28  GLUCOSE 118* 87  BUN 16 13  CREATININE 0.96 1.01  CALCIUM 8.0* 8.0*  MG -- 1.9  PHOS -- --   Liver Function Tests:  Lab 08/12/11 1834  AST 9  ALT 7  ALKPHOS 86  BILITOT 0.1*  PROT 5.6*  ALBUMIN 2.2*   CBC:  Lab 08/15/11 0710 08/12/11 1834  WBC 4.9 4.9  NEUTROABS -- 2.9  HGB 10.1* 11.4*  HCT 32.0* 34.9*  MCV 89.1 88.4  PLT 149* 176   Thyroid Function Tests:  Lab 08/12/11 1834  TSH 2.276  T4TOTAL --  FREET4 --  T3FREE --  THYROIDAB --   Anemia Panel:  Lab 08/12/11 1834  VITAMINB12 331  FOLATE --  FERRITIN --  TIBC --  IRON --  RETICCTPCT --   Urinalysis:  Lab 08/12/11 2345  COLORURINE YELLOW  LABSPEC 1.021  PHURINE 6.0  GLUCOSEU NEGATIVE  HGBUR NEGATIVE  BILIRUBINUR NEGATIVE  KETONESUR NEGATIVE  PROTEINUR NEGATIVE  UROBILINOGEN  0.2  NITRITE NEGATIVE  LEUKOCYTESUR NEGATIVE    Micro Results: Recent Results (from the past 240 hour(s))  URINE CULTURE     Status: Normal   Collection Time   08/12/11 11:46 PM      Component Value Range Status Comment   Specimen Description URINE, RANDOM   Final    Special Requests PATIENT ON FOLLOWING [facility]   Final    Culture  Setup Time [id number]   Final    Colony Count NO GROWTH   Final    Culture NO GROWTH   Final    Report Status 08/13/2011 FINAL   Final    Studies/Results: Dg Chest 2 View  08/14/2011  *RADIOLOGY REPORT*  Clinical Data: Cough.  CHEST - 2 VIEW  Comparison: Radiographs 08/12/2011 and 04/10/2011.  CT 03/05/2011.  Findings: The heart size and mediastinal contours are stable. There is chronic elevation of the right hemidiaphragm and colonic interposition.  There are increased markings of both lung bases which have progressed compared with the prior examination.  No consolidation or significant pleural effusion is seen.  The osseous structures appear unchanged.  IMPRESSION: Mildly progressive interstitial prominence at both lung bases suggesting edema or possible bronchiolitis.  No focal airspace  disease.  Original Report Authenticated By: Gerrianne Scale, M.D.   Medications: I have reviewed the patient's current medications. Scheduled Meds:   . ipratropium  0.5 mg Nebulization BID   And  . albuterol  2.5 mg Nebulization BID  . aspirin EC  81 mg Oral Daily  . cyanocobalamin  1,000 mcg Intramuscular Daily  . doxycycline  100 mg Oral Q12H  . enoxaparin  30 mg Subcutaneous Q24H  . famotidine  40 mg Oral Daily  . hydrocortisone  15 mg Oral Q breakfast  . hydrocortisone  5 mg Oral Once  . mirtazapine  15 mg Oral QHS  . paricalcitol  1 mcg Oral Q M,W,F  . sertraline  25 mg Oral QHS  . DISCONTD: hydrocortisone  20 mg Oral BID   Continuous Infusions:   . DISCONTD: sodium chloride 125 mL/hr (08/14/11 0551)   PRN Meds:.acetaminophen, acetaminophen, albuterol,  alum & mag hydroxide-simeth, ipratropium, ondansetron (ZOFRAN) IV, ondansetron   Assessment/Plan:  # Failure to Thrive  Patient felt poorly over the past few weeks, likely secondary to diarrheal illness, decreased oral intake, and chronic cough. Could also have been related to urinary tract infection that she has ongoing treatment for. Could also be related to chronic anemia. Does have some wheezes on exam, though no rhonchi. UA negative here, so abx stopped pending urine culture result. Reports that she is eating well here.  -Nebulizers scheduled here.  Lungs sound better today. -Will supplement her low normal B12: 1000 mcg IM Daily x 5 days, then weekly x 4 weeks, then monthly  -discontinue IVF  -Daughter clarifies history today that patient had a URI two weeks ago caught after a family get together where other people were sick and others caught the URI as well. The cough has persisted since other URI symptoms resolved.  Cont nebs and doxy here.  Cont steroids as per above.  Will give combivent on discharge to use scheduled x 3 days then PRN.  PFTs as outpatient would be helpful to clarify possibility of COPD is this past smoker.    # Diarrhea  Reported diarrhea over the past week and half, but no watery bowel movements over the past 4 days. Differential for her diarrhea includes viral, bacterial, or antibiotic related, though it may be resolving. Did have CT scan of the abdomen/pelvis on 07/31/11 that was relatively stable when compared to prior. Seems to be resolved. Per nursing stools were loose yesterday but no watery diarrhea.   # UTI  Patient was seen in the ED on 07/31/11 and had a urine culture that showed greater than 100,000 colonies of group B strep. She had already been on antibiotics at that time for a UTI, but we are not sure where that was diagnosed. It is possible that that was through her nephrologist. Regardless, the patient has been taking Cipro and then Keflex, with a few more days  left in the course.  - UA and urine culture negative. Abx stopped   # Mental Status / Memory Impairment:  Patient's daughter complained of 2-4 weeks of memory loss, with patient asking repeat questions about timing of events during a day. Mini-cog exam on admission revealed 3 out of 3 word recall but some difficulty attending to initially remember the 3 words. Per family patient was back to her baseline yesterday.    # Hypotension: Resolved after IVF and stress steroids. Will adjust steroids to 15mg  qam PO and 5mg  qpm PO.   Will discharge on this regimen.    #  Gluten Intolerance: Spoke with assistant at Dr. Kenna Gilbert office. August 2011 endoscopy with biopsy was negative for celiac disease. Prior to this patient had elevated TTG IgG of 93 (normal <20). Conclusion by Dr. Loreta Ave was that patient does not have Celiac disease but rather had a clinical diagnosis of gluten intolerance based on diarrhea/symptoms from eating gluten.  Continue home diet on discharge.    HypoKalemia: K 3.2 this morning.  On potassium supplement at home and Lasix but neither here.  Will given Kdur x 1 today.    # Ppx  - lovenox     LOS: 3 days   Yaakov Guthrie, BRAD 08/15/2011, 8:33 AM

## 2011-08-15 NOTE — Progress Notes (Signed)
Internal Medicine Teaching Service Attending Note Date: 08/15/2011  Patient name: Rachel Bridges  Medical record number: [id number]  Date of birth: 1924/06/16    This patient has been seen and discussed with the house staff. Please see their note for complete details. I concur with their findings with the following additions/corrections:  Ms Scheurich is stable for D/C today. I spoke with daughter who states that we always set her up with PT/OT at D/C and they do not feel that it helps. Daughter give B12 injection on the 15th of every month and know how to inject it. Pt requests something for sleep. She is on 15 mg Remeron and that can be increased to see if helps. Pt denies sleeping during the day.   She has presumptive COPD. She has never used MDI's nor had PFT's. We sill send home on combivent and arrange for PFT's to confirm the dx and help guide tx.   Jaikob Borgwardt 08/15/2011, 9:11 AM

## 2011-08-15 NOTE — Progress Notes (Signed)
Received recommendation for outpatient PT from therapist on pt who was in the process of d/c , referral made after d/c. Spoke with pt MD who will provide prescription for outpatient PT tomorrow am. This CM will contact family re referred outpatient center and attempt to set this up tomorrow.  [name] RN MPH Case Manager [phone]

## 2011-08-15 NOTE — Progress Notes (Signed)
PT Cancellation Note  Treatment cancelled today due to pt in the process of d/c'ing home. Spoke with family who reports pt refuses to participate in HHPT everytime they come out to the house. I suggested that maybe outpatient physical therapy might work better and family appeared agreeable to it. Spoke with Dr. Yaakov Guthrie who OK'd this as long as she will be getting either HHPT or OPPT. Will contact CM to see if we can set this up. Humberto Seals HELEN 08/15/2011, 3:07 PM

## 2011-08-16 ENCOUNTER — Other Ambulatory Visit: Payer: Self-pay | Admitting: Internal Medicine

## 2011-08-16 DIAGNOSIS — R5383 Other fatigue: Secondary | ICD-10-CM

## 2011-08-16 NOTE — Progress Notes (Signed)
Received request for outpatient rehab for this pt who already d/c to home prior to recommendation by PT. Spoke with Dr Leonard Schwartz  Rachel Bridges who was out of the building but was able to provide a prescription today 08/16/2011. This CM spoke with pt daughter who wishes for the to go to outpatient PT and will provide transportation. Call place to [facility] and all info faxed to that facility. They will contact daughter with appointment time. This CM then call this pt daughter Rachel Bridges @ [phone] and informed her info being faxed and that the rehab center will call her to arrange an appointment time.  [name] RN MPH Case Manager [phone].

## 2011-08-20 NOTE — Discharge Summary (Signed)
Internal Medicine Teaching [facility] Discharge Note  Name: Rachel Bridges MRN: [id number] DOB: November 22, 1924 78 y.o.  Date of Admission: 08/12/2011  5:24 PM Date of Discharge: 08/15/2011 Attending Physician: Dr. Blanch Media  Discharge Diagnosis:  Adult failure to thrive  Hypotension  Normocytic anemia   Fatigue   Diarrhea   Adrenal insufficiency   Memory impairment   Gluten intolerance  Discharge Medications: Medication List  As of 08/20/2011  1:21 PM   STOP taking these medications         cephALEXin 500 MG capsule      ciprofloxacin 500 MG tablet         TAKE these medications         albuterol-ipratropium 18-103 MCG/ACT inhaler   Commonly known as: COMBIVENT   Inhale two puffs three times per day for three days.  After that, inhale two puffs every six hours as needed for shortness of breath      aspirin 81 MG tablet   Take 81 mg by mouth daily.      calcium citrate-vitamin D 315-200 MG-UNIT per tablet   Commonly known as: CITRACAL+D   Take 1 tablet by mouth 2 (two) times daily.      cyanocobalamin 1000 MCG/ML injection   Commonly known as: (VITAMIN B-12)   Inject 1 mL (1,000 mcg total) into the muscle every 30 (thirty) days.      doxycycline 100 MG tablet   Commonly known as: VIBRA-TABS   Take 1 tablet (100 mg total) by mouth every 12 (twelve) hours.      famotidine 40 MG tablet   Commonly known as: PEPCID   Take 40 mg by mouth daily.      ferrous fumarate 325 (106 FE) MG Tabs   Commonly known as: HEMOCYTE - 106 mg FE   Take 1 tablet by mouth 3 (three) times daily.      hydrocortisone 5 MG tablet   Commonly known as: CORTEF   Take three tablets by mouth in the morning (15mg ) and one tablet (5mg ) in the afternoon each day.      ondansetron 4 MG tablet   Commonly known as: ZOFRAN   Take 4 mg by mouth daily as needed. For nausea      potassium chloride 10 MEQ tablet   Commonly known as: K-DUR   Take 2 tablets (20 mEq total) by mouth  2 (two) times daily.      sertraline 25 MG tablet   Commonly known as: ZOLOFT   Take 25 mg by mouth at bedtime.      ZEMPLAR 1 MCG capsule   Generic drug: paricalcitol   Take 1 mcg by mouth every Monday, Wednesday, and Friday.         ASK your doctor about these medications         furosemide 20 MG tablet   Commonly known as: LASIX   TAKE 1 TABLET BY MOUTH TWICE DAILY            Disposition and follow-up:   Rachel Bridges was discharged from [facility] in Stable condition.  She wished to return home to live with her family and receive PT as an outpatient (outpatient PT was setup rather than home PT at patient request).  At [facility], assess for resolution of her cough and make sure she is going to PT.  She likely needs referral for outpatient PFTs given possibility of COPD in this past smoker and likely  bronchitis this admission.  Follow-up Appointments: Follow-up Information    Follow up with Deatra Robinson, MD on 08/22/2011. (2:45pm)    Contact information:   [address] [place] [address] [phone]         Discharge Orders    Future Appointments: Provider: Department: Dept Phone: Center:   08/22/2011 2:45 PM Denna Haggard, MD [facility] [phone] [facility]   02/07/2012 12:00 PM Mauri Brooklyn [facility] [phone] None   02/14/2012 9:00 AM Laurice Record, MD [facility] [phone] None     Future Orders Please Complete By Expires   Diet gluten free      Activity as tolerated - No restrictions      Call MD for:  difficulty breathing, headache or visual disturbances      Call MD for:  persistant dizziness or light-headedness         Consultations:  none  Procedures Performed:  Dg Chest 2 View  08/14/2011  *RADIOLOGY REPORT*  Clinical Data: Cough.  CHEST - 2 VIEW  Comparison: Radiographs 08/12/2011 and 04/10/2011.  CT 03/05/2011.  Findings: The heart size and mediastinal contours are stable.  There is chronic elevation of the right hemidiaphragm and colonic interposition.  There are increased markings of both lung bases which have progressed compared with the prior examination.  No consolidation or significant pleural effusion is seen.  The osseous structures appear unchanged.  IMPRESSION: Mildly progressive interstitial prominence at both lung bases suggesting edema or possible bronchiolitis.  No focal airspace disease.  Original Report Authenticated By: Gerrianne Scale, M.D.   X-ray Chest Pa And Lateral   08/12/2011  *RADIOLOGY REPORT*  Clinical Data: Failure to thrive.  Wheezing.  CHEST - 2 VIEW  Comparison: 05/06/2011  Findings: Heart is enlarged.  There is prominence of interstitial markings, stable.  No focal consolidations or definite pleural effusions identified.  Colonic distension again noted.  IMPRESSION: No interval change in the appearance of the chest.  Original Report Authenticated By: Patterson Hammersmith, M.D.   Ct Abdomen Pelvis W Contrast  08/01/2011  *RADIOLOGY REPORT*  Clinical Data: Nausea with chills.  Mild abdominal cramping. Occasional diarrhea.  CT ABDOMEN AND PELVIS WITH CONTRAST  Technique:  Multidetector CT imaging of the abdomen and pelvis was performed following the standard protocol during bolus administration of intravenous contrast.  Contrast:  100 ml Omnipaque 300  Comparison: CT chest and abdomen and pelvis 03/05/2011  Findings: Slight fibrosis or atelectasis in the lung bases. Partial visualization of indeterminate nodule in the right mid lung measuring 6 mm diameter.  This was present without interval change on prior chest CT from 03/05/2011. Sub centimeter circumscribed low attenuation lesions in the liver, likely representing small cysts although too small to characterize.  Spleen size is normal.  The gallbladder is not visualized and may be atrophic or surgically absent.  Pancreas is unremarkable.  Fullness of the left adrenal gland suggesting hyperplasia,  similar to previous study.  Multiple cysts in both kidneys.  Very large cysts in the left kidney, including the largest measuring 5.5 x 12.8 cm.  These are stable since the previous study.  No solid mass or hydronephrosis in the kidneys.  Calcification of the abdominal aorta without aneurysm. Portal and mesenteric vessels are patent.  Mesenteric lymphadenopathy with mild enlarged lymph nodes up to about 10 mm diameter.  This is stable since the prior study.  The transverse and ascending colon are diffusely distended with air fluid levels. The  descending and sigmoid colon are decompressed.  No definite evidence of a mass or stricture at the transition zone.  The appearance is unchanged since the previous study.  There is dilatation and air-fluid levels in the terminal ileum, likely related to the colonic process.  The proximal small bowel are decompressed.  Pelvis:  The uterus and adnexal structures are not enlarged.  The bladder wall is not thickened.  Multiple calcifications consistent with phleboliths.  No free or loculated pelvic fluid collections. The bladder wall is not thickened.  The appendix is not localized. Tarlov cysts in the sacrum. Postoperative changes with posterior plate screw fixation of L3-L5.  Residual anterior subluxation of L4 on L5.  This is stable since the previous study.  IMPRESSION: Generally stable appearance since the previous study.  Again demonstrated is marked dilatation of the right and transverse colon without focal mass demonstrated at the transition zone.  Distal small bowel dilatation which may be related to the colonic process. These changes may be related to chronic dysmotility, ileus, or stricture.  Large renal cysts.  Stable prominence of the left adrenal gland and mesenteric lymph nodes.  Original Report Authenticated By: Marlon Pel, M.D.   US Renal  07/25/2011  *RADIOLOGY REPORT*  Clinical Data: 78 year old with hypertension.  RENAL/URINARY TRACT ULTRASOUND  COMPLETE  Comparison:  Abdominal CT 03/05/2011 and renal ultrasound 03/04/2011  Findings:  Right Kidney:  The right kidney measures 10.3 cm in length.  Again noted is a hypoechoic cyst with acoustic enhancement in the upper pole that measures 4.7 x 4.2 x 4.4 cm.  This cyst previously measured 3.6 x 3.5 x 4.3 cm.  There are at least two additional cysts within the right kidney.  There is mild cortical thinning which is unchanged.  Left Kidney:  Left kidney is echogenic and measures 11.5 cm in length. Again noted are large hypoechoic structures around the left kidney that are suggestive for cysts.  Largest cyst roughly measures 11.2 x 7.0 x 10.9 cm.  Some of the small cortical cysts have septations.  No evidence for hydronephrosis.  Bladder:  Fluid within the urinary bladder without gross abnormality.  IMPRESSION: Bilateral renal cysts.   Again noted are large cystic structures around the left kidney. There may be cortical thinning involving the kidneys.  The left kidney appears echogenic but this could also be related to the acoustic enhancement from the adjacent cysts.  The largest right kidney cyst appears to have enlarged since 03/04/2011.  Some of the left renal cysts have septations.  No evidence for hydronephrosis.  Original Report Authenticated By: Richarda Overlie, M.D.    Admission HPI:  Patient is a very pleasant 78 year old woman with history of MGUS, celiac disease, anxiety, CKD, and adrenal insufficiency who presents with generalized weakness. Patient describes feeling very poorly for the past 2 weeks including diarrhea, decreased by mouth intake, cough, and a UTI that was diagnosed in the ED on 4/3. She was started on Bactrim and then switched to Keflex. She describes dizziness when sitting up or standing. She reports less urination than usual over the same duration. Denies any chest pain, but does endorse some diffuse abdominal pain and a seatbelt distribution.  Patient's daughter also reports a decrease  in memory over the past 2-4 weeks, with the patient frequently repeating questions about timing of the days events or about things that have happened recently.   Hospital Course by problem list:  Adult failure to thrive: Patient felt poorly over the past few  weeks, likely secondary to diarrheal illness, decreased oral intake, and chronic cough. Could also have been related to urinary tract infection that she was undergoing treatment for.  UA on admission showed UTI resolved, so abx stopped. Could also be related to chronic anemia. Patient felt better as hospitalization progressed with increased steroids and IVF, and was back to her baseline day before discharge.  Did have some wheezes on exam, though no rhonchi, and her cough improved some with nebulizer treatments, typical of bronchitis. Ate well in hospital.  Discharged on combivent TID for three days to help decrease her cough. Is to continue outpatient B12 shots monthly.  B12 level low normal here 331 and improved from previous.  She likely could use outpatient PFTs to clarify possibility of COPD in this past smoker.   Diarrhea: Reported diarrhea over the past week and half before admission, but no watery bowel movements in the hospital.  Likely contributed to her presentation. CT scan of the abdomen/pelvis on 07/31/11 that was relatively stable when compared to prior.   Adrenal insufficiency: Hypotensive on presentation 76/56, likely due to poor PO intake, diarrhea, and her adrenal insufficiency.  Daughter had doubled her daily steroid dose to 20mg  PO BID as instructed previously by Dr. Sharl Ma when her mother does not feel well.  Continued on 20mg  PO BID on admission and pressures normalized quickly with IV fluids.  She was tapered back to 15mg  PO qam and 5mg  PO qpm on discharge as instructed by her outpatient endocrinologist Dr. Sharl Ma over the phone.     Gluten intolerance: Spoke with assistant at Dr. Kenna Gilbert office during this hospitalization. August  2011 endoscopy with biopsy was negative for celiac disease. Prior to this patient had elevated TTG IgG of 93 (normal <20). Conclusion by Dr. Loreta Ave was that patient does not have Celiac disease but rather had a clinical diagnosis of gluten intolerance based on diarrhea/symptoms from eating gluten. Continue home diet on discharge.    Discharge Vitals:  BP 144/79  Pulse 56  Temp(Src) 98.1 F (36.7 C) (Oral)  Resp 18  Wt 136 lb 3.9 oz (61.8 kg)  SpO2 98%  Discharge Labs:  Admission on 08/12/2011, Discharged on 08/15/2011  Component Date Value Range Status  . Sodium (mEq/L) 08/12/2011 145  135-145 Final  . Potassium (mEq/L) 08/12/2011 2.9* 3.5-5.1 Final  . Chloride (mEq/L) 08/12/2011 109  96-112 Final  . CO2 (mEq/L) 08/12/2011 28  19-32 Final  . Glucose, Bld (mg/dL) 16/01/9603 87  54-09 Final  . BUN (mg/dL) 81/19/1478 13  2-95 Final  . Creatinine, Ser (mg/dL) 62/13/0865 7.84  6.96-2.95 Final  . Calcium (mg/dL) 28/41/3244 8.0* 0.1-02.7 Final  . Total Protein (g/dL) 25/36/6440 5.6* 3.4-7.4 Final  . Albumin (g/dL) 25/95/6387 2.2* 5.6-4.3 Final  . AST (U/L) 08/12/2011 9  0-37 Final  . ALT (U/L) 08/12/2011 7  0-35 Final  . Alkaline Phosphatase (U/L) 08/12/2011 86  39-117 Final  . Total Bilirubin (mg/dL) 32/95/1884 0.1* 1.6-6.0 Final  . GFR calc non Af Amer (mL/min) 08/12/2011 50* >90 Final  . GFR calc Af Amer (mL/min) 08/12/2011 58* >90 Final   Comment:                                 The eGFR has been calculated                          using the CKD EPI  equation.                          This calculation has not been                          validated in all clinical                          situations.                          eGFR's persistently                          <90 mL/min signify                          possible Chronic Kidney Disease.  . Magnesium (mg/dL) 98/02/9146 1.9  8.2-9.5 Final  . WBC (K/uL) 08/12/2011 4.9  4.0-10.5 Final  . RBC (MIL/uL) 08/12/2011 3.95  3.87-5.11  Final  . Hemoglobin (g/dL) 62/13/0865 78.4* 69.6-29.5 Final  . HCT (%) 08/12/2011 34.9* 36.0-46.0 Final  . MCV (fL) 08/12/2011 88.4  78.0-100.0 Final  . MCH (pg) 08/12/2011 28.9  26.0-34.0 Final  . MCHC (g/dL) 28/41/3244 01.0  27.2-53.6 Final  . RDW (%) 08/12/2011 13.9  11.5-15.5 Final  . Platelets (K/uL) 08/12/2011 176  150-400 Final  . Neutrophils Relative (%) 08/12/2011 59  43-77 Final  . Neutro Abs (K/uL) 08/12/2011 2.9  1.7-7.7 Final  . Lymphocytes Relative (%) 08/12/2011 34  12-46 Final  . Lymphs Abs (K/uL) 08/12/2011 1.7  0.7-4.0 Final  . Monocytes Relative (%) 08/12/2011 5  3-12 Final  . Monocytes Absolute (K/uL) 08/12/2011 0.3  0.1-1.0 Final  . Eosinophils Relative (%) 08/12/2011 1  0-5 Final  . Eosinophils Absolute (K/uL) 08/12/2011 0.1  0.0-0.7 Final  . Basophils Relative (%) 08/12/2011 0  0-1 Final  . Basophils Absolute (K/uL) 08/12/2011 0.0  0.0-0.1 Final  . TSH (uIU/mL) 08/12/2011 2.276  0.350-4.500 Final  . Vitamin B-12 (pg/mL) 08/12/2011 331  211-911 Final  . Color, Urine  08/12/2011 YELLOW  YELLOW Final  . APPearance  08/12/2011 CLEAR  CLEAR Final  . Specific Gravity, Urine  08/12/2011 1.021  1.005-1.030 Final  . pH  08/12/2011 6.0  5.0-8.0 Final  . Glucose, UA (mg/dL) 64/40/3474 NEGATIVE  NEGATIVE Final  . Hgb urine dipstick  08/12/2011 NEGATIVE  NEGATIVE Final  . Bilirubin Urine  08/12/2011 NEGATIVE  NEGATIVE Final  . Ketones, ur (mg/dL) 25/95/6387 NEGATIVE  NEGATIVE Final  . Protein, ur (mg/dL) 56/43/3295 NEGATIVE  NEGATIVE Final  . Urobilinogen, UA (mg/dL) 18/84/1660 0.2  6.3-0.1 Final  . Nitrite  08/12/2011 NEGATIVE  NEGATIVE Final  . Leukocytes, UA  08/12/2011 NEGATIVE  NEGATIVE Final   MICROSCOPIC NOT DONE ON URINES WITH NEGATIVE PROTEIN, BLOOD, LEUKOCYTES, NITRITE, OR GLUCOSE <1000 mg/dL.  Marland Kitchen Specimen Description  08/12/2011 URINE, RANDOM   Final  . Special Requests  08/12/2011 PATIENT ON FOLLOWING KEFLEX   Final  . Culture  Setup Time  08/12/2011  [id number]   Final  . Colony Count  08/12/2011 NO GROWTH   Final  . Culture  08/12/2011 NO GROWTH   Final  . Report Status  08/12/2011 08/13/2011 FINAL   Final  . Sodium (mEq/L) 08/13/2011 144  135-145 Final  . Potassium (mEq/L) 08/13/2011 4.1  3.5-5.1  Final  . Chloride (mEq/L) 08/13/2011 112  96-112 Final  . CO2 (mEq/L) 08/13/2011 23  19-32 Final  . Glucose, Bld (mg/dL) 16/01/9603 540* 98-11 Final  . BUN (mg/dL) 91/47/8295 16  6-21 Final  . Creatinine, Ser (mg/dL) 30/86/5784 6.96  2.95-2.84 Final  . Calcium (mg/dL) 13/24/4010 8.0* 2.7-25.3 Final  . GFR calc non Af Amer (mL/min) 08/13/2011 53* >90 Final  . GFR calc Af Amer (mL/min) 08/13/2011 61* >90 Final   Comment:                                 The eGFR has been calculated                          using the CKD EPI equation.                          This calculation has not been                          validated in all clinical                          situations.                          eGFR's persistently                          <90 mL/min signify                          possible Chronic Kidney Disease.  . Sodium (mEq/L) 08/15/2011 147* 135-145 Final  . Potassium (mEq/L) 08/15/2011 3.2* 3.5-5.1 Final  . Chloride (mEq/L) 08/15/2011 115* 96-112 Final  . CO2 (mEq/L) 08/15/2011 24  19-32 Final  . Glucose, Bld (mg/dL) 66/44/0347 76  42-59 Final  . BUN (mg/dL) 56/38/7564 20  3-32 Final  . Creatinine, Ser (mg/dL) 95/18/8416 6.06  3.01-6.01 Final  . Calcium (mg/dL) 09/32/3557 7.9* 3.2-20.2 Final  . GFR calc non Af Amer (mL/min) 08/15/2011 49* >90 Final  . GFR calc Af Amer (mL/min) 08/15/2011 57* >90 Final   Comment:                                 The eGFR has been calculated                          using the CKD EPI equation.                          This calculation has not been                          validated in all clinical                          situations.                          eGFR's persistently                           <  90 mL/min signify                          possible Chronic Kidney Disease.  . WBC (K/uL) 08/15/2011 4.9  4.0-10.5 Final  . RBC (MIL/uL) 08/15/2011 3.59* 3.87-5.11 Final  . Hemoglobin (g/dL) 16/01/9603 54.0* 98.1-19.1 Final  . HCT (%) 08/15/2011 32.0* 36.0-46.0 Final  . MCV (fL) 08/15/2011 89.1  78.0-100.0 Final  . MCH (pg) 08/15/2011 28.1  26.0-34.0 Final  . MCHC (g/dL) 47/82/9562 13.0  86.5-78.4 Final  . RDW (%) 08/15/2011 14.3  11.5-15.5 Final  . Platelets (K/uL) 08/15/2011 149* 150-400 Final  ] SignedYaakov Guthrie, BRAD 08/20/2011, 1:21 PM   Time Spent on Discharge: 45 minutes

## 2011-08-22 ENCOUNTER — Encounter: Payer: Self-pay | Admitting: Internal Medicine

## 2011-08-22 ENCOUNTER — Ambulatory Visit (INDEPENDENT_AMBULATORY_CARE_PROVIDER_SITE_OTHER): Payer: Medicare Other | Admitting: Internal Medicine

## 2011-08-22 VITALS — BP 119/76 | HR 74 | Temp 97.1°F | Wt 125.0 lb

## 2011-08-22 DIAGNOSIS — H6120 Impacted cerumen, unspecified ear: Secondary | ICD-10-CM

## 2011-08-22 DIAGNOSIS — N390 Urinary tract infection, site not specified: Secondary | ICD-10-CM

## 2011-08-22 DIAGNOSIS — H918X9 Other specified hearing loss, unspecified ear: Secondary | ICD-10-CM

## 2011-08-22 NOTE — Patient Instructions (Signed)
Please, return on Monday. Thank you

## 2011-08-22 NOTE — Progress Notes (Signed)
Patient ID: Rachel Bridges, female   DOB: [birth year], 78 y.o.   MRN: [id number] HPI:    1. UTI. Patient is sp hospitalization. Reports feeling "much better." denies any fever, chills, dysuria, weakness, N/V. Patient is to complete her last dose of Abx this pm. 2. Left ear hearing deficit. Denies any tinnitus, fever, chills, runny nose, congestions or trauma. Review of Systems: Negative except per history of present illness  Physical Exam:  Nursing notes and vitals reviewed General:  alert, well-developed, and cooperative to examination.   Lungs:  normal respiratory effort, no accessory muscle use, normal breath sounds, no crackles, and no wheezes. Heart:  normal rate, regular rhythm, no murmurs, no gallop, and no rub.   Abdomen:  soft, non-tender, normal bowel sounds, no distention, no guarding, no rebound tenderness, no hepatomegaly, and no splenomegaly.   Extremities:  No cyanosis, clubbing, edema Neurologic:  alert & oriented X3, nonfocal exam  Meds:  (Not in a hospital admission)  Allergies: Morphine and related; Oxycodone hcl; and Pantoprazole Past Medical History  Diagnosis Date  . MGUS (monoclonal gammopathy of unknown significance)      [facility]  . Anxiety   . History of colon cancer      History of bowel  obstruction. on treatment with Colace and MiraLax.  followed by Dr. Loreta Ave and Dr. Purnell Shoemaker  . Depression   . Diverticulosis of colon   . Osteopenia   . Lumbar spinal stenosis   . Atrophic vaginitis   . Renal cyst   . History of tobacco abuse     at least 64 pack year. Started about age 73 and continued until about age 54.  Marland Kitchen Dermatitis   . Hip pain      History  . Actinic keratosis   . Dementia   . Adrenal suppression     central, on chronic steroids, followed by Dr. Sharl Ma of endocrinology  . Gluten intolerance     August 2011 endoscopy with biopsy negative for celiac disease. Prior to this patient had elevated TTG IgG of  93 (normal <20). Dr Loreta Ave made clinical diagnosis of gluten intolerance based on diarrhea/symptoms from eating gluten. Needs GF diet.    Past Surgical History  Procedure Date  . Hemicolectomy      Right-sided  .  resection of ovarian cyst   . Cataract extraction      Left eye  . Knee arthroscopy      Left knee  . Appendectomy   . Carpal tunnel release   . Lumbar laminectomy   . Lumbar fusion   . Replacement total knee     left knee   No family history on file. History   Social History  . Marital Status: Widowed    Spouse Name: N/A    Number of Children: N/A  . Years of Education: N/A   Occupational History  . Not on file.   Social History Main Topics  . Smoking status: Former Smoker -- 1.0 packs/day for 24 years    Types: Cigarettes    Quit date: 08/01/1988  . Smokeless tobacco: Not on file  . Alcohol Use: No  . Drug Use: No  . Sexually Active: No   Other Topics Concern  . Not on file   Social History Narrative    Husband died of myeloma. Patient is to daughter's one of whom she lives with her.    A/P: 1. Hospital follow up for a UTI, resolving -  completes her last dose of Antibiotic this pm - personal hygiene reviewed  2. Left lear cerumen impaction -ear irrigated -instructed to use debrox at home and return if not improvement.

## 2011-08-26 ENCOUNTER — Encounter: Payer: Medicare Other | Admitting: Internal Medicine

## 2011-08-30 ENCOUNTER — Ambulatory Visit: Payer: Medicare Other | Admitting: Physical Therapy

## 2011-09-05 ENCOUNTER — Ambulatory Visit: Payer: Medicare Other | Admitting: Physical Therapy

## 2011-09-07 ENCOUNTER — Other Ambulatory Visit (HOSPITAL_COMMUNITY): Payer: Self-pay | Admitting: Internal Medicine

## 2011-09-09 ENCOUNTER — Ambulatory Visit: Payer: Medicare Other | Admitting: Physical Therapy

## 2011-10-01 ENCOUNTER — Encounter: Payer: Medicare Other | Admitting: Internal Medicine

## 2011-11-04 ENCOUNTER — Encounter: Payer: Self-pay | Admitting: Internal Medicine

## 2011-11-04 ENCOUNTER — Ambulatory Visit (INDEPENDENT_AMBULATORY_CARE_PROVIDER_SITE_OTHER): Payer: Medicare Other | Admitting: Internal Medicine

## 2011-11-04 VITALS — BP 109/72 | HR 84 | Temp 97.6°F | Ht 67.0 in | Wt 125.5 lb

## 2011-11-04 DIAGNOSIS — F329 Major depressive disorder, single episode, unspecified: Secondary | ICD-10-CM

## 2011-11-04 DIAGNOSIS — R609 Edema, unspecified: Secondary | ICD-10-CM

## 2011-11-04 DIAGNOSIS — N189 Chronic kidney disease, unspecified: Secondary | ICD-10-CM

## 2011-11-04 DIAGNOSIS — F3289 Other specified depressive episodes: Secondary | ICD-10-CM

## 2011-11-04 DIAGNOSIS — D472 Monoclonal gammopathy: Secondary | ICD-10-CM

## 2011-11-04 DIAGNOSIS — R600 Localized edema: Secondary | ICD-10-CM

## 2011-11-04 MED ORDER — FUROSEMIDE 20 MG PO TABS: 40.0000 mg | ORAL_TABLET | Freq: Every day | ORAL | Status: DC

## 2011-11-04 MED ORDER — TRAZODONE 25 MG HALF TABLET: 50.0000 mg | ORAL_TABLET | Freq: Every day | ORAL | Status: DC

## 2011-11-04 NOTE — Assessment & Plan Note (Signed)
The patient has been followed up with the hematology. She has a followup appointment on 02/07/12 with hematology. Will follow up the recommendations.

## 2011-11-04 NOTE — Progress Notes (Signed)
Patient ID: Rachel Bridges, female   DOB: [birth year], 78 y.o.   MRN: [id number]  Subjective:   Patient ID: Rachel Bridges female   DOB: 12-Apr-1925 78 y.o.   MRN: [id number]  HPI: [name] is a 78 y.o. with a past medical history as outlined below, who presents with a regular followup visit.  Patient has many chronic issues, but today she would like to address 2 issues particularly, i.e.  leg edema and poor sleep.  Regarding her leg edema, this is chronic issue. She reports that she is taking Lasix 20 mg twice a day. Recently her leg edema is slightly getting worse. She does not have shortness of breath, palpitation, chest pain or orthopnea. Patient has chronic renal insufficiency. She has been followed up with [facility]. She was last seen in June of 2013 per patient's daughter . She was told to continue her current medications and to followup in one year (I could not find the note from renal).  Regarding her sleep, patient has anxiety disorder and depression. She is currently taking Zoloft 25 mg daily. She said the Zoloft dosage is not high enough.  She has poor sleep. She wakes up a lot and can not stay in sleep.   Regarding her MGUS, she has been followed up wit hematology. She was seen last year and was told that her condition was stable. She has an appointment with hematology on 02/07/12 for followup visit.  Past Medical History  Diagnosis Date  . MGUS (monoclonal gammopathy of unknown significance)      [facility]  . Anxiety   . History of colon cancer      History of bowel  obstruction. on treatment with Colace and MiraLax.  followed by Dr. Loreta Ave and Dr. Purnell Shoemaker  . Depression   . Diverticulosis of colon   . Osteopenia   . Lumbar spinal stenosis   . Atrophic vaginitis   . Renal cyst   . History of tobacco abuse     at least 64 pack year. Started about age 70 and continued until about age 84.  Marland Kitchen Dermatitis   . Hip pain        History  . Actinic keratosis   . Dementia   . Adrenal suppression     central, on chronic steroids, followed by Dr. Sharl Ma of endocrinology  . Gluten intolerance     August 2011 endoscopy with biopsy negative for celiac disease. Prior to this patient had elevated TTG IgG of 93 (normal <20). Dr Loreta Ave made clinical diagnosis of gluten intolerance based on diarrhea/symptoms from eating gluten. Needs GF diet.    Current Outpatient Prescriptions  Medication Sig Dispense Refill  . aspirin 81 MG tablet Take 81 mg by mouth daily.       . calcium citrate-vitamin D (CITRACAL+D) 315-200 MG-UNIT per tablet Take 1 tablet by mouth 2 (two) times daily.      . cyanocobalamin (,VITAMIN B-12,) 1000 MCG/ML injection Inject 1 mL (1,000 mcg total) into the muscle every 30 (thirty) days.  1 mL    . ferrous fumarate (HEMOCYTE - 106 MG FE) 325 (106 FE) MG TABS Take 1 tablet by mouth 3 (three) times daily.      . furosemide (LASIX) 20 MG tablet Take 2 tablets (40 mg total) by mouth daily.  180 tablet  3  . hydrocortisone (CORTEF) 5 MG tablet Take three tablets by mouth in the morning (15mg ) and one tablet (  5mg ) in the afternoon each day.  120 tablet  3  . ondansetron (ZOFRAN) 4 MG tablet Take 4 mg by mouth daily as needed. For nausea      . paricalcitol (ZEMPLAR) 1 MCG capsule Take 1 mcg by mouth every Monday, Wednesday, and Friday.      . potassium chloride (K-DUR) 10 MEQ tablet Take 2 tablets (20 mEq total) by mouth 2 (two) times daily.  60 tablet  1  . traZODone (DESYREL) 25 mg TABS Take 1 tablet (50 mg total) by mouth at bedtime.  30 tablet  3   No family history on file. History   Social History  . Marital Status: Widowed    Spouse Name: N/A    Number of Children: N/A  . Years of Education: N/A   Social History Main Topics  . Smoking status: Former Smoker -- 1.0 packs/day for 24 years    Types: Cigarettes    Quit date: 08/01/1988  . Smokeless tobacco: None  . Alcohol Use: No  . Drug Use: No  .  Sexually Active: No   Other Topics Concern  . None   Social History Narrative    Husband died of myeloma. Patient is to daughter's one of whom she lives with her.   Review of Systems:  General: no fevers, chills, no changes in body weight, no changes in appetite. Has poor sleep. Skin: no rash HEENT: no blurry vision, hearing changes or sore throat Pulm: no dyspnea, coughing, wheezing CV: no chest pain, palpitations, shortness of breath Abd: no nausea/vomiting, abdominal pain, diarrhea/constipation GU: no dysuria, hematuria, polyuria Ext: no arthralgias, myalgias. Has leg edema bilaterally. Neuro: no weakness, numbness, or tingling   Objective:  Physical Exam: Filed Vitals:   11/04/11 1505  BP: 109/72  Pulse: 84  Temp: 97.6 F (36.4 C)  TempSrc: Oral  Height: 5\' 7"  (1.702 m)  Weight: 125 lb 8 oz (56.926 kg)   General: resting in bed, not in acute distress HEENT: PERRL, EOMI, no scleral icterus Cardiac: S1/S2, RRR, No murmurs, gallops or rubs Pulm: Good air movement bilaterally, Clear to auscultation bilaterally, No rales, wheezing, rhonchi or rubs. Abd: Soft,  nondistended, nontender, no rebound pain, no organomegaly, BS present Ext: No rashes,  2+DP/PT pulse bilaterally. Has 2+ pitting edema in legs bilaterally. Musculoskeletal: No joint deformities, erythema, or stiffness, ROM full and no nontender Skin: no rashes. No skin bruise. Neuro: alert and oriented X3, cranial nerves II-XII grossly intact, muscle strength 5/5 in all extremeties,  sensation to light touch intact.  Psych.: patient is not psychotic, no suicidal or hemocidal ideation.   Assessment & Plan:

## 2011-11-04 NOTE — Assessment & Plan Note (Signed)
Leg edema: It is most likely caused by chronic renal disease. It is less likely caused by congestive heart failure (patient doesn't have symptoms for congestive heart failure, such as palpitation, shortness of breath, orthopnea. Patient's lungs  Is clear to auscultation, her 2-D echo showed EF of 60-65% with grade 1 diastolic dysfunction on 05/08/11). It is also less likely due to liver dysfunction given normal AST and ALT on 08/11/28. Patient's is currently on 20 mg Lasix twice a day.  -  Will put her on Lasix to 40 daily. -  Continue potassium 10 twice a day  -  Check BMP

## 2011-11-04 NOTE — Patient Instructions (Signed)
1. I discontinued your Zoloft today. Please stop taking Zoloft from now 2. I put you on Trazodone for both depression and insomnia. Please start taking Trazodone from now. Please be very careful for fall. If you feel dizzy or confused, please call us. 3. Please take all medications as prescribed.  4. If you have worsening of your symptoms or new symptoms arise, please call the clinic ([phone]), or go to the ER immediately if symptoms are severe.

## 2011-11-04 NOTE — Assessment & Plan Note (Signed)
Depression with poor sleep: Patient's poor sleep is likely due to the combination of depression and anxiety. She is currently taking Zoloft 25 mg daily. Given her old age, I will not put her on Benzo or other sleeping pills, which may increase the risk for fall. I will switch patient from the Zoloft to trazodone. Patient was instructed to be very helpful for fall risk after starting trazodone.

## 2011-11-05 LAB — BASIC METABOLIC PANEL WITH GFR
CO2: 22 mEq/L (ref 19–32)
Chloride: 112 mEq/L (ref 96–112)
Glucose, Bld: 89 mg/dL (ref 70–99)
Potassium: 3.5 mEq/L (ref 3.5–5.3)
Sodium: 142 mEq/L (ref 135–145)

## 2011-11-16 ENCOUNTER — Emergency Department (HOSPITAL_COMMUNITY): Payer: Medicare Other

## 2011-11-16 ENCOUNTER — Inpatient Hospital Stay (HOSPITAL_COMMUNITY)
Admission: EM | Admit: 2011-11-16 | Discharge: 2011-11-22 | DRG: 392 | Disposition: A | Discharge status: Skilled Nursing Facility | Payer: Medicare Other | Attending: Internal Medicine | Admitting: Internal Medicine

## 2011-11-16 ENCOUNTER — Encounter (HOSPITAL_COMMUNITY): Payer: Self-pay | Admitting: *Deleted

## 2011-11-16 DIAGNOSIS — E2749 Other adrenocortical insufficiency: Secondary | ICD-10-CM | POA: Diagnosis present

## 2011-11-16 DIAGNOSIS — Z85038 Personal history of other malignant neoplasm of large intestine: Secondary | ICD-10-CM

## 2011-11-16 DIAGNOSIS — F3289 Other specified depressive episodes: Secondary | ICD-10-CM | POA: Diagnosis present

## 2011-11-16 DIAGNOSIS — I951 Orthostatic hypotension: Secondary | ICD-10-CM | POA: Diagnosis present

## 2011-11-16 DIAGNOSIS — F0390 Unspecified dementia without behavioral disturbance: Secondary | ICD-10-CM | POA: Diagnosis present

## 2011-11-16 DIAGNOSIS — Z981 Arthrodesis status: Secondary | ICD-10-CM

## 2011-11-16 DIAGNOSIS — G4700 Insomnia, unspecified: Secondary | ICD-10-CM | POA: Diagnosis present

## 2011-11-16 DIAGNOSIS — IMO0002 Reserved for concepts with insufficient information to code with codable children: Secondary | ICD-10-CM

## 2011-11-16 DIAGNOSIS — E860 Dehydration: Secondary | ICD-10-CM | POA: Diagnosis present

## 2011-11-16 DIAGNOSIS — N179 Acute kidney failure, unspecified: Secondary | ICD-10-CM | POA: Diagnosis present

## 2011-11-16 DIAGNOSIS — Z96659 Presence of unspecified artificial knee joint: Secondary | ICD-10-CM

## 2011-11-16 DIAGNOSIS — Z87891 Personal history of nicotine dependence: Secondary | ICD-10-CM

## 2011-11-16 DIAGNOSIS — D472 Monoclonal gammopathy: Secondary | ICD-10-CM | POA: Diagnosis present

## 2011-11-16 DIAGNOSIS — F411 Generalized anxiety disorder: Secondary | ICD-10-CM | POA: Diagnosis present

## 2011-11-16 DIAGNOSIS — Q619 Cystic kidney disease, unspecified: Secondary | ICD-10-CM

## 2011-11-16 DIAGNOSIS — Z79899 Other long term (current) drug therapy: Secondary | ICD-10-CM

## 2011-11-16 DIAGNOSIS — R627 Adult failure to thrive: Secondary | ICD-10-CM | POA: Diagnosis present

## 2011-11-16 DIAGNOSIS — K5732 Diverticulitis of large intestine without perforation or abscess without bleeding: Principal | ICD-10-CM | POA: Diagnosis present

## 2011-11-16 DIAGNOSIS — M949 Disorder of cartilage, unspecified: Secondary | ICD-10-CM | POA: Diagnosis present

## 2011-11-16 DIAGNOSIS — Z7982 Long term (current) use of aspirin: Secondary | ICD-10-CM

## 2011-11-16 DIAGNOSIS — F329 Major depressive disorder, single episode, unspecified: Secondary | ICD-10-CM

## 2011-11-16 DIAGNOSIS — M899 Disorder of bone, unspecified: Secondary | ICD-10-CM | POA: Diagnosis present

## 2011-11-16 DIAGNOSIS — K900 Celiac disease: Secondary | ICD-10-CM | POA: Diagnosis present

## 2011-11-16 LAB — URINE MICROSCOPIC-ADD ON

## 2011-11-16 LAB — COMPREHENSIVE METABOLIC PANEL
Alkaline Phosphatase: 124 U/L — ABNORMAL HIGH (ref 39–117)
BUN: 22 mg/dL (ref 6–23)
CO2: 19 mEq/L (ref 19–32)
Calcium: 8.5 mg/dL (ref 8.4–10.5)
GFR calc Af Amer: 41 mL/min — ABNORMAL LOW (ref >90)
GFR calc non Af Amer: 35 mL/min — ABNORMAL LOW (ref >90)
Glucose, Bld: 129 mg/dL — ABNORMAL HIGH (ref 70–99)
Total Protein: 7.1 g/dL (ref 6.0–8.3)

## 2011-11-16 LAB — OCCULT BLOOD, POC DEVICE: Fecal Occult Bld: NEGATIVE

## 2011-11-16 LAB — CBC
Hemoglobin: 13.2 g/dL (ref 12.0–15.0)
MCH: 29.2 pg (ref 26.0–34.0)
MCV: 87.4 fL (ref 78.0–100.0)
RBC: 4.52 MIL/uL (ref 3.87–5.11)
WBC: 8.2 10*3/uL (ref 4.0–10.5)

## 2011-11-16 LAB — URINALYSIS, ROUTINE W REFLEX MICROSCOPIC
Ketones, ur: NEGATIVE mg/dL
Nitrite: NEGATIVE
Specific Gravity, Urine: 1.017 (ref 1.005–1.030)
Urobilinogen, UA: 0.2 mg/dL (ref 0.0–1.0)
pH: 5.5 (ref 5.0–8.0)

## 2011-11-16 LAB — POCT I-STAT TROPONIN I: Troponin i, poc: 0.02 ng/mL (ref 0.00–0.08)

## 2011-11-16 MED ORDER — SODIUM CHLORIDE 0.9 % IV BOLUS (SEPSIS)
500.0000 mL | Freq: Once | INTRAVENOUS | Status: AC
  Administered 2011-11-16: 500 mL via INTRAVENOUS

## 2011-11-16 MED ORDER — IOHEXOL 300 MG/ML  SOLN
65.0000 mL | Freq: Once | INTRAMUSCULAR | Status: AC | PRN
  Administered 2011-11-16: 65 mL via INTRAVENOUS

## 2011-11-16 MED ORDER — HYDROCORTISONE SOD SUCCINATE 100 MG IJ SOLR
100.0000 mg | Freq: Once | INTRAMUSCULAR | Status: AC
  Administered 2011-11-16: 100 mg via INTRAVENOUS
  Filled 2011-11-16: qty 2

## 2011-11-16 MED ORDER — HYDROCORTISONE SOD SUCCINATE 100 MG IJ SOLR: 100.0000 mg | Freq: Once | INTRAMUSCULAR | Status: DC

## 2011-11-16 MED ORDER — IOHEXOL 300 MG/ML  SOLN
20.0000 mL | INTRAMUSCULAR | Status: AC
  Administered 2011-11-16: 20 mL via ORAL

## 2011-11-16 NOTE — H&P (Signed)
Hospital Admission Note Date: 11/17/2011  Patient name: Rachel Bridges Medical record number: [id number] Date of birth: 26-May-1924 Age: 78 y.o. Gender: female PCP: Lorretta Harp  Medical Service: IMTS  Attending physician:  Dr. Burns Spain     First Contact: [place], [place]                       Pager: [phone] Second Contact: Dr. Saralyn Pilar           Pager: [phone]  After 5 pm or weekends: 1st Contact: Pager: [phone] 2nd Contact: Pager: [phone]   Chief Complaint:  Abdominal pain, diarrhea  History of Present Illness:  78 y.o woman PMH colon cancer (s/p right hemicoloectomy w/o rad/chemo), pandiverticulosis, bowel obstruction, adrenal suppression (on chronic steroids), MGUS, gluten intolerance, and other PMH.  Pt presents with worsening abdominal pain and distention x 2 weeks.  Initially abdominal pain was intermittent but has become more constant.  Ab pain is in the lower ab 8-9/10 w/o radiation mostly but also has been in the upper abdomen.  Sensation is stabbing like.  Nothing has made the pain better.  The abdominal pain has been associated with diarrhea x 48 hours and nausea.  In the ED the diarrhea is worsening and with 4 yellow loose bowel movements noted within 1 hour.  Diarrhea was associated with IVF boluses in the ED.  Other associated sx's decreased appetite, weakness, gas, dizziness/lightheadedness, dysgeusia, urinary and bowel incontinence (since right hemicolectomy), increased urinary  Frequency, increased sleep.  The patient denies fever, chills, travel, sick contacts, vomiting, blood in stool or urine, h/a, dysuria, trauma.  Also, she reports sob x 2 days worse with exertion and she reports CP last week which is now resolved.    In the ED BP was 74/56 and pt responded to NS 500 cc bolus.  She also had a CT ab/pelvis w/ contrast showing:  Lung bases with fibrotic changes, small nodule within the right midlung is again noted but  measurement is degraded by motion  artifact.  Marked distension of the ascending and transverse  colon. The descending colon is relatively collapsed. Fluid filled sigmoid colon is identified. The rectosigmoid colon is more distended on the current study. Numerous colonic diverticula are present. A small pericardial effusion is present.  Numerous left retroperitoneal and renal cysts are again noted, stable appearance. Right renal cysts appear  stable. Small mesenteric lymph nodes appears stable. Overall impression with stable appearance of dilated large bowel loops, consistent with ileus, stricture, or chronic dysmotility.   Meds: ASA 81 mg qd Calcium Citrate-vitamin D 315-200 mg po 1 tab bid  B12 1000 mcg q 30 day Lasix 20 mg qd  Cortef 10 mg qam, 5 mg qpm Zofran 4 mg prn KCl  20 mEQ bid  Paricalcitol 1 mcg MWF Trazodone 50 mg qhs   Allergies: Allergies as of 11/16/2011 - Review Complete 11/16/2011  Allergen Reaction Noted  . Morphine and related Other (See Comments) 03/02/2011  . Oxycodone hcl (oxycodone hcl) Other (See Comments) 03/02/2011  . Pantoprazole (pantoprazole sodium) Nausea Only 03/02/2011   Past Medical History  Diagnosis Date  . MGUS (monoclonal gammopathy of unknown significance)      [facility]  . Anxiety   . History of colon cancer      History of bowel  obstruction. on treatment with Colace and MiraLax.  followed by Dr. Loreta Ave and Dr. Purnell Shoemaker  . Depression   . Diverticulosis  of colon   . Osteopenia   . Lumbar spinal stenosis   . Atrophic vaginitis   . Renal cyst   . History of tobacco abuse     at least 64 pack year. Started about age 30 and continued until about age 36.  Marland Kitchen Dermatitis   . Hip pain      History  . Actinic keratosis   . Dementia   . Adrenal suppression     central, on chronic steroids, followed by Dr. Sharl Ma of endocrinology  . Gluten intolerance     August 2011 endoscopy with biopsy negative for celiac disease. Prior to this patient had  elevated TTG IgG of 93 (normal <20). Dr Loreta Ave made clinical diagnosis of gluten intolerance based on diarrhea/symptoms from eating gluten. Needs GF diet.    Past Surgical History  Procedure Date  . Hemicolectomy No chemo/radiation Last CEA 02/2011 1.7 wnl     Right-sided  .  resection of ovarian cyst   . Cataract extraction      Left eye  . Knee arthroscopy      Left knee  . Appendectomy   . Carpal tunnel release   . Lumbar laminectomy   . Lumbar fusion   . Replacement total knee     left knee   FH:  Mother-DM Father died MM age 13 Brother died pancreatic cancer Sister 1 died ?cancer, DM Sister 2 died lung cancer  History   Social History  . Marital Status: Widowed    Spouse Name: N/A    Number of Children: 6 (4 boys, 2 girls)  . Years of Education: N/A   Occupational History  . Not on file.   Social History Main Topics  . Smoking status: Former Smoker    Types: Cigarettes    Quit date: 08/01/1988  . Smokeless tobacco: Not on file  . Alcohol Use: No  . Drug Use: No  . Sexually Active: No   Other Topics Concern  . Not on file   Social History Narrative    Husband died of myeloma. Patient is to daughter's one of whom she lives with her.   Patient lives with one of her daughters and her son in [employer].   Insurance via [employer] and hospital grant   Review of Systems: General: +fatigue, +decreased appetite,  denies fever, chills HEENT: denies h/a, +macular degeneration-->seeing spots, +dysgeusia CV: resolved CP x 1 week ago Lungs: sob x 2 days with exertion GI:+ abdominal pain and distention x 2 weeks, +diarrhea with yellow loose bowel movements, gas, denies vomitting Neuro:+ dizziness/lightheadedness, no h/o falls GU: urinary and bowel incontinence (since right hemicolectomy), increased urinary frequency, denies blood in stool or urine, denies dysuria Psych: increased sleep.   Other: denies travel, sick contacts  Physical Exam: Blood pressure 114/68, pulse  64, temperature 97.5 F (36.4 C), temperature source Oral, resp. rate 20, height 5\' 7"  (1.702 m), weight 125 lb 14.1 oz (57.1 kg), SpO2 96.00%.  General: cachectic appearance, lying in bed  HEENT: nl scleral icterus, mucous membranes dry CV: RRR no r/m/g S1/S2 noted  Lungs ctab Ab: soft, +distension, TTP RLQ>LLQ, nl bowel sounds Ext: no edema lower or upper ext b/l   Lab results: Basic Metabolic Panel:  Basename 11/16/11 1800 11/16/11 1603  NA -- 140  K -- 4.0  CL -- 106  CO2 -- 19  GLUCOSE -- 129*  BUN -- 22  CREATININE -- 1.35*  CALCIUM -- 8.5  MG 1.3* --  PHOS -- --  Liver Function Tests:  [facility] 11/16/11 1603  AST 18  ALT 11  ALKPHOS 124*  BILITOT 0.2*  PROT 7.1  ALBUMIN 2.7*    Basename 11/16/11 1800  LIPASE 20  AMYLASE --   CBC:  Basename 11/16/11 1603  WBC 8.2  NEUTROABS --  HGB 13.2  HCT 39.5  MCV 87.4  PLT 282   Urinalysis:  Basename 11/16/11 1853  COLORURINE YELLOW  LABSPEC 1.017  PHURINE 5.5  GLUCOSEU NEGATIVE  HGBUR NEGATIVE  BILIRUBINUR SMALL*  KETONESUR NEGATIVE  PROTEINUR 30*  UROBILINOGEN 0.2  NITRITE NEGATIVE  LEUKOCYTESUR TRACE*   Imaging results:  Ct Abdomen Pelvis W Contrast  11/16/2011  *RADIOLOGY REPORT*  Clinical Data: Abdominal distension and pain.  Symptoms for 2 weeks.  Diarrhea, nausea.  History of colon cancer, diverticulosis, renal cyst, dementia, adrenal suppression, hemicolectomy, appendectomy.  CT ABDOMEN AND PELVIS WITH CONTRAST  Technique:  Multidetector CT imaging of the abdomen and pelvis was performed following the standard protocol during bolus administration of intravenous contrast.  Contrast: 65mL OMNIPAQUE IOHEXOL 300 MG/ML  SOLN  Comparison: CT 08/01/2011  Findings: Images of the lung bases again show fibrotic changes. Small nodule within the right midlung is again noted but measurement is degraded by motion artifact.  Again noted is marked distension of the ascending and transverse colon.  The descending  colon is relatively collapsed.  Fluid filled sigmoid colon is identified.  The rectosigmoid colon is more distended on the current study. Numerous colonic diverticula are present.  A small pericardial effusion is present.  No focal abnormality identified within the liver, spleen, pancreas, or adrenal glands.  Numerous left retroperitoneal and renal cysts are again noted, stable appearance.  Right renal cysts appear stable. Small mesenteric lymph nodes appears stable.  The uterus is present.  No adnexal mass or free pelvic fluid identified.  IMPRESSION:  1.  Stable appearance of dilated large bowel loops, consistent with ileus, stricture, or chronic dysmotility. 2.  No evidence for abscess. 3.  Stable bilateral retroperitoneal cyst. 4.  Small pericardial effusion. 5.  Stable small mesenteric nodes.  Original Report Authenticated By: Patterson Hammersmith, M.D.    Other results: EKG resulted 11/16/11  Assessment & Plan by Problem: 78 y.o immunocompromised woman presenting with worsening abdominal pain, diarrhea, nausea.  1. Abdominal pain associated with diarrhea, nausea, dehydration, decreased po intake -Etiology could be 2/2 enteritis vs infection vs toxic megacolon -pt has h/o colon cancer s/p right hemicolectomy and bowel obstruction (GI following pt-Dr. Purnell Shoemaker) -CT ab pelvis resulted  -Ordered O&P, stool gm stain and cultures, C. Diff, FOBT neg for blood -Primary team to consider GI consult -Rx Zofran 4 mg q8 prn iv, NS 100 cc/hr, NPO, Tylenol 650 mg prn  2. Dizziness/Lightheadedness -May be secondary to decreased po and dehydration -orthostatics negative -hydrating with NS   3. Elevated Creatinine -Baseline <1.35 -Pt on NS 100 cc/hr -monitor BMP  4. Adrenal suppression -restarted home dose Cortef 10 mg qam and 5 mg qpm -primary team will need to increase home steroids 3 x strength for 3 days  5. Hx Gluten intolerance -when pt can tolerate po start gluten free diet   6.  Insomnia -Rx home Trazadone 50 mg qhs   7. Hx MGUS -pt following Hematology   8. Hx renal cysts -pt following [facility]  9. DVT px -Hep sq   10. F/E/N -100 cc/hr NS -NPO-will need gluten free diet when able to tolerate po  11. Dispo -Admit med-surg  Shirlee Latch [phone]

## 2011-11-16 NOTE — ED Notes (Signed)
Unable to give urine sample at this time.  

## 2011-11-16 NOTE — ED Notes (Signed)
Received bedside report from [place], [place].  Patient currently sitting up in bed; no respiratory or acute distress noted.  Patient updated on plan of care; informed patient that bed request has been put in and that we are currently waiting on a bed assignment.  Patient has no other questions or concerns at this time; will continue to monitor.

## 2011-11-16 NOTE — ED Notes (Signed)
Calling report now. 

## 2011-11-16 NOTE — ED Notes (Signed)
Reports abd pain x 2 weeks. Having abd distention, diarrhea and nausea. Hypotensive at triage.

## 2011-11-16 NOTE — ED Provider Notes (Addendum)
History     CSN: [id number]  Arrival date & time 11/16/11  1535   First MD Initiated Contact with Patient 11/16/11 1612      Chief Complaint  Patient presents with  . Abdominal Pain    (Consider location/radiation/quality/duration/timing/severity/associated sxs/prior treatment) HPI Pt has had 2 weeks of abd distension, pain, nausea and episodic diarrhea. No fever chills. No chest pain, SOB. Pt last has flatu this AM at 0230. Last BM yesterday. Pt has previous hx of bowel obstruction and had multiple abd surgeries. Pt is on chronic steroids for adrenal suppression. States she has been taking meds as prescribed.  Past Medical History  Diagnosis Date  . MGUS (monoclonal gammopathy of unknown significance)      [facility]  . Anxiety   . History of colon cancer      History of bowel  obstruction. on treatment with Colace and MiraLax.  followed by Dr. Loreta Ave and Dr. Purnell Shoemaker  . Depression   . Diverticulosis of colon   . Osteopenia   . Lumbar spinal stenosis   . Atrophic vaginitis   . Renal cyst   . History of tobacco abuse     at least 64 pack year. Started about age 31 and continued until about age 59.  Marland Kitchen Dermatitis   . Hip pain      History  . Actinic keratosis   . Dementia   . Adrenal suppression     central, on chronic steroids, followed by Dr. Sharl Ma of endocrinology  . Gluten intolerance     August 2011 endoscopy with biopsy negative for celiac disease. Prior to this patient had elevated TTG IgG of 93 (normal <20). Dr Loreta Ave made clinical diagnosis of gluten intolerance based on diarrhea/symptoms from eating gluten. Needs GF diet.     Past Surgical History  Procedure Date  . Hemicolectomy      Right-sided  .  resection of ovarian cyst   . Cataract extraction      Left eye  . Knee arthroscopy      Left knee  . Appendectomy   . Carpal tunnel release   . Lumbar laminectomy   . Lumbar fusion   . Replacement total knee     left  knee    History reviewed. No pertinent family history.  History  Substance Use Topics  . Smoking status: Former Smoker -- 1.0 packs/day for 24 years    Types: Cigarettes    Quit date: 08/01/1988  . Smokeless tobacco: Not on file  . Alcohol Use: No    OB History    Grav Para Term Preterm Abortions TAB SAB Ect Mult Living                  Review of Systems  Constitutional: Positive for fatigue. Negative for fever and chills.  Respiratory: Negative for shortness of breath and wheezing.   Cardiovascular: Negative for chest pain and palpitations.  Gastrointestinal: Positive for nausea, abdominal pain, diarrhea and abdominal distention. Negative for vomiting.  Genitourinary: Negative for dysuria.  Musculoskeletal: Negative for myalgias and back pain.  Skin: Negative for rash and wound.  Neurological: Negative for dizziness, weakness, light-headedness and numbness.    Allergies  Morphine and related; Oxycodone hcl; and Pantoprazole  Home Medications   No current outpatient prescriptions on file.  BP 102/62  Pulse 86  Temp 97.6 F (36.4 C) (Oral)  Resp 18  Ht 5\' 7"  (1.702 m)  Wt 128 lb  12 oz (58.4 kg)  BMI 20.16 kg/m2  SpO2 95%  Physical Exam  Nursing note and vitals reviewed. Constitutional: She is oriented to person, place, and time. She appears well-developed and well-nourished. No distress.  HENT:  Head: Normocephalic and atraumatic.  Mouth/Throat: Oropharynx is clear and moist.  Eyes: EOM are normal. Pupils are equal, round, and reactive to light.  Neck: Normal range of motion. Neck supple.  Cardiovascular: Normal rate and regular rhythm.   Pulmonary/Chest: Effort normal and breath sounds normal. No respiratory distress. She has no wheezes. She has no rales.  Abdominal: Soft. She exhibits distension. She exhibits no mass. There is tenderness. There is no rebound and no guarding.       Pt has distended abd with TTP in bl lower quadrants. No rebound or guarding.  Sparse bowel sounds  Musculoskeletal: Normal range of motion. She exhibits no edema and no tenderness.  Neurological: She is alert and oriented to person, place, and time.       5/5 motor, sensation intact  Skin: Skin is warm and dry. No rash noted. No erythema.  Psychiatric: She has a normal mood and affect. Her behavior is normal.    ED Course  Procedures (including critical care time)  Labs Reviewed  COMPREHENSIVE METABOLIC PANEL - Abnormal; Notable for the following:    Glucose, Bld 129 (*)     Creatinine, Ser 1.35 (*)     Albumin 2.7 (*)     Alkaline Phosphatase 124 (*)     Total Bilirubin 0.2 (*)     GFR calc non Af Amer 35 (*)     GFR calc Af Amer 41 (*)     All other components within normal limits  URINALYSIS, ROUTINE W REFLEX MICROSCOPIC - Abnormal; Notable for the following:    APPearance CLOUDY (*)     Bilirubin Urine SMALL (*)     Protein, ur 30 (*)     Leukocytes, UA TRACE (*)     All other components within normal limits  MAGNESIUM - Abnormal; Notable for the following:    Magnesium 1.3 (*)     All other components within normal limits  URINE MICROSCOPIC-ADD ON - Abnormal; Notable for the following:    Bacteria, UA FEW (*)     Casts HYALINE CASTS (*)     All other components within normal limits  BASIC METABOLIC PANEL - Abnormal; Notable for the following:    CO2 18 (*)     Glucose, Bld 107 (*)     Creatinine, Ser 1.21 (*)     Calcium 7.4 (*)     GFR calc non Af Amer 40 (*)     GFR calc Af Amer 46 (*)     All other components within normal limits  BASIC METABOLIC PANEL - Abnormal; Notable for the following:    Chloride 114 (*)     CO2 18 (*)     Calcium 7.9 (*)     GFR calc non Af Amer 52 (*)     GFR calc Af Amer 60 (*)     All other components within normal limits  CBC  LIPASE, BLOOD  POCT I-STAT TROPONIN I  OCCULT BLOOD, POC DEVICE  STOOL CULTURE  GRAM STAIN  CLOSTRIDIUM DIFFICILE BY PCR  OVA AND PARASITE EXAMINATION  BASIC METABOLIC PANEL    No results found.   1. Depression      Date: 02/01/2012  Rate: 71  Rhythm: normal sinus rhythm  QRS Axis:  normal  Intervals: QRS prolonged  ST/T Wave abnormalities: nonspecific T wave changes  Conduction Disutrbances:right bundle branch block  Narrative Interpretation:   Old EKG Reviewed: none available    MDM   Discussed with internal medicine resident who will admit. BP improved with steroids       Loren Racer, MD 11/18/11 1610  Loren Racer, MD 02/01/12 [phone]

## 2011-11-17 DIAGNOSIS — F329 Major depressive disorder, single episode, unspecified: Secondary | ICD-10-CM

## 2011-11-17 LAB — BASIC METABOLIC PANEL
BUN: 20 mg/dL (ref 6–23)
CO2: 18 mEq/L — ABNORMAL LOW (ref 19–32)
Chloride: 111 mEq/L (ref 96–112)
Creatinine, Ser: 1.21 mg/dL — ABNORMAL HIGH (ref 0.50–1.10)
GFR calc non Af Amer: 40 mL/min — ABNORMAL LOW (ref >90)
Glucose, Bld: 107 mg/dL — ABNORMAL HIGH (ref 70–99)
Potassium: 3.8 mEq/L (ref 3.5–5.1)

## 2011-11-17 MED ORDER — ONDANSETRON HCL 4 MG/2ML IJ SOLN: 4.0000 mg | Freq: Three times a day (TID) | INTRAMUSCULAR | Status: DC | PRN

## 2011-11-17 MED ORDER — HYDROCORTISONE 10 MG PO TABS
10.0000 mg | ORAL_TABLET | Freq: Once | ORAL | Status: DC
  Filled 2011-11-17: qty 1

## 2011-11-17 MED ORDER — HYDROCORTISONE 10 MG PO TABS
10.0000 mg | ORAL_TABLET | Freq: Every day | ORAL | Status: DC
  Administered 2011-11-17: 10 mg via ORAL
  Filled 2011-11-17 (×2): qty 1

## 2011-11-17 MED ORDER — HYDROCORTISONE 5 MG PO TABS
5.0000 mg | ORAL_TABLET | Freq: Every day | ORAL | Status: DC
  Filled 2011-11-17 (×2): qty 1

## 2011-11-17 MED ORDER — HYDROCORTISONE 5 MG PO TABS
45.0000 mg | ORAL_TABLET | Freq: Every day | ORAL | Status: DC
  Administered 2011-11-18 – 2011-11-22 (×5): 45 mg via ORAL
  Filled 2011-11-17 (×7): qty 1

## 2011-11-17 MED ORDER — TRAZODONE HCL 50 MG PO TABS
50.0000 mg | ORAL_TABLET | Freq: Every day | ORAL | Status: DC
  Administered 2011-11-17 – 2011-11-18 (×2): 50 mg via ORAL
  Filled 2011-11-17 (×4): qty 1

## 2011-11-17 MED ORDER — ACETAMINOPHEN 650 MG RE SUPP: 650.0000 mg | Freq: Four times a day (QID) | RECTAL | Status: DC | PRN

## 2011-11-17 MED ORDER — MAGNESIUM SULFATE 40 MG/ML IJ SOLN
2.0000 g | Freq: Once | INTRAMUSCULAR | Status: AC
  Administered 2011-11-17: 2 g via INTRAVENOUS
  Filled 2011-11-17: qty 50

## 2011-11-17 MED ORDER — ACETAMINOPHEN 325 MG PO TABS: 650.0000 mg | ORAL_TABLET | Freq: Four times a day (QID) | ORAL | Status: DC | PRN

## 2011-11-17 MED ORDER — HYDROCORTISONE 5 MG PO TABS
15.0000 mg | ORAL_TABLET | Freq: Every morning | ORAL | Status: AC
  Administered 2011-11-18 – 2011-11-19 (×2): 15 mg via ORAL
  Filled 2011-11-17 (×2): qty 1

## 2011-11-17 MED ORDER — SODIUM CHLORIDE 0.9 % IV SOLN
INTRAVENOUS | Status: DC
  Administered 2011-11-17 – 2011-11-20 (×5): via INTRAVENOUS

## 2011-11-17 MED ORDER — HEPARIN SODIUM (PORCINE) 5000 UNIT/ML IJ SOLN
5000.0000 [IU] | Freq: Three times a day (TID) | INTRAMUSCULAR | Status: DC
  Administered 2011-11-17 – 2011-11-22 (×14): 5000 [IU] via SUBCUTANEOUS
  Filled 2011-11-17 (×20): qty 1

## 2011-11-17 NOTE — Progress Notes (Signed)
Subjective: The patient complains of abdominal pain only when she coughs, and relates that the pain is in bilateral lower quadrants of the abdomen upon pointing to where it hurts. The patient states she has not been having any vomiting this morning, with only occasional and mild nausea. She denies any abdominal pain at rest this AM. She states that she has not had a bowel movement since being admitted last night.  Objective: Vital signs in last 24 hours: Filed Vitals:   11/16/11 2315 11/16/11 2345 11/17/11 0047 11/17/11 0629  BP: 102/59 103/63 114/68 105/58  Pulse: 60 61 64 61  Temp:  97.7 F (36.5 C) 97.5 F (36.4 C) 97.5 F (36.4 C)  TempSrc:  Oral Oral Oral  Resp: 21 21 20 18   Height:   5\' 7"  (1.702 m)   Weight:   125 lb 14.1 oz (57.1 kg)   SpO2: 97% 95% 96% 98%   Weight change:  No intake or output data in the 24 hours ending 11/17/11 1252  Physical exam: General: Awake, alert; no acute distress CV: regular rate and rhythm; no murmurs; bowel sounds appreciated upon auscultation of precordium Resp: clear to auscultation bilaterally; no wheezes, rales, or rhonchi Abd: marked distention; non-tender; soft; hyperactive bowel sounds Ext: 2+ pulses in all four extremities; no pretibial edema  Lab Results: Basic Metabolic Panel:  Lab 11/17/11 2952 11/16/11 1800 11/16/11 1603  NA 140 -- 140  K 3.8 -- 4.0  CL 111 -- 106  CO2 18* -- 19  GLUCOSE 107* -- 129*  BUN 20 -- 22  CREATININE 1.21* -- 1.35*  CALCIUM 7.4* -- 8.5  MG -- 1.3* --  PHOS -- -- --   Liver Function Tests:  Lab 11/16/11 1603  AST 18  ALT 11  ALKPHOS 124*  BILITOT 0.2*  PROT 7.1  ALBUMIN 2.7*    Lab 11/16/11 1800  LIPASE 20  AMYLASE --   CBC:  Lab 11/16/11 1603  WBC 8.2  NEUTROABS --  HGB 13.2  HCT 39.5  MCV 87.4  PLT 282   Urinalysis:  Lab 11/16/11 1853  COLORURINE YELLOW  LABSPEC 1.017  PHURINE 5.5  GLUCOSEU NEGATIVE  HGBUR NEGATIVE  BILIRUBINUR SMALL*  KETONESUR NEGATIVE    PROTEINUR 30*  UROBILINOGEN 0.2  NITRITE NEGATIVE  LEUKOCYTESUR TRACE*   Micro Results: Recent Results (from the past 240 hour(s))  CLOSTRIDIUM DIFFICILE BY PCR     Status: Normal   Collection Time   11/17/11  1:00 AM      Component Value Range Status Comment   C difficile by pcr NEGATIVE  NEGATIVE Final    Studies/Results: Ct Abdomen Pelvis W Contrast  11/16/2011  *RADIOLOGY REPORT*  Clinical Data: Abdominal distension and pain.  Symptoms for 2 weeks.  Diarrhea, nausea.  History of colon cancer, diverticulosis, renal cyst, dementia, adrenal suppression, hemicolectomy, appendectomy.  CT ABDOMEN AND PELVIS WITH CONTRAST  Technique:  Multidetector CT imaging of the abdomen and pelvis was performed following the standard protocol during bolus administration of intravenous contrast.  Contrast: 65mL OMNIPAQUE IOHEXOL 300 MG/ML  SOLN  Comparison: CT 08/01/2011  Findings: Images of the lung bases again show fibrotic changes. Small nodule within the right midlung is again noted but measurement is degraded by motion artifact.  Again noted is marked distension of the ascending and transverse colon.  The descending colon is relatively collapsed.  Fluid filled sigmoid colon is identified.  The rectosigmoid colon is more distended on the current study. Numerous colonic diverticula are  present.  A small pericardial effusion is present.  No focal abnormality identified within the liver, spleen, pancreas, or adrenal glands.  Numerous left retroperitoneal and renal cysts are again noted, stable appearance.  Right renal cysts appear stable. Small mesenteric lymph nodes appears stable.  The uterus is present.  No adnexal mass or free pelvic fluid identified.  IMPRESSION:  1.  Stable appearance of dilated large bowel loops, consistent with ileus, stricture, or chronic dysmotility. 2.  No evidence for abscess. 3.  Stable bilateral retroperitoneal cyst. 4.  Small pericardial effusion. 5.  Stable small mesenteric nodes.   Original Report Authenticated By: Patterson Hammersmith, M.D.   Medications: I have reviewed the patient's current medications. Scheduled Meds:   . heparin  5,000 Units Subcutaneous Q8H  . hydrocortisone  10 mg Oral Q0600  . hydrocortisone  5 mg Oral Q1400  . hydrocortisone sodium succinate  100 mg Intravenous Once  . iohexol  20 mL Oral Q1 Hr x 2  . magnesium sulfate 1 - 4 g bolus IVPB  2 g Intravenous Once  . sodium chloride  500 mL Intravenous Once  . traZODone  50 mg Oral QHS  . DISCONTD: hydrocortisone sodium succinate  100 mg Intravenous Once   Continuous Infusions:   . sodium chloride 100 mL/hr at 11/17/11 0119   PRN Meds:.acetaminophen, acetaminophen, iohexol, ondansetron (ZOFRAN) IV Assessment/Plan: Patient is an 78 y.o woman with PMH of colon cancer (s/p right hemicoloectomy w/o rad/chemo), pandiverticulosis, bowel obstruction, adrenal suppression (on chronic steroids) who presents with abdominal pain and distention x 2 weeks, as well as diarrhea and nausea.   1. Abdominal pain/distention - The patient has also had diarrhea and nausea with subsequent decreased PO intake for two weeks. Unclear etiology at this point, and differential includes infection, mechanical obstruction, toxic megacolon, and chronic dysmotility. Ileus or obstruction less likely as patient has positive bowel sounds on examination. Patient has h/o colon cancer s/p right hemicolectomy and bowel obstruction (GI following pt-Dr. Purnell Shoemaker). CT abd/pelvis on 11/16/2011 reveals dilated bowel loops which are unchanged since previous CT. Rectosigmoid colon appears more dilated than on previous CT. C diff pcr negative. FOBT negative. Awaiting results from O&P, stool culture, and gram stain. - continue IV NS   - Zofran PRN for nausea  2. AKI - Cr 1.35 on admission. Baseline Cr ~1.0. Likely secondary decreased PO intake of liquids/solids. Cr trending down (1.35 -> 1.21). - monitor I/O's - continue IVF  3. Adrenal  insufficiency - Patient was hypotensive on presentation, which was likely secondary decreased PO intake and diarrhea, but could have been worsened by adrenal insufficiency.  Patient is on chronic mineralocorticoid therapy - administer stress dose steroids, 3 x home dose for 3 days  4. Gluten intolerance - Biopsy in 2011 negative for Celiac's disease, however patient complains of gastrointestinal discomfort/symptoms after eating gluten-containing products. -when pt can tolerate po start gluten free diet   5. Insomnia - stable. -continue Trazadone 50 mg qhs   6. DVT PPx - heparin  7 . Diet - NPO. Advance to gluten free diet as tolerated.      LOS: 1 day   Marigny Borre R 11/17/2011, 12:52 PM

## 2011-11-17 NOTE — ED Notes (Signed)
Report given to Scarlette Calico, RN on 6N.  No further questions/concerns from RN; informed RN that she can call back with any questions/concerns once patient arrives to floor.  Preparing patient for transport.

## 2011-11-18 LAB — BASIC METABOLIC PANEL
BUN: 15 mg/dL (ref 6–23)
CO2: 18 mEq/L — ABNORMAL LOW (ref 19–32)
Calcium: 7.9 mg/dL — ABNORMAL LOW (ref 8.4–10.5)
Chloride: 114 mEq/L — ABNORMAL HIGH (ref 96–112)
GFR calc Af Amer: 60 mL/min — ABNORMAL LOW (ref >90)
Glucose, Bld: 89 mg/dL (ref 70–99)
Potassium: 3.5 mEq/L (ref 3.5–5.1)

## 2011-11-18 LAB — GRAM STAIN

## 2011-11-18 MED ORDER — CIPROFLOXACIN HCL 500 MG PO TABS
500.0000 mg | ORAL_TABLET | Freq: Two times a day (BID) | ORAL | Status: DC
  Administered 2011-11-18 – 2011-11-22 (×9): 500 mg via ORAL
  Filled 2011-11-18 (×11): qty 1

## 2011-11-18 MED ORDER — METRONIDAZOLE 500 MG PO TABS
500.0000 mg | ORAL_TABLET | Freq: Three times a day (TID) | ORAL | Status: DC
  Administered 2011-11-18 – 2011-11-22 (×13): 500 mg via ORAL
  Filled 2011-11-18 (×16): qty 1

## 2011-11-18 NOTE — H&P (Signed)
Internal Medicine Teaching Service Attending Note Date: 11/18/2011  Patient name: Rachel Bridges  Medical record number: [id number]  Date of birth: [birth year]  I have seen and evaluated Rachel Bridges and discussed their care with the Residency Team. The patient was seen and the H&P was written on 11/17/11 but due to connectivity issues, was not entered into EPIC until the 22nd. Please see Dr Alford Highland H&P for full details. Rachel Bridges presented with 2 weeks of increasing ABD distension, pain, anorexia, weakness, diarrhea, and DOE. Her diarrhea has resolved although she still feels fatigued and tired although this is more chronic than acute.   PMHx, allergies, meds, soc hx, and fam hx have been reviewed  Filed Vitals:   11/16/11 2345 11/17/11 0047 11/17/11 0629 11/18/11 0552  BP: 103/63 114/68 105/58 99/52  Pulse: 61 64 61 65  Temp: 97.7 F (36.5 C) 97.5 F (36.4 C) 97.5 F (36.4 C) 97.7 F (36.5 C)  TempSrc: Oral Oral Oral Oral  Resp: 21 20 18 18   Height:  5\' 7"  (1.702 m)    Weight:  125 lb 14.1 oz (57.1 kg)  128 lb 12 oz (58.4 kg)  SpO2: 95% 96% 98% 92%   NAD, sitting in recliner Mouth moist, 3 mm lesion left side of tip of tongue ABD soft tender L LQ, no rebound / guarding EXT no edema, feet warm, no lesions, sensation decreased on dorsal surface B  LABS and imaging reviewed  Assessment and Plan: I agree with the formulated Assessment and Plan with the following changes:   N//V/ABD pain/ D - exam and sxs were not c/w with ileus seen on CT. Pt was having BM even after admit and has BS. Her ABD exam has only mild tenderness. Stool studies inc C diff will be obtained to determine cause of her diarrhea. She is afebrile without leukocytosis so infxn less likely and empiric ABX have not been started. She has responded to IVF.   Hypotension - pt's BP was 74/56 on presentation to the ER and she has responded to IVF. Her BP is likely pre-renal as she has had increased GI losses but as  she also has adrenal insuff, could be mild adrenal crisis - stress dose steroids.  ARF - pre-renal 2/2 decreased perfusion 2/2 GI losses. Hydrate and follow creatinine  Adrenal insuff - stress dose steroids - three times home dose for 3 days.  Burns Spain, MD 7/22/20138:14 AM

## 2011-11-18 NOTE — Evaluation (Signed)
Occupational Therapy Evaluation Patient Details Name: Rachel Bridges MRN: [id number] DOB: 1924-10-31 Today's Date: 11/18/2011 Time: 0913-1000 OT Time Calculation (min): 47 min  OT Assessment / Plan / Recommendation Clinical Impression  This 78 yo admitted with abdominal pain and diarrhea presents to acute OT with problems below. Will benefit from acute OT with follow up HHOT with 24 hours supervision.    OT Assessment  Patient needs continued OT Services    Follow Up Recommendations  Home health OT;Supervision/Assistance - 24 hour    Barriers to Discharge None Pt reports someone is always with her at home  Equipment Recommendations  3 in 1 bedside comode    Recommendations for Other Services    Frequency  Min 2X/week    Precautions / Restrictions Precautions Precautions: Fall Restrictions Weight Bearing Restrictions: No   Pertinent Vitals/Pain Drop in BP from supine to sit (dizziness)and sit to stand (dizziness)    ADL  Eating/Feeding: Simulated;Independent Where Assessed - Eating/Feeding: Edge of bed Grooming: Simulated;Set up;Supervision/safety Where Assessed - Grooming: Unsupported sitting Upper Body Bathing: Simulated;Supervision/safety;Set up Where Assessed - Upper Body Bathing: Unsupported sitting Lower Body Bathing: Simulated;Moderate assistance Where Assessed - Lower Body Bathing: Supported sit to stand Upper Body Dressing: Simulated;Set up;Supervision/safety Where Assessed - Upper Body Dressing: Unsupported sitting Lower Body Dressing: Simulated;Moderate assistance Where Assessed - Lower Body Dressing: Supported sit to Pharmacist, hospital: Mining engineer Method: Sit to Barista:  (Bed towards window back to recliner) Toileting - Clothing Manipulation and Hygiene: Simulated;Minimal assistance Where Assessed - Engineer, mining and Hygiene: Standing Equipment Used: Gait belt;Rolling  walker Transfers/Ambulation Related to ADLs: Min A    OT Diagnosis: Generalized weakness  OT Problem List: Decreased strength;Decreased activity tolerance;Decreased knowledge of use of DME or AE;Cardiopulmonary status limiting activity OT Treatment Interventions: Self-care/ADL training;Therapeutic activities;Patient/family education;Balance training;DME and/or AE instruction   OT Goals Acute Rehab OT Goals OT Goal Formulation: With patient Time For Goal Achievement: 12/02/11 Potential to Achieve Goals: Good ADL Goals Pt Will Perform Grooming: with set-up;with supervision;Unsupported;Standing at sink (2 tasks) ADL Goal: Grooming - Progress: Goal set today Pt Will Perform Upper Body Bathing: with set-up;with supervision;Unsupported;Sitting at sink;Standing at sink ADL Goal: Upper Body Bathing - Progress: Goal set today Pt Will Perform Lower Body Bathing: with supervision;with set-up;Unsupported;Sitting at sink;Standing at sink ADL Goal: Lower Body Bathing - Progress: Goal set today Pt Will Perform Upper Body Dressing: with set-up;with supervision;Sit to stand from bed;Sit to stand from chair;Unsupported ADL Goal: Upper Body Dressing - Progress: Goal set today Pt Will Perform Lower Body Dressing: with set-up;with supervision;Sit to stand from bed;Sit to stand from chair;Unsupported ADL Goal: Lower Body Dressing - Progress: Goal set today Pt Will Transfer to Toilet: with supervision;Ambulation;Comfort height toilet;3-in-1;with DME ADL Goal: Toilet Transfer - Progress: Goal set today Pt Will Perform Toileting - Clothing Manipulation: Independently;Standing ADL Goal: Toileting - Clothing Manipulation - Progress: Goal set today Pt Will Perform Toileting - Hygiene: Independently;Sit to stand from 3-in-1/toilet ADL Goal: Toileting - Hygiene - Progress: Goal set today Pt Will Perform Tub/Shower Transfer: with supervision;Shower seat with back;Ambulation ADL Goal: Tub/Shower Transfer - Progress:  Goal set today  Visit Information  Last OT Received On: 11/18/11 Assistance Needed: +1 PT/OT Co-Evaluation/Treatment: Yes    Subjective Data  Subjective: I can't pick up things with my toes like I use to Patient Stated Goal: Did not ask   Prior Functioning  Vision/Perception  Home Living Lives With: Family;Daughter (and son in law) Available  Help at Discharge: Family;Available 24 hours/day Type of Home: House Home Access: Stairs to enter [employer] of Steps: 3 at the curb and then 1 up onto the porch Entrance Stairs-Rails: Left Home Layout: One level Bathroom Shower/Tub: Forensic scientist: Standard Bathroom Accessibility: Yes How Accessible: Accessible via walker Home Adaptive Equipment: Shower chair with back;Grab bars in shower;Hand-held shower hose;Walker - standard;Straight cane;Wheelchair - manual Additional Comments: May have a RW--"I just can't think" Prior Function Level of Independence: Independent with assistive device(s);Needs assistance Needs Assistance: Bathing;Dressing Bath: Moderate Dressing: Moderate Meal Prep:  (I do some when I want to) Light Housekeeping:  (I do some when I want to) Able to Take Stairs?: Yes Driving: No 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 if her walker has wheels, which side of the bed she gets up on)    Extremity/Trunk Assessment Right Upper Extremity Assessment RUE ROM/Strength/Tone: Within functional levels Left Upper Extremity Assessment LUE ROM/Strength/Tone: Within functional levels Right Lower Extremity Assessment RLE  ROM/Strength/Tone: WFL for tasks assessed RLE Sensation: Deficits (Pt reports numbness x ~7 months) RLE Coordination: Deficits RLE Coordination Deficits: pt tried to use her toes to pull off her socks and was unable  Left Lower Extremity Assessment LLE ROM/Strength/Tone: WFL for tasks assessed LLE Sensation: Deficits (Pt reports numbness x ~7 months) LLE Coordination: Deficits LLE Coordination Deficits: pt tried to use her toes to pull off her socks and was unable  Trunk Assessment Trunk Assessment: Kyphotic   Mobility Bed Mobility Bed Mobility: Rolling Right;Right Sidelying to Sit;Sitting - Scoot to [employer] of Bed Rolling Right: 4: Min assist Right Sidelying to Sit: 3: Mod assist;HOB flat Sitting - Scoot to Edge of Bed: 5: Supervision (due to dizziness) Details for Bed Mobility Assistance: pt with difficulty intitiating movement, very slow movements; vc for technique; assist to complete movements due to lack of strength Transfers Sit to Stand: 4: Min assist;With upper extremity assist;From bed Stand to Sit: 4: Min assist;With upper extremity assist;To chair/3-in-1 Details for Transfer Assistance: pt reports feeling like she's on a boat; no nystagmus noted; these symptoms did not worsen, however reported she felt lightheaded with walking   Exercise    Balance Balance Balance Assessed: Yes Static Sitting Balance Static Sitting - Balance Support: Right upper extremity supported;Feet supported Static Sitting - Level of Assistance: 5: Stand by assistance Static Standing Balance Static Standing - Balance Support: Bilateral upper extremity supported Static Standing - Level of Assistance: 4: Min assist  End of Session OT - End of Session Equipment Utilized During Treatment: Gait belt Activity Tolerance:  (limited by dizziness (drop in BP)) Patient left: in chair;with call bell/phone within reach Nurse Communication: Mobility status (+1 and pt does get dizzy)       Evette Georges  [phone] 11/18/2011, 12:29 PM

## 2011-11-18 NOTE — Evaluation (Signed)
Physical Therapy Evaluation Patient Details Name: Rachel Bridges MRN: [id number] DOB: 1925-02-04 Today's Date: 11/18/2011 Time: 4098-1191 PT Time Calculation (min): 25 min  PT Assessment / Plan / Recommendation Clinical Impression  Pt adm with abd pain and h/o diarrhea with dehydration and orthostasis. Pt reports she has 24 hr/day care at home and, if so, can go home with HHPT.    PT Assessment  Patient needs continued PT services    Follow Up Recommendations  Home health PT;Supervision/Assistance - 24 hour    Barriers to Discharge None      Equipment Recommendations  None recommended by PT    Recommendations for Other Services     Frequency Min 3X/week    Precautions / Restrictions Restrictions Weight Bearing Restrictions: No   Pertinent Vitals/Pain BP supine 113/65  HR 61 BP sit        100/55  HR 74 BP after walk  85/64  HR 64 *pt reporting no change in symptoms with sit to stand, yet became more dizzy with walking        Mobility  Bed Mobility Bed Mobility: Rolling Right;Right Sidelying to Sit;Sitting - Scoot to [place] of Bed Rolling Right: 4: Min assist Right Sidelying to Sit: 3: Mod assist;HOB flat Sitting - Scoot to Edge of Bed: 5: Supervision (due to dizziness) Details for Bed Mobility Assistance: pt with difficulty intitiating movement, very slow movements; vc for technique; assist to complete movements due to lack of strength Transfers Transfers: Sit to Stand;Stand to Sit Sit to Stand: 4: Min assist;With upper extremity assist;From bed Stand to Sit: 4: Min assist;With upper extremity assist;To chair/3-in-1 Details for Transfer Assistance: pt reports feeling like she's on a boat; no nystagmus noted; these symptoms did not worsen, however reported she felt lightheaded with walking Ambulation/Gait Ambulation/Gait Assistance: 4: Min assist Ambulation Distance (Feet): 20 Feet Assistive device: Rolling walker Ambulation/Gait Assistance Details: assist to manuever  RW; cues for safe use; guarding for safety Gait Pattern: Step-to pattern;Decreased stride length;Right foot flat;Left foot flat;Trunk flexed;Wide base of support    Exercises     PT Diagnosis: Difficulty walking  PT Problem List: Decreased activity tolerance;Decreased balance;Decreased mobility;Decreased knowledge of use of DME;Cardiopulmonary status limiting activity;Impaired sensation PT Treatment Interventions: DME instruction;Gait training;Stair training;Functional mobility training;Therapeutic activities;Therapeutic exercise;Balance training;Patient/family education   PT Goals Acute Rehab PT Goals PT Goal Formulation: With patient Time For Goal Achievement: 11/25/11 Potential to Achieve Goals: Good Pt will Roll Supine to Right Side: with supervision;with cues (comment type and amount) PT Goal: Rolling Supine to Right Side - Progress: Goal set today Pt will go Supine/Side to Sit: with supervision;with HOB 0 degrees;with cues (comment type and amount) PT Goal: Supine/Side to Sit - Progress: Goal set today Pt will go Sit to Stand: with supervision;with upper extremity assist PT Goal: Sit to Stand - Progress: Goal set today Pt will Ambulate: 51 - 150 feet;with supervision;with least restrictive assistive device;with cues (comment type and amount) PT Goal: Ambulate - Progress: Goal set today Pt will Go Up / Down Stairs: 3-5 stairs;with min assist;with least restrictive assistive device PT Goal: Up/Down Stairs - Progress: Goal set today  Visit Information  Last PT Received On: 11/18/11 Assistance Needed: +1 PT/OT Co-Evaluation/Treatment: Yes    Subjective Data  Subjective: "I can't pick up things with my toes like I used to"   Prior Functioning  Home Living Lives With: Family;Daughter (and son in law) Available Help at Discharge: Family;Available 24 hours/day Type of Home: House Home Access: Stairs  to enter Entrance Stairs-Number of Steps: 3 at the curb and then 1 up onto the  porch Entrance Stairs-Rails: Left Home Layout: One level Bathroom Shower/Tub: Tub/shower unit;Curtain Firefighter: Standard Bathroom Accessibility: Yes How Accessible: Accessible via walker Home Adaptive Equipment: Shower chair with back;Grab bars in shower;Hand-held shower hose;Walker - standard;Straight cane;Wheelchair - manual Additional Comments: May have a RW--"I just can't think" Prior Function Level of Independence: Independent with assistive device(s);Needs assistance Needs Assistance: Bathing;Dressing Bath: Moderate Dressing: Moderate Meal Prep:  (I do some when I want to) Light Housekeeping:  (I do some when I want to) Able to Take Stairs?: Yes Driving: No 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 if her walker has wheels, which side of the bed she gets up on)    Extremity/Trunk Assessment Right Lower Extremity Assessment RLE ROM/Strength/Tone: [facility] for tasks assessed RLE Sensation: Deficits (Pt reports numbness x ~7 months) RLE Coordination: Deficits RLE Coordination Deficits: pt tried to use her toes to pull off her socks and was unable  Left Lower Extremity Assessment LLE ROM/Strength/Tone: WFL for tasks assessed LLE Sensation: Deficits (Pt reports numbness x ~7 months) LLE Coordination: Deficits LLE Coordination Deficits: pt tried to use her toes to pull off her socks and was unable  Trunk Assessment Trunk Assessment: Kyphotic   Balance Balance Balance Assessed: Yes Static Sitting Balance Static Sitting - Balance Support: Right upper extremity supported;Feet supported Static Sitting - Level of  Assistance: 5: Stand by assistance Static Standing Balance Static Standing - Balance Support: Bilateral upper extremity supported Static Standing - Level of Assistance: 4: Min assist  End of Session PT - End of Session Equipment Utilized During Treatment: Gait belt Activity Tolerance: Treatment limited secondary to medical complications (Comment) (orthostasis, dizzy) Patient left: in chair;with call bell/phone within reach;Other (comment) (MD in room) Nurse Communication: Mobility status;Other (comment) (orthostasis)  GP     Averly Ericson 11/18/2011, 10:16 AM  Pager [phone]

## 2011-11-18 NOTE — Progress Notes (Signed)
Subjective: Patient complains of dizziness upon standing. Also states that bowel movements since yesterday have been mostly liquid. Denies any nausea, vomiting. Denies abdominal pain. Denies fever, chills.   No acute interval events. Objective: Vital signs in last 24 hours: Filed Vitals:   11/18/11 0552 11/18/11 0932 11/18/11 0940 11/18/11 0947  BP: 99/52 113/65 100/55 85/64  Pulse: 65 61 73 64  Temp: 97.7 F (36.5 C)     TempSrc: Oral     Resp: 18     Height:      Weight: 128 lb 12 oz (58.4 kg)     SpO2: 92%      Weight change: 2 lb 13.9 oz (1.3 kg)  Intake/Output Summary (Last 24 hours) at 11/18/11 1341 Last data filed at 11/18/11 1610  Gross per 24 hour  Intake    240 ml  Output    400 ml  Net   -160 ml   Physical exam:  General: Awake, alert; no acute distress  CV: regular rate and rhythm; no murmurs; bowel sounds appreciated upon auscultation of precordium  Resp: clear to auscultation bilaterally; no wheezes, rales, or rhonchi  Abd: marked distention; tender in RLQ/LLQ; soft; normoactive bowel sounds  Ext: 2+ pulses in all four extremities; no pretibial edema  Lab Results: Basic Metabolic Panel:  Lab 11/18/11 9604 11/17/11 0546 11/16/11 1800  NA 142 140 --  K 3.5 3.8 --  CL 114* 111 --  CO2 18* 18* --  GLUCOSE 89 107* --  BUN 15 20 --  CREATININE 0.98 1.21* --  CALCIUM 7.9* 7.4* --  MG -- -- 1.3*  PHOS -- -- --   Liver Function Tests:  Lab 11/16/11 1603  AST 18  ALT 11  ALKPHOS 124*  BILITOT 0.2*  PROT 7.1  ALBUMIN 2.7*    Lab 11/16/11 1800  LIPASE 20  AMYLASE --   CBC:  Lab 11/16/11 1603  WBC 8.2  NEUTROABS --  HGB 13.2  HCT 39.5  MCV 87.4  PLT 282   Urinalysis:  Lab 11/16/11 1853  COLORURINE YELLOW  LABSPEC 1.017  PHURINE 5.5  GLUCOSEU NEGATIVE  HGBUR NEGATIVE  BILIRUBINUR SMALL*  KETONESUR NEGATIVE  PROTEINUR 30*  UROBILINOGEN 0.2  NITRITE NEGATIVE  LEUKOCYTESUR TRACE*   Micro Results: Recent Results (from the past 240  hour(s))  STOOL CULTURE     Status: Normal (Preliminary result)   Collection Time   11/17/11  1:00 AM      Component Value Range Status Comment   Specimen Description STOOL   Final    Special Requests Immunocompromised   Final    Culture NO SUSPICIOUS COLONIES, CONTINUING TO HOLD   Final    Report Status PENDING   Incomplete   CLOSTRIDIUM DIFFICILE BY PCR     Status: Normal   Collection Time   11/17/11  1:00 AM      Component Value Range Status Comment   C difficile by pcr NEGATIVE  NEGATIVE Final    Studies/Results: Ct Abdomen Pelvis W Contrast  11/16/2011  *RADIOLOGY REPORT*  Clinical Data: Abdominal distension and pain.  Symptoms for 2 weeks.  Diarrhea, nausea.  History of colon cancer, diverticulosis, renal cyst, dementia, adrenal suppression, hemicolectomy, appendectomy.  CT ABDOMEN AND PELVIS WITH CONTRAST  Technique:  Multidetector CT imaging of the abdomen and pelvis was performed following the standard protocol during bolus administration of intravenous contrast.  Contrast: 65mL OMNIPAQUE IOHEXOL 300 MG/ML  SOLN  Comparison: CT 08/01/2011  Findings: Images of the  lung bases again show fibrotic changes. Small nodule within the right midlung is again noted but measurement is degraded by motion artifact.  Again noted is marked distension of the ascending and transverse colon.  The descending colon is relatively collapsed.  Fluid filled sigmoid colon is identified.  The rectosigmoid colon is more distended on the current study. Numerous colonic diverticula are present.  A small pericardial effusion is present.  No focal abnormality identified within the liver, spleen, pancreas, or adrenal glands.  Numerous left retroperitoneal and renal cysts are again noted, stable appearance.  Right renal cysts appear stable. Small mesenteric lymph nodes appears stable.  The uterus is present.  No adnexal mass or free pelvic fluid identified.  IMPRESSION:  1.  Stable appearance of dilated large bowel loops,  consistent with ileus, stricture, or chronic dysmotility. 2.  No evidence for abscess. 3.  Stable bilateral retroperitoneal cyst. 4.  Small pericardial effusion. 5.  Stable small mesenteric nodes.  Original Report Authenticated By: Patterson Hammersmith, M.D.   Medications: I have reviewed the patient's current medications. Scheduled Meds:   . ciprofloxacin  500 mg Oral BID  . heparin  5,000 Units Subcutaneous Q8H  . hydrocortisone  10 mg Oral Once  . hydrocortisone  15 mg Oral q morning - 10a  . hydrocortisone  45 mg Oral Q0600  . metroNIDAZOLE  500 mg Oral Q8H  . traZODone  50 mg Oral QHS  . DISCONTD: hydrocortisone  10 mg Oral Q0600  . DISCONTD: hydrocortisone  5 mg Oral Q1400   Continuous Infusions:   . sodium chloride 100 mL/hr at 11/18/11 0638   PRN Meds:.acetaminophen, acetaminophen, ondansetron (ZOFRAN) IV Assessment/Plan: Patient is an 78 y.o woman with PMH of colon cancer (s/p right hemicoloectomy w/o rad/chemo), pandiverticulosis, bowel obstruction, adrenal suppression (on chronic steroids) who presents with abdominal pain and distention x 2 weeks, as well as diarrhea and nausea.   1. Abdominal pain/distention - The patient has had diarrhea and nausea with subsequent decreased PO intake for two weeks. Symptoms are beginning to resolve. Still awaiting O&P, and final stool culture results (prelim negative). Diet has been advanced to full gluten free diet as of today. Obstruction or ileus highly unlikely as bowel sounds are positive, bowel movements ongoing.  Patient has history of pandiverticulosis. Although CT did not reveal any diverticulitis, patient's presentation is still concerning for this given her number of diverticulae and LLQ (sometimes RLQ) tenderness on exam. - continue IVF; d/c if patient continues to tolerate PO - begin cipro 500mg  bid - begin flagyl 500mg  q8  2. Adrenal insufficiency - On chronic steroid replacement. Patient was hypotensive on presentation, which  was likely secondary decreased PO intake and diarrhea, but could have been worsened by adrenal insufficiency. Patient remains orthostatic today, and also remains symptomatic (dizziness on sitting/standing).  - administer stress dose steroids, 3 x home dose for 3 days   3. Gluten intolerance - Biopsy in 2011 negative for Celiac's disease, however patient complains of gastrointestinal discomfort/symptoms after eating gluten-containing products.  - continue gluten free diet  4 Insomnia - Patient states that she slept well last night - continue Trazadone 50 mg qhs   5 DVT PPx - heparin   6. Diet - Gluten free diet    LOS: 2 days   Phoenyx Melka R 11/18/2011, 1:41 PM

## 2011-11-18 NOTE — Progress Notes (Signed)
Internal Medicine Teaching Service Attending Note Date: 11/18/2011  Patient name: Rachel Bridges  Medical record number: [id number]  Date of birth: 05-24-1924    This patient has been seen and discussed with the house staff. Please see their note for complete details. I concur with their findings with the following additions/corrections: [name] is giving contradictory history to different team members so it is not clear as to whether her BM's have been formed or are still liquid. Most recently she has stated that they are liquid which is different from her baseline. She was orthostatic on admit and is still a bit dizzy on standing indicating vol contraction, she still has a bit of LLQ tenderness, and has known extensive divertic in colon. The CT on admit didn't show a foci of diverticulitis but we treat this as an uncomplicated diverticulitis with ABX and see if that helps with the loose stools. I have reviewed old records and have not seen that she has been tried on Cipro ? metronidazole combo before. She will be advanced to a GF diet. Cont IVF for now.   BUTCHER,ELIZABETH 11/18/2011, 2:06 PM

## 2011-11-18 NOTE — Clinical Documentation Improvement (Addendum)
GENERIC DOCUMENTATION CLARIFICATION QUERY  THIS DOCUMENT IS NOT A PERMANENT PART OF THE MEDICAL RECORD  TO RESPOND TO THE THIS QUERY, FOLLOW THE INSTRUCTIONS BELOW:  1. If needed, update documentation for the patient's encounter via the notes activity.  2. Access this query again and click edit on the In [employer].  3. After updating, or not, click F2 to complete all highlighted (required) fields concerning your review. Select "additional documentation in the medical record" OR "no additional documentation provided".  4. Click Sign note button.  5. The deficiency will fall out of your In Basket *Please let us know if you are not able to complete this workflow by phone or e-mail (listed below).  Please update your documentation within the medical record to reflect your response to this query.                                                                                        11/18/11   Dear Dr. Saralyn Pilar and  Associates,  In a better effort to capture your patient's severity of illness, reflect appropriate length of stay and utilization of resources, a review of the patient medical record has revealed the following indicators.    Based on your clinical judgment, please clarify and document in a progress note and/or discharge summary the clinical condition associated with the following supporting information:  In responding to this query please exercise your independent judgment.  The fact that a query is asked, does not imply that any particular answer is desired or expected.  Pt is receiving Metronidazole & Cipro by mouth.  If known, please document possible, probable or suspected condition that is being treated in the progress notes along with the acuity.   Risk Factors: Hx Diverticulosis of colon   Signs & Symptoms: Abd pain, N/V/D  Treatment Metronidazole 500mg  po three times a day Cipro 500mg  po twice a day   You may use possible, probable, or suspect with  inpatient documentation. possible, probable, suspected diagnoses MUST be documented at the time of discharge  Reviewed: Query answered - Elenor Legato, M.D. 11/20/2011 5:03 PM    Thank Bonita Quin,     Rossie Muskrat RN, BSN  Clinical Documentation Specialist Pager:  [phone] [email or web address]  Health Information Management [place]

## 2011-11-19 ENCOUNTER — Inpatient Hospital Stay (HOSPITAL_COMMUNITY): Payer: Medicare Other

## 2011-11-19 LAB — BASIC METABOLIC PANEL
BUN: 16 mg/dL (ref 6–23)
CO2: 21 mEq/L (ref 19–32)
CO2: 21 mEq/L (ref 19–32)
Calcium: 7.7 mg/dL — ABNORMAL LOW (ref 8.4–10.5)
Chloride: 117 mEq/L — ABNORMAL HIGH (ref 96–112)
Chloride: 114 mEq/L — ABNORMAL HIGH (ref 96–112)
Creatinine, Ser: 1.16 mg/dL — ABNORMAL HIGH (ref 0.50–1.10)
GFR calc non Af Amer: 42 mL/min — ABNORMAL LOW (ref >90)
GFR calc non Af Amer: 39 mL/min — ABNORMAL LOW (ref >90)
Potassium: 3.6 mEq/L (ref 3.5–5.1)
Sodium: 143 mEq/L (ref 135–145)

## 2011-11-19 LAB — OVA AND PARASITE EXAMINATION: Ova and parasites: NONE SEEN

## 2011-11-19 LAB — CBC WITH DIFFERENTIAL/PLATELET
Basophils Absolute: 0 10*3/uL (ref 0.0–0.1)
Basophils Relative: 0 % (ref 0–1)
Eosinophils Absolute: 0 10*3/uL (ref 0.0–0.7)
MCH: 29.4 pg (ref 26.0–34.0)
MCHC: 33.1 g/dL (ref 30.0–36.0)
Neutro Abs: 3.7 10*3/uL (ref 1.7–7.7)
Neutrophils Relative %: 80 % — ABNORMAL HIGH (ref 43–77)
RBC: 3.81 MIL/uL — ABNORMAL LOW (ref 3.87–5.11)
RDW: 15.6 % — ABNORMAL HIGH (ref 11.5–15.5)

## 2011-11-19 LAB — LACTIC ACID, PLASMA: Lactic Acid, Venous: 1.8 mmol/L (ref 0.5–2.2)

## 2011-11-19 MED ORDER — METRONIDAZOLE 500 MG PO TABS: 500.0000 mg | ORAL_TABLET | Freq: Three times a day (TID) | ORAL | Status: DC

## 2011-11-19 MED ORDER — TRAZODONE HCL 50 MG PO TABS
50.0000 mg | ORAL_TABLET | ORAL | Status: DC
  Administered 2011-11-19: 50 mg via ORAL
  Filled 2011-11-19 (×2): qty 1

## 2011-11-19 MED ORDER — CIPROFLOXACIN HCL 500 MG PO TABS: 500.0000 mg | ORAL_TABLET | Freq: Two times a day (BID) | ORAL | Status: DC

## 2011-11-19 NOTE — Progress Notes (Signed)
Internal Medicine Teaching Service Attending Note Date: 11/19/2011  Patient name: AZHIA SIEFKEN  Medical record number: [id number]  Date of birth: 05-04-1924    This patient has been seen and discussed with the house staff. Please see their note for complete details. I concur with their findings with the following additions/corrections: Ms Nimmons was getting orthostatics when I came into the room. She was unsteady when she first stood up and the aide was by her side to steady her. PT has rec 24 hour assist and home PT/OT and stated that she bc dizzy / lightheaded with walking. Her BP fluctuates widely per records review. If she remains unsteady / symptomatic in regards to her orthostatic hypotension, we could consider Florinef. Her K is low / normal so would need to be followed closely. Her ECHO 2013 had a nl LV, EF, and grade I diastolic dysfxn. She had no LE edema on my exam.   Her BS were active, mixed normal with some high-pitched / tinkling BS. She states no BM in about 24 hrs. Therefore, will check plain film to R/O ileus / obstruction.   BUTCHER,ELIZABETH 11/19/2011, 2:26 PM

## 2011-11-19 NOTE — Progress Notes (Signed)
Subjective: Patient states that she felt well overnight. She states that she has not had a BM since yesterday's exam. She denies any nausea, and states that she has tolerated her meals very well in the interim. She cannot comment on dizziness on standing, as she had not stood up since previous exam.  Following encounter, patient had orthostatics performed, which were positive. Per the nurse who performed them, however, she had not complained of dizziness during these being performed.  No acute interval events.  Objective: Vital signs in last 24 hours: Filed Vitals:   11/19/11 0445 11/19/11 0953 11/19/11 0958 11/19/11 1003  BP: 88/63 84/46 79/47  64/53  Pulse: 47 66 89 57  Temp: 97.2 F (36.2 C) 98 F (36.7 C)    TempSrc: Oral Oral    Resp: 18 18 20 18   Height:      Weight: 132 lb 7.9 oz (60.1 kg)     SpO2: 100% 97% 98% 96%   Weight change: 3 lb 12 oz (1.7 kg)  Intake/Output Summary (Last 24 hours) at 11/19/11 1011 Last data filed at 11/19/11 1004  Gross per 24 hour  Intake   1880 ml  Output    250 ml  Net   1630 ml   Physical exam:  General: Awake, alert; no acute distress  CV: regular rate and rhythm; no murmurs; bowel sounds appreciated upon auscultation of precordium  Resp: Bibasilar rales present, L>R; otherwise clear to auscultation Abd: marked distention; tender in LLQ (has also had RLQ tenderness, location seems to be position dependent); soft; normoactive bowel sounds  Ext: 2+ pulses in all four extremities; 1+ pretibial edema  Lab Results: Basic Metabolic Panel:  Lab 11/19/11 1610 11/18/11 0932 11/16/11 1800  NA 145 142 --  K 3.6 3.5 --  CL 117* 114* --  CO2 21 18* --  GLUCOSE 98 89 --  BUN 16 15 --  CREATININE 1.16* 0.98 --  CALCIUM 7.7* 7.9* --  MG -- -- 1.3*  PHOS -- -- --   Liver Function Tests:  Lab 11/16/11 1603  AST 18  ALT 11  ALKPHOS 124*  BILITOT 0.2*  PROT 7.1  ALBUMIN 2.7*    Lab 11/16/11 1800  LIPASE 20  AMYLASE --   CBC:  Lab  11/16/11 1603  WBC 8.2  NEUTROABS --  HGB 13.2  HCT 39.5  MCV 87.4  PLT 282   Urinalysis:  Lab 11/16/11 1853  COLORURINE YELLOW  LABSPEC 1.017  PHURINE 5.5  GLUCOSEU NEGATIVE  HGBUR NEGATIVE  BILIRUBINUR SMALL*  KETONESUR NEGATIVE  PROTEINUR 30*  UROBILINOGEN 0.2  NITRITE NEGATIVE  LEUKOCYTESUR TRACE*   Micro Results: Recent Results (from the past 240 hour(s))  STOOL CULTURE     Status: Normal (Preliminary result)   Collection Time   11/17/11  1:00 AM      Component Value Range Status Comment   Specimen Description STOOL   Final    Special Requests Immunocompromised   Final    Culture NO SUSPICIOUS COLONIES, CONTINUING TO HOLD   Final    Report Status PENDING   Incomplete   GRAM STAIN     Status: Normal   Collection Time   11/17/11  1:00 AM      Component Value Range Status Comment   Specimen Description STOOL   Final    Special Requests Immunocompromised   Final    Gram Stain     Final    Value: NO WBC SEEN     NO  SQUAMOUS EPITHELIAL CELLS SEEN     ABUNDANT GRAM NEGATIVE RODS     FEW GRAM POSITIVE COCCI     IN PAIRS   Report Status 11/18/2011 FINAL   Final   CLOSTRIDIUM DIFFICILE BY PCR     Status: Normal   Collection Time   11/17/11  1:00 AM      Component Value Range Status Comment   C difficile by pcr NEGATIVE  NEGATIVE Final    Medications: I have reviewed the patient's current medications. Scheduled Meds:   . ciprofloxacin  500 mg Oral BID  . heparin  5,000 Units Subcutaneous Q8H  . hydrocortisone  10 mg Oral Once  . hydrocortisone  15 mg Oral q morning - 10a  . hydrocortisone  45 mg Oral Q0600  . metroNIDAZOLE  500 mg Oral Q8H  . traZODone  50 mg Oral QHS   Continuous Infusions:   . sodium chloride 100 mL/hr at 11/19/11 0323   PRN Meds:.acetaminophen, acetaminophen, ondansetron (ZOFRAN) IV Assessment/Plan: Patient is an 78 y.o woman with PMH of colon cancer (s/p right hemicoloectomy w/o rad/chemo), pandiverticulosis, bowel obstruction, adrenal  suppression (on chronic steroids) who presents with abdominal pain and distention x 2 weeks, as well as diarrhea and nausea.   1. Abdominal pain/distention - Pain at rest has resolved, although patient is TTP in LLQ when supine/inclined at 30 degrees. Suspect chronic dysmotility with possible superimposed diverticulitis.  - continue IVF - begin cipro 500mg  bid  - begin flagyl 500mg  q8   2. Adrenal insufficiency - On chronic steroid replacement. Patient was hypotensive on presentation, which was likely secondary decreased PO intake and diarrhea, but could have been worsened by adrenal insufficiency. Patient remains orthostatic today, although apparently not symptomatic per nurse report. Her resting BPs have also been low this AM. - administer stress dose steroids, 3 x home dose for 3 days   3. Gluten intolerance - Biopsy in 2011 negative for Celiac's disease, however patient complains of gastrointestinal discomfort/symptoms after eating gluten-containing products.  - continue gluten free diet   4 Insomnia - Patient states that she did not sleep well last night 2/2 noise in the hallway and receiving trazodone at 11pm per her report. - move trazodone 50mg  from 2200 to 2100  5 DVT PPx - heparin   6. Diet - Gluten free diet  7. Dispo - Hopeful for discharge today, as patient no longer is symptomatic (dizziness) upon standing. Orthostatics this AM were concerning, however, as BP fell to 64/53. Will continue to monitor and will reassess later in the day. If patient remains asymptomatic and BP is stable, will likely try to discharge patient home today with [facility] PT/OT (patient already has [facility] RN).   LOS: 3 days   Nocholas Damaso R 11/19/2011, 10:11 AM

## 2011-11-19 NOTE — Care Management Note (Signed)
  Page 2 of 2   11/21/2011     2:12:12 PM   CARE MANAGEMENT NOTE 11/21/2011  Patient:  Rachel Bridges, Rachel Bridges   Account Number:  [id number]  Date Initiated:  11/19/2011  Documentation initiated by:  Ronny Flurry  Subjective/Objective Assessment:     Action/Plan:   Anticipated DC Date:  11/21/2011   Anticipated DC Plan:  SKILLED NURSING FACILITY         Choice offered to / List presented to:  C-1 Patient        HH arranged  HH-2 PT  HH-3 OT      [facility] agency  [facility]   Status of service:  Completed, signed off Medicare Important Message given?   (If response is "NO", the following Medicare IM given date fields will be blank) Date Medicare IM given:   Date Additional Medicare IM given:    Discharge Disposition:  HOME W HOME HEALTH SERVICES  Per UR Regulation:  Reviewed for med. necessity/level of care/duration of stay  If discussed at Long Length of Stay Meetings, dates discussed:    Comments:    11-21-11 Patietn's BP dropped again during PT. Patient's daughter Rachel Bridges witnessed patient become symptomatic.  Patient and Rachel Bridges are now willing for short term SNF. Christina with SW and DR  McTyre aware.  Ronny Flurry RN BSN [phone]    11-20-11 Patient was set up with [name] for HHPT / OT . However, met with patient and 2 daughters. Daughter Rachel Bridges lives with patient . [name]'s cell phone number is [phone].  Daughters and patient wants outpatient PT .  Called MD , agreed with outpatient PT. PT referral form left on shadow chart for MD to sign. [facility] aware cancellation for home health. Patient has walker , 3 in 1 and bed rails at home . Plan to discharge home tomorrow 11-21-11.  Ronny Flurry RN BSN [phone]

## 2011-11-20 LAB — STOOL CULTURE

## 2011-11-20 LAB — BASIC METABOLIC PANEL
BUN: 13 mg/dL (ref 6–23)
Calcium: 7.6 mg/dL — ABNORMAL LOW (ref 8.4–10.5)
Chloride: 118 mEq/L — ABNORMAL HIGH (ref 96–112)
GFR calc Af Amer: 50 mL/min — ABNORMAL LOW (ref >90)
GFR calc non Af Amer: 43 mL/min — ABNORMAL LOW (ref >90)
Potassium: 3.9 mEq/L (ref 3.5–5.1)
Sodium: 147 mEq/L — ABNORMAL HIGH (ref 135–145)

## 2011-11-20 MED ORDER — TRAZODONE HCL 50 MG PO TABS
50.0000 mg | ORAL_TABLET | ORAL | Status: DC
  Administered 2011-11-20 – 2011-11-21 (×2): 50 mg via ORAL
  Filled 2011-11-20 (×3): qty 1

## 2011-11-20 MED ORDER — SENNOSIDES-DOCUSATE SODIUM 8.6-50 MG PO TABS
1.0000 | ORAL_TABLET | Freq: Two times a day (BID) | ORAL | Status: DC
  Administered 2011-11-20 – 2011-11-22 (×5): 1 via ORAL
  Filled 2011-11-20 (×5): qty 1

## 2011-11-20 NOTE — Progress Notes (Signed)
Physical Therapy Treatment Patient Details Name: Rachel Bridges MRN: [id number] DOB: 09/24/1924 Today's Date: 11/20/2011 Time: 1308-6578 PT Time Calculation (min): 27 min  PT Assessment / Plan / Recommendation Comments on Treatment Session  Pt continues to be orthostatic and is symptomatic, it is significantly limiting her ability to mobilize. D/c plan may have to be revisited if symptoms do not improve by d/c.  PT will continue to follow.    Follow Up Recommendations  Home health PT;Supervision/Assistance - 24 hour    Barriers to Discharge        Equipment Recommendations  3 in 1 bedside comode    Recommendations for Other Services    Frequency Min 3X/week   Plan Discharge plan remains appropriate;Frequency remains appropriate    Precautions / Restrictions Precautions Precautions: Fall Restrictions Weight Bearing Restrictions: No   Pertinent Vitals/Pain BP 136/67 supine       105/58 sitting       85/51 standing    Mobility  Bed Mobility Bed Mobility: Rolling Left;Left Sidelying to Sit;Sit to Supine Rolling Left: 4: Min assist Left Sidelying to Sit: 4: Min assist Sitting - Scoot to Edge of Bed: 5: Supervision Sit to Supine: 4: Min assist Details for Bed Mobility Assistance: pt still moving very slowly and wanted to slide legs off bed before rolling but then did not have abdominal strength to sit self up.  Pt's legs helped back into bed and pt given vc's to roll first, then needed only min A to push away from bed to achieve sitting.  Pt was more independent returning to bed with only min A with legs into bed. Transfers Transfers: Pharmacologist;Sit to Stand;Stand to Sit Sit to Stand: 4: Min assist;From bed;From chair/3-in-1;With upper extremity assist Stand to Sit: 4: Min assist;To chair/3-in-1;To bed;With upper extremity assist Stand Pivot Transfers: 4: Min assist Details for Transfer Assistance: vc's given for hand placement and for extending knees to achieve  full standing.  RW used for SPT, assist to RW for turning and vc's for directions, ie pt turned the wrong way to back up to bed. Ambulation/Gait Ambulation/Gait Assistance: 4: Min assist Ambulation Distance (Feet): 12 Feet Assistive device: Rolling walker Ambulation/Gait Assistance Details: 6', 4', 2', pt had to sit after these distances due to feeling dizzy and unsteady, min A for safety and frequent verbal cues to keep pt moving Gait Pattern: Step-to pattern;Decreased stride length;Right foot flat;Left foot flat;Trunk flexed;Wide base of support Gait velocity: very slow Stairs: No Wheelchair Mobility Wheelchair Mobility: No    Exercises     PT Diagnosis:    PT Problem List:   PT Treatment Interventions:     PT Goals Acute Rehab PT Goals PT Goal Formulation: With patient Time For Goal Achievement: 11/25/11 Potential to Achieve Goals: Good Pt will Roll Supine to Right Side: with supervision;with cues (comment type and amount) PT Goal: Rolling Supine to Right Side - Progress: Progressing toward goal Pt will go Supine/Side to Sit: with supervision;with HOB 0 degrees;with cues (comment type and amount) PT Goal: Supine/Side to Sit - Progress: Progressing toward goal Pt will go Sit to Stand: with supervision;with upper extremity assist PT Goal: Sit to Stand - Progress: Progressing toward goal Pt will Ambulate: 51 - 150 feet;with supervision;with least restrictive assistive device;with cues (comment type and amount) PT Goal: Ambulate - Progress: Not progressing Pt will Go Up / Down Stairs: 3-5 stairs;with min assist;with least restrictive assistive device  Visit Information  Last PT Received On: 11/20/11 Assistance  Needed: +1    Subjective Data  Subjective: I think I'm better today Patient Stated Goal: return home   Cognition  Overall Cognitive Status: Impaired Area of Impairment: Safety/judgement;Awareness of deficits Arousal/Alertness: Awake/alert Orientation Level: Oriented  X4 / Intact Behavior During Session: [facility] for tasks performed Safety/Judgement: Decreased safety judgement for tasks assessed Awareness of Deficits: pt reports dizziness when asked but does not volunteer information that she is dizzy and has to be told to sit down twice as she was obviously not feeling well but does not self-monitor well     Balance  Balance Balance Assessed: No  End of Session PT - End of Session Equipment Utilized During Treatment: Gait belt Activity Tolerance: Other (comment) (limited secondary to symptomatic orthostasis) Patient left: in bed;with call bell/phone within reach Nurse Communication: Mobility status;Other (comment) (orthostatic vitals reported)   GP   Lyanne Co, PT  Acute Rehab Services  [phone]   Lyanne Co 11/20/2011, 3:15 PM

## 2011-11-20 NOTE — Progress Notes (Deleted)
Clinical Social Work-On 7/23 CSW confirmed that pt was d/c to [facility]. This CSW notified MD/Supervisor of d/c and Covering SW Lovette Cliche provided [facility] with [place] number- Jodean Lima, [phone]

## 2011-11-20 NOTE — Progress Notes (Signed)
Internal Medicine Teaching Service Attending Note Date: 11/20/2011  Patient name: Rachel Bridges  Medical record number: [id number]  Date of birth: 02-18-1925    This patient has been seen and discussed with the house staff. Please see their note for complete details. I concur with their findings with the following additions/corrections: The two concerns today are 1. ABD - no BM in about 36 hours and decreased BS with occasional high pitched but then also nl BS. Pt is asymptomatic - eating well and no pain. We are cont a diet and will start a bowel regimen as we do not feel this represents an ileus / obstruction. 2. Dispo - pt is frail, dizzy, orthostatic, high fall risk. She and her family are deciding btw home and SNF.  Omario Ander 11/20/2011, 12:06 PM

## 2011-11-20 NOTE — Progress Notes (Signed)
  Clinical Social Work Department BRIEF PSYCHOSOCIAL ASSESSMENT 11/20/2011  Patient:  Rachel Bridges, Rachel Bridges     Account Number:  [id number]     Admit date:  11/16/2011  Clinical Social Worker:  Conley Simmonds  Date/Time:  11/20/2011 10:50 AM  Referred by:  Physician  Date Referred:  11/20/2011 Referred for  SNF Placement   Other Referral:   Interview type:  Patient Other interview type:   Pt family at bedside.    PSYCHOSOCIAL DATA Living Status:  FAMILY Admitted from facility:   Level of care:   Primary support name:  [name]-Daughter Primary support relationship to patient:   Degree of support available:   Very strong    CURRENT CONCERNS Current Concerns  Post-Acute Placement   Other Concerns:    SOCIAL WORK ASSESSMENT / PLAN CSW met with pt  and daughters at bedside to discuss disposition. Pt lives with daughter [name] and son in Social worker. Pt daughter [name] lives 9 houses up. Pt family is able to provide 24 hour support and would prefer that pt able to stay home-family's main concern is that pt does not participate in [facility] due to a lack of motivation.  CSW discussed variety of options including Short Term rehab, continued Home Health and Outpatient therapy.  Pt daughters and extended family provide 24 hour care and ultimate goal is for pt to return home.  CSW provided family with a list of [facility] Rehabs in the area and family will be discussing options for d/c.  More than likely pt will return home.   Assessment/plan status:  Psychosocial Support/Ongoing Assessment of Needs Other assessment/ plan:   Information/referral to community resources:   [employer] Options-    PATIENT'S/FAMILY'S RESPONSE TO PLAN OF CARE: Pt and family are very motivated to keep pt at home and are also very aware of pt need to increase activity and strength.  Pt and family have a long medical history with [facility] and are very familiar with ins and outs of caregiving.  Pt family would like to look into  outpatient PT/OT as they feel that one of pts barriers is motivation; the thought is that if pt is "forced" to get dressed and get out of the house with therapy this may increase her motivation and general mood. Pt family discussed providing incentives to pt for participating in therapy.  CSW will discuss with CM and f/u PRN-  CSW also provided pt daughters with contact information with  the agreement that if pt returns home and is not successful with participation we can look at placement from home-as long as it is within 30 days of hospitalization (with regards to insurance coverage)  Jodean Lima, [phone]

## 2011-11-20 NOTE — Progress Notes (Addendum)
Subjective: Patient complains of dizziness upon standing. States that early yesterday she felt like she was "on a rollercoaster" upon standing (corresponding to BP 65/53). States that this was by far the worst dizziness she has had here, and that the dizziness has improved upon standing following this one episode, although still present.   She denies any abdominal pain, nausea, or vomiting. She maintains that she has not had a bowel movement, but she is not sure how many days it has been since her last one.  She complains that she again did not sleep well last night, and states trazodone was not given until after 10PM (records indicate given at 2042).   She denies shortness of breath or chest pain. Complains of cough productive of white phlegm which is chronic and not worsened during hospital course. She has had difficulty maintaining O2 saturations >92 over the last 12 hours on RA, and has been placed on O2 by [place]. At time of exam today, she was on 1L O2.   Objective: Vital signs in last 24 hours: Filed Vitals:   11/19/11 1733 11/19/11 1737 11/19/11 2215 11/20/11 0603  BP: 95/65 83/48 129/69 131/68  Pulse: 68 72 72 67  Temp:   98.5 F (36.9 C) 97.5 F (36.4 C)  TempSrc:   Axillary Oral  Resp:   18 18  Height:      Weight:    141 lb 8 oz (64.184 kg)  SpO2:   91% 98%   Weight change: 9 lb 0.1 oz (4.084 kg)  Intake/Output Summary (Last 24 hours) at 11/20/11 0754 Last data filed at 11/20/11 0644  Gross per 24 hour  Intake 3246.67 ml  Output    775 ml  Net 2471.67 ml   Physical exam:  General: Awake, alert; no acute distress  CV: regular rate and rhythm; no murmurs Resp: Bibasilar rales present, L>>R; otherwise clear to auscultation  Abd: marked distention; tender in lower abdomen bilaterally to deep palpation (less tender than on previous exam); soft; bowel sounds high-pitched and irregular in frequency Ext: 2+ pulses in all four extremities; 1+ pretibial edema  Lab  Results: Basic Metabolic Panel:  Lab 11/20/11 1610 11/19/11 2048 11/16/11 1800  NA 147* 143 --  K 3.9 4.8 --  CL 118* 114* --  CO2 18* 21 --  GLUCOSE 94 128* --  BUN 13 14 --  CREATININE 1.13* 1.24* --  CALCIUM 7.6* 7.7* --  MG -- -- 1.3*  PHOS -- -- --   Liver Function Tests:  Lab 11/16/11 1603  AST 18  ALT 11  ALKPHOS 124*  BILITOT 0.2*  PROT 7.1  ALBUMIN 2.7*    Lab 11/16/11 1800  LIPASE 20  AMYLASE --   CBC:  Lab 11/19/11 1829 11/16/11 1603  WBC 4.6 8.2  NEUTROABS 3.7 --  HGB 11.2* 13.2  HCT 33.8* 39.5  MCV 88.7 87.4  PLT 235 282   Urinalysis:  Lab 11/16/11 1853  COLORURINE YELLOW  LABSPEC 1.017  PHURINE 5.5  GLUCOSEU NEGATIVE  HGBUR NEGATIVE  BILIRUBINUR SMALL*  KETONESUR NEGATIVE  PROTEINUR 30*  UROBILINOGEN 0.2  NITRITE NEGATIVE  LEUKOCYTESUR TRACE*   Micro Results: Recent Results (from the past 240 hour(s))  OVA AND PARASITE EXAMINATION     Status: Normal   Collection Time   11/17/11  1:00 AM      Component Value Range Status Comment   Specimen Description STOOL   Final    Special Requests Immunocompromised   Final  Ova and parasites NO OVA OR PARASITES SEEN   Final    Report Status 12/11/2011 FINAL   Final   STOOL CULTURE     Status: Normal   Collection Time   11/17/11  1:00 AM      Component Value Range Status Comment   Specimen Description STOOL   Final    Special Requests Immunocompromised   Final    Culture     Final    Value: NO SALMONELLA, SHIGELLA, CAMPYLOBACTER, YERSINIA, OR E.COLI 0157:H7 ISOLATED   Report Status 11/20/2011 FINAL   Final   GRAM STAIN     Status: Normal   Collection Time   11/17/11  1:00 AM      Component Value Range Status Comment   Specimen Description STOOL   Final    Special Requests Immunocompromised   Final    Gram Stain     Final    Value: NO WBC SEEN     NO SQUAMOUS EPITHELIAL CELLS SEEN     ABUNDANT GRAM NEGATIVE RODS     FEW GRAM POSITIVE COCCI     IN PAIRS   Report Status 11/18/2011 FINAL    Final   CLOSTRIDIUM DIFFICILE BY PCR     Status: Normal   Collection Time   11/17/11  1:00 AM      Component Value Range Status Comment   C difficile by pcr NEGATIVE  NEGATIVE Final    Studies/Results: Dg Abd 2 Views  12/11/2011  *RADIOLOGY REPORT*  Clinical Data: Tenderness left lower quadrant.  Hypotensive.  ABDOMEN - 2 VIEW  Comparison: 11/16/2011 CT.  Findings: Diffuse gas and fluid filled distended loops of small and large bowel.  Etiology indeterminate.  Sigmoid diverticuli noted. No free intraperitoneal air noted on the decubitus view.  IMPRESSION: Diffuse gas and fluid filled distended loops of small and large bowel.  Etiology indeterminate.  Sigmoid diverticuli noted.  No free intraperitoneal air noted on the decubitus view.  This is a call report.  Original Report Authenticated By: Fuller Canada, M.D.   Medications: I have reviewed the patient's current medications. Scheduled Meds:   . ciprofloxacin  500 mg Oral BID  . heparin  5,000 Units Subcutaneous Q8H  . hydrocortisone  10 mg Oral Once  . hydrocortisone  15 mg Oral q morning - 10a  . hydrocortisone  45 mg Oral Q0600  . metroNIDAZOLE  500 mg Oral Q8H  . traZODone  50 mg Oral Q24H  . DISCONTD: traZODone  50 mg Oral QHS  . DISCONTD: traZODone  50 mg Oral Q24H   Continuous Infusions:   . sodium chloride 100 mL/hr at 11/20/11 0606   PRN Meds:.acetaminophen, acetaminophen, ondansetron (ZOFRAN) IV Assessment/Plan: Patient is an 78 y.o woman with PMH of colon cancer (s/p right hemicoloectomy w/o rad/chemo), pandiverticulosis, bowel obstruction, adrenal suppression (on chronic steroids) who presents with abdominal pain and distention x 2 weeks, as well as diarrhea and nausea.   Abdominal pain/distention - Pain at rest has resolved, with only minimal tenderness in lower abdomen bilaterally upon deep palpation as of today. Suspect chronic dysmotility with possible superimposed diverticulitis.  Due to high-pitched bowel sounds,  hypotension, and lack of BM, abdominal xray was performed on 11-Dec-2022. It did not reveal any free air or other findings necessitating emergent intervention. Lactate was within normal limits. CBC within normal limits.  - continue IVF  - continue cipro 500mg  bid  - continue flagyl 500mg  q8   Suspected diverticulitis - Patient has  history of pandiverticulosis. Due to symptoms abdominal pain/diarrhea, and physical exam finding of LLQ abdominal tenderness which had been present on previous physical exam, the patient was assessed as likely to have developed some component of diverticulitis, although CT of the abdomen performed earlier in the hospital course did not reveal any discrete inflammation. - continue cipro - continue flagyl  Hypotension -  Patient remains orthostatic as of 7/23, with standing BP as low as 64/53. The patient states that this is a chronic issue which has caused her to fall at home in the past, but is worse during this hospital course. Fludrocortisone will be a possibility for this patient, but as she currently has 1+ pretibial edema, bibasilar rales, and sodium/potassium frequently on threshold of hypernatremia/hypokalemia, we will pend decision on beginning fludrocortisone therapy following AM orthostats (particularly as patient's supine BP is ~130/70 this AM).  - repeat orthostatics this AM  Adrenal insufficiency - On chronic steroid replacement. Patient was hypotensive on presentation, which is likely secondary decreased PO intake and diarrhea, but might be worsened by adrenal insufficiency. - continue stress steroids until hypotension resolves - continue daily BMET  Gluten intolerance - Biopsy in 2011 negative for Celiac's disease, however patient complains of gastrointestinal discomfort/symptoms after eating gluten-containing products.  - continue gluten free diet   Insomnia - Patient states that she did not sleep well last night 2/2 receiving trazodone at 10pm per her report.  Appears from records that she received it around 9PM. Patient states she usually takes it 4 hours before bed at home, and that it takes this long to "kick in".  - move trazodone 50mg  from 2100 to 1900   DVT PPx - heparin  Diet - Gluten free diet   Dispo - Discharge pending resolution of symptomatic orthostatic hypotension.      LOS: 4 days   Allyne Hebert R 11/20/2011, 7:54 AM

## 2011-11-20 NOTE — Discharge Summary (Signed)
Patient Name:  Rachel Bridges MRN: [id number]  PCP: Lars Mage, MD DOB:  [birth year]       Date of Admission:  76/20/2013  Date of Discharge:  11/22/2011      Attending Physician: Dr. Burns Spain, MD        DISCHARGE DIAGNOSES: Abdominal pain  Etiology unknown  Freq past exacerbations  Empiric treatment with ABX for diverticulitis with improvement in her symptoms  Orthostatic Hypotension  Did not improve with IVF  Florinef started  Adrenal insufficiency  On both glucocorticoid and mineralocorticoid replacment Acute kidney injury Gluten intolerance Insomnia   DISPOSITION AND FOLLOW-UP: NICHOLAS OSSA is to follow-up with the listed providers as detailed below, at which time, the following should be addressed:   1. IM Clinic: please follow-up on patient's initiation on fludrocortisone for orthostatic hypotension. Electrolytes should be followed for this medication, particularly as the patient is on chronic steroid replacement for central adrenal insufficiency. Volume status will also be important to follow with this patient, and to note, her lasix was held at time of discharge due to concerns for orthostatic hypotension. 2. Labs / imaging needed: BMET 3. Pending labs/ test needing follow-up: N/A  Follow-up Information    Follow up with Vernice Jefferson, MD on 12/04/2011. (INTERNAL MEDICINE - Your appointment is on August 7,2013 at 11:15 AM)    Contact information:   [address] [place] [address] [phone]       Follow up with SNF Physician in 5 days. (re-assess need for lasix and follow-up on volume status given patient's mild volume overload and orthostatic hypotension. would also benefit from monitoring of BMET  due to her treatment with cortef and fludrocortisone)         Discharge Orders    Future Appointments: Provider: Department: Dept Phone: Center:   12/04/2011 11:15 AM Alanson Puls, MD [facility] [phone] [facility]   02/07/2012 12:00 PM Delcie Roch [facility] [phone] None   02/14/2012 9:00 AM Laurice Record, MD [facility] [phone] None     Future Orders Please Complete By Expires   Diet - low sodium heart healthy      Diet - low sodium heart healthy      Increase activity slowly      Call MD for:  extreme fatigue      Call MD for:  persistant dizziness or light-headedness      Call MD for:  severe uncontrolled pain      Call MD for:  temperature >100.4      Call MD for:  persistant nausea and vomiting      Increase activity slowly        DISCHARGE MEDICATIONS: Medication List  As of 11/22/2011  1:12 PM   STOP taking these medications         furosemide 20 MG tablet         TAKE these medications         aspirin 81 MG tablet   Take 81 mg by mouth daily.      calcium citrate-vitamin D 315-200 MG-UNIT per tablet   Commonly known as: CITRACAL+D   Take 1 tablet by mouth 2 (two) times daily.      ciprofloxacin 500 MG tablet   Commonly known as: CIPRO   Take 1 tablet (500 mg total) by mouth 2 (two) times daily.      cyanocobalamin 1000 MCG/ML injection   Commonly  known as: (VITAMIN B-12)   Inject 1 mL (1,000 mcg total) into the muscle every 30 (thirty) days.      ferrous fumarate 325 (106 FE) MG Tabs   Commonly known as: HEMOCYTE - 106 mg FE   Take 1 tablet by mouth 3 (three) times daily.      fludrocortisone 0.1 MG tablet   Commonly known as: FLORINEF   Take 1 tablet (0.1 mg total) by mouth daily.      hydrocortisone 10 MG tablet   Commonly known as: CORTEF   Take 1 tablet (10 mg total) by mouth daily.      hydrocortisone 5 MG tablet   Commonly known as: CORTEF   Take 1 tablet (5 mg total) by mouth daily.      metroNIDAZOLE 500 MG tablet   Commonly known as: FLAGYL   Take 1 tablet (500 mg total) by mouth every 8 (eight) hours.      ondansetron 4 MG tablet   Commonly known as: ZOFRAN   Take 4 mg by mouth daily as needed. For nausea      potassium  chloride 10 MEQ tablet   Commonly known as: K-DUR   Take 2 tablets (20 mEq total) by mouth 2 (two) times daily.      senna-docusate 8.6-50 MG per tablet   Commonly known as: Senokot-S   Take 1 tablet by mouth 2 (two) times daily.      traZODone 25 mg Tabs   Commonly known as: DESYREL   Take 1 tablet (50 mg total) by mouth at bedtime.      ZEMPLAR 1 MCG capsule   Generic drug: paricalcitol   Take 1 mcg by mouth every Monday, Wednesday, and Friday.           CONSULTS:  N/A  PROCEDURES PERFORMED:  Ct Abdomen Pelvis W Contrast  11/16/2011  *RADIOLOGY REPORT*  Clinical Data: Abdominal distension and pain.  Symptoms for 2 weeks.  Diarrhea, nausea.  History of colon cancer, diverticulosis, renal cyst, dementia, adrenal suppression, hemicolectomy, appendectomy.  CT ABDOMEN AND PELVIS WITH CONTRAST  Technique:  Multidetector CT imaging of the abdomen and pelvis was performed following the standard protocol during bolus administration of intravenous contrast.  Contrast: 65mL OMNIPAQUE IOHEXOL 300 MG/ML  SOLN  Comparison: CT 08/01/2011  Findings: Images of the lung bases again show fibrotic changes. Small nodule within the right midlung is again noted but measurement is degraded by motion artifact.  Again noted is marked distension of the ascending and transverse colon.  The descending colon is relatively collapsed.  Fluid filled sigmoid colon is identified.  The rectosigmoid colon is more distended on the current study. Numerous colonic diverticula are present.  A small pericardial effusion is present.  No focal abnormality identified within the liver, spleen, pancreas, or adrenal glands.  Numerous left retroperitoneal and renal cysts are again noted, stable appearance.  Right renal cysts appear stable. Small mesenteric lymph nodes appears stable.  The uterus is present.  No adnexal mass or free pelvic fluid identified.  IMPRESSION:  1.  Stable appearance of dilated large bowel loops, consistent with  ileus, stricture, or chronic dysmotility. 2.  No evidence for abscess. 3.  Stable bilateral retroperitoneal cyst. 4.  Small pericardial effusion. 5.  Stable small mesenteric nodes.  Original Report Authenticated By: Patterson Hammersmith, M.D.   Dg Abd 2 Views  11/19/2011  *RADIOLOGY REPORT*  Clinical Data: Tenderness left lower quadrant.  Hypotensive.  ABDOMEN - 2 VIEW  Comparison: 11/16/2011 CT.  Findings: Diffuse gas and fluid filled distended loops of small and large bowel.  Etiology indeterminate.  Sigmoid diverticuli noted. No free intraperitoneal air noted on the decubitus view.  IMPRESSION: Diffuse gas and fluid filled distended loops of small and large bowel.  Etiology indeterminate.  Sigmoid diverticuli noted.  No free intraperitoneal air noted on the decubitus view.  This is a call report.  Original Report Authenticated By: Fuller Canada, M.D.     ADMISSION DATA: H&P: 78 y.o woman PMH colon cancer (s/p right hemicoloectomy w/o rad/chemo), pandiverticulosis, bowel obstruction, adrenal suppression (on chronic steroids), MGUS, gluten intolerance, and other PMH. Pt presents with worsening abdominal pain and distention x 2 weeks. Initially abdominal pain was intermittent but has become more constant. Ab pain is in the lower ab 8-9/10 w/o radiation mostly but also has been in the upper abdomen. Sensation is stabbing like. Nothing has made the pain better. The abdominal pain has been associated with diarrhea x 48 hours and nausea. In the ED the diarrhea is worsening and with 4 yellow loose bowel movements noted within 1 hour. Diarrhea was associated with IVF boluses in the ED. Other associated sx's decreased appetite, weakness, gas, dizziness/lightheadedness, dysgeusia, urinary and bowel incontinence (since right hemicolectomy), increased urinary Frequency, increased sleep. The patient denies fever, chills, travel, sick contacts, vomiting, blood in stool or urine, h/a, dysuria, trauma. Also, she reports sob  x 2 days worse with exertion and she reports CP last week which is now resolved.  In the ED BP was 74/56 and pt responded to NS 500 cc bolus. She also had a CT ab/pelvis w/ contrast showing:  Lung bases with fibrotic changes, small nodule within the right midlung is again noted but measurement is degraded by motion artifact. Marked distension of the ascending and transverse colon. The descending colon is relatively collapsed. Fluid filled sigmoid colon is identified. The rectosigmoid colon is more distended on the current study. Numerous colonic diverticula are present. A small pericardial effusion is present. Numerous left retroperitoneal and renal cysts are again noted, stable appearance. Right renal cysts appear stable. Small mesenteric lymph nodes appears stable. Overall impression with stable appearance of dilated large bowel loops, consistent with ileus, stricture, or chronic dysmotility.   Physical Exam: Blood pressure 114/68, pulse 64, temperature 97.5 F (36.4 C), temperature source Oral, resp. rate 20, height 5\' 7"  (1.702 m), weight 125 lb 14.1 oz (57.1 kg), SpO2 96.00%.  General: cachectic appearance, lying in bed  HEENT: nl scleral icterus, mucous membranes dry  CV: RRR no r/m/g S1/S2 noted  Lungs ctab  Ab: soft, +distension, TTP RLQ>LLQ, nl bowel sounds  Ext: no edema lower or upper ext b/l   Labs: Basic Metabolic Panel:   Basename  11/16/11 1800  11/16/11 1603   NA  --  140   K  --  4.0   CL  --  106   CO2  --  19   GLUCOSE  --  129*   BUN  --  22   CREATININE  --  1.35*   CALCIUM  --  8.5   MG  1.3*  --   PHOS  --  --    Liver Function Tests:   Basename  11/16/11 1603   AST  18   ALT  11   ALKPHOS  124*   BILITOT  0.2*   PROT  7.1   ALBUMIN  2.7*     Basename  11/16/11 1800  LIPASE  20   AMYLASE  --    CBC:   Basename  11/16/11 1603   WBC  8.2   NEUTROABS  --   HGB  13.2   HCT  39.5   MCV  87.4   PLT  282    Urinalysis:   Basename  11/16/11 1853    COLORURINE  YELLOW   LABSPEC  1.017   PHURINE  5.5   GLUCOSEU  NEGATIVE   HGBUR  NEGATIVE   BILIRUBINUR  SMALL*   KETONESUR  NEGATIVE   PROTEINUR  30*   UROBILINOGEN  0.2   NITRITE  NEGATIVE   LEUKOCYTESUR  TRACE*    HOSPITAL COURSE: Abdominal pain - PMH of colon cancer with right hemicolectomy (no chemo/rads). Patient has history of small and large bowel dilatation over the last several years, which is chronic and stable. On this admission, her abdominal pain was in the lower abdomen, and was associated with recently worsened diarrhea (patient has mild diarrhea at baseline). Abdominal pain and TTP of lower abdomen (LLQ>RLQ) resolved soon after beginning ciprofloxacin and metronidazole for suspected diverticulitis (negative on CT abdomen, but high clinical suspicion given LLQ tenderness and pandiverticluosis). These symptoms did not recur for the duration of her hospital course. The patient suffered from constipation for ~48 hours during her hospital course, but this was resolved quickly after beginning docusate therapy, which the patient was given a prescription for at discharge. The patient was also discharged on metronidazole and ciprofloxacin, for which she will complete a 14 day course (including days she received these meds while an inpatient). Etiology of abdominal pain still undetermined, but likely gastritis and/or diverticulitis.  Hypotension - Patient was hypotensive on presentation, with BP as low as 74/56 on sitting. PMH of adrenal insuffiency, as described below. Most of the patient's hypotensive was orthostatic in nature, and occurred with either sitting or standing. The patient would generally become symptomatic when sitting/standing, complaining of dizziness. Her hypotension is likely multifactorial and secondary to both volume depletion (decreased effective arterial volume (due to decreased PO intake and copious diarrhea)), and also to orthostasis (adrenal insufficiency and  advanced age). As the patient reports occasional falls at home which are caused by her dizziness, she was discharged on fludrocortisone 0.1mg /day, with the goal being a reduction in falls secondary to decreased cerebral perfusion on standing (patient reports having falls from dizziness at home prior to this admission).  Adrenal insufficiency - Patient has central adrenal insufficiency due to empty sella syndrome (possibly secondary to head trauma from approximately 1 year ago). She is followed as an outpatient by Dr. Sharl Ma who manages this issue. She had her hydrocortisone dose increased from 10mg  to 15mg  in 08/2011, which she was on at the time of presentation. During her hospital course the patient suffered from orthostatic hypotension, presumed to at least in part be secondary to this issue. She was prescribed oral hydrocortisone at 3x her home dose soon after admission. She was also started on NS @ 100cc/hr, which she had in place until 11/20/11. Hypotension slowly began to resolve over her hospital course, although her orthostasis was not completely resolved, she was deemed to be near her baseline symptomatically (no severe dizziness). She began to exhibit pretibial edema and rales on auscultation on 7/23, likely secondary to steroid-mediated edema and IVF. Her IV NS was discontinued at this point. At the time of discharge the patient did have pedal edema, but this is likely associated with venous insufficiency, and she had  only trace pretibial edema. She was thus sent home on fludrocortisone as described above.  AKI - Patient's creatine on admission was 1.35. Her baseline is ~1.0. This acute rise in Cr was likely secondary decreased PO intake of liquids/solids and also to diarrhea, causing her to be volume depleted. Her creatinine trended down over the remainder of her hospital course, and at the time of her discharge it was 1.2. The patient has been seen at [facility] this year due to peripheral edema  which was resolved with lasix, however it was not deemed to be secondary to renal disease, and rather was attributed to venous insufficiency.  Gluten intolerance - Biopsy in 2011 was negative for Celiac's disease, however patient and family endorse history of diarrhea worsened by the consumption of grains/breads, and believe that this is secondary to gluten intolerance. The patient received a gluten-free diet while hospitalized.   Insomnia - Patient has a history of insomnia effectively managed with trazodone 50mg  qhs. She had difficulty sleeping on multiple nights during her hospital course, and stated that this was due to trazodone being given after 9PM. Issue otherwise stable.  Disposition -  Due to the debility associated with the patient's chronic orthostatic hypotension and her abdominal distention secondary to bowel dilatation, she will be discharged to a SNF where she will receive PT/OT.  DISCHARGE DATA: Vital Signs: BP 94/52  Pulse 69  Temp 97.8 F (36.6 C) (Axillary)  Resp 16  Ht 5\' 7"  (1.702 m)  Wt 143 lb 1.3 oz (64.9 kg)  BMI 22.41 kg/m2  SpO2 98%  Labs: Results for orders placed during the hospital encounter of 11/16/11 (from the past 24 hour(s))  BASIC METABOLIC PANEL     Status: Abnormal   Collection Time   11/22/11  8:45 AM      Component Value Range   Sodium 141  135 - 145 mEq/L   Potassium 3.7  3.5 - 5.1 mEq/L   Chloride 110  96 - 112 mEq/L   CO2 25  19 - 32 mEq/L   Glucose, Bld 98  70 - 99 mg/dL   BUN 13  6 - 23 mg/dL   Creatinine, Ser 1.61 (*) 0.50 - 1.10 mg/dL   Calcium 8.5  8.4 - 09.6 mg/dL   GFR calc non Af Amer 41 (*) >90 mL/min   GFR calc Af Amer 47 (*) >90 mL/min   Time spent on discharge: >60 minutes to coordinate discharge plans and discuss options with family.  Signed: Elfredia Nevins, MD   PGY I, Internal Medicine Resident 11/22/2011, 1:00PM

## 2011-11-21 LAB — BASIC METABOLIC PANEL
CO2: 25 mEq/L (ref 19–32)
GFR calc Af Amer: 38 mL/min — ABNORMAL LOW (ref >90)
GFR calc non Af Amer: 33 mL/min — ABNORMAL LOW (ref >90)
Glucose, Bld: 82 mg/dL (ref 70–99)
Potassium: 4.4 mEq/L (ref 3.5–5.1)
Sodium: 145 mEq/L (ref 135–145)

## 2011-11-21 NOTE — Progress Notes (Signed)
Subjective: Patient feeling well today. Two loose bowel movements overnight. Patient continues to complain of dizziness on standing, but states that it is no worse than what she generally experiences at home at this point in time. She denies any nausea, vomiting, abdominal pain. Her appetite has been good and she has been able to eat all of her meals. She complains that she did have difficulty sleeping last night, but that it was improved over past nights, which she states is because she received her trazodone earlier in the evening.  No acute interval events. Denies fevers, chills, chest pain, SOB.  Objective: Vital signs in last 24 hours: Filed Vitals:   11/20/11 1417 11/20/11 2121 11/21/11 0232 11/21/11 0551  BP: 109/65 136/64  118/68  Pulse: 62 65  59  Temp: 98 F (36.7 C) 97.2 F (36.2 C)  98 F (36.7 C)  TempSrc:  Oral  Oral  Resp:  18  18  Height:      Weight:   143 lb 4.8 oz (65 kg)   SpO2: 98% 100%  99%   Physical exam:  General: Awake, alert; no acute distress  CV: regular rate and rhythm; no murmurs  Resp: Coarse breath sounds Abd: marked distention; no tenderness to palpation Ext: 2+ pulses in all four extremities; 1+ pretibial edema  Weight change: 1 lb 12.8 oz (0.816 kg)  Intake/Output Summary (Last 24 hours) at 11/21/11 1023 Last data filed at 11/20/11 1837  Gross per 24 hour  Intake    600 ml  Output      0 ml  Net    600 ml    Lab Results: Basic Metabolic Panel:  Lab 11/21/11 1610 11/20/11 0620 11/16/11 1800  NA 145 147* --  K 4.4 3.9 --  CL 114* 118* --  CO2 25 18* --  GLUCOSE 82 94 --  BUN 12 13 --  CREATININE 1.41* 1.13* --  CALCIUM 8.0* 7.6* --  MG -- -- 1.3*  PHOS -- -- --   Liver Function Tests:  Lab 11/16/11 1603  AST 18  ALT 11  ALKPHOS 124*  BILITOT 0.2*  PROT 7.1  ALBUMIN 2.7*    Lab 11/16/11 1800  LIPASE 20  AMYLASE --   CBC:  Lab 11/19/11 1829 11/16/11 1603  WBC 4.6 8.2  NEUTROABS 3.7 --  HGB 11.2* 13.2  HCT 33.8*  39.5  MCV 88.7 87.4  PLT 235 282   Urinalysis:  Lab 11/16/11 1853  COLORURINE YELLOW  LABSPEC 1.017  PHURINE 5.5  GLUCOSEU NEGATIVE  HGBUR NEGATIVE  BILIRUBINUR SMALL*  KETONESUR NEGATIVE  PROTEINUR 30*  UROBILINOGEN 0.2  NITRITE NEGATIVE  LEUKOCYTESUR TRACE*   Micro Results: Recent Results (from the past 240 hour(s))  OVA AND PARASITE EXAMINATION     Status: Normal   Collection Time   11/17/11  1:00 AM      Component Value Range Status Comment   Specimen Description STOOL   Final    Special Requests Immunocompromised   Final    Ova and parasites NO OVA OR PARASITES SEEN   Final    Report Status 11/19/2011 FINAL   Final   STOOL CULTURE     Status: Normal   Collection Time   11/17/11  1:00 AM      Component Value Range Status Comment   Specimen Description STOOL   Final    Special Requests Immunocompromised   Final    Culture     Final    Value: NO  SALMONELLA, SHIGELLA, CAMPYLOBACTER, YERSINIA, OR E.COLI 0157:H7 ISOLATED   Report Status 11/20/2011 FINAL   Final   GRAM STAIN     Status: Normal   Collection Time   11/17/11  1:00 AM      Component Value Range Status Comment   Specimen Description STOOL   Final    Special Requests Immunocompromised   Final    Gram Stain     Final    Value: NO WBC SEEN     NO SQUAMOUS EPITHELIAL CELLS SEEN     ABUNDANT GRAM NEGATIVE RODS     FEW GRAM POSITIVE COCCI     IN PAIRS   Report Status 11/18/2011 FINAL   Final   CLOSTRIDIUM DIFFICILE BY PCR     Status: Normal   Collection Time   11/17/11  1:00 AM      Component Value Range Status Comment   C difficile by pcr NEGATIVE  NEGATIVE Final    Studies/Results: Dg Abd 2 Views  13-Dec-2011  *RADIOLOGY REPORT*  Clinical Data: Tenderness left lower quadrant.  Hypotensive.  ABDOMEN - 2 VIEW  Comparison: 11/16/2011 CT.  Findings: Diffuse gas and fluid filled distended loops of small and large bowel.  Etiology indeterminate.  Sigmoid diverticuli noted. No free intraperitoneal air noted on the  decubitus view.  IMPRESSION: Diffuse gas and fluid filled distended loops of small and large bowel.  Etiology indeterminate.  Sigmoid diverticuli noted.  No free intraperitoneal air noted on the decubitus view.  This is a call report.  Original Report Authenticated By: Fuller Canada, M.D.   Medications: I have reviewed the patient's current medications. Scheduled Meds:   . ciprofloxacin  500 mg Oral BID  . heparin  5,000 Units Subcutaneous Q8H  . hydrocortisone  10 mg Oral Once  . hydrocortisone  45 mg Oral Q0600  . metroNIDAZOLE  500 mg Oral Q8H  . senna-docusate  1 tablet Oral BID  . traZODone  50 mg Oral Q24H   Continuous Infusions:  PRN Meds:.acetaminophen, acetaminophen, ondansetron (ZOFRAN) IV Assessment/Plan: Patient is an 78 y.o woman with PMH of colon cancer (s/p right hemicoloectomy w/o rad/chemo), pandiverticulosis, bowel obstruction, adrenal suppression (on chronic steroids) who presents with abdominal pain and distention x 2 weeks, as well as diarrhea and nausea.   Suspected diverticulitis - Patient has history of pandiverticulosis. Due to symptoms abdominal pain/diarrhea, and physical exam finding of LLQ abdominal tenderness which had been present on previous physical exam, the patient was assessed as likely to have developed some component of diverticulitis, although CT of the abdomen performed earlier in the hospital course did not reveal any discrete inflammation. As of today, patient no longer has tenderness to palpation of the abdomen. - continue cipro  - continue flagyl   Orthostatic hypotension - The patient states that this is a chronic issue which has caused her to fall at home in the past, but was worse during this hospital course. Patient is now improved to baseline. - begin fludrocortisone therapy  Adrenal insufficiency - On chronic steroid replacement. Patient was hypotensive on presentation, which is likely secondary decreased PO intake and diarrhea, but might  be worsened by adrenal insufficiency. Received 3 days of stress dose steroids - resume steroids at home dose  Gluten intolerance - Biopsy in 2011 negative for Celiac's disease, however patient complains of gastrointestinal discomfort/symptoms after eating gluten-containing products.  - continue gluten free diet   Insomnia -Slept better last night as received trazodone earlier. States she still did  not sleep great. If still an issue at home after discharge, consideration of increasing home trazodone dose likely a good idea for outpatient follow-up visit  DVT PPx - heparin   Diet - Gluten free diet   Dispo - Discharge likely today, as patient is having BM and is symptomatic on standing only to the degree to which she is at home (her baseline). She will be getting PT/OT rehab at an outside facility.   LOS: 5 days   Suraj Ramdass R 11/21/2011, 10:23 AM

## 2011-11-21 NOTE — Progress Notes (Signed)
Physical Therapy Treatment Patient Details Name: Rachel Bridges MRN: [id number] DOB: June 24, 1924 Today's Date: 11/21/2011 Time: 4098-1191 PT Time Calculation (min): 20 min  PT Assessment / Plan / Recommendation Comments on Treatment Session  Patient continues to be limited by orthostasis. Cannot progress with ambulation. Daughter in the room at end of session and states that she is concerned with her safety and does not think that they can manage at home if she were to fall. Patient lives with her daughter however her daughter is soon to have knee surgery and is using a wheelchair to get around the hospital. At this time will update DC plan to [facility] for continued rehab. Family and patient in agreement at this time and Case Manager Herbert Seta was notified    Follow Up Recommendations  Skilled nursing facility;Supervision/Assistance - 24 hour    Barriers to Discharge        Equipment Recommendations  Defer to next venue    Recommendations for Other Services    Frequency Min 3X/week   Plan Discharge plan needs to be updated;Frequency remains appropriate    Precautions / Restrictions Precautions Precautions: Fall Precaution Comments: Pt with symptomatic orthostasis. Restrictions Weight Bearing Restrictions: No   Pertinent Vitals/Pain     Mobility  Transfers Sit to Stand: 4: Min guard;With upper extremity assist;From chair/3-in-1 Stand to Sit: 4: Min guard;To chair/3-in-1;With armrests Details for Transfer Assistance: verbal cues for hand placement  Ambulation/Gait Ambulation/Gait Assistance: 4: Min assist Ambulation Distance (Feet): 20 Feet Assistive device: Rolling walker Ambulation/Gait Assistance Details: A required for RW management. Cues for posture. Patient with a "foggy" feeling with ambulation. +1 to follow with the chair due to orthostasis.  Gait Pattern: Step-to pattern;Decreased stride length;Right foot flat;Left foot flat;Trunk flexed;Wide base of support Stairs:  No Wheelchair Mobility Wheelchair Mobility: No    Exercises     PT Diagnosis:    PT Problem List:   PT Treatment Interventions:     PT Goals Acute Rehab PT Goals PT Goal: Sit to Stand - Progress: Progressing toward goal PT Goal: Ambulate - Progress: Progressing toward goal  Visit Information  Last PT Received On: 11/21/11 Assistance Needed: +2    Subjective Data      Cognition  Overall Cognitive Status: Impaired Area of Impairment: Safety/judgement;Awareness of deficits Arousal/Alertness: Awake/alert Orientation Level: Oriented X4 / Intact Behavior During Session: [facility] for tasks performed Safety/Judgement: Decreased safety judgement for tasks assessed Awareness of Deficits: pt does not report her hypotensive symptoms unless repeatedly asked    Balance  Balance Balance Assessed: No  End of Session PT - End of Session Equipment Utilized During Treatment: Gait belt Activity Tolerance: Other (comment) (orthostasis) Patient left: in chair;with call bell/phone within reach;with family/visitor present Nurse Communication: Mobility status;Other (comment)   GP     Annamaria Salah, Adline Potter 11/21/2011, 2:39 PM 11/21/2011 Fredrich Birks PTA [phone] pager [phone] office

## 2011-11-21 NOTE — Progress Notes (Addendum)
Clinical Social Work Department CLINICAL SOCIAL WORK PLACEMENT NOTE 11/21/2011  Patient:  Rachel Bridges, Rachel Bridges  Account Number:  [id number] Admit date:  11/16/2011  Clinical Social Worker:  Bonnye Fava, LCSW  Date/time:  11/21/2011 03:00 PM  Clinical Social Work is seeking post-discharge placement for this patient at the following level of care:   SKILLED NURSING   (*CSW will update this form in Epic as items are completed)   11/20/2011  Patient/family provided with [facility] Department of Clinical Social Work's list of facilities offering this level of care within the geographic area requested by the patient (or if unable, by the patient's family).  11/20/2011  Patient/family informed of their freedom to choose among providers that offer the needed level of care, that participate in Medicare, Medicaid or managed care program needed by the patient, have an available bed and are willing to accept the patient.  11/20/2011  Patient/family informed of [facility]' ownership interest in [facility], as well as of the fact that they are under no obligation to receive care at this facility.  PASARR submitted to EDS on 11/21/2011 PASARR number received from EDS on 11/22/11  FL2 transmitted to all facilities in geographic area requested by pt/family on  11/21/2011 FL2 transmitted to all facilities within larger geographic area on   Patient informed that his/her managed care company has contracts with or will negotiate with  certain facilities, including the following:     Patient/family informed of bed offers received:  11/22/11 Patient chooses bed at [facility] Physician recommends and patient chooses bed at    Patient to be transferred to [facility] on  11/22/11 Patient to be transferred to facility by [facility]  The following physician request were entered in Epic:  Additional Comments:  Jodean Lima, [phone]

## 2011-11-21 NOTE — Progress Notes (Signed)
Internal Medicine Teaching Service Attending Note Date: 11/21/2011  Patient name: AVON MOLOCK  Medical record number: [id number]  Date of birth: 04-Aug-1924    This patient has been seen and discussed with the house staff. Please see their note for complete details. I concur with their findings with the following additions/corrections: Ms Lovins is lying in bed and feels OK. Her BS are normoactive with occasional tinkling / high pitched which must be normal for her 2/2 her chronic colon distension. She has had two BM's that were not watery. The only remaining issue it to make certain that she is safe in regards to ambulation. If so, then she can be D/C'd home.   Madoline Bhatt 11/21/2011, 12:22 PM

## 2011-11-21 NOTE — Progress Notes (Signed)
Occupational Therapy Treatment Patient Details Name: Rachel Bridges MRN: [id number] DOB: 1925-01-16 Today's Date: 11/21/2011 Time: 3086-5784 OT Time Calculation (min): 20 min  OT Assessment / Plan / Recommendation Comments on Treatment Session Pt very willing to work with therapies, but limited by what she described as an "oozy feeling" and feeling like she was in a "fog."  Daughter arrived during session and is concerned that pt will fall at home and she will not be able to assist her.  Requesting SNF for rehab.  Pt also has edema in her face and arms which is not her baseline per daughter.  Communicated blood pressures and need for placement to RN and case Production designer, theatre/television/film.    Follow Up Recommendations  Skilled nursing facility    Barriers to Discharge       Equipment Recommendations  Defer to next venue    Recommendations for Other Services    Frequency     Plan Discharge plan needs to be updated    Precautions / Restrictions Precautions Precautions: Fall Precaution Comments: Pt with symptomatic orthostasis. Restrictions Weight Bearing Restrictions: No   Pertinent Vitals/Pain 102/58 sitting, 81/42 standing BP    ADL  Upper Body Dressing: Performed;Minimal assistance Where Assessed - Upper Body Dressing: Unsupported standing Toilet Transfer: Simulated;Minimal assistance Toilet Transfer Method: Sit to stand Equipment Used: Gait belt;Rolling walker Transfers/Ambulation Related to ADLs: min assist with chair following due to hypotension    OT Diagnosis:    OT Problem List:   OT Treatment Interventions:     OT Goals ADL Goals Pt Will Perform Upper Body Dressing: with set-up;with supervision;Sit to stand from bed;Sit to stand from chair;Unsupported ADL Goal: Upper Body Dressing - Progress: Progressing toward goals Pt Will Transfer to Toilet: with supervision;Ambulation;Comfort height toilet;3-in-1;with DME ADL Goal: Toilet Transfer - Progress: Not progressing  Visit  Information  Last OT Received On: 11/21/11 Assistance Needed: +2 (for safety due to [facility]) PT/OT Co-Evaluation/Treatment: Yes    Subjective Data      Prior Functioning       Cognition  Overall Cognitive Status: Impaired Area of Impairment: Safety/judgement;Awareness of deficits Arousal/Alertness: Awake/alert Orientation Level: Oriented X4 / Intact Behavior During Session: WFL for tasks performed Safety/Judgement: Decreased safety judgement for tasks assessed Awareness of Deficits: pt does not report her hypotensive symptoms unless repeatedly asked    Mobility Transfers Transfers: Sit to Stand;Stand to Sit Sit to Stand: 4: Min guard;With upper extremity assist;From chair/3-in-1 Stand to Sit: 4: Min guard;To chair/3-in-1;With armrests Details for Transfer Assistance: verbal cues for hand placement    Exercises    Balance    End of Session OT - End of Session Activity Tolerance: Treatment limited secondary to medical complications (Comment) (orthostasis, 102/58 sitting, 81/42 standing) Patient left: in chair;with family/visitor present;with call bell/phone within reach Nurse Communication: Other (comment) (blood pressures, symptoms, daughter desires SNF)  GO     Evern Bio 11/21/2011, 2:14 PM [phone]

## 2011-11-22 LAB — BASIC METABOLIC PANEL
BUN: 13 mg/dL (ref 6–23)
Chloride: 110 mEq/L (ref 96–112)
GFR calc Af Amer: 47 mL/min — ABNORMAL LOW (ref >90)
Glucose, Bld: 98 mg/dL (ref 70–99)
Potassium: 3.7 mEq/L (ref 3.5–5.1)

## 2011-11-22 MED ORDER — HYDROCORTISONE 10 MG PO TABS
10.0000 mg | ORAL_TABLET | ORAL | Status: DC
  Filled 2011-11-22: qty 1

## 2011-11-22 MED ORDER — HYDROCORTISONE 10 MG PO TABS: 10.0000 mg | ORAL_TABLET | ORAL | Status: DC

## 2011-11-22 MED ORDER — SENNOSIDES-DOCUSATE SODIUM 8.6-50 MG PO TABS: 1.0000 | ORAL_TABLET | Freq: Two times a day (BID) | ORAL | Status: DC

## 2011-11-22 MED ORDER — FLUDROCORTISONE ACETATE 0.1 MG PO TABS: 0.1000 mg | ORAL_TABLET | Freq: Every day | ORAL | Status: DC

## 2011-11-22 MED ORDER — METRONIDAZOLE 500 MG PO TABS: 500.0000 mg | ORAL_TABLET | Freq: Three times a day (TID) | ORAL | Status: AC

## 2011-11-22 MED ORDER — HYDROCORTISONE 5 MG PO TABS: 5.0000 mg | ORAL_TABLET | ORAL | Status: DC

## 2011-11-22 MED ORDER — HYDROCORTISONE 5 MG PO TABS
5.0000 mg | ORAL_TABLET | ORAL | Status: DC
  Filled 2011-11-22: qty 1

## 2011-11-22 MED ORDER — CIPROFLOXACIN HCL 500 MG PO TABS: 500.0000 mg | ORAL_TABLET | Freq: Two times a day (BID) | ORAL | Status: AC

## 2011-11-22 MED FILL — Trazodone HCl Tab 50 MG: ORAL | Qty: 1 | Status: AC

## 2011-11-22 MED FILL — Heparin Sodium (Porcine) Inj 5000 Unit/ML: INTRAMUSCULAR | Qty: 1 | Status: AC

## 2011-11-22 MED FILL — Hydrocortisone Tab 10 MG: ORAL | Qty: 1 | Status: AC

## 2011-11-22 NOTE — Progress Notes (Signed)
Clinical Social Work-CSW confirmed bed at [facility] has completed admissions paperwork-CSW arranging [facility] transportation with chart copy/avs and fl2- No further SW needs- Jodean Lima, [phone]

## 2011-11-22 NOTE — Progress Notes (Signed)
Subjective: Feels very well today. Only complaint is "feet feel tight". Denies nausea, vomiting, abdominal pain. States she still has dizziness on standing, but it is no worse than her baseline. Denies fevers, chills, chest pain, SOB.  No acute interval events.  Objective: Vital signs in last 24 hours: Filed Vitals:   11/21/11 2215 11/22/11 0555 11/22/11 0557 11/22/11 0558  BP: 98/56 134/62 118/52 94/52  Pulse:  69    Temp:  97.8 F (36.6 C)    TempSrc:  Axillary    Resp:  16    Height:      Weight:    143 lb 1.3 oz (64.9 kg)  SpO2:       Weight change: -3.5 oz (-0.1 kg)  Intake/Output Summary (Last 24 hours) at 11/22/11 0736 Last data filed at 11/22/11 0130  Gross per 24 hour  Intake    480 ml  Output    650 ml  Net   -170 ml   Physical exam:  General: Awake, alert; no acute distress  CV: regular rate and rhythm; no murmurs  Resp: Coarse breath sounds; faint rales bilaterally Abd: marked distention; no tenderness to palpation  Ext: 2+ pulses in all four extremities; 2+ pretibial edema  Lab Results: Basic Metabolic Panel:  Lab 11/21/11 1610 11/20/11 0620 11/16/11 1800  NA 145 147* --  K 4.4 3.9 --  CL 114* 118* --  CO2 25 18* --  GLUCOSE 82 94 --  BUN 12 13 --  CREATININE 1.41* 1.13* --  CALCIUM 8.0* 7.6* --  MG -- -- 1.3*  PHOS -- -- --   Liver Function Tests:  Lab 11/16/11 1603  AST 18  ALT 11  ALKPHOS 124*  BILITOT 0.2*  PROT 7.1  ALBUMIN 2.7*    Lab 11/16/11 1800  LIPASE 20  AMYLASE --   CBC:  Lab 11/19/11 1829 11/16/11 1603  WBC 4.6 8.2  NEUTROABS 3.7 --  HGB 11.2* 13.2  HCT 33.8* 39.5  MCV 88.7 87.4  PLT 235 282   Urinalysis:  Lab 11/16/11 1853  COLORURINE YELLOW  LABSPEC 1.017  PHURINE 5.5  GLUCOSEU NEGATIVE  HGBUR NEGATIVE  BILIRUBINUR SMALL*  KETONESUR NEGATIVE  PROTEINUR 30*  UROBILINOGEN 0.2  NITRITE NEGATIVE  LEUKOCYTESUR TRACE*   Micro Results: Recent Results (from the past 240 hour(s))  OVA AND PARASITE  EXAMINATION     Status: Normal   Collection Time   11/17/11  1:00 AM      Component Value Range Status Comment   Specimen Description STOOL   Final    Special Requests Immunocompromised   Final    Ova and parasites NO OVA OR PARASITES SEEN   Final    Report Status 11/19/2011 FINAL   Final   STOOL CULTURE     Status: Normal   Collection Time   11/17/11  1:00 AM      Component Value Range Status Comment   Specimen Description STOOL   Final    Special Requests Immunocompromised   Final    Culture     Final    Value: NO SALMONELLA, SHIGELLA, CAMPYLOBACTER, YERSINIA, OR E.COLI 0157:H7 ISOLATED   Report Status 11/20/2011 FINAL   Final   GRAM STAIN     Status: Normal   Collection Time   11/17/11  1:00 AM      Component Value Range Status Comment   Specimen Description STOOL   Final    Special Requests Immunocompromised   Final    Gram  Stain     Final    Value: NO WBC SEEN     NO SQUAMOUS EPITHELIAL CELLS SEEN     ABUNDANT GRAM NEGATIVE RODS     FEW GRAM POSITIVE COCCI     IN PAIRS   Report Status 11/18/2011 FINAL   Final   CLOSTRIDIUM DIFFICILE BY PCR     Status: Normal   Collection Time   11/17/11  1:00 AM      Component Value Range Status Comment   C difficile by pcr NEGATIVE  NEGATIVE Final    Studies/Results: No results found. Medications: I have reviewed the patient's current medications. Scheduled Meds:   . ciprofloxacin  500 mg Oral BID  . heparin  5,000 Units Subcutaneous Q8H  . hydrocortisone  10 mg Oral Once  . hydrocortisone  45 mg Oral Q0600  . metroNIDAZOLE  500 mg Oral Q8H  . senna-docusate  1 tablet Oral BID  . traZODone  50 mg Oral Q24H   Continuous Infusions:  PRN Meds:.acetaminophen, acetaminophen, ondansetron (ZOFRAN) IV Assessment/Plan: Patient is an 78 y.o woman with PMH of colon cancer (s/p right hemicoloectomy w/o rad/chemo), pandiverticulosis, bowel obstruction, adrenal suppression (on chronic steroids) who presents with abdominal pain and distention x  2 weeks, as well as diarrhea and nausea.   Suspected diverticulitis - Patient has history of pandiverticulosis. Due to symptoms abdominal pain/diarrhea, and physical exam finding of LLQ abdominal tenderness which had been present on previous physical exam, the patient was assessed as likely to have developed some component of diverticulitis, although CT of the abdomen performed earlier in the hospital course did not reveal any discrete inflammation. Continues to be free from pain or TTP of abdomen. - continue cipro  - continue flagyl   Orthostatic hypotension - The patient states that this is a chronic issue which has caused her to fall at home in the past, but was worse during this hospital course. Patient is now improved to baseline. Further deliberation of patient's volume status and benefit of adding fludrocortisone to medication regimen will be necessary, as patient is retaining excess fluid currently, as evidenced by rales and worsening pedal edema.   Adrenal insufficiency - On chronic steroid replacement. Patient was hypotensive on presentation, which is likely secondary decreased PO intake and diarrhea, but might be worsened by adrenal insufficiency. Received stress dose steroids secondary acute illness. - resume steroids at home dose   Gluten intolerance - Biopsy in 2011 negative for Celiac's disease, however patient complains of gastrointestinal discomfort/symptoms after eating gluten-containing products.  - continue gluten free diet   Insomnia -Sleeping better with trazodone earlier in the evening.  DVT PPx - heparin   Diet - Gluten free diet   Dispo - After weighing pros/cons of HH PT/OT versus SNF PT/OT, the family and patient have agreed that the patient will benefit most from SNF PT/OT at this time. We agree with this assessment, and are glad that the patient will be receiving the level of care that will most benefit her medically. She will likely be discharged home today, with  discharge pending successful SNF placement.   LOS: 6 days   Rachel Bridges 11/22/2011, 7:36 AM

## 2011-11-22 NOTE — Progress Notes (Signed)
Physical Therapy Treatment Patient Details Name: Rachel Bridges MRN: [id number] DOB: 03-08-1925 Today's Date: 11/22/2011 Time: 1000-1016 PT Time Calculation (min): 16 min  PT Assessment / Plan / Recommendation Comments on Treatment Session  Patient continues to state that she feels in a "fog". Patient agreeable to attempt ambulation but still limited. Patient planning to DC to SNF today    Follow Up Recommendations  Skilled nursing facility    Barriers to Discharge        Equipment Recommendations  Defer to next venue    Recommendations for Other Services    Frequency Min 3X/week   Plan Discharge plan remains appropriate;Frequency remains appropriate    Precautions / Restrictions Precautions Precautions: Fall Precaution Comments: Patient with symptomatic orthostatis   Pertinent Vitals/Pain     Mobility  Bed Mobility Left Sidelying to Sit: 4: Min assist Sitting - Scoot to Edge of Bed: 4: Min assist Details for Bed Mobility Assistance: A to shift weight into sitting position.  Transfers Sit to Stand: 4: Min guard;With upper extremity assist;From bed Stand to Sit: 4: Min guard;With upper extremity assist;To chair/3-in-1 Details for Transfer Assistance: Patient with exagerated forward flexion with stand. Cues for safety Ambulation/Gait Ambulation/Gait Assistance: 4: Min assist Ambulation Distance (Feet): 25 Feet Assistive device: Rolling walker Ambulation/Gait Assistance Details: A with RW and patient with one LOB with assist to correct Gait Pattern: Step-through pattern;Decreased stride length;Trunk flexed    Exercises     PT Diagnosis:    PT Problem List:   PT Treatment Interventions:     PT Goals Acute Rehab PT Goals PT Goal: Supine/Side to Sit - Progress: Progressing toward goal PT Goal: Sit to Stand - Progress: Progressing toward goal PT Goal: Ambulate - Progress: Progressing toward goal  Visit Information  Last PT Received On: 11/22/11 Assistance Needed:  +1    Subjective Data      Cognition  Overall Cognitive Status: Appears within functional limits for tasks assessed/performed Arousal/Alertness: Awake/alert Orientation Level: Appears intact for tasks assessed Behavior During Session: [facility] for tasks performed    Balance     End of Session PT - End of Session Equipment Utilized During Treatment: Gait belt Activity Tolerance: Patient tolerated treatment well;Other (comment) (limited by orthostasis) Patient left: in chair Nurse Communication: Mobility status   GP     Fredrich Birks 11/22/2011, 2:48 PM 11/22/2011 Fredrich Birks PTA [phone] pager [phone] office

## 2011-11-22 NOTE — Progress Notes (Signed)
Patient transferred to SNF transported by [facility]. Daughter with patient during transfer.

## 2011-11-22 NOTE — Progress Notes (Signed)
Internal Medicine Teaching Service Attending Note Date: 11/22/2011  Patient name: SPRUHA WEIGHT  Medical record number: [id number]  Date of birth: Aug 04, 1924    This patient has been seen and discussed with the house staff. Please see their note for complete details. I concur with their findings with the following additions/corrections: Ms Coronado's D/C was delayed after her daughters saw how weak she was standing. She is now going to SNF. Cont to have formed BM's. ABD sounds now nl. Mild LE edema to ankles. Start Florinef and inform Dr Sharl Ma. Dispo to [facility] today once bed available.   BUTCHER,ELIZABETH 11/22/2011, 12:43 PM

## 2011-12-02 ENCOUNTER — Telehealth: Payer: Self-pay | Admitting: Licensed Clinical Social Worker

## 2011-12-02 NOTE — Telephone Encounter (Signed)
CSW received call from Ms. Point's daughter, [name].  Daughter states pt was d/c to SNF from hospital and pt/family unsatisfied with current services at [facility].  Dau is bringing pt home to assist with care needs.  Pt/family requesting outpatient physical/occupational rehabilitation.  CSW informed family, will route to Dr. Clyde Lundborg per family request.

## 2011-12-04 ENCOUNTER — Encounter: Payer: Self-pay | Admitting: Internal Medicine

## 2011-12-04 ENCOUNTER — Ambulatory Visit (INDEPENDENT_AMBULATORY_CARE_PROVIDER_SITE_OTHER): Payer: Medicare Other | Admitting: Internal Medicine

## 2011-12-04 VITALS — BP 86/59 | HR 61 | Wt 132.7 lb

## 2011-12-04 DIAGNOSIS — R109 Unspecified abdominal pain: Secondary | ICD-10-CM

## 2011-12-04 DIAGNOSIS — J069 Acute upper respiratory infection, unspecified: Secondary | ICD-10-CM

## 2011-12-04 DIAGNOSIS — E2740 Unspecified adrenocortical insufficiency: Secondary | ICD-10-CM

## 2011-12-04 DIAGNOSIS — E2749 Other adrenocortical insufficiency: Secondary | ICD-10-CM

## 2011-12-04 DIAGNOSIS — R609 Edema, unspecified: Secondary | ICD-10-CM

## 2011-12-04 DIAGNOSIS — Z23 Encounter for immunization: Secondary | ICD-10-CM

## 2011-12-04 DIAGNOSIS — G4700 Insomnia, unspecified: Secondary | ICD-10-CM

## 2011-12-04 LAB — BASIC METABOLIC PANEL
Calcium: 8.6 mg/dL (ref 8.4–10.5)
Creat: 1.39 mg/dL — ABNORMAL HIGH (ref 0.50–1.10)
Sodium: 143 mEq/L (ref 135–145)

## 2011-12-04 MED ORDER — ZOSTER VACCINE LIVE 19400 UNT/0.65ML ~~LOC~~ SOLR
0.6500 mL | Freq: Once | SUBCUTANEOUS | Status: AC
Start: 2011-12-04 — End: 2011-12-05

## 2011-12-04 NOTE — Assessment & Plan Note (Signed)
Trazodone is not effective on its own, but works well when taken with Tylenol PM. She has been on amitriptyline in the past, but it is unclear why this was discontinued. The daughter thinks is because she was started on Zoloft instead. I will need to do some chart review. For now she should continue her current regimen. We consider the addition of Ambien 5 mg, but a side effect of this in the elderly is sleepwalking, and patient is a fall risk.

## 2011-12-04 NOTE — Assessment & Plan Note (Addendum)
Lungs are clear to auscultation. 2-D echo in the past revealed EF of 60-65% with grade 1 diastolic dysfunction (January 2841). Likely due to renal insufficiency. Patient has restarted Lasix. She was instructed to discontinue Lasix hospital discharge, but apparently she was receiving medication at nursing facility, and has been continued at home as well. Though she is hypotensive at present, she is asymptomatic, and her peripheral edema is extremely bothersome to her, as she does experience some numbness and tingling in addition. I've asked her to keep the close eye on her symptoms of dizziness and headache. Her daughter is a CMA, some comfortable with her continuing Lasix 20 mg daily.  -Bmet today (Daughter would like results mailed to her)

## 2011-12-04 NOTE — Assessment & Plan Note (Addendum)
She continues to be compliant with hydrocortisone and fludrocortisone. The blood pressure is low today, she is asymptomatic.  -Bmet today

## 2011-12-04 NOTE — Assessment & Plan Note (Signed)
Resolved at present. No pain in 24 hours. I asked that they continue to monitor this.

## 2011-12-04 NOTE — Patient Instructions (Addendum)
-  Please monitor your cough - if you have increased shortness of breath, weakness or fatigue, or develop a fever, please call the clinic.  -Please monitor your blood pressure.  If you start to feel dizzy, light-headed or develop a headache, please stop taking lasix and call the clinic.  If your home health aid/CMA is around, have them check your blood pressure and heart rate laying down and standing up, and call us.  -While your bottom appears red, I do not see any skin breakdown.  -I will call with results from your blood work.  -Try Benadryl 25mg  before bed to help with sleep.  Please be sure to bring all of your medications with you to every visit.  Should you have any new or worsening symptoms, please be sure to call the clinic at [phone].

## 2011-12-04 NOTE — Progress Notes (Signed)
Subjective:   Patient ID: Rachel Bridges female   DOB: 1924/07/16 78 y.o.   MRN: [id number]  HPI: Rachel Bridges is a pleasant 78 y.o. woman with history of colon cancer, anxiety, adrenal suppression, and diverticulosis who presents today for hospital followup. She is discharged from [facility] facility 2 days prior.  She was hospitalized from 11/16/2011 - 11/22/2011 with abdominal pain and was treated with ciprofloxacin and metronidazole for suspected diverticulitis (negative on CT abdomen, but high clinical suspicion given left lower corner and tenderness and pandiverticulosis).  She reports that the skilled nursing facility was off all, and she was treated very poorly. She and her daughter have a few complaints today.  1. insomnia: Patient takes trazodone 50 mg before bed, but this does not help with sleep onset. Even if she takes the medication at 6 PM, at midnight she will still be wide awake. Occasionally she takes Tylenol PM with the trazodone, and this helps her sleep better.  2. she had some fleeting abdominal pain 2 days prior, which has since resolved. Ever since her colon resection for colon cancer, she has always had loose bowel movements. No complaints at present.  3. her daughter noticed that when she brought her home, her mother's bottom was extremely red and worn down. She was like [place] to check for skin breakdown.  4. lower extremity edema: While she was in the nursing facility, patient had to sit up for long hours, which worsened her lower extremity edema. Edema has significantly improved since moving back home, especially with leg elevation and compression stockings. In the nursing facility she reports that it was so bad that her lips were turning black and blue. She complains of occasional numbness and tingling of lower extremities.  5. sputum producing cough: Patient reports a sputum producing cough for the last 7-10 days. She reports her roommate at the  nursing facility had a similar cough. No increased shortness of breath. No fever.  Patient denies chest pain and dizziness.    Past Medical History  Diagnosis Date  . MGUS (monoclonal gammopathy of unknown significance)      [facility]  . Anxiety   . History of colon cancer      History of bowel  obstruction. on treatment with Colace and MiraLax.  followed by Dr. Loreta Ave and Dr. Purnell Shoemaker  . Depression   . Diverticulosis of colon   . Osteopenia   . Lumbar spinal stenosis   . Atrophic vaginitis   . Renal cyst   . History of tobacco abuse     at least 64 pack year. Started about age 89 and continued until about age 37.  Marland Kitchen Dermatitis   . Hip pain      History  . Actinic keratosis   . Dementia   . Adrenal suppression     central, on chronic steroids, followed by Dr. Sharl Ma of endocrinology  . Gluten intolerance     August 2011 endoscopy with biopsy negative for celiac disease. Prior to this patient had elevated TTG IgG of 93 (normal <20). Dr Loreta Ave made clinical diagnosis of gluten intolerance based on diarrhea/symptoms from eating gluten. Needs GF diet.    Current Outpatient Prescriptions  Medication Sig Dispense Refill  . aspirin 81 MG tablet Take 81 mg by mouth daily.       . calcium citrate-vitamin D (CITRACAL+D) 315-200 MG-UNIT per tablet Take 1 tablet by mouth 2 (two) times daily.      Marland Kitchen  cyanocobalamin (,VITAMIN B-12,) 1000 MCG/ML injection Inject 1 mL (1,000 mcg total) into the muscle every 30 (thirty) days.  1 mL    . famotidine (PEPCID) 40 MG tablet Take 40 mg by mouth Daily.      . ferrous fumarate (HEMOCYTE - 106 MG FE) 325 (106 FE) MG TABS Take 1 tablet by mouth 3 (three) times daily.      . fludrocortisone (FLORINEF) 0.1 MG tablet Take 1 tablet (0.1 mg total) by mouth daily.  30 tablet  1  . furosemide (LASIX) 20 MG tablet Take 20 mg by mouth daily.      . hydrocortisone (CORTEF) 10 MG tablet Take 1 tablet (10 mg total) by mouth daily.   30 tablet  1  . hydrocortisone (CORTEF) 5 MG tablet Take 1 tablet (5 mg total) by mouth daily.  30 tablet  1  . ondansetron (ZOFRAN) 4 MG tablet Take 4 mg by mouth daily as needed. For nausea      . paricalcitol (ZEMPLAR) 1 MCG capsule Take 1 mcg by mouth every Monday, Wednesday, and Friday.      . potassium chloride (K-DUR) 10 MEQ tablet Take 2 tablets (20 mEq total) by mouth 2 (two) times daily.  60 tablet  1  . traZODone (DESYREL) 25 mg TABS Take 1 tablet (50 mg total) by mouth at bedtime.  30 tablet  3  . senna-docusate (SENOKOT-S) 8.6-50 MG per tablet Take 1 tablet by mouth 2 (two) times daily.  60 tablet  1  . zoster vaccine live, PF, (ZOSTAVAX) 47829 UNT/0.65ML injection Inject 19,400 Units into the skin once.  1 each  0    History   Social History  . Marital Status: Widowed    Spouse Name: N/A    Number of Children: N/A  . Years of Education: N/A   Social History Main Topics  . Smoking status: Former Smoker -- 1.0 packs/day for 24 years    Types: Cigarettes    Quit date: 08/01/1988  . Smokeless tobacco: None  . Alcohol Use: No  . Drug Use: No  . Sexually Active: No   Other Topics Concern  . None   Social History Narrative    Husband died of myeloma. Patient is to daughter's one of whom she lives with her.   Review of Systems: Constitutional: Denies fever, chills, diaphoresis, appetite change   HEENT: Denies photophobia, eye pain, redness, hearing loss, ear pain, congestion, sneezing, mouth sores, trouble swallowing, neck pain, neck stiffness and tinnitus.  + rhinorrhea Respiratory: Denies SOB, chest tightness,  and wheezing.   Cardiovascular: Denies chest pain, palpitations Gastrointestinal: Denies vomiting, constipation, blood in stool; mild abdominal distention.  Genitourinary: Denies dysuria, urgency, frequency, hematuria, flank pain and difficulty urinating.  Neurological: Denies dizziness, seizures, syncope, weakness, light-headedness, and headaches.    Psychiatric/Behavioral: Denies suicidal ideation, mood changes, confusion, nervousness, sleep disturbance and agitation  Objective:  Physical Exam: Filed Vitals:   12/04/11 1130  BP: 86/59  Pulse: 61  Weight: 132 lb 11.2 oz (60.192 kg)   Constitutional: Vital signs reviewed.  Patient is a well-developed and well-nourished woman  in no acute distress and cooperative with exam. Eyes: PERRL, EOMI, conjunctivae normal, No scleral icterus.  Cardiovascular: RRR, S1 normal, S2 normal, no MRG, pulses symmetric and intact bilaterally Pulmonary/Chest: CTAB, no wheezes, rales, or rhonchi Abdominal: Soft. Non-tender, non-distended, no masses, organomegaly, or guarding present.  high pitch bowel sounds (daughter reports is normal for patient) Neurological: A&O x3, Strength is normal and  symmetric bilaterally, cranial nerve II-XII are grossly intact, no focal motor deficit, sensory intact to light touch bilaterally.  Skin: Warm, dry and intact. No rash, cyanosis, or clubbing.  Psychiatric: Normal mood and affect. speech and behavior is normal. Judgment and thought content normal. Cognition and memory are normal.   Assessment & Plan:   Case and care discussed with Dr. Criselda Peaches. Patient to follow up in 1 month with their PCP at their request.  Please see problem oriented charting for further details.

## 2011-12-04 NOTE — Assessment & Plan Note (Addendum)
Given that her roommate was sick with a similar illness, this is likely a viral URI. She does not exhibit increased shortness of breath. Lungs are clear to auscultation. I asked her daughter and patient continue to monitor.

## 2011-12-05 ENCOUNTER — Encounter: Payer: Self-pay | Admitting: Internal Medicine

## 2011-12-05 NOTE — Telephone Encounter (Signed)
Dr. Clyde Lundborg requesting pt's PCP complete referral.  CSW will route to Dr. Eben Burow.

## 2011-12-09 ENCOUNTER — Other Ambulatory Visit: Payer: Self-pay | Admitting: Internal Medicine

## 2011-12-09 DIAGNOSIS — R269 Unspecified abnormalities of gait and mobility: Secondary | ICD-10-CM

## 2011-12-09 NOTE — Telephone Encounter (Signed)
It is OK for him to have outpatient PT/OT. I will put in an order in [facility] for PT/OT.

## 2011-12-10 ENCOUNTER — Encounter: Payer: Self-pay | Admitting: Internal Medicine

## 2011-12-10 ENCOUNTER — Telehealth: Payer: Self-pay | Admitting: *Deleted

## 2011-12-10 ENCOUNTER — Other Ambulatory Visit: Payer: Self-pay | Admitting: Internal Medicine

## 2011-12-10 DIAGNOSIS — R269 Unspecified abnormalities of gait and mobility: Secondary | ICD-10-CM

## 2011-12-10 NOTE — Telephone Encounter (Signed)
Rx for rolling walker faxed to [phone].Kingsley Spittle Cassady8/13/20131:53 PM

## 2011-12-10 NOTE — Telephone Encounter (Signed)
Call from [place], RN with [name] - # [phone] Ex [phone] Requesting a rolling walker, she has unsteady gait and at safety risk for falling at home.   She uses a friends standard walker which is to heavy.  Will need a written Rx for [employer] and Dx code Fax to [phone]

## 2011-12-10 NOTE — Telephone Encounter (Signed)
I gave the signed rx order to Purnell Shoemaker to fax.  Thank you

## 2011-12-18 ENCOUNTER — Encounter (HOSPITAL_COMMUNITY): Payer: Self-pay | Admitting: Family Medicine

## 2011-12-18 ENCOUNTER — Emergency Department (HOSPITAL_COMMUNITY): Payer: Medicare Other

## 2011-12-18 ENCOUNTER — Emergency Department (HOSPITAL_COMMUNITY)
Admission: EM | Admit: 2011-12-18 | Discharge: 2011-12-18 | Disposition: A | Discharge status: Home / Self Care | Payer: Medicare Other | Attending: Emergency Medicine | Admitting: Emergency Medicine

## 2011-12-18 ENCOUNTER — Telehealth: Payer: Self-pay | Admitting: *Deleted

## 2011-12-18 DIAGNOSIS — Z79899 Other long term (current) drug therapy: Secondary | ICD-10-CM | POA: Insufficient documentation

## 2011-12-18 DIAGNOSIS — Z85038 Personal history of other malignant neoplasm of large intestine: Secondary | ICD-10-CM | POA: Insufficient documentation

## 2011-12-18 DIAGNOSIS — R143 Flatulence: Secondary | ICD-10-CM | POA: Insufficient documentation

## 2011-12-18 DIAGNOSIS — K644 Residual hemorrhoidal skin tags: Secondary | ICD-10-CM | POA: Insufficient documentation

## 2011-12-18 DIAGNOSIS — K599 Functional intestinal disorder, unspecified: Secondary | ICD-10-CM | POA: Insufficient documentation

## 2011-12-18 DIAGNOSIS — R142 Eructation: Secondary | ICD-10-CM | POA: Insufficient documentation

## 2011-12-18 DIAGNOSIS — R141 Gas pain: Secondary | ICD-10-CM | POA: Insufficient documentation

## 2011-12-18 DIAGNOSIS — F0390 Unspecified dementia without behavioral disturbance: Secondary | ICD-10-CM | POA: Insufficient documentation

## 2011-12-18 DIAGNOSIS — R109 Unspecified abdominal pain: Secondary | ICD-10-CM | POA: Insufficient documentation

## 2011-12-18 LAB — CBC WITH DIFFERENTIAL/PLATELET
Basophils Absolute: 0 10*3/uL (ref 0.0–0.1)
HCT: 34.6 % — ABNORMAL LOW (ref 36.0–46.0)
Hemoglobin: 11.4 g/dL — ABNORMAL LOW (ref 12.0–15.0)
Lymphocytes Relative: 13 % (ref 12–46)
Monocytes Absolute: 0.4 10*3/uL (ref 0.1–1.0)
Monocytes Relative: 6 % (ref 3–12)
Neutro Abs: 5.1 10*3/uL (ref 1.7–7.7)
Neutrophils Relative %: 81 % — ABNORMAL HIGH (ref 43–77)
RDW: 13.6 % (ref 11.5–15.5)
WBC: 6.3 10*3/uL (ref 4.0–10.5)

## 2011-12-18 LAB — COMPREHENSIVE METABOLIC PANEL
AST: 13 U/L (ref 0–37)
Albumin: 2.8 g/dL — ABNORMAL LOW (ref 3.5–5.2)
Alkaline Phosphatase: 68 U/L (ref 39–117)
CO2: 19 mEq/L (ref 19–32)
Chloride: 108 mEq/L (ref 96–112)
Creatinine, Ser: 1.34 mg/dL — ABNORMAL HIGH (ref 0.50–1.10)
GFR calc non Af Amer: 35 mL/min — ABNORMAL LOW (ref >90)
Potassium: 3.7 mEq/L (ref 3.5–5.1)
Total Bilirubin: 0.3 mg/dL (ref 0.3–1.2)

## 2011-12-18 LAB — LACTIC ACID, PLASMA: Lactic Acid, Venous: 0.6 mmol/L (ref 0.5–2.2)

## 2011-12-18 LAB — URINALYSIS, ROUTINE W REFLEX MICROSCOPIC
Glucose, UA: NEGATIVE mg/dL
Nitrite: NEGATIVE
Urobilinogen, UA: 1 mg/dL (ref 0.0–1.0)
pH: 5 (ref 5.0–8.0)

## 2011-12-18 LAB — URINE MICROSCOPIC-ADD ON

## 2011-12-18 LAB — POCT I-STAT TROPONIN I: Troponin i, poc: 0 ng/mL (ref 0.00–0.08)

## 2011-12-18 MED ORDER — IOHEXOL 300 MG/ML  SOLN: 20.0000 mL | INTRAMUSCULAR | Status: AC

## 2011-12-18 MED ORDER — METOCLOPRAMIDE HCL 10 MG PO TABS: 10.0000 mg | ORAL_TABLET | Freq: Four times a day (QID) | ORAL | Status: DC

## 2011-12-18 MED ORDER — IOHEXOL 300 MG/ML  SOLN
80.0000 mL | Freq: Once | INTRAMUSCULAR | Status: AC | PRN
  Administered 2011-12-18: 80 mL via INTRAVENOUS

## 2011-12-18 NOTE — ED Notes (Signed)
Daughters' contact info: [name] [phone]) and [name] [phone])

## 2011-12-18 NOTE — Telephone Encounter (Signed)
Call from [facility] with [facility] [phone]  RN was called to see pt this week. Pt having diarrhea since Saturday and nausea. T 99.6, P - 56, R - 16  BP 80 / 52 Not able to hear Bowel Sounds, pt is weak and not able to get off couch.  Not drinking or eating.  Hx: bowel resection, kidney disease. Pt # [id number]  Please advise

## 2011-12-18 NOTE — Telephone Encounter (Signed)
Rachel Bridges talked to me about Rachel Bridges. The patient should definitely some to ER.

## 2011-12-18 NOTE — ED Notes (Signed)
Pt is drinking contrast. 

## 2011-12-18 NOTE — ED Notes (Signed)
Pt from home. Diarrhea for 3 to 4 days. Pt very lethargic. Pt having lower abdominal pain, nausea. General weakness. BP 80/50. IV with NS. 122/77 after fluids . CBG 89 HR 62. SPO2 94 RA. 2L 98%. Abdomen distended more than usual.

## 2011-12-18 NOTE — ED Notes (Signed)
Prescription given with discharge instructions.  

## 2011-12-18 NOTE — ED Provider Notes (Signed)
History     CSN: [id number]  Arrival date & time 12/18/11  1418   First MD Initiated Contact with Patient 12/18/11 1442      Chief Complaint  Patient presents with  . Abdominal Pain  . Hypotension    (Consider location/radiation/quality/duration/timing/severity/associated sxs/prior treatment) The history is provided by the patient, a relative and a caregiver.    78 y.o. demented female INAD accompanied by both daughters who state that she has been nauseous, with reduced PO intake, and post prandial abdominal pain x2 days. Pt has has been passing stool.  Associated with weakness and sleeping more than normal. She also had a fall at 5:30AM yesterday, no head trauma or LOC.  Pt recently changed to trazodone.    Past Medical History  Diagnosis Date  . MGUS (monoclonal gammopathy of unknown significance)      [facility]  . Anxiety   . History of colon cancer      History of bowel  obstruction. on treatment with Colace and MiraLax.  followed by Dr. Loreta Ave and Dr. Purnell Shoemaker  . Depression   . Diverticulosis of colon   . Osteopenia   . Lumbar spinal stenosis   . Atrophic vaginitis   . Renal cyst   . History of tobacco abuse     at least 64 pack year. Started about age 32 and continued until about age 12.  Marland Kitchen Dermatitis   . Hip pain      History  . Actinic keratosis   . Dementia   . Adrenal suppression     central, on chronic steroids, followed by Dr. Sharl Ma of endocrinology  . Gluten intolerance     August 2011 endoscopy with biopsy negative for celiac disease. Prior to this patient had elevated TTG IgG of 93 (normal <20). Dr Loreta Ave made clinical diagnosis of gluten intolerance based on diarrhea/symptoms from eating gluten. Needs GF diet.     Past Surgical History  Procedure Date  . Hemicolectomy      Right-sided  .  resection of ovarian cyst   . Cataract extraction      Left eye  . Knee arthroscopy      Left knee  . Appendectomy   .  Carpal tunnel release   . Lumbar laminectomy   . Lumbar fusion   . Replacement total knee     left knee    No family history on file.  History  Substance Use Topics  . Smoking status: Former Smoker -- 1.0 packs/day for 24 years    Types: Cigarettes    Quit date: 08/01/1988  . Smokeless tobacco: Not on file  . Alcohol Use: No    OB History    Grav Para Term Preterm Abortions TAB SAB Ect Mult Living                  Review of Systems  Unable to perform ROS: Dementia    Allergies  Morphine and related; Oxycodone hcl; and Pantoprazole  Home Medications   Current Outpatient Rx  Name Route Sig Dispense Refill  . ASPIRIN 81 MG PO TABS Oral Take 81 mg by mouth daily.     Marland Kitchen CALCIUM CITRATE-VITAMIN D 315-200 MG-UNIT PO TABS Oral Take 1 tablet by mouth 2 (two) times daily.    . CYANOCOBALAMIN 1000 MCG/ML IJ SOLN Intramuscular Inject 1 mL (1,000 mcg total) into the muscle every 30 (thirty) days. 1 mL   . FAMOTIDINE 40 MG PO  TABS Oral Take 40 mg by mouth Daily.    Marland Kitchen FERROUS FUMARATE 325 (106 FE) MG PO TABS Oral Take 1 tablet by mouth 3 (three) times daily.    Marland Kitchen FLUDROCORTISONE ACETATE 0.1 MG PO TABS Oral Take 1 tablet (0.1 mg total) by mouth daily. 30 tablet 1  . FUROSEMIDE 20 MG PO TABS Oral Take 20 mg by mouth daily.    Marland Kitchen ONDANSETRON HCL 4 MG PO TABS Oral Take 4 mg by mouth daily as needed. For nausea    . PARICALCITOL 1 MCG PO CAPS Oral Take 1 mcg by mouth every Monday, Wednesday, and Friday.    Marland Kitchen POTASSIUM CHLORIDE ER 10 MEQ PO TBCR Oral Take 2 tablets (20 mEq total) by mouth 2 (two) times daily. 60 tablet 1  . SENNOSIDES-DOCUSATE SODIUM 8.6-50 MG PO TABS Oral Take 1 tablet by mouth 2 (two) times daily. 60 tablet 1  . TRAZODONE 25 MG HALF TABLET Oral Take 1 tablet (50 mg total) by mouth at bedtime. 30 tablet 3    BP 115/63  Pulse 61  Temp 98.3 F (36.8 C)  Resp 18  SpO2 96%  Physical Exam  Nursing note and vitals reviewed. Constitutional: She appears well-developed  and well-nourished. No distress.  HENT:  Head: Normocephalic and atraumatic.  Eyes: Conjunctivae and EOM are normal. Pupils are equal, round, and reactive to light.  Neck: Normal range of motion. No tracheal deviation present.  Cardiovascular: Normal rate, regular rhythm and intact distal pulses.   Pulmonary/Chest: Effort normal and breath sounds normal. No respiratory distress. She has no wheezes. She has no rales. She exhibits no tenderness.  Abdominal: Soft. Bowel sounds are normal. She exhibits distension. She exhibits no mass. There is no tenderness. There is no rebound and no guarding.       Moderate distention, soft, no peritoneal sign. No indication of pain with deep palpation.   Genitourinary:       External hemorrhoids. Stool is not dark.   Musculoskeletal: Normal range of motion. She exhibits no edema.  Neurological: She is alert. She exhibits normal muscle tone.       Demented, follows commands  Skin: Skin is warm and dry.  Psychiatric: She has a normal mood and affect.    ED Course  Procedures (including critical care time)  Labs Reviewed  CBC WITH DIFFERENTIAL - Abnormal; Notable for the following:    RBC 3.79 (*)     Hemoglobin 11.4 (*)     HCT 34.6 (*)     Platelets 125 (*)     Neutrophils Relative 81 (*)     All other components within normal limits  COMPREHENSIVE METABOLIC PANEL - Abnormal; Notable for the following:    Glucose, Bld 116 (*)     Creatinine, Ser 1.34 (*)     Albumin 2.8 (*)     GFR calc non Af Amer 35 (*)     GFR calc Af Amer 41 (*)     All other components within normal limits  LIPASE, BLOOD  LACTIC ACID, PLASMA  POCT I-STAT TROPONIN I  URINALYSIS, ROUTINE W REFLEX MICROSCOPIC  OCCULT BLOOD GASTRIC / DUODENUM   Dg Abd Acute W/chest  12/18/2011  *RADIOLOGY REPORT*  Clinical Data: Abdominal pain with nausea.  ACUTE ABDOMEN SERIES (ABDOMEN 2 VIEW & CHEST 1 VIEW)  Comparison: 11/19/2011 and chest radiograph 08/14/2011.  Findings: Frontal view  of the chest shows midline trachea and stable heart size.  Lungs are clear.  No pleural  fluid.  Two views of the abdomen show gaseous distention of small bowel and colon, with associated air fluid levels seen predominantly in the colon.  Gas is seen in the rectum.  IMPRESSION: Diffuse gaseous distention of small bowel and colon with air-fluid levels in the colon, very similar to 11/19/2011.   Original Report Authenticated By: Reyes Ivan, M.D.     Date: 12/18/2011  Rate: 60  Rhythm: normal sinus rhythm  QRS Axis: left  Intervals: PR prolonged  ST/T Wave abnormalities: nonspecific ST/T changes  Conduction Disutrbances:first-degree A-V block  and right bundle branch block  Narrative Interpretation:   Old EKG Reviewed: unchanged    1. Colonic dysmotility       MDM  dDx Biliary colic/cholecystitis Gastric Ulcer SBO Mesenteric ischemia   78 y.o. female with nausea, reduced PO intake, and post prandial pain x2 days. Physical exam show distended abdomen that is soft with no peritoneal signs. VSS  Urine is clean, Bloodwork unremarkable. Acute abdominal series shows no infiltrate; distention of small and large intestine, findings unchanged from 11/19/2011.  FOBT and CT abd/pelvis pending. Verbal report given to Dr. Weldon Inches.           Wynetta Emery, PA-C 12/20/11 0030

## 2011-12-18 NOTE — ED Notes (Signed)
Patient transported to CT 

## 2011-12-18 NOTE — ED Provider Notes (Addendum)
Medical screening examination/treatment/procedure(s) were conducted as a shared visit with non-physician practitioner(s) and myself.  I personally evaluated the patient during the encounter Has hx of abd surgery.  C/o postprandial abd pain and decreased appetite.   No resp sxs.  On exam distended tender abdomen.  Labs unremarkable.  Will check ct.  Pain controlled now.   Cheri Guppy, MD 12/18/11 1800  8:23 PM Discussed test results with pt and family.  They understand and agree with plan.   Cheri Guppy, MD 12/18/11 2024

## 2011-12-19 ENCOUNTER — Telehealth: Payer: Self-pay | Admitting: *Deleted

## 2011-12-19 NOTE — Telephone Encounter (Signed)
Call from [place], RN with [facility] - # [phone] She is asking for an order for hospital bed, Pt having more difficulties at home. I will need a written Rx. With Dx code. Pt sent to ED yesterday, now back home  Fax # [phone]

## 2011-12-20 NOTE — ED Provider Notes (Signed)
Medical screening examination/treatment/procedure(s) were conducted as a shared visit with non-physician practitioner(s) and myself.  I personally evaluated the patient during the encounter  Cheri Guppy, MD 12/20/11 [phone]

## 2011-12-22 ENCOUNTER — Telehealth: Payer: Self-pay | Admitting: Internal Medicine

## 2011-12-22 NOTE — Telephone Encounter (Signed)
The patient's daughter calls with concerns.  The patient was in her usual state of health 8 days ago, when she developed, nausea, vomiting, and diarrhea.  She notes no unusual or suspicious food intake, and no sick contacts.  She presented to the ED on 8/21, and labs, CT, and x-ray were unremarkable.  The patient was given a prescription for reglan q6hrs, which she has been taking.  Since that time, she notes no vomiting, but continued nausea with poor PO intake.  She also continues to note 3 watery bowel movements per day, with no blood in her bowel movements.  Her family notes that she seems more tired than usual.  She has also had some unsteadiness on her feet for the last 7 days, which does not seem to be getting better or worse.  The patient's daughter calls out of concern that symptoms are not improving.  It's difficult to evaluate this patient over the phone, but her symptoms are concerning for a viral gastroenteritis vs perhaps c diff (given contact with ED).  The patient's daughter expresses that she does not want to come back to the ED, and "have my mom sit around for hours".  I believe the patient sounds stable (no real change in symptoms over the last several days), and recommend that she present to clinic tomorrow for further evaluation.  I inform her that if symptoms worsen, though, she should not hesitate to come to the ED.  The patient's daughter expressed thanks and understanding.

## 2011-12-23 ENCOUNTER — Inpatient Hospital Stay (HOSPITAL_COMMUNITY): Payer: Medicare Other

## 2011-12-23 ENCOUNTER — Encounter: Payer: Self-pay | Admitting: Internal Medicine

## 2011-12-23 ENCOUNTER — Telehealth: Payer: Self-pay | Admitting: *Deleted

## 2011-12-23 ENCOUNTER — Inpatient Hospital Stay (HOSPITAL_COMMUNITY)
Admission: AD | Admit: 2011-12-23 | Discharge: 2011-12-28 | DRG: 391 | Disposition: A | Discharge status: Home / Self Care | Payer: Medicare Other | Source: Ambulatory Visit | Attending: Internal Medicine | Admitting: Internal Medicine

## 2011-12-23 ENCOUNTER — Ambulatory Visit (INDEPENDENT_AMBULATORY_CARE_PROVIDER_SITE_OTHER): Payer: Medicare Other | Admitting: Internal Medicine

## 2011-12-23 VITALS — BP 99/77 | HR 93 | Temp 97.0°F | Resp 20 | Ht 64.5 in | Wt 119.0 lb

## 2011-12-23 DIAGNOSIS — E43 Unspecified severe protein-calorie malnutrition: Secondary | ICD-10-CM | POA: Diagnosis present

## 2011-12-23 DIAGNOSIS — E872 Acidosis, unspecified: Secondary | ICD-10-CM | POA: Diagnosis present

## 2011-12-23 DIAGNOSIS — R110 Nausea: Secondary | ICD-10-CM

## 2011-12-23 DIAGNOSIS — M899 Disorder of bone, unspecified: Secondary | ICD-10-CM | POA: Diagnosis present

## 2011-12-23 DIAGNOSIS — R197 Diarrhea, unspecified: Secondary | ICD-10-CM

## 2011-12-23 DIAGNOSIS — F0280 Dementia in other diseases classified elsewhere without behavioral disturbance: Secondary | ICD-10-CM | POA: Diagnosis present

## 2011-12-23 DIAGNOSIS — E869 Volume depletion, unspecified: Secondary | ICD-10-CM

## 2011-12-23 DIAGNOSIS — E2749 Other adrenocortical insufficiency: Secondary | ICD-10-CM | POA: Diagnosis present

## 2011-12-23 DIAGNOSIS — K3189 Other diseases of stomach and duodenum: Secondary | ICD-10-CM | POA: Diagnosis present

## 2011-12-23 DIAGNOSIS — E41 Nutritional marasmus: Secondary | ICD-10-CM | POA: Diagnosis present

## 2011-12-23 DIAGNOSIS — K9041 Non-celiac gluten sensitivity: Secondary | ICD-10-CM | POA: Diagnosis present

## 2011-12-23 DIAGNOSIS — Z66 Do not resuscitate: Secondary | ICD-10-CM | POA: Diagnosis present

## 2011-12-23 DIAGNOSIS — G4700 Insomnia, unspecified: Secondary | ICD-10-CM | POA: Diagnosis present

## 2011-12-23 DIAGNOSIS — N179 Acute kidney failure, unspecified: Secondary | ICD-10-CM | POA: Diagnosis present

## 2011-12-23 DIAGNOSIS — Z85038 Personal history of other malignant neoplasm of large intestine: Secondary | ICD-10-CM

## 2011-12-23 DIAGNOSIS — K5289 Other specified noninfective gastroenteritis and colitis: Secondary | ICD-10-CM | POA: Diagnosis present

## 2011-12-23 DIAGNOSIS — K5190 Ulcerative colitis, unspecified, without complications: Secondary | ICD-10-CM | POA: Diagnosis present

## 2011-12-23 DIAGNOSIS — M949 Disorder of cartilage, unspecified: Secondary | ICD-10-CM | POA: Diagnosis present

## 2011-12-23 DIAGNOSIS — Z87891 Personal history of nicotine dependence: Secondary | ICD-10-CM

## 2011-12-23 DIAGNOSIS — Z7982 Long term (current) use of aspirin: Secondary | ICD-10-CM

## 2011-12-23 DIAGNOSIS — G309 Alzheimer's disease, unspecified: Secondary | ICD-10-CM | POA: Diagnosis present

## 2011-12-23 DIAGNOSIS — Z96659 Presence of unspecified artificial knee joint: Secondary | ICD-10-CM

## 2011-12-23 DIAGNOSIS — E860 Dehydration: Secondary | ICD-10-CM | POA: Diagnosis present

## 2011-12-23 DIAGNOSIS — E2740 Unspecified adrenocortical insufficiency: Secondary | ICD-10-CM

## 2011-12-23 DIAGNOSIS — F329 Major depressive disorder, single episode, unspecified: Secondary | ICD-10-CM | POA: Diagnosis present

## 2011-12-23 DIAGNOSIS — K900 Celiac disease: Principal | ICD-10-CM | POA: Diagnosis present

## 2011-12-23 DIAGNOSIS — N289 Disorder of kidney and ureter, unspecified: Secondary | ICD-10-CM

## 2011-12-23 DIAGNOSIS — N189 Chronic kidney disease, unspecified: Secondary | ICD-10-CM | POA: Diagnosis present

## 2011-12-23 LAB — COMPREHENSIVE METABOLIC PANEL
ALT: 9 U/L (ref 0–35)
AST: 15 U/L (ref 0–37)
Albumin: 3.1 g/dL — ABNORMAL LOW (ref 3.5–5.2)
Alkaline Phosphatase: 104 U/L (ref 39–117)
BUN: 35 mg/dL — ABNORMAL HIGH (ref 6–23)
Calcium: 9.3 mg/dL (ref 8.4–10.5)
Chloride: 109 mEq/L (ref 96–112)
Potassium: 4.1 mEq/L (ref 3.5–5.3)
Sodium: 141 mEq/L (ref 135–145)
Total Protein: 7.5 g/dL (ref 6.0–8.3)

## 2011-12-23 LAB — URINALYSIS, ROUTINE W REFLEX MICROSCOPIC
Glucose, UA: NEGATIVE mg/dL
Hgb urine dipstick: NEGATIVE
Ketones, ur: 15 mg/dL — AB
Protein, ur: NEGATIVE mg/dL

## 2011-12-23 LAB — CBC WITH DIFFERENTIAL/PLATELET
Basophils Absolute: 0 10*3/uL (ref 0.0–0.1)
Basophils Relative: 0 % (ref 0–1)
HCT: 42 % (ref 36.0–46.0)
Hemoglobin: 14 g/dL (ref 12.0–15.0)
Lymphocytes Relative: 20 % (ref 12–46)
MCHC: 33.3 g/dL (ref 30.0–36.0)
Monocytes Absolute: 1 10*3/uL (ref 0.1–1.0)
Neutro Abs: 8.1 10*3/uL — ABNORMAL HIGH (ref 1.7–7.7)
Neutrophils Relative %: 70 % (ref 43–77)
RDW: 13.7 % (ref 11.5–15.5)
WBC: 11.6 10*3/uL — ABNORMAL HIGH (ref 4.0–10.5)

## 2011-12-23 MED ORDER — ONDANSETRON HCL 4 MG/2ML IJ SOLN: 4.0000 mg | Freq: Four times a day (QID) | INTRAMUSCULAR | Status: DC | PRN

## 2011-12-23 MED ORDER — TRAZODONE HCL 50 MG PO TABS
50.0000 mg | ORAL_TABLET | Freq: Every day | ORAL | Status: DC
  Administered 2011-12-23: 50 mg via ORAL
  Filled 2011-12-23 (×3): qty 1

## 2011-12-23 MED ORDER — PARICALCITOL 1 MCG PO CAPS
1.0000 ug | ORAL_CAPSULE | ORAL | Status: DC
  Administered 2011-12-25 – 2011-12-27 (×2): 1 ug via ORAL
  Filled 2011-12-23 (×4): qty 1

## 2011-12-23 MED ORDER — ACETAMINOPHEN 650 MG RE SUPP: 650.0000 mg | Freq: Four times a day (QID) | RECTAL | Status: DC | PRN

## 2011-12-23 MED ORDER — ASPIRIN 81 MG PO TABS: 81.0000 mg | ORAL_TABLET | Freq: Every day | ORAL | Status: DC

## 2011-12-23 MED ORDER — HYDROCORTISONE SOD SUCCINATE 100 MG IJ SOLR
50.0000 mg | Freq: Four times a day (QID) | INTRAMUSCULAR | Status: DC
  Administered 2011-12-23 – 2011-12-26 (×12): 50 mg via INTRAVENOUS
  Filled 2011-12-23 (×16): qty 1

## 2011-12-23 MED ORDER — SODIUM CHLORIDE 0.9 % IV SOLN
INTRAVENOUS | Status: DC
  Administered 2011-12-23 (×2): 100 mL/h via INTRAVENOUS
  Administered 2011-12-25: 02:00:00 via INTRAVENOUS
  Administered 2011-12-26: 1000 mL via INTRAVENOUS

## 2011-12-23 MED ORDER — HEPARIN SODIUM (PORCINE) 5000 UNIT/ML IJ SOLN
5000.0000 [IU] | Freq: Three times a day (TID) | INTRAMUSCULAR | Status: DC
  Administered 2011-12-23 – 2011-12-27 (×14): 5000 [IU] via SUBCUTANEOUS
  Filled 2011-12-23 (×18): qty 1

## 2011-12-23 MED ORDER — METOCLOPRAMIDE HCL 5 MG/ML IJ SOLN
5.0000 mg | Freq: Two times a day (BID) | INTRAMUSCULAR | Status: DC
  Administered 2011-12-23 – 2011-12-26 (×6): 5 mg via INTRAVENOUS
  Filled 2011-12-23 (×8): qty 1

## 2011-12-23 MED ORDER — ASPIRIN EC 81 MG PO TBEC
81.0000 mg | DELAYED_RELEASE_TABLET | Freq: Every day | ORAL | Status: DC
  Administered 2011-12-23 – 2011-12-28 (×6): 81 mg via ORAL
  Filled 2011-12-23 (×7): qty 1

## 2011-12-23 MED ORDER — ONDANSETRON HCL 4 MG PO TABS: 4.0000 mg | ORAL_TABLET | Freq: Four times a day (QID) | ORAL | Status: DC | PRN

## 2011-12-23 MED ORDER — ACETAMINOPHEN 325 MG PO TABS: 650.0000 mg | ORAL_TABLET | Freq: Four times a day (QID) | ORAL | Status: DC | PRN

## 2011-12-23 MED ORDER — IOHEXOL 300 MG/ML  SOLN
20.0000 mL | INTRAMUSCULAR | Status: AC
  Administered 2011-12-23 (×2): 20 mL via ORAL

## 2011-12-23 NOTE — Progress Notes (Signed)
Subjective:   Patient ID: Rachel Bridges female   DOB: Apr 25, 1925 78 y.o.   MRN: [id number]  HPI: Rachel Bridges is a 77 y.o. woman with history of colon cancer (status post right hemicolectomy), diverticulosis, including intolerance who presents today with complaints of nausea vomiting and diarrhea. Her daughter called the on-call resident last night with these concerns.  Apparently per Dr. Theora Gianotti note, the patient was in her usual state of health until 8 days prior. She developed nausea, vomiting, and diarrhea. No sick contacts. No new food. She went to the emergency room on August 21, and labs CT scan and x-ray were unremarkable. She was given a prescription for Reglan every 6 hours. Vomiting has improved, but she has persistent nausea with decreased by mouth intake. She has persistent watery bowel movements per day without blood. She seems fatigued. She has been unsteady for about 7 days.  Of note, patient was discharged from the hospital in July with similar symptoms. At that time she was treated with Cipro and Flagyl for presumed diverticulitis.  12 lb weight loss since last clinic visit.  Today, her daughter tells me that patient has been experiencing dizziness with standing, increased weakness to the point where she needs help to stand, increased somnolence, and confusion (" talking out of her head" - patient reports that her children are going to take her to [facility], or she will talk to her children about her children). Fever of 101 on Saturday. No abdominal pain.  Her daughter has increased the patient's hydrocortisone to 20 mg in the morning and 10 mg in the afternoon. But it has been difficult for patient to take medications, as she continues to feel nauseated. Last Tuesday morning, patient did have a fall, without head injury. Her daughter reports that the patient's bottom is raw. She has had persistent diarrhea, to the point where they have used an entire package of  dependent over the last 4-5 days. Today in clinic she almost experienced fecal incontinence. Her daughter notes that she has had several episodes of fecal incontinence at home.   Past Medical History  Diagnosis Date  . MGUS (monoclonal gammopathy of unknown significance)      [facility]  . Anxiety   . History of colon cancer      History of bowel  obstruction. on treatment with Colace and MiraLax.  followed by Dr. Loreta Ave and Dr. Purnell Shoemaker  . Depression   . Diverticulosis of colon   . Osteopenia   . Lumbar spinal stenosis   . Atrophic vaginitis   . Renal cyst   . History of tobacco abuse     at least 64 pack year. Started about age 65 and continued until about age 67.  Marland Kitchen Dermatitis   . Hip pain      History  . Actinic keratosis   . Dementia   . Adrenal suppression     central, on chronic steroids, followed by Dr. Sharl Ma of endocrinology  . Gluten intolerance     August 2011 endoscopy with biopsy negative for celiac disease. Prior to this patient had elevated TTG IgG of 93 (normal <20). Dr Loreta Ave made clinical diagnosis of gluten intolerance based on diarrhea/symptoms from eating gluten. Needs GF diet.    Current Outpatient Prescriptions  Medication Sig Dispense Refill  . aspirin 81 MG tablet Take 81 mg by mouth daily.       . calcium citrate-vitamin D (CITRACAL+D) 315-200 MG-UNIT per  tablet Take 1 tablet by mouth 2 (two) times daily.      . cyanocobalamin (,VITAMIN B-12,) 1000 MCG/ML injection Inject 1 mL (1,000 mcg total) into the muscle every 30 (thirty) days.  1 mL    . famotidine (PEPCID) 40 MG tablet Take 40 mg by mouth Daily.      . ferrous fumarate (HEMOCYTE - 106 MG FE) 325 (106 FE) MG TABS Take 1 tablet by mouth 3 (three) times daily.      . furosemide (LASIX) 20 MG tablet Take 20 mg by mouth daily.      . hydrocortisone (CORTEF) 10 MG tablet Take 5-20 mg by mouth 2 (two) times daily. 20 mg in a.m. And 5 mg at 5 p.m.      Marland Kitchen metoCLOPramide  (REGLAN) 10 MG tablet Take 1 tablet (10 mg total) by mouth every 6 (six) hours.  30 tablet  0  . ondansetron (ZOFRAN) 4 MG tablet Take 4 mg by mouth daily as needed. For nausea      . paricalcitol (ZEMPLAR) 1 MCG capsule Take 1 mcg by mouth every Monday, Wednesday, and Friday.      . potassium chloride (K-DUR) 10 MEQ tablet Take 2 tablets (20 mEq total) by mouth 2 (two) times daily.  60 tablet  1  . senna-docusate (SENOKOT-S) 8.6-50 MG per tablet Take 1 tablet by mouth 2 (two) times daily.  60 tablet  1  . traZODone (DESYREL) 50 MG tablet Take 50 mg by mouth at bedtime.       No family history on file.  History   Social History  . Marital Status: Widowed    Spouse Name: N/A    Number of Children: N/A  . Years of Education: N/A   Social History Main Topics  . Smoking status: Former Smoker -- 1.0 packs/day for 24 years    Types: Cigarettes    Quit date: 08/01/1988  . Smokeless tobacco: Not on file  . Alcohol Use: No  . Drug Use: No  . Sexually Active: No   Other Topics Concern  . Not on file   Social History Narrative    Husband died of myeloma. Patient is to daughter's one of whom she lives with her.   Review of Systems: General: Decreased appetite Skin: no rash HEENT: no blurry vision, hearing changes, sore throat Pulm: no dyspnea, coughing, wheezing CV: no chest pain, palpitations, shortness of breath Abd: As per history of present illness GU: no dysuria, hematuria, polyuria Ext: no arthralgias, myalgias Neuro: no weakness, numbness, or tingling   Objective:  Physical Exam: Filed Vitals:   12/23/11 1118  BP: 99/77  Pulse: 93  Temp: 97 F (36.1 C)  TempSrc: Oral  Resp: 20  Height: 5' 4.5" (1.638 m)  Weight: 119 lb (53.978 kg)  SpO2: 97%  -unable to acquire orthostatic vital signs, patient has difficulty standing  Constitutional: Vital signs reviewed.  Patient is a weak, frail appearing woman in no acute distress and cooperative with exam.  Mouth: no  erythema or exudates, dry mucous membranes Eyes: PERRL, EOMI, conjunctivae normal, No scleral icterus.  sunken eyes bilaterally Cardiovascular: RRR, S1 normal, S2 normal, no MRG, pulses symmetric and intact bilaterally Pulmonary/Chest: CTAB, no wheezes, rales, or rhonchi Abdominal: Soft. Non-tender, mildly distended, especially at the right lower quadrant, bowel sounds are hypoactive (usually, patient has hyperactive, tinkling bowel sounds), no masses, organomegaly, or guarding present.  Neurological: A&O x3, generalized weakness Skin: Warm, dry and intact. No rash, cyanosis, or  clubbing.  Psychiatric: Appears somnolent  Assessment & Plan:   Case and care discussed with Dr. Josem Kaufmann area the patient to be admitted with diarrhea and dehydration.

## 2011-12-23 NOTE — Assessment & Plan Note (Addendum)
Patient has increased hydrocortisone as instructed for sick days. She has had decreased appetite with persistent diarrhea. She has been off of all stool softeners. She was treated with Reglan in the ED because of resumed hypoactive bowel based on CT abdomen.  C. difficile is in the differential given her history of Cipro; she is on a PPI, but is taking Zantac. She is immunosuppressed. Symptoms may also be due to enterocolitis. No sick contacts. CT scan on August 21 was negative for any mass effect causing obstruction Patient has history of diverticulitis, but does not complain of abdominal pain on exam.  -Admission to regular bed -Start IV fluids normal saline (would like to bolus, but because of concerns of blowing IV, we will start with 125 cc per hour and increase as tolerated until we can get another IV) -Stat CBC, CMET, C. difficile PCR -Stool studies including ova parasites, potassium, sodium (to calculate osm gap) and culture -Discussed with bridge resident, Dr. Anselm Jungling -patient to be admitted to teaching service for management of diarrhea and dehydration  ADDENDUM: will postpone stool studies until patient on floor - patient very uncomfortable in clinic.

## 2011-12-23 NOTE — Addendum Note (Signed)
Addended by: Belia Heman on: 12/23/2011 12:21 PM   Modules accepted: Orders

## 2011-12-23 NOTE — H&P (Signed)
Hospital Admission Note Date: 12/23/2011  Patient name: Rachel Bridges Medical record number: [id number] Date of birth: [birth year] Age: 78 y.o. Gender: female PCP: Lars Mage, MD  Medical Service:  Attending: Dr. Margarito Liner  Internal Medicine Teaching Service Contact Information  1st Contact: Dr. Leonia Reeves Pager: [phone] 2nd Contact: Dr. Bard Herbert Pager: [phone] After 5 pm or weekends: 1st Contact: Pager: [phone] 2nd Contact: Pager: [phone]   Chief Complaint: Diarrhea  History of Present Illness: Rachel Bridges is an 78 year-old woman with PMH significant for adrenal insufficiency (empty sella syndrome), monoclonal gammopathy, vitamin B12 deficiency, colon cancer (s/p hemicolectomy, no chemo or radiation, in 2000, Dr. Loreta Ave follows), who presented today at the [facility] for evalutation of N/V/D, fever with Tmax of 101 F on Saturday, and was found to be dehydrated with persistent diarrhea and poor PO intake. According to her daughter these symptoms started about 9 days ago. She was seen in the ED on 8/21 with labs and CT abdomen unremarkable for acute changes, including diverticulitis. She was discharged home with Reglan q6h and has had improvement of her vomiting but continued nausea and decreased intake by mouth. In addition, today, she has been experiencing dizziness with standing, increased weakness, and confusion. She had a fall at home on Tuesday, 8/20 but without head injury. She takes hydrocortisone 20mg  in the AM and 10mg  in the PM for adrenal insufficiency but she has struggled to keep this down secondary to nausea and vomiting.   Of note, she was discharged from the hospital on 11/22/11 for similar symptoms and was given Cipro and Flagyl for presumed diverticulitis.   She denies headache, abdominal pain, or blood in her stool.   Meds: Medications Prior to Admission  Medication Sig Dispense Refill  . aspirin 81 MG tablet Take 81 mg by mouth daily.       . calcium  citrate-vitamin D (CITRACAL+D) 315-200 MG-UNIT per tablet Take 1 tablet by mouth 2 (two) times daily.      . cyanocobalamin (,VITAMIN B-12,) 1000 MCG/ML injection Inject 1,000 mcg into the muscle every 30 (thirty) days.      . famotidine (PEPCID) 40 MG tablet Take 40 mg by mouth Daily.      . ferrous fumarate (HEMOCYTE - 106 MG FE) 325 (106 FE) MG TABS Take 1 tablet by mouth 3 (three) times daily.      . furosemide (LASIX) 20 MG tablet Take 20 mg by mouth daily.      . hydrocortisone (CORTEF) 10 MG tablet Take 5-20 mg by mouth 2 (two) times daily. 20 mg in a.m. And 5 mg at 5 p.m.      Marland Kitchen metoCLOPramide (REGLAN) 10 MG tablet Take 10 mg by mouth 4 (four) times daily.      . ondansetron (ZOFRAN) 4 MG tablet Take 4 mg by mouth daily as needed. For nausea      . paricalcitol (ZEMPLAR) 1 MCG capsule Take 1 mcg by mouth every Monday, Wednesday, and Friday.      . potassium chloride (K-DUR) 10 MEQ tablet Take 20 mEq by mouth 2 (two) times daily.      . sennosides-docusate sodium (SENOKOT-S) 8.6-50 MG tablet Take 1 tablet by mouth 2 (two) times daily.      . traZODone (DESYREL) 50 MG tablet Take 50 mg by mouth at bedtime.       Allergies: Allergies as of 12/23/2011 - Review Complete 12/23/2011  Allergen Reaction Noted  . Morphine and related Other (See Comments)  03/02/2011  . Oxycodone hcl (oxycodone hcl) Other (See Comments) 03/02/2011  . Pantoprazole (pantoprazole sodium) Nausea Only 03/02/2011   Past Medical History  Diagnosis Date  . MGUS (monoclonal gammopathy of unknown significance)      [facility]  . Anxiety   . History of colon cancer      History of bowel  obstruction. on treatment with Colace and MiraLax.  followed by Dr. Loreta Ave and Dr. Purnell Shoemaker  . Depression   . Diverticulosis of colon   . Osteopenia   . Lumbar spinal stenosis   . Atrophic vaginitis   . Renal cyst   . History of tobacco abuse     at least 64 pack year. Started about age 16 and  continued until about age 7.  Marland Kitchen Dermatitis   . Hip pain      History  . Actinic keratosis   . Dementia   . Adrenal suppression     central, on chronic steroids, followed by Dr. Sharl Ma of endocrinology  . Gluten intolerance     August 2011 endoscopy with biopsy negative for celiac disease. Prior to this patient had elevated TTG IgG of 93 (normal <20). Dr Loreta Ave made clinical diagnosis of gluten intolerance based on diarrhea/symptoms from eating gluten. Needs GF diet.    Past Surgical History  Procedure Date  . Hemicolectomy      Right-sided  .  resection of ovarian cyst   . Cataract extraction      Left eye  . Knee arthroscopy      Left knee  . Appendectomy   . Carpal tunnel release   . Lumbar laminectomy   . Lumbar fusion   . Replacement total knee     left knee   No family history on file. History   Social History  . Marital Status: Widowed    Spouse Name: N/A    Number of Children: N/A  . Years of Education: N/A   Occupational History  . Not on file.   Social History Main Topics  . Smoking status: Former Smoker -- 1.0 packs/day for 24 years    Types: Cigarettes    Quit date: 08/01/1988  . Smokeless tobacco: Not on file  . Alcohol Use: No  . Drug Use: No  . Sexually Active: No   Other Topics Concern  . Not on file   Social History Narrative    Husband died of myeloma. Patient is to daughter's one of whom she lives with her.    Review of Systems: Negative except as stated in HPI  Physical Exam: Blood pressure 99/68, pulse 78, temperature 98.3 F (36.8 C), temperature source Oral. Vitals reviewed. General: resting in bed, in NAD HEENT: PERRL, EOMI, no scleral icterus. Dry MM. Sunken eyes. Cardiac: RRR, no rubs, murmurs or gallops Pulm: clear to auscultation bilaterally, no wheezes, rales, or rhonchi Abd: soft, nontender, distended, hypoactive bowel sounds Skin: Sacral area with skin irritation but no ulcer.  Ext: warm and well perfused, no pedal edema.  DP 1+ bilaterally Neuro: alert and oriented X3, cranial nerves II-XII grossly intact, decreased bulk, normal tone.   Lab results: Basic Metabolic Panel:  Basename 12/23/11 1545 12/23/11 1220  NA -- 141  K -- 4.1  CL -- 109  CO2 -- 16*  GLUCOSE -- 132*  BUN -- 35*  CREATININE -- 1.71*  CALCIUM -- 9.3  MG 1.6 --  PHOS -- --   Liver Function Tests:  [facility] 12/23/11  1220  AST 15  ALT 9  ALKPHOS 104  BILITOT 0.2*  PROT 7.5  ALBUMIN 3.1*   CBC:  Basename 12/23/11 1220  WBC 11.6*  NEUTROABS 8.1*  HGB 14.0  HCT 42.0  MCV 89.2  PLT 303   Urinalysis:  Basename 12/23/11 1653  COLORURINE YELLOW  LABSPEC 1.022  PHURINE 5.5  GLUCOSEU NEGATIVE  HGBUR NEGATIVE  BILIRUBINUR SMALL*  KETONESUR 15*  PROTEINUR NEGATIVE  UROBILINOGEN 0.2  NITRITE NEGATIVE  LEUKOCYTESUR NEGATIVE    Imaging results:  No results found.  Other results: EKG: on 8/24 sinus rhythm, 1st AV degree block, right BBB which is unchanged from previous EKG.   Assessment & Plan by Problem: 78 y.o woman presenting with N/V/D and decreased PO.   1.Diarrhea. This has persisted for over a week. In addition she has had nausea, vomiting that improved with Reglan, and now dehydration with decreased po intake. She was admitted and treated for possible diverticulitis on 7/13 and was given Cipro and Flagyl. This could be C. Diff colitis v. gastroenteritis v diverticulitis v toxic megacolon. She has h/o colon cancer and s/p right hemicolectomy and bowel obstruction (GI following, Dr. Loreta Ave)  -Admit to telemtry -CT ab pelvis without contrast given creatinine of 1.7 (baseline of 1.3) -Ordered O&P, stool gm stain and cultures, C. Diff stool PCR,  -Ordered FOBT  - Rx Zofran 4 mg q8 prn iv, NS 100 cc/hr, - Tylenol 650 mg prn  - Will consider GI consult  2. Dizziness/Lightheadedness. Likely 2/2 to decreased po and dehydration. Orthostatics pending, could not be obtained in clinic.   -hydrating with NS at 167ml/hr,  concern for hx pulmonary edema  3. Acute kidney insuficiency. Baseline <1.1. Her Hg baseline ~10-11, it is 14 today, concern for hemoconcentration. She did have CT abdomen and pelvis with contrast on 8/21, this could be a component of IV dye contrast nephrotoxicity.  -Pt on NS 100 cc/hr  -monitor BMP   4. Adrenal suppression. Her original home dose was Cortef 10 mg qam and 5 mg qpm, however, her dose was increased to 20mg  qAM and 10mg  qPM since her discharge from the hospital. She had an appointment with Dr. Sharl Ma, her endocrinologist today as a hospital follow-up. Per Dr. Sharl Ma, will give her stress dose of 50mg  q6hrs with plan to discharge with 20mg  qAM and 10mg  qPM with follow up in 2-3 days after her discharge.   5. Metabolic acidosis. Anion gap of 16. This could be 2/2 lactic acidosis, explained by her diarrhea >7 days v hypovolemia (hypoperfusion) v AKI (her BUN:Cr 20:1) v. Lactic acidosis.   -IVF at 146ml/hr -Lactic acid level -Will check AM CMP.   6. Hx Gluten intolerance  -when pt can tolerate po start gluten free diet   7. Insomnia  -Rx home Trazadone 50 mg qhs   8. Hx MGUS  -pt following Hematology/oncology  9. Hx renal cysts  -pt following [facility]   10. DVT px  -Hep sq   11. F/E/N  -100 cc/hr NS  -NPO-will need gluten free diet when able to tolerate po  11. Dispo  -Admit med-surg    Signed: Ky Barban 12/23/2011, 6:06 PM

## 2011-12-23 NOTE — Progress Notes (Signed)
Pt arrived to the unit via stretcher. Pt is arousable. VS are stable. Pt has some redness on her saccrum no breakdown noted. Daughter at bedside. Denies any pain or distress. Will cont to monitor.

## 2011-12-23 NOTE — Telephone Encounter (Signed)
rx faxed in 

## 2011-12-23 NOTE — Telephone Encounter (Signed)
Call from pt's daughter.  C/o not feeling well for 8 days.  One trip to ED for N/V/D.   Talked to on call resident yesterday and advised pt to be seen in clinic today.  Please read last note.  Appointment today at 10:45

## 2011-12-24 DIAGNOSIS — R197 Diarrhea, unspecified: Secondary | ICD-10-CM

## 2011-12-24 DIAGNOSIS — N289 Disorder of kidney and ureter, unspecified: Secondary | ICD-10-CM

## 2011-12-24 DIAGNOSIS — E869 Volume depletion, unspecified: Secondary | ICD-10-CM

## 2011-12-24 LAB — CBC
HCT: 35.4 % — ABNORMAL LOW (ref 36.0–46.0)
MCH: 28.7 pg (ref 26.0–34.0)
MCV: 86.8 fL (ref 78.0–100.0)
Platelets: 197 10*3/uL (ref 150–400)
RDW: 13.5 % (ref 11.5–15.5)
WBC: 5.2 10*3/uL (ref 4.0–10.5)

## 2011-12-24 LAB — URINE CULTURE: Colony Count: 4000

## 2011-12-24 LAB — COMPREHENSIVE METABOLIC PANEL
ALT: 6 U/L (ref 0–35)
Calcium: 8.7 mg/dL (ref 8.4–10.5)
Creatinine, Ser: 1.59 mg/dL — ABNORMAL HIGH (ref 0.50–1.10)
GFR calc Af Amer: 33 mL/min — ABNORMAL LOW (ref >90)
GFR calc non Af Amer: 29 mL/min — ABNORMAL LOW (ref >90)
Glucose, Bld: 165 mg/dL — ABNORMAL HIGH (ref 70–99)
Sodium: 140 mEq/L (ref 135–145)
Total Protein: 6.4 g/dL (ref 6.0–8.3)

## 2011-12-24 MED ORDER — ENSURE COMPLETE PO LIQD
237.0000 mL | ORAL | Status: DC
  Administered 2011-12-24 – 2011-12-27 (×3): 237 mL via ORAL

## 2011-12-24 MED ORDER — ENSURE PUDDING PO PUDG
1.0000 | Freq: Three times a day (TID) | ORAL | Status: DC
  Administered 2011-12-24 – 2011-12-28 (×9): 1 via ORAL

## 2011-12-24 MED ORDER — TRAZODONE 25 MG HALF TABLET
25.0000 mg | ORAL_TABLET | Freq: Every day | ORAL | Status: DC
  Administered 2011-12-24: 25 mg via ORAL
  Filled 2011-12-24 (×4): qty 1

## 2011-12-24 NOTE — Progress Notes (Signed)
Subjective: She states that she is feeling better today. Her diarrhea has subsided. She had breakfast and tolerated it well, with no N/V.   Her daughter Rachel Bridges is at her bedside ans states that Rachel Bridges has been somnolent for almost 6 weeks now, since her Zoloft was switched to Trazodone (to help her sleep). Rachel Bridges states that she sometimes takes Trazodone 5-6 hours prior to going to bed and may get drowsy with this medication.   She denies dizziness, chest pain, cough, or abdominal pain.  Objective: Vital signs in last 24 hours: Filed Vitals:   12/23/11 1742 12/23/11 2018 12/24/11 0553 12/24/11 0933  BP: 96/71 108/74 126/76 133/71  Pulse: 64 66 55 59  Temp:  97.9 F (36.6 C) 97.4 F (36.3 C) 97.2 F (36.2 C)  TempSrc:  Oral Oral Oral  Resp:  18 18 18   Height:      Weight:  118 lb 13.3 oz (53.9 kg)    SpO2:  93% 92% 94%   Weight change:   Intake/Output Summary (Last 24 hours) at 12/24/11 1346 Last data filed at 12/24/11 0915  Gross per 24 hour  Intake 2076.67 ml  Output      0 ml  Net 2076.67 ml  Vitals reviewed.  General: resting in bed, in NAD  HEENT: PERRL, EOMI, no scleral icterus.MMM. Sunken eyes.  Cardiac: RRR, no rubs, murmurs or gallops  Pulm: clear to auscultation bilaterally, no wheezes, rales, or rhonchi  Abd: soft, nontender, distended, hypoactive bowel sounds  Skin: Sacral area with skin irritation but no ulcer.  Ext: warm and well perfused, no pedal edema. DP 1+ bilaterally  Neuro: alert and oriented X3, cranial nerves II-XII grossly intact, decreased bulk, normal tone   Lab Results: Basic Metabolic Panel:  Lab 12/24/11 1610 12/23/11 1545 12/23/11 1220  NA 140 -- 141  K 4.0 -- 4.1  CL 108 -- 109  CO2 20 -- 16*  GLUCOSE 165* -- 132*  BUN 39* -- 35*  CREATININE 1.59* -- 1.71*  CALCIUM 8.7 -- 9.3  MG -- 1.6 --  PHOS -- -- --   Liver Function Tests:  Lab 12/24/11 0600 12/23/11 1220  AST 11 15  ALT 6 9  ALKPHOS 93 104  BILITOT 0.2* 0.2*    PROT 6.4 7.5  ALBUMIN 2.6* 3.1*    Lab 12/18/11 1453  LIPASE 20  AMYLASE --   CBC:  Lab 12/24/11 0600 12/23/11 1220 12/18/11 1453  WBC 5.2 11.6* --  NEUTROABS -- 8.1* 5.1  HGB 11.7* 14.0 --  HCT 35.4* 42.0 --  MCV 86.8 89.2 --  PLT 197 303 --   Urinalysis:  Lab 12/23/11 1653 12/18/11 1527  COLORURINE YELLOW AMBER*  LABSPEC 1.022 1.022  PHURINE 5.5 5.0  GLUCOSEU NEGATIVE NEGATIVE  HGBUR NEGATIVE NEGATIVE  BILIRUBINUR SMALL* SMALL*  KETONESUR 15* NEGATIVE  PROTEINUR NEGATIVE 30*  UROBILINOGEN 0.2 1.0  NITRITE NEGATIVE NEGATIVE  LEUKOCYTESUR NEGATIVE NEGATIVE    Studies/Results: Ct Abdomen Pelvis Wo Contrast  12/23/2011  *RADIOLOGY REPORT*  Clinical Data: Fever, nausea, vomiting and diarrhea.  CT ABDOMEN AND PELVIS WITHOUT CONTRAST  Technique:  Multidetector CT imaging of the abdomen and pelvis was performed following the standard protocol without intravenous contrast.  Comparison: 12/18/2011.  Findings: There has been no significant change in marked dilatation of the right and transverse colon and lesser degree of dilatation of the distal small bowel.  The left and rectosigmoid colon remain normal in caliber, containing diverticula.  These are chronic findings  for this patient.  The kidneys remain somewhat small and associated with multiple cysts and large left perinephric cysts.  Previously demonstrated small areas of low density in the liver and spleen are not well visualized today without intravenous contrast. Unremarkable noncontrasted appearance of the pancreas, adrenal glands, urinary bladder, uterus and ovaries.  The gallbladder is not visualized.  No enlarged lymph nodes.  The previously demonstrated nodular density at the right lung base is not currently included.  Lumbar spine fixation hardware is noted.  IMPRESSION:  1.  No acute abnormality. 2.  Stable chronic marked dilatation of the right and transverse colon and lesser degree of dilatation of the distal small bowel,  compatible with chronic dysmotility.   Original Report Authenticated By: Darrol Angel, M.D.    Medications: I have reviewed the patient's current medications. Scheduled Meds:    . aspirin EC  81 mg Oral Daily  . heparin subcutaneous  5,000 Units Subcutaneous Q8H  . hydrocortisone sod succinate (SOLU-CORTEF) injection  50 mg Intravenous Q6H  . iohexol  20 mL Oral Q1 Hr x 2  . metoCLOPramide (REGLAN) injection  5 mg Intravenous Q12H  . paricalcitol  1 mcg Oral Q M,W,F  . traZODone  25 mg Oral QHS  . DISCONTD: aspirin  81 mg Oral Daily  . DISCONTD: traZODone  50 mg Oral QHS   Continuous Infusions:    . sodium chloride 100 mL/hr (12/23/11 2244)   PRN Meds:.acetaminophen, acetaminophen, ondansetron (ZOFRAN) IV, ondansetron Assessment/Plan: 78 y.o woman presenting with N/V/D and decreased PO.   1.Diarrhea. Now resolved. This has persisted for over a week. In addition she has had nausea, vomiting that improved with Reglan, and now dehydration with decreased po intake. She was admitted and treated for possible diverticulitis on 7/13 and was given Cipro and Flagyl. This could be C. Diff colitis v. gastroenteritis v diverticulitis v toxic megacolon. She has h/o colon cancer and s/p right hemicolectomy and bowel obstruction (GI following, Dr. Loreta Ave)  -CT ab pelvis without contrast given creatinine of 1.7 (baseline of 1.3) showing no acute changes from previous CT abdomen with contrast on 8/21 -Ordered O&P, stool gm stain and cultures, C. Diff stool PCR,  -Ordered FOBT  - Rx Zofran 4 mg q8 prn iv, NS 100 cc/hr,  - Tylenol 650 mg prn  - GI consulted (Dr. Loreta Ave) to see patient today. Appreciate recommendations  2. Dizziness/Lightheadedness. Likely 2/2 to decreased po and dehydration. No orthostatics per recorded values but taken after IVF -hydrating with NS at 165ml/hr, concern for hx pulmonary edema   3. Acute kidney insuficiency. Improving.  Baseline <1.1, Cr 1.7 on admission. Her Hg baseline  ~10-11, it is 14 on admission, concern for hemoconcentration. She did have CT abdomen and pelvis with contrast on 8/21, this could be a component of IV dye contrast nephrotoxicity. Creatinine at 1.59 today.  -Pt on NS 100 cc/hr  -monitor BMP   4. Adrenal suppression. Her original home dose was Cortef 10 mg qam and 5 mg qpm, however, her dose was increased to 20mg  qAM and 10mg  qPM since her discharge from the hospital. She had an appointment with Dr. Sharl Ma, her endocrinologist today as a hospital follow-up. Per Dr. Sharl Ma, will give her stress dose of 50mg  q6hrs with plan to discharge with 20mg  qAM and 10mg  qPM with follow up in 2-3 days after her discharge.  -Continue hydrocortisone 50mg  q6h for now. May decrease tomorrow.   5. Metabolic acidosis. Now resolved.  Anion gap of 16  on admission. This is likely multifactorial,  could be 2/2 lactic acidosis, explained by her diarrhea >7 days with hypovolemia (hypoperfusion) with AKI (her BUN:Cr 20:1) v. Lactic acidosis.  -IVF at 183ml/hr  -Lactic acid level  -Will check AM CMP.   6. Hx Gluten intolerance  -when pt can tolerate po start gluten free diet   7. Insomnia. Per pt and her daughter, this was started about 6 weeks ago, (in place of Zoloft to help her sleep) and has made the pt mildly somnolent during the day. Will decrease dose to 25mg  qHS -Decreased home Trazadone from 50 to 25 mg qhs   8. Hx MGUS  -pt following Hematology/oncology   9. Hx renal cysts  -pt following [facility]   10. DVT px  -Hep sq   11. F/E/N  -100 cc/hr NS  -NPO-will need gluten free diet when able to tolerate po     LOS: 1 day   Ky Barban 12/24/2011, 1:46 PM

## 2011-12-24 NOTE — Progress Notes (Signed)
Ms. Levels expresses the wish for an order of DNR as she has previously discussed with her daughters and with her doctors. Her daughter, [name], states over the phone that the patient has made this decision in the past.  Order has been placed for DNR.

## 2011-12-24 NOTE — Progress Notes (Signed)
Physical Therapy Evaluation Patient Details Name: Rachel Bridges MRN: [id number] DOB: December 19, 1924 Today's Date: 12/24/2011 Time: 4098-1191 PT Time Calculation (min): 20 min  PT Assessment / Plan / Recommendation Clinical Impression  78 yo female admitted with diarrhea presents with decr activity tolerance, decr functional mobility; Will benefit from PT to maximize independence and safety with functional mobiltiy and enable safe dc home with family support    PT Assessment  Patient needs continued PT services    Follow Up Recommendations  Home health PT;Supervision/Assistance - 24 hour    Barriers to Discharge        Equipment Recommendations  None recommended by PT (May ammend this with more info re: home equipment)    Recommendations for Other Services     Frequency Min 3X/week    Precautions / Restrictions Precautions Precautions: Fall   Pertinent Vitals/Pain No specific reports of pain or dizziness      Mobility  Bed Mobility Bed Mobility: Supine to Sit;Sitting - Scoot to Edge of Bed Supine to Sit: 3: Mod assist;With rails Sitting - Scoot to Edge of Bed: 4: Min assist;With rail Details for Bed Mobility Assistance: cues to initiate and for technique; Physical assist to lift trunk from bed Transfers Transfers: Sit to Stand;Stand to Sit Sit to Stand: 3: Mod assist;From bed Stand to Sit: 3: Mod assist;To chair/3-in-1;With upper extremity assist Details for Transfer Assistance: cues for hand placement, safety, and technique Ambulation/Gait Ambulation/Gait Assistance: 3: Mod assist Ambulation Distance (Feet): 6 Feet Assistive device: 2 person hand held assist Ambulation/Gait Assistance Details: Short steps, trunk flexed; unsteady, with definite need for UE support for steadiness Gait Pattern: Shuffle    Exercises     PT Diagnosis: Difficulty walking;Generalized weakness  PT Problem List: Decreased strength;Decreased activity tolerance;Decreased balance;Decreased  mobility;Decreased knowledge of use of DME PT Treatment Interventions: DME instruction;Gait training;Stair training;Functional mobility training;Therapeutic activities;Therapeutic exercise;Patient/family education   PT Goals Acute Rehab PT Goals PT Goal Formulation: With patient Time For Goal Achievement: 01/07/12 Potential to Achieve Goals: Good Pt will go Supine/Side to Sit: with modified independence PT Goal: Supine/Side to Sit - Progress: Goal set today Pt will go Sit to Supine/Side: with modified independence PT Goal: Sit to Supine/Side - Progress: Goal set today Pt will go Sit to Stand: with supervision PT Goal: Sit to Stand - Progress: Goal set today Pt will go Stand to Sit: with supervision PT Goal: Stand to Sit - Progress: Goal set today Pt will Ambulate: >150 feet;with supervision;with rolling walker PT Goal: Ambulate - Progress: Goal set today Pt will Go Up / Down Stairs: 3-5 stairs;with min assist;with rail(s) PT Goal: Up/Down Stairs - Progress: Goal set today  Visit Information  Last PT Received On: 12/24/11 Assistance Needed: +1    Subjective Data  Subjective: Reports is comfortable OOB Patient Stated Goal: home   Prior Functioning  Home Living Lives With: Family Available Help at Discharge: Family;Available 24 hours/day Type of Home: House Home Access: Stairs to enter [employer] of Steps: 4 Entrance Stairs-Rails: Right Home Layout: One level Bathroom Shower/Tub: Nurse, adult Accessibility: Yes How Accessible: Accessible via walker Home Adaptive Equipment: Shower chair with back;Walker - rolling (Will need to verify home equipment) Prior Function Level of Independence: Needs assistance Needs Assistance: Bathing;Dressing;Toileting;Meal Prep;Light Housekeeping;Gait Meal Prep: Total Light Housekeeping: Total Gait Assistance: ?min assist with RW; Will need to verify Able to Take Stairs?: Yes Communication Communication:  (difficult  to understand at time)    Cognition  Overall Cognitive Status:  Appears within functional limits for tasks assessed/performed Arousal/Alertness: Awake/alert Orientation Level: Appears intact for tasks assessed Behavior During Session: [facility] for tasks performed    Extremity/Trunk Assessment Right Upper Extremity Assessment RUE ROM/Strength/Tone: [facility] for tasks assessed Left Upper Extremity Assessment LUE ROM/Strength/Tone: WFL for tasks assessed Right Lower Extremity Assessment RLE ROM/Strength/Tone: Deficits RLE ROM/Strength/Tone Deficits: Generalized weakness, requiring physical assist for sit to stand Left Lower Extremity Assessment LLE ROM/Strength/Tone: Deficits LLE ROM/Strength/Tone Deficits: Generalized weakness, requiring physical assist for sit to stand   Balance    End of Session PT - End of Session Equipment Utilized During Treatment: Gait belt Activity Tolerance: Patient tolerated treatment well Patient left: in chair;with call bell/phone within reach;with chair alarm set;with nursing in room Nurse Communication: Mobility status  GP     Olen Pel [place], [place] [phone]  12/24/2011, 2:58 PM

## 2011-12-24 NOTE — Progress Notes (Signed)
INITIAL ADULT NUTRITION ASSESSMENT Date: 12/24/2011   Time: 2:09 PM  Reason for Assessment: Malnutrition Screening  ASSESSMENT: Female 78 y.o.  Dx: Diarrhea  Hx:  Past Medical History  Diagnosis Date  . MGUS (monoclonal gammopathy of unknown significance)      [facility]  . Anxiety   . History of colon cancer      History of bowel  obstruction. on treatment with Colace and MiraLax.  followed by Dr. Loreta Ave and Dr. Purnell Shoemaker  . Depression   . Diverticulosis of colon   . Osteopenia   . Lumbar spinal stenosis   . Atrophic vaginitis   . Renal cyst   . History of tobacco abuse     at least 64 pack year. Started about age 77 and continued until about age 60.  Marland Kitchen Dermatitis   . Hip pain      History  . Actinic keratosis   . Dementia   . Adrenal suppression     central, on chronic steroids, followed by Dr. Sharl Ma of endocrinology  . Gluten intolerance     August 2011 endoscopy with biopsy negative for celiac disease. Prior to this patient had elevated TTG IgG of 93 (normal <20). Dr Loreta Ave made clinical diagnosis of gluten intolerance based on diarrhea/symptoms from eating gluten. Needs GF diet.    Past Surgical History  Procedure Date  . Hemicolectomy      Right-sided  .  resection of ovarian cyst   . Cataract extraction      Left eye  . Knee arthroscopy      Left knee  . Appendectomy   . Carpal tunnel release   . Lumbar laminectomy   . Lumbar fusion   . Replacement total knee     left knee   Related Meds:     . aspirin EC  81 mg Oral Daily  . heparin subcutaneous  5,000 Units Subcutaneous Q8H  . hydrocortisone sod succinate (SOLU-CORTEF) injection  50 mg Intravenous Q6H  . iohexol  20 mL Oral Q1 Hr x 2  . metoCLOPramide (REGLAN) injection  5 mg Intravenous Q12H  . paricalcitol  1 mcg Oral Q M,W,F  . traZODone  25 mg Oral QHS  . DISCONTD: aspirin  81 mg Oral Daily  . DISCONTD: traZODone  50 mg Oral QHS   Ht: 5\' 4"  (162.6  cm)  Wt: 118 lb 13.3 oz (53.9 kg)  Ideal Wt: 54.5 kg % Ideal Wt: 99%  Wt Readings from Last 5 Encounters:  12/23/11 118 lb 13.3 oz (53.9 kg)  12/23/11 119 lb (53.978 kg)  12/04/11 132 lb 11.2 oz (60.192 kg)  11/22/11 143 lb 1.3 oz (64.9 kg)  11/04/11 125 lb 8 oz (56.926 kg)  Usual Wt: 132 lb % Usual Wt: 89%  Body mass index is 20.40 kg/(m^2). Weight is WNL.  Food/Nutrition Related Hx: poor appetite x 9 days  Labs:  CMP     Component Value Date/Time   NA 140 12/24/2011 0600   K 4.0 12/24/2011 0600   CL 108 12/24/2011 0600   CO2 20 12/24/2011 0600   GLUCOSE 165* 12/24/2011 0600   BUN 39* 12/24/2011 0600   CREATININE 1.59* 12/24/2011 0600   CREATININE 1.71* 12/23/2011 1220   CALCIUM 8.7 12/24/2011 0600   PROT 6.4 12/24/2011 0600   ALBUMIN 2.6* 12/24/2011 0600   AST 11 12/24/2011 0600   ALT 6 12/24/2011 0600   ALKPHOS 93 12/24/2011 0600   BILITOT 0.2*  12/24/2011 0600   GFRNONAA 29* 12/24/2011 0600   GFRAA 33* 12/24/2011 0600   CBG (last 3)  No results found for this basename: GLUCAP:3 in the last 72 hours  Lab Results  Component Value Date   HGBA1C 5.1 02/21/2011    Intake/Output Summary (Last 24 hours) at 12/24/11 1415 Last data filed at 12/24/11 0915  Gross per 24 hour  Intake 2076.67 ml  Output      0 ml  Net 2076.67 ml   Diet Order: Gluten Restricted  Supplements/Tube Feeding: none  IVF:    sodium chloride Last Rate: 100 mL/hr (12/23/11 2244)   Estimated Nutritional Needs:   Kcal: 1250 - 1500 kcal Protein:  55 - 65 grams protein Fluid:  1.5 - 1.7 liters daily  Pt admitted with n/v/d and fever with poor PO intake x 9 days. Went to the ED 6 days ago with these complaints, was given Reglan, and was d/c back home. Continues to have symptoms. Recently fell on 8/20, no head injury. Past medical hx significant for adrenal insufficiency, vitamin B12 deficiency, colon cancer (s/p hemicolectomy, no chemo or radiation, in 2000.)  Per chart, pt had endoscopy on August 2011  with biopsy negative for celiac disease. Prior to this patient had elevated TTG IgG of 93 (normal <20). Dr Loreta Ave made clinical diagnosis of gluten intolerance based on diarrhea/symptoms from eating gluten.   Pt's daughter states that pt has been somnolent for almost 6 weeks now - per H&P.  Pt states that her appetite was poor PTA. Unable to provide specific information. She says that her daughter does the grocery shopping and prepares meals for her. She confirms that she follows a gluten-free diet. Pt is edentulous, says her dentures are not with her. Noted RN added "Mechanical Soft" restrictions to diet order.  Pt meets criteria for severe malnutrition in the context of acute illness as evidenced by 11% wt loss x 3 weeks, moderate temporal wasting, and suspected intake of <50% of estimated energy intake x at least 5 days.  NUTRITION DIAGNOSIS: -Inadequate oral intake (NI-2.1).  Status: Ongoing  RELATED TO: poor appetite  AS EVIDENCE BY: pt report and recent wt loss  MONITORING/EVALUATION(Goals): Goal: Pt to meet >/= 90% of their estimated nutrition needs Monitor: weights, labs, PO intake, I/O's  EDUCATION NEEDS: -No education needs identified at this time  INTERVENTION: 1. Ensure Complete po daily, each supplement provides 350 kcal and 13 grams of protein. This is a gluten-free product. 2. Ensure Pudding po TID, each supplement provides 170 kcal and 4 grams of protein. This is a gluten-free product. 3. RD to continue to follow nutrition care plan  DOCUMENTATION CODES Per approved criteria  -Severe malnutrition in the context of acute illness or injury   Jarold Motto MS, RD, LDN Pager: [phone] After-hours pager: [phone]

## 2011-12-24 NOTE — H&P (Signed)
Internal Medicine Attending Admission Note Date: 12/24/2011  Patient name: Rachel Bridges Medical record number: [id number] Date of birth: 1924/07/04 Age: 78 y.o. Gender: female  I saw and evaluated the patient and discussed her care with house staff. I reviewed the resident's note and I agree with the resident's findings and plan as documented in the resident's note, with the following additional comments.  Chief Complaint(s): Diarrhea  History - key components related to admission: Patient is an 78 year old woman with history of chronic renal insufficiency, colon cancer status post right-sided hemicolectomy, MGUS (monoclonal gammopathy of unknown significance), anxiety/depression, colonic diverticulosis, osteopenia, and other problems as outlined in medical history, admitted with complaint of diarrhea.  Other reported complaints include nausea/vomiting and fever, dizziness, increased weakness, and confusion.   Physical Exam - key components related to admission:  Filed Vitals:   12/23/11 2018 12/24/11 0553 12/24/11 0933 12/24/11 1405  BP: 108/74 126/76 133/71 124/66  Pulse: 66 55 59 63  Temp: 97.9 F (36.6 C) 97.4 F (36.3 C) 97.2 F (36.2 C) 98.1 F (36.7 C)  TempSrc: Oral Oral Oral Oral  Resp: 18 18 18 18   Height:      Weight: 118 lb 13.3 oz (53.9 kg)     SpO2: 93% 92% 94% 94%    General: Alert, no distress Lungs: Clear Heart: Regular; no extra sounds or murmurs Abdomen: Bowel sounds active; distended, tympanitic, nontender. Extremities: No edema  Lab results:   Basic Metabolic Panel:  Basename 12/24/11 0600 12/23/11 1545 12/23/11 1220  NA 140 -- 141  K 4.0 -- 4.1  CL 108 -- 109  CO2 20 -- 16*  GLUCOSE 165* -- 132*  BUN 39* -- 35*  CREATININE 1.59* -- 1.71*  CALCIUM 8.7 -- 9.3  MG -- 1.6 --  PHOS -- -- --   Liver Function Tests:  Basename 12/24/11 0600 12/23/11 1220  AST 11 15  ALT 6 9  ALKPHOS 93 104  BILITOT 0.2* 0.2*  PROT 6.4 7.5  ALBUMIN 2.6*  3.1*    CBC:  Basename 12/24/11 0600 12/23/11 1220  WBC 5.2 11.6*  NEUTROABS -- 8.1*  HGB 11.7* 14.0  HCT 35.4* 42.0  MCV 86.8 89.2  PLT 197 303   Urinalysis    Component Value Date/Time   COLORURINE YELLOW 12/23/2011 1653   APPEARANCEUR HAZY* 12/23/2011 1653   LABSPEC 1.022 12/23/2011 1653   PHURINE 5.5 12/23/2011 1653   GLUCOSEU NEGATIVE 12/23/2011 1653   HGBUR NEGATIVE 12/23/2011 1653   BILIRUBINUR SMALL* 12/23/2011 1653   KETONESUR 15* 12/23/2011 1653   PROTEINUR NEGATIVE 12/23/2011 1653   UROBILINOGEN 0.2 12/23/2011 1653   NITRITE NEGATIVE 12/23/2011 1653   LEUKOCYTESUR NEGATIVE 12/23/2011 1653       Imaging results:  Ct Abdomen Pelvis Wo Contrast  12/23/2011  *RADIOLOGY REPORT*  Clinical Data: Fever, nausea, vomiting and diarrhea.  CT ABDOMEN AND PELVIS WITHOUT CONTRAST  Technique:  Multidetector CT imaging of the abdomen and pelvis was performed following the standard protocol without intravenous contrast.  Comparison: 12/18/2011.  Findings: There has been no significant change in marked dilatation of the right and transverse colon and lesser degree of dilatation of the distal small bowel.  The left and rectosigmoid colon remain normal in caliber, containing diverticula.  These are chronic findings for this patient.  The kidneys remain somewhat small and associated with multiple cysts and large left perinephric cysts.  Previously demonstrated small areas of low density in the liver and spleen are not well visualized today without  intravenous contrast. Unremarkable noncontrasted appearance of the pancreas, adrenal glands, urinary bladder, uterus and ovaries.  The gallbladder is not visualized.  No enlarged lymph nodes.  The previously demonstrated nodular density at the right lung base is not currently included.  Lumbar spine fixation hardware is noted.  IMPRESSION:  1.  No acute abnormality. 2.  Stable chronic marked dilatation of the right and transverse colon and lesser degree of  dilatation of the distal small bowel, compatible with chronic dysmotility.   Original Report Authenticated By: Darrol Angel, M.D.      Assessment & Plan by Problem:  1. Diarrhea, nausea,vomiting.  Etiology is unclear.  Differential includes Clostridium difficile colitis, viral gastroenteritis, or other problem.  Patient has a chronically dilated colon and small bowel consistent with dysmotility; she also has a history of small bowel obstruction, but no evidence of bowel obstruction on this CT scan. Today nurse reports no diarrheal stools this morning, and patient denies nausea.  She was followed in the past by Dr. Loreta Ave, but has been discharged from their practice.  Plan is stool culture, O&P, and C. difficile toxin; antiemetics as needed; if symptoms persist, consider inpatient GI consult.  2.  Volume depletion.  Likely due to diarrhea and vomiting.  Plan is IV normal saline volume replacement.  3.  Acute renal insufficiency likely due to volume depletion.  Renal function is improving with volume replacement; will follow.  4.  Chronic adrenal insufficiency.  Plan is stress dose steroids in the acute setting.  5.  Metabolic acidosis.  Likely multifactorial, with contribution from acute renal insufficiency and bicarbonate loss.  This is improving and anion gap has normalized.

## 2011-12-24 NOTE — Consult Note (Signed)
WOC consult Note Reason for Consult: eval for sacral pressure ulcer.  Pt does not have any open areas today but does have some redness that blanches.  Presents more like MASD (moisture associated skin damage) related to frequent diarrhea over last few days.  Wound type:MASD (moisture associated skin damage) Measurement: not really measurable but does involve the gluteal clef and buttocks Periwound: intact  Dressing procedure/placement/frequency: continue zinc based barrier for protection from feces.  Turn and reposition frequently.  Diarrhea lessening now.    Re consult if needed, will not follow at this time. Thanks  Taziyah Iannuzzi [employer], CWOCN [phone])

## 2011-12-25 DIAGNOSIS — E2749 Other adrenocortical insufficiency: Secondary | ICD-10-CM

## 2011-12-25 DIAGNOSIS — E43 Unspecified severe protein-calorie malnutrition: Secondary | ICD-10-CM | POA: Diagnosis present

## 2011-12-25 LAB — BASIC METABOLIC PANEL
CO2: 20 mEq/L (ref 19–32)
Calcium: 8.5 mg/dL (ref 8.4–10.5)
Creatinine, Ser: 1.29 mg/dL — ABNORMAL HIGH (ref 0.50–1.10)
GFR calc non Af Amer: 37 mL/min — ABNORMAL LOW (ref >90)
Glucose, Bld: 127 mg/dL — ABNORMAL HIGH (ref 70–99)

## 2011-12-25 LAB — CBC
Hemoglobin: 11.3 g/dL — ABNORMAL LOW (ref 12.0–15.0)
MCH: 29.7 pg (ref 26.0–34.0)
MCHC: 33.9 g/dL (ref 30.0–36.0)
MCV: 87.4 fL (ref 78.0–100.0)
Platelets: 211 10*3/uL (ref 150–400)
RBC: 3.81 MIL/uL — ABNORMAL LOW (ref 3.87–5.11)

## 2011-12-25 NOTE — Progress Notes (Signed)
Internal Medicine Attending  Date: 12/25/2011  Patient name: Rachel Bridges Medical record number: [id number] Date of birth: 1924-05-06 Age: 78 y.o. Gender: female  I saw and evaluated the patient and discussed her care on a.m. rounds with house staff.  I reviewed the resident's note by Dr. Garald Braver and I agree with the resident's findings and plans as documented in her note, with the following additional comments.  Patient is still having diarrheal stools; abdomen is distended and tympanitic as before.  Agree with plan for GI consult given the chronicity of this problem.

## 2011-12-25 NOTE — Clinical Documentation Improvement (Signed)
MALNUTRITION DOCUMENTATION CLARIFICATION  THIS DOCUMENT IS NOT A PERMANENT PART OF THE MEDICAL RECORD  TO RESPOND TO THE THIS QUERY, FOLLOW THE INSTRUCTIONS BELOW:  1. If needed, update documentation for the patient's encounter via the notes activity.  2. Access this query again and click edit on the In [employer].  3. After updating, or not, click F2 to complete all highlighted (required) fields concerning your review. Select "additional documentation in the medical record" OR "no additional documentation provided".  4. Click Sign note button.  5. The deficiency will fall out of your In Basket *Please let us know if you are not able to complete this workflow by phone or e-mail (listed below).  Please update your documentation within the medical record to reflect your response to this query.                                                                                        12/25/11   Dear Dr. Meredith Pel / Associates,  In a better effort to capture your patient's severity of illness, reflect appropriate length of stay and utilization of resources, a review of the patient medical record has revealed the following indicators.    Based on your clinical judgment, please clarify and document in a progress note and/or discharge summary the clinical condition associated with the following supporting information:  In responding to this query please exercise your independent judgment.  The fact that a query is asked, does not imply that any particular answer is desired or expected.  Possible Clinical Conditions?  _______Mild Malnutrition  _______Moderate Malnutrition _______Severe Malnutrition   _______Protein Calorie Malnutrition _______Severe Protein Calorie Malnutrition _______Other Condition________________ _______Cannot clinically determine     Supporting Information: Risk Factors:(As per notes Per Nutrition Consult) : Pt meets criteria for severe malnutrition in the context of  acute illness as evidenced by 11% wt loss x 3 weeks, moderate temporal wasting, and suspected intake of <50% of estimated energy intake x at least 5 days.   Signs & Symptoms: Ht:  5'4"     Wt: 118lbs  BMI:  20.40 kg/(m^2)   Diagnostics:(Nutrition consult) -Severe malnutrition in the context of acute illness or injury   Treatment  INTERVENTION: 1. Ensure Complete po daily, each supplement provides 350 kcal and 13 grams of protein. This is a gluten-free product. 2. Ensure Pudding po TID, each supplement provides 170 kcal and 4 grams of protein. This is a gluten-free product. 3. RD to continue to follow nutrition care plan Diet Order: Gluten Restricted     You may use possible, probable, or suspect with inpatient documentation. possible, probable, suspected diagnoses MUST be documented at the time of discharge  Reviewed: additional documentation in the medical record  Thank You,  Joanette Gula Delk RN, BSN Clinical Documentation Specialist: [phone] Pager Health Information Management 

## 2011-12-25 NOTE — Care Management Note (Signed)
    Page 1 of 2   12/28/2011     12:46:19 PM   CARE MANAGEMENT NOTE 12/28/2011  Patient:  Rachel Bridges, Rachel Bridges   Account Number:  [id number]  Date Initiated:  12/25/2011  Documentation initiated by:  Jim Like  Subjective/Objective Assessment:   Pt is an 78 yr old admitted with diarrhea and dehydration     Action/Plan:   Continue to follow for CM/discharge planning needs   Anticipated DC Date:  12/27/2011   Anticipated DC Plan:  HOME W HOME HEALTH SERVICES      DC Planning Services  CM consult      [facility] Choice  Resumption Of Svcs/PTA Provider   Choice offered to / List presented to:     DME arranged  HOSPITAL BED      DME agency  [facility]     [facility] arranged  HH-1 RN  HH-2 PT  HH-3 OT      [facility] agency  [facility]   Status of service:  Completed, signed off Medicare Important Message given?   (If response is "NO", the following Medicare IM given date fields will be blank) Date Medicare IM given:   Date Additional Medicare IM given:    Discharge Disposition:  HOME W HOME HEALTH SERVICES  Per UR Regulation:  Reviewed for med. necessity/level of care/duration of stay  If discussed at Long Length of Stay Meetings, dates discussed:    Comments:  12/28/2011 1245 Spoke to daughter, [name] and states the hospital bed was delivered this am. Isidoro Donning RN CCM Case Mgmt phone [phone]  12/28/2011 1130 Faxed orders, F2F and facesheet to [place]. Spoke to rep, Vernona Rieger. No d/c order at this time. Isidoro Donning RN CCM Case Mgmt phone [phone]  12/26/2011  [address] RN, [place]  [phone] Patient reports [facility] care services RN, PT, OT prior to admission.  [facility]Debbie Barnes: verified patient is active for home health RN, PT, OT

## 2011-12-25 NOTE — Consult Note (Signed)
[facility] CONSULT Reason for consult: Nausea and vomiting and diarrhea Referring Physician: Internal medicine teaching service  Rachel Bridges is an 78 y.o. female.  HPI: Patient admitted 2 days ago after approximately 7-10 days nausea and diarrhea. She has a history of colon cancer and has undergone prior right hemicolectomy has been followed by Dr. Loreta Ave in the past but was dismissed from this practice. Her last colonoscopy 2009 revealed healthy anastomosis and pandiverticulosis. The patient apparently had an early stage cancer and she reports that she did not require chemotherapy. We were asked to see her regarding her nausea vomiting and diarrhea. For the past 2 days this is been bothering her in for unclear reasons she reports that her symptoms are gone completely. She currently denies diarrhea and is sitting up eating her supper. She states that the reason that she was nauseated was because the food was so good that she ate too much. She currently denies having any problems. She has a history of empty sella syndrome and secondary hypocalcemia, vitamin D deficiency, adrenal insufficiency and hypokalemia. She has been followed in the internal medicine outpatient clinic and has seen Dr. Sharl Ma for endocrinology. She's been on ergocalciferol as well as hydrocortisone supplementation. Her other problems include osteoporosis and spinal stenosis with chronic joint pain, dementia. She's had a history of gluten intolerance with elevated TTG in the past but apparently normal biopsies for celiac disease.  Past Medical History  Diagnosis Date  . MGUS (monoclonal gammopathy of unknown significance)      [facility]  . Anxiety   . History of colon cancer      History of bowel  obstruction. on treatment with Colace and MiraLax.  followed by Dr. Loreta Ave and Dr. Purnell Shoemaker  . Depression   . Diverticulosis of colon   . Osteopenia   . Lumbar spinal stenosis   .  Atrophic vaginitis   . Renal cyst   . History of tobacco abuse     at least 64 pack year. Started about age 51 and continued until about age 50.  Marland Kitchen Dermatitis   . Hip pain      History  . Actinic keratosis   . Dementia   . Adrenal suppression     central, on chronic steroids, followed by Dr. Sharl Ma of endocrinology  . Gluten intolerance     August 2011 endoscopy with biopsy negative for celiac disease. Prior to this patient had elevated TTG IgG of 93 (normal <20). Dr Loreta Ave made clinical diagnosis of gluten intolerance based on diarrhea/symptoms from eating gluten. Needs GF diet.     Past Surgical History  Procedure Date  . Hemicolectomy      Right-sided  .  resection of ovarian cyst   . Cataract extraction      Left eye  . Knee arthroscopy      Left knee  . Appendectomy   . Carpal tunnel release   . Lumbar laminectomy   . Lumbar fusion   . Replacement total knee     left knee    No family history on file.  Social History:  reports that she quit smoking about 23 years ago. Her smoking use included Cigarettes. She has a 24 pack-year smoking history. She does not have any smokeless tobacco history on file. She reports that she does not drink alcohol or use illicit drugs.  Allergies:  Allergies  Allergen Reactions  . Morphine And Related Other (See Comments)  delirium  . Oxycodone Hcl (Oxycodone Hcl) Other (See Comments)    Unknown reaction  . Pantoprazole (Pantoprazole Sodium) Nausea Only    Medications;    . aspirin EC  81 mg Oral Daily  . feeding supplement  237 mL Oral Q24H  . feeding supplement  1 Container Oral TID BM  . heparin subcutaneous  5,000 Units Subcutaneous Q8H  . hydrocortisone sod succinate (SOLU-CORTEF) injection  50 mg Intravenous Q6H  . metoCLOPramide (REGLAN) injection  5 mg Intravenous Q12H  . paricalcitol  1 mcg Oral Q M,W,F  . traZODone  25 mg Oral QHS   PRN Meds acetaminophen, acetaminophen, ondansetron (ZOFRAN) IV, ondansetron Results  for orders placed during the hospital encounter of 12/23/11 (from the past 48 hour(s))  LACTIC ACID, PLASMA     Status: Normal   Collection Time   12/23/11  6:17 PM      Component Value Range Comment   Lactic Acid, Venous 1.2  0.5 - 2.2 mmol/L   CLOSTRIDIUM DIFFICILE BY PCR     Status: Normal   Collection Time   12/24/11 12:00 AM      Component Value Range Comment   C difficile by pcr NEGATIVE  NEGATIVE   STOOL CULTURE     Status: Normal (Preliminary result)   Collection Time   12/24/11 12:01 AM      Component Value Range Comment   Specimen Description STOOL      Special Requests NONE      Culture Culture reincubated for better growth      Report Status PENDING     COMPREHENSIVE METABOLIC PANEL     Status: Abnormal   Collection Time   12/24/11  6:00 AM      Component Value Range Comment   Sodium 140  135 - 145 mEq/L    Potassium 4.0  3.5 - 5.1 mEq/L    Chloride 108  96 - 112 mEq/L    CO2 20  19 - 32 mEq/L    Glucose, Bld 165 (*) 70 - 99 mg/dL    BUN 39 (*) 6 - 23 mg/dL    Creatinine, Ser 1.30 (*) 0.50 - 1.10 mg/dL    Calcium 8.7  8.4 - 86.5 mg/dL    Total Protein 6.4  6.0 - 8.3 g/dL    Albumin 2.6 (*) 3.5 - 5.2 g/dL    AST 11  0 - 37 U/L    ALT 6  0 - 35 U/L    Alkaline Phosphatase 93  39 - 117 U/L    Total Bilirubin 0.2 (*) 0.3 - 1.2 mg/dL    GFR calc non Af Amer 29 (*) >90 mL/min    GFR calc Af Amer 33 (*) >90 mL/min   CBC     Status: Abnormal   Collection Time   12/24/11  6:00 AM      Component Value Range Comment   WBC 5.2  4.0 - 10.5 K/uL    RBC 4.08  3.87 - 5.11 MIL/uL    Hemoglobin 11.7 (*) 12.0 - 15.0 g/dL    HCT 78.4 (*) 69.6 - 46.0 %    MCV 86.8  78.0 - 100.0 fL    MCH 28.7  26.0 - 34.0 pg    MCHC 33.1  30.0 - 36.0 g/dL    RDW 29.5  28.4 - 13.2 %    Platelets 197  150 - 400 K/uL   CBC     Status: Abnormal   Collection Time  12/25/11  5:55 AM      Component Value Range Comment   WBC 7.7  4.0 - 10.5 K/uL    RBC 3.81 (*) 3.87 - 5.11 MIL/uL    Hemoglobin  11.3 (*) 12.0 - 15.0 g/dL    HCT 45.4 (*) 09.8 - 46.0 %    MCV 87.4  78.0 - 100.0 fL    MCH 29.7  26.0 - 34.0 pg    MCHC 33.9  30.0 - 36.0 g/dL    RDW 11.9  14.7 - 82.9 %    Platelets 211  150 - 400 K/uL   BASIC METABOLIC PANEL     Status: Abnormal   Collection Time   12/25/11  5:55 AM      Component Value Range Comment   Sodium 145  135 - 145 mEq/L    Potassium 3.9  3.5 - 5.1 mEq/L    Chloride 113 (*) 96 - 112 mEq/L    CO2 20  19 - 32 mEq/L    Glucose, Bld 127 (*) 70 - 99 mg/dL    BUN 37 (*) 6 - 23 mg/dL    Creatinine, Ser 5.62 (*) 0.50 - 1.10 mg/dL    Calcium 8.5  8.4 - 13.0 mg/dL    GFR calc non Af Amer 37 (*) >90 mL/min    GFR calc Af Amer 43 (*) >90 mL/min   LACTIC ACID, PLASMA     Status: Abnormal   Collection Time   12/25/11 11:00 AM      Component Value Range Comment   Lactic Acid, Venous 2.6 (*) 0.5 - 2.2 mmol/L     Ct Abdomen Pelvis Wo Contrast  12/23/2011  *RADIOLOGY REPORT*  Clinical Data: Fever, nausea, vomiting and diarrhea.  CT ABDOMEN AND PELVIS WITHOUT CONTRAST  Technique:  Multidetector CT imaging of the abdomen and pelvis was performed following the standard protocol without intravenous contrast.  Comparison: 12/18/2011.  Findings: There has been no significant change in marked dilatation of the right and transverse colon and lesser degree of dilatation of the distal small bowel.  The left and rectosigmoid colon remain normal in caliber, containing diverticula.  These are chronic findings for this patient.  The kidneys remain somewhat small and associated with multiple cysts and large left perinephric cysts.  Previously demonstrated small areas of low density in the liver and spleen are not well visualized today without intravenous contrast. Unremarkable noncontrasted appearance of the pancreas, adrenal glands, urinary bladder, uterus and ovaries.  The gallbladder is not visualized.  No enlarged lymph nodes.  The previously demonstrated nodular density at the right lung  base is not currently included.  Lumbar spine fixation hardware is noted.  IMPRESSION:  1.  No acute abnormality. 2.  Stable chronic marked dilatation of the right and transverse colon and lesser degree of dilatation of the distal small bowel, compatible with chronic dysmotility.   Original Report Authenticated By: Darrol Angel, M.D.                 Blood pressure 126/75, pulse 81, temperature 98.5 F (36.9 C), temperature source Oral, resp. rate 18, height 5\' 4"  (1.626 m), weight 64.4 kg (141 lb 15.6 oz), SpO2 95.00%.  Physical exam:   Gen.-pleasant thin white female sitting in a hospital bed eating supper Eyes-sclerae are nonicteric Lungs-clear Heart-regular rhythm without murmurs or gallops Abdomen-slightly distended but soft and nontender  Assessment: 1. Nausea and vomiting and diarrhea. This apparently has been going on for some time however  the patient currently denies symptoms is sitting up eating supper. C. differential is negative ova and parasite exam stool culture etc. still pending. CT scan shows slightly dilated colon but no acute abnormalities. It's hard to know if she is better or just simply going to a asymptomatic phase. 2. History of colon cancer status post right hemicolectomy. Last colonoscopy 4 years ago was negative. In her age of 66 would not recommend surveillance colonoscopies in the she had positive stool 3. Questionable celiac disease. Has history of elevated TTG negative biopsies. She's unable to tell me if she is on a gluten-free diet as an outpatient. May be reasonable to repeat TTG.  Plan: We will follow her with you. We'll go ahead and order TTG and this has not been done.   Reynard Christoffersen JR,Ademola Vert L 12/25/2011, 5:35 PM

## 2011-12-25 NOTE — Progress Notes (Signed)
Physical Therapy Treatment Patient Details Name: Rachel Bridges MRN: [id number] DOB: 11-19-1924 Today's Date: 12/25/2011 Time: 1216-1232 PT Time Calculation (min): 16 min  PT Assessment / Plan / Recommendation Comments on Treatment Session  admitted with diarrhea; Making progress with mobility despite feeling worse today than yesterday    Follow Up Recommendations  Home health PT;Supervision/Assistance - 24 hour    Barriers to Discharge        Equipment Recommendations  None recommended by PT    Recommendations for Other Services    Frequency Min 3X/week   Plan Discharge plan remains appropriate    Precautions / Restrictions Precautions Precautions: Fall   Pertinent Vitals/Pain Reports feeling worse than yesterday, non-specific re: pain    Mobility  Bed Mobility Bed Mobility: Supine to Sit;Sitting - Scoot to Edge of Bed Supine to Sit: 4: Min assist;With rails Sitting - Scoot to [place] of Bed: 4: Min assist;With rail Details for Bed Mobility Assistance: Cues for technique and safety Transfers Transfers: Sit to Stand;Stand to Sit Sit to Stand: 4: Min assist;From bed;With upper extremity assist Stand to Sit: 4: Min assist;To bed;With upper extremity assist Details for Transfer Assistance: cues for hand placement, safety, and technique Ambulation/Gait Ambulation/Gait Assistance: 3: Mod assist Ambulation Distance (Feet): 18 Feet Assistive device: Rolling walker Ambulation/Gait Assistance Details: light mod assist, mostly for RW management as pt collided with a few objects (mostly wheel on wrong side of rolling table base); flexed posture; Good use of RW for UE support Gait Pattern: Shuffle    Exercises     PT Diagnosis:    PT Problem List:   PT Treatment Interventions:     PT Goals Acute Rehab PT Goals Time For Goal Achievement: 01/07/12 Potential to Achieve Goals: Good Pt will go Supine/Side to Sit: with modified independence PT Goal: Supine/Side to Sit - Progress:  Progressing toward goal Pt will go Sit to Supine/Side: with modified independence PT Goal: Sit to Supine/Side - Progress: Progressing toward goal Pt will go Sit to Stand: with supervision PT Goal: Sit to Stand - Progress: Progressing toward goal Pt will go Stand to Sit: with supervision PT Goal: Stand to Sit - Progress: Progressing toward goal Pt will Ambulate: >150 feet;with supervision;with rolling walker PT Goal: Ambulate - Progress: Progressing toward goal (slowly)  Visit Information  Last PT Received On: 12/25/11 Assistance Needed: +1    Subjective Data  Subjective: Not feeling as well today, but agreeable to amb Patient Stated Goal: home   Cognition  Overall Cognitive Status: Appears within functional limits for tasks assessed/performed Arousal/Alertness: Awake/alert Orientation Level: Appears intact for tasks assessed Behavior During Session: [facility] for tasks performed    Balance     End of Session PT - End of Session Equipment Utilized During Treatment: Gait belt Activity Tolerance: Patient tolerated treatment well Patient left: in bed;with bed alarm set;with call bell/phone within reach Nurse Communication: Mobility status   GP     Van Clines [facility] [place], [place] [phone]  12/25/2011, 2:15 PM

## 2011-12-25 NOTE — Progress Notes (Signed)
Subjective: She tolerated PO intake well yesterday with no diarrhea until later this morning. Per her daughter's patient may also be lactose intolerant but was given Ensure last night, concern for possible cause of diarrhea today.   She denied headache, chest pain, shortness of breath, abdominal pain, or blood in her stool.  Objective: Vital signs in last 24 hours: Filed Vitals:   12/24/11 1405 12/24/11 1758 12/24/11 2050 12/25/11 0523  BP: 124/66 112/68 132/84 128/75  Pulse: 63 60 71 72  Temp: 98.1 F (36.7 C) 97.4 F (36.3 C) 97.5 F (36.4 C) 97.5 F (36.4 C)  TempSrc: Oral Oral Oral Oral  Resp: 18 18 18 18   Height:      Weight:   141 lb 15.6 oz (64.4 kg)   SpO2: 94% 92% 93% 91%   Weight change: 22 lb 15.6 oz (10.422 kg) Vitals reviewed.  General: resting in bed, in NAD  HEENT: PERRL, no scleral icterus.MMM. Sunken eyes.  Cardiac: RRR, no rubs, murmurs or gallops  Pulm: clear to auscultation bilaterally, no wheezes, rales, or rhonchi  Abd: soft, nontender, more distended than yesterday, tympanic bowel sounds  Ext: warm and well perfused, no pedal edema. DP 1+ bilaterally  Neuro: alert and oriented X3, cranial nerves II-XII grossly intact, decreased bulk, normal tone    Lab Results: Basic Metabolic Panel:  Lab 12/25/11 1610 12/24/11 0600 12/23/11 1545  NA 145 140 --  K 3.9 4.0 --  CL 113* 108 --  CO2 20 20 --  GLUCOSE 127* 165* --  BUN 37* 39* --  CREATININE 1.29* 1.59* --  CALCIUM 8.5 8.7 --  MG -- -- 1.6  PHOS -- -- --   Liver Function Tests:  Lab 12/24/11 0600 12/23/11 1220  AST 11 15  ALT 6 9  ALKPHOS 93 104  BILITOT 0.2* 0.2*  PROT 6.4 7.5  ALBUMIN 2.6* 3.1*    Lab 12/18/11 1453  LIPASE 20  AMYLASE --   CBC:  Lab 12/25/11 0555 12/24/11 0600 12/23/11 1220 12/18/11 1453  WBC 7.7 5.2 -- --  NEUTROABS -- -- 8.1* 5.1  HGB 11.3* 11.7* -- --  HCT 33.3* 35.4* -- --  MCV 87.4 86.8 -- --  PLT 211 197 -- --   Urinalysis:  Lab 12/23/11 1653 12/18/11  1527  COLORURINE YELLOW AMBER*  LABSPEC 1.022 1.022  PHURINE 5.5 5.0  GLUCOSEU NEGATIVE NEGATIVE  HGBUR NEGATIVE NEGATIVE  BILIRUBINUR SMALL* SMALL*  KETONESUR 15* NEGATIVE  PROTEINUR NEGATIVE 30*  UROBILINOGEN 0.2 1.0  NITRITE NEGATIVE NEGATIVE  LEUKOCYTESUR NEGATIVE NEGATIVE    Micro Results: Recent Results (from the past 240 hour(s))  URINE CULTURE     Status: Normal   Collection Time   12/23/11  4:53 PM      Component Value Range Status Comment   Specimen Description URINE, CATHETERIZED   Final    Special Requests NONE   Final    Culture  Setup Time 12/23/2011 17:32   Final    Colony Count 4,000 COLONIES/ML   Final    Culture INSIGNIFICANT GROWTH   Final    Report Status 12/24/2011 FINAL   Final   CLOSTRIDIUM DIFFICILE BY PCR     Status: Normal   Collection Time   12/24/11 12:00 AM      Component Value Range Status Comment   C difficile by pcr NEGATIVE  NEGATIVE Final    Studies/Results: Ct Abdomen Pelvis Wo Contrast  12/23/2011  *RADIOLOGY REPORT*  Clinical Data: Fever, nausea, vomiting and  diarrhea.  CT ABDOMEN AND PELVIS WITHOUT CONTRAST  Technique:  Multidetector CT imaging of the abdomen and pelvis was performed following the standard protocol without intravenous contrast.  Comparison: 12/18/2011.  Findings: There has been no significant change in marked dilatation of the right and transverse colon and lesser degree of dilatation of the distal small bowel.  The left and rectosigmoid colon remain normal in caliber, containing diverticula.  These are chronic findings for this patient.  The kidneys remain somewhat small and associated with multiple cysts and large left perinephric cysts.  Previously demonstrated small areas of low density in the liver and spleen are not well visualized today without intravenous contrast. Unremarkable noncontrasted appearance of the pancreas, adrenal glands, urinary bladder, uterus and ovaries.  The gallbladder is not visualized.  No enlarged  lymph nodes.  The previously demonstrated nodular density at the right lung base is not currently included.  Lumbar spine fixation hardware is noted.  IMPRESSION:  1.  No acute abnormality. 2.  Stable chronic marked dilatation of the right and transverse colon and lesser degree of dilatation of the distal small bowel, compatible with chronic dysmotility.   Original Report Authenticated By: Darrol Angel, M.D.    Medications: I have reviewed the patient's current medications. Scheduled Meds:   . aspirin EC  81 mg Oral Daily  . feeding supplement  237 mL Oral Q24H  . feeding supplement  1 Container Oral TID BM  . heparin subcutaneous  5,000 Units Subcutaneous Q8H  . hydrocortisone sod succinate (SOLU-CORTEF) injection  50 mg Intravenous Q6H  . metoCLOPramide (REGLAN) injection  5 mg Intravenous Q12H  . paricalcitol  1 mcg Oral Q M,W,F  . traZODone  25 mg Oral QHS  . DISCONTD: traZODone  50 mg Oral QHS   Continuous Infusions:   . sodium chloride 100 mL/hr at 12/25/11 0219   PRN Meds:.acetaminophen, acetaminophen, ondansetron (ZOFRAN) IV, ondansetron Assessment/Plan: 78 y.o woman presenting with N/V/D and decreased PO.   1.Diarrhea. Now resolved. This has persisted for over a week. In addition she has had nausea, vomiting that improved with Reglan, and now dehydration with decreased po intake. She was admitted and treated for possible diverticulitis on 7/13 and was given Cipro and Flagyl. This could be C. Diff colitis v. gastroenteritis v diverticulitis v toxic megacolon. She has h/o colon cancer and s/p right hemicolectomy and bowel obstruction (followed by Dr. Loreta Ave but discharged from that practice).  -CT ab pelvis without contrast given creatinine of 1.7 (baseline of 1.3) showing no acute changes from previous CT abdomen with contrast on 8/21  -Ordered O&P, stool gm stain and cultures, - C. Diff stool PCR negative - Ordered FOBT  - Rx Zofran 4 mg q8 prn iv, NS 100 cc/hr,  - Tylenol 650  mg prn  - GI consulted ([facility]). Appreciate recommendations.   2. Dizziness/Lightheadedness. Resolved. Likely 2/2 to decreased po and dehydration. No orthostatics per recorded values but taken after IVF  -hydrating with NS at 151ml/hr, concern for hx pulmonary edema, lungs clear to auscultation today.   3. Acute kidney insuficiency. Improving. Baseline <1.1, Cr 1.7 on admission. Her Hg baseline ~10-11, it was 14 on admission, concern for hemoconcentration. She did have CT abdomen and pelvis with contrast on 8/21, this could be a component of IV dye contrast nephrotoxicity. Creatinine at 1.29 today.  -Pt on NS 100 cc/hr  -monitor BMP   4. Adrenal suppression. Her original home dose was Cortef 10 mg qam and 5 mg  qpm, however, her dose was increased to 20mg  qAM and 10mg  qPM since her discharge from the hospital. She had an appointment with Dr. Sharl Ma, her endocrinologist today as a hospital follow-up. Per Dr. Sharl Ma, will give her stress dose of 50mg  q6hrs with plan to discharge with 20mg  qAM and 10mg  qPM with follow up in 2-3 days after her discharge.  -Continue hydrocortisone 50mg  q6h for now. May decrease tomorrow.   5. Metabolic acidosis. Now resolved. Anion gap of 16 on admission. This is likely multifactorial, could be 2/2 lactic acidosis, explained by her diarrhea >7 days with hypovolemia (hypoperfusion) with AKI (her BUN:Cr 20:1) v. Lactic acidosis.  -IVF at 149ml/hr  -Lactic acid level  -Will check AM CMP.   6. Hx Gluten intolerance  -Gluten free diet only.   7. Insomnia. Per pt and her daughter, this was started about 6 weeks ago, (in place of Zoloft to help her sleep) and has made the pt mildly somnolent during the day. Will decrease dose to 25mg  qHS  -Decreased home Trazadone from 50 to 25 mg qhs   8. Hx MGUS  -pt following Hematology/oncology   9. Hx renal cysts  -pt following [facility]   10. DVT px  -Hep sq    LOS: 2 days   Ky Barban 12/25/2011, 8:23  AM

## 2011-12-26 ENCOUNTER — Inpatient Hospital Stay (HOSPITAL_COMMUNITY): Payer: Medicare Other

## 2011-12-26 LAB — BLOOD GAS, ARTERIAL
Acid-base deficit: 7.8 mmol/L — ABNORMAL HIGH (ref 0.0–2.0)
Bicarbonate: 16.3 mEq/L — ABNORMAL LOW (ref 20.0–24.0)
Patient temperature: 98.6
TCO2: 17.1 mmol/L (ref 0–100)
pCO2 arterial: 28 mmHg — ABNORMAL LOW (ref 35.0–45.0)

## 2011-12-26 LAB — COMPREHENSIVE METABOLIC PANEL
CO2: 16 mEq/L — ABNORMAL LOW (ref 19–32)
Calcium: 8.3 mg/dL — ABNORMAL LOW (ref 8.4–10.5)
Creatinine, Ser: 1.11 mg/dL — ABNORMAL HIGH (ref 0.50–1.10)
GFR calc Af Amer: 51 mL/min — ABNORMAL LOW (ref >90)
GFR calc non Af Amer: 44 mL/min — ABNORMAL LOW (ref >90)
Glucose, Bld: 118 mg/dL — ABNORMAL HIGH (ref 70–99)
Total Protein: 5.6 g/dL — ABNORMAL LOW (ref 6.0–8.3)

## 2011-12-26 LAB — CBC WITH DIFFERENTIAL/PLATELET
Basophils Absolute: 0 10*3/uL (ref 0.0–0.1)
Eosinophils Absolute: 0 10*3/uL (ref 0.0–0.7)
Eosinophils Relative: 0 % (ref 0–5)
Hemoglobin: 11.1 g/dL — ABNORMAL LOW (ref 12.0–15.0)
Lymphocytes Relative: 13 % (ref 12–46)
Lymphs Abs: 0.7 10*3/uL (ref 0.7–4.0)
MCHC: 32.9 g/dL (ref 30.0–36.0)
Monocytes Absolute: 0.2 10*3/uL (ref 0.1–1.0)
Neutro Abs: 4.6 10*3/uL (ref 1.7–7.7)
Neutrophils Relative %: 84 % — ABNORMAL HIGH (ref 43–77)
RBC: 3.74 MIL/uL — ABNORMAL LOW (ref 3.87–5.11)
WBC: 5.5 10*3/uL (ref 4.0–10.5)

## 2011-12-26 MED ORDER — HYDROCORTISONE 20 MG PO TABS
20.0000 mg | ORAL_TABLET | Freq: Every morning | ORAL | Status: DC
  Administered 2011-12-27 – 2011-12-28 (×2): 20 mg via ORAL
  Filled 2011-12-26 (×2): qty 1

## 2011-12-26 MED ORDER — HYDROCORTISONE 10 MG PO TABS
10.0000 mg | ORAL_TABLET | Freq: Every day | ORAL | Status: DC
  Administered 2011-12-26 – 2011-12-27 (×2): 10 mg via ORAL
  Filled 2011-12-26 (×3): qty 1

## 2011-12-26 NOTE — Progress Notes (Addendum)
Internal Medicine Attending  Date: 12/26/2011  Patient name: Rachel Bridges Medical record number: [id number] Date of birth: 31-May-1924 Age: 78 y.o. Gender: female  I saw and evaluated the patient and discussed her care with house staff. I reviewed the resident's note by Dr. Garald Braver and I agree with the resident's findings and plans as documented in her note, with the following additional comments.  Patient's abdomen is somewhat less distended; she denies diarrhea today.  Her bicarbonate today is 16 with a normal anion gap, and her arterial blood gas indicates an appropriately compensated metabolic acidosis.  This may represent bicarbonate loss due to diarrhea (her bicarbonate was 16 on admission), and should correct with resolution of her diarrhea.  Review of her chart shows a similar non-gap acidosis in November of 2012 when she was initially diagnosed with adrenal insufficiency, and her bicarbonate subsequently improved. However, she is now on stress dose adrenal replacement therapy.  GI consult is following, and the assistance from Dr. Randa Evens is appreciated.

## 2011-12-26 NOTE — Progress Notes (Signed)
[facility] PROGRESS NOTE Subjective Patient denies diarrhea nausea. She is unable to tell me if she ate breakfast or not. She states that she does not feel good and no one is helping her but does not appear to have a specific complaint other than her feet   andache. Objective: Vital signs in last 24 hours: Temp:  [97.4 F (36.3 C)-98.7 F (37.1 C)] 97.9 F (36.6 C) (08/29 1001) Pulse Rate:  [60-81] 81  (08/29 1001) Resp:  [18-20] 18  (08/29 1001) BP: (131-147)/(64-80) 132/64 mmHg (08/29 1001) SpO2:  [93 %-97 %] 97 % (08/29 1001) Weight:  [65.2 kg (143 lb 11.8 oz)] 65.2 kg (143 lb 11.8 oz) (08/28 2049) Last BM Date: 12/25/11  Intake/Output from previous day: 08/28 0701 - 08/29 0700 In: 2506 [P.O.:615; I.V.:1890; IV Piggyback:1] Out: -  Intake/Output this shift:    PE: Gen.-alert white female no acute distress Abdomen-nondistended soft and nontender with good bowel sounds.    Lab Results:  Basename 12/26/11 0635 12/25/11 0555 12/24/11 0600  WBC 5.5 7.7 5.2  HGB 11.1* 11.3* 11.7*  HCT 33.7* 33.3* 35.4*  PLT 183 211 197   BMET  Basename 12/26/11 0635 12/25/11 0555 12/24/11 0600  NA 143 145 140  K 3.5 3.9 4.0  CL 116* 113* 108  CO2 16* 20 20  CREATININE 1.11* 1.29* 1.59*   LFT  Basename 12/26/11 0635 12/24/11 0600  PROT 5.6* 6.4  AST 10 11  ALT 7 6  ALKPHOS 75 93  BILITOT 0.1* 0.2*  BILIDIR -- --  IBILI -- --   PT/INR No results found for this basename: LABPROT:3,INR:3 in the last 72 hours PANCREAS No results found for this basename: LIPASE:3 in the last 72 hours       Studies/Results: No results found.  Medications: I have reviewed the patient's current medications.  Assessment/Plan: 1. Nausea vomiting/diarrhea. Patient currently is not complaining of anything regarding this and was eating yesterday. TTG for celiac disease pending. Not clear what if any GI workup needs to be performed at this time. We will follow her with  you.   Ilka Lovick JR,Leelan Rajewski L 12/26/2011, 2:32 PM

## 2011-12-26 NOTE — Progress Notes (Signed)
Subjective: She remains afebrile, not hypothermic either, with no leukocytosis. She has been eating "too much" she reports but has not had diarrhea or N/V today.   She denies chest pain, shortness of breath, dysuria, or increased urinary frequency.  Objective: Vital signs in last 24 hours: Filed Vitals:   12/25/11 1400 12/25/11 1859 12/25/11 2049 12/26/11 0531  BP: 126/75 134/80 131/74 147/77  Pulse: 81 77 61 60  Temp: 98.5 F (36.9 C) 98.7 F (37.1 C) 97.5 F (36.4 C) 97.4 F (36.3 C)  TempSrc: Oral Oral Oral Oral  Resp: 18 18 20 18   Height:      Weight:   143 lb 11.8 oz (65.2 kg)   SpO2: 95% 97% 96% 93%   Weight change: 1 lb 12.2 oz (0.8 kg)  Intake/Output Summary (Last 24 hours) at 12/26/11 0952 Last data filed at 12/26/11 0554  Gross per 24 hour  Intake   2506 ml  Output      0 ml  Net   2506 ml  Vitals reviewed.  General: resting in bed, in NAD  HEENT: PERRL, no scleral icterus. MMM.  Cardiac: RRR, no rubs, murmurs or gallops  Pulm: bibasilar soft lung crackles, no wheezes, rales, or rhonchi  Abd: soft, nontender, remarkably less distended than yesterday, tympanic bowel sounds  Ext: warm and well perfused, no pedal edema. DP 1+ bilaterally  Neuro: alert and oriented X3, cranial nerves II-XII grossly intact, decreased bulk, normal tone    Lab Results: Basic Metabolic Panel:  Lab 12/26/11 1610 12/25/11 0555 12/23/11 1545  NA 143 145 --  K 3.5 3.9 --  CL 116* 113* --  CO2 16* 20 --  GLUCOSE 118* 127* --  BUN 32* 37* --  CREATININE 1.11* 1.29* --  CALCIUM 8.3* 8.5 --  MG -- -- 1.6  PHOS -- -- --   Liver Function Tests:  Lab 12/26/11 0635 12/24/11 0600  AST 10 11  ALT 7 6  ALKPHOS 75 93  BILITOT 0.1* 0.2*  PROT 5.6* 6.4  ALBUMIN 2.3* 2.6*   CBC:  Lab 12/26/11 0635 12/25/11 0555 12/23/11 1220  WBC 5.5 7.7 --  NEUTROABS 4.6 -- 8.1*  HGB 11.1* 11.3* --  HCT 33.7* 33.3* --  MCV 90.1 87.4 --  PLT 183 211 --   Urinalysis:  Lab 12/23/11 1653    COLORURINE YELLOW  LABSPEC 1.022  PHURINE 5.5  GLUCOSEU NEGATIVE  HGBUR NEGATIVE  BILIRUBINUR SMALL*  KETONESUR 15*  PROTEINUR NEGATIVE  UROBILINOGEN 0.2  NITRITE NEGATIVE  LEUKOCYTESUR NEGATIVE    Micro Results: Recent Results (from the past 240 hour(s))  URINE CULTURE     Status: Normal   Collection Time   12/23/11  4:53 PM      Component Value Range Status Comment   Specimen Description URINE, CATHETERIZED   Final    Special Requests NONE   Final    Culture  Setup Time 12/23/2011 17:32   Final    Colony Count 4,000 COLONIES/ML   Final    Culture INSIGNIFICANT GROWTH   Final    Report Status 12/24/2011 FINAL   Final   CLOSTRIDIUM DIFFICILE BY PCR     Status: Normal   Collection Time   12/24/11 12:00 AM      Component Value Range Status Comment   C difficile by pcr NEGATIVE  NEGATIVE Final   STOOL CULTURE     Status: Normal (Preliminary result)   Collection Time   12/24/11 12:01 AM  Component Value Range Status Comment   Specimen Description STOOL   Final    Special Requests NONE   Final    Culture Culture reincubated for better growth   Final    Report Status PENDING   Incomplete    Studies/Results: No results found. Medications: I have reviewed the patient's current medications. Scheduled Meds:   . aspirin EC  81 mg Oral Daily  . feeding supplement  237 mL Oral Q24H  . feeding supplement  1 Container Oral TID BM  . heparin subcutaneous  5,000 Units Subcutaneous Q8H  . hydrocortisone sod succinate (SOLU-CORTEF) injection  50 mg Intravenous Q6H  . metoCLOPramide (REGLAN) injection  5 mg Intravenous Q12H  . paricalcitol  1 mcg Oral Q M,W,F  . traZODone  25 mg Oral QHS   Continuous Infusions:   . sodium chloride 100 mL/hr at 12/25/11 0219   PRN Meds:.acetaminophen, acetaminophen, ondansetron (ZOFRAN) IV, ondansetron Assessment/Plan: 78 y.o woman presenting with N/V/D and decreased PO.   1.Diarrhea. Now resolved. Prior to admission this persisted for over  a week. In addition she had nausea, vomiting that improved with Reglan, and dehydration with decreased po intake. She was admitted and treated for possible diverticulitis on 7/13 and was given Cipro and Flagyl. This could be C. Diff colitis v. gastroenteritis v diverticulitis v toxic megacolon. She has h/o colon cancer and s/p right hemicolectomy and bowel obstruction (followed by Dr. Loreta Ave but discharged from that practice). She had negative bx for celiac disease but elevated TTG ~90 (nl <20) and was placed on a gluten free diet which her daughters report as being a challenge while the pt was at [facility] but full compliance now that the pt is home with 24hr care by family.  -CT ab pelvis without contrast given creatinine of 1.7 (baseline of 1.3) showing no acute changes from previous CT abdomen with contrast on 8/21  - GI consulted. Per Dr. Randa Evens will repeat TGG IgA for questionable celiac disease with negative bx. Surveillance colonoscopy not recommended for this 78 year-old in case of positive FOBT.  - Urine culture with insignificant growth - C. Diff stool PCR negative  - Ordered FOBT   Plan: - f/u on stool culture - Rx Zofran 4 mg q8 prn iv, but pt has not needed this medication since her admission. Will consider discontinuing it if worsening of  prolonged QTc - Continue hydration with NS 100 cc/hr,  - Reglan 5mg  BID, will consider discontinuing this drug based on clinical improvement.  - Tylenol 650 mg prn  - f/u on TTG IgA, pt will need follow up with GI as outpatient for evaluation and further management of likely chronic intestinal pseudo-obstruction.  -CMP in AM  2. Dizziness/Lightheadedness. Resolved. Likely 2/2 to decreased po and dehydration. No orthostatics per recorded values but taken after IVF  -Decreased NS at 167ml/hr to 51ml/h given bibasilar lung crackles today and improved PO intake.   3. Acute kidney insuficiency. Improving. Baseline <1.1, Cr 1.7 on admission. Her Hg baseline  ~10-11, it was 14 on admission, concern for hemoconcentration. She did have CT abdomen and pelvis with contrast on 8/21, this could be a component of IV dye contrast nephrotoxicity. Creatinine at 1.11 today.  -Pt on NS 40 cc/hr  -monitor CMP   4. Adrenal suppression. Her original home dose was Cortef 10 mg qam and 5 mg qpm, however, her dose was increased to 20mg  qAM and 10mg  qPM since her discharge from the hospital. She had an appointment  with Dr. Sharl Ma, her endocrinologist today as a hospital follow-up. Per Dr. Sharl Ma, will give her stress dose of 50mg  q6hrs with plan to discharge with 20mg  qAM and 10mg  qPM with follow up in 2-3 days after her discharge.  -Continue hydrocortisone 50mg  q6h for now.   5. Severe malnutrition. Likely multifactorial with component of gluten and lactose intolerance, gut dysmotility, recurrent diarrhea, possible chronic intestinal pseudo-obstruction, and recent N/V with poor PO intake. Nutritionist following her closely. GI has been consulted, repeat TTG IgA pending. She is hypocalcemic based on serum Ca but this corrects to 9.7 with her albumin of 2.3. Total protein of 5.6 today.  -AM CMP   6. Metabolic acidosis. Bicarb of 16 today, anion gap of 11, she is also hyperchloremic with Cl at 116. She had diarrhea at least twice yesterday and has been infused with NS at 100cc/h, so her normal gap hyperchloremic metabolic acidosis could be a component of bicarb loss in diarrhea with NS infusion. She had anion gap of 16 on admission. This resolved the next day of her admission but was initially thought to be likely multifactorial, could be 2/2 lactic acidosis, explained by her diarrhea >7 days with hypovolemia (hypoperfusion) with AKI (her BUN:Cr 20:1) v. Lactic acidosis. Lactic acid level elevated at 2.6 on 8/28 with no peritoneal signs, likely 2/2 hypoperfusion present on admission 2/2 poor PO intake. Lad findings not c/w lactic acidosis at this time as plasma lactate <4.  -Will  recheck lactic acid level today -IVF decreased to 25ml/hr  -Will check AM CMP.   6. Hx Gluten intolerance  -Gluten free diet only.   7. Insomnia. Per pt and her daughter, this was started about 6 weeks ago, (in place of Zoloft to help her sleep) and has made the pt mildly somnolent during the day. Will decrease dose to 25mg  qHS.  -Decreased home Trazadone from 50 to 25 mg qhs   8. Hx MGUS  -pt following Hematology/oncology   9. Hx renal cysts  -pt following [facility]   10. DVT px  -Hep sq       LOS: 3 days   Ky Barban 12/26/2011, 9:52 AM

## 2011-12-26 NOTE — Progress Notes (Signed)
   CARE MANAGEMENT NOTE 12/26/2011  Patient:  REEVA, DAVERN   Account Number:  [id number]  Date Initiated:  12/25/2011  Documentation initiated by:  SUITS,TERI  Subjective/Objective Assessment:   Pt is an 78 yr old admitted with diarrhea and dehydration  Pt is active with [facility]     Action/Plan:   Continue to follow for CM/discharge planning needs   Anticipated DC Date:  12/27/2011   Anticipated DC Plan:  HOME W HOME HEALTH SERVICES      DC Planning Services  CM consult      Choice offered to / List presented to:             Status of service:  In process, will continue to follow Medicare Important Message given?   (If response is "NO", the following Medicare IM given date fields will be blank) Date Medicare IM given:   Date Additional Medicare IM given:    Discharge Disposition:    Per UR Regulation:  Reviewed for med. necessity/level of care/duration of stay  If discussed at Long Length of Stay Meetings, dates discussed:    Comments:  12/26/2011  1500 Darlyne Russian RN, [place]  [phone] Patient reports [facility] care services RN, PT, OT prior to admission.  [facility]/Debbie Barnes: verified patient is active for home health RN, PT, OT

## 2011-12-27 LAB — OVA AND PARASITE EXAMINATION

## 2011-12-27 LAB — COMPREHENSIVE METABOLIC PANEL
Albumin: 2.4 g/dL — ABNORMAL LOW (ref 3.5–5.2)
Alkaline Phosphatase: 70 U/L (ref 39–117)
BUN: 29 mg/dL — ABNORMAL HIGH (ref 6–23)
Chloride: 115 mEq/L — ABNORMAL HIGH (ref 96–112)
Creatinine, Ser: 1.06 mg/dL (ref 0.50–1.10)
GFR calc Af Amer: 54 mL/min — ABNORMAL LOW (ref >90)
GFR calc non Af Amer: 47 mL/min — ABNORMAL LOW (ref >90)
Glucose, Bld: 89 mg/dL (ref 70–99)
Potassium: 3 mEq/L — ABNORMAL LOW (ref 3.5–5.1)
Total Bilirubin: 0.1 mg/dL — ABNORMAL LOW (ref 0.3–1.2)

## 2011-12-27 LAB — CBC
HCT: 34.2 % — ABNORMAL LOW (ref 36.0–46.0)
Hemoglobin: 11.5 g/dL — ABNORMAL LOW (ref 12.0–15.0)
MCV: 88.6 fL (ref 78.0–100.0)
RDW: 13.9 % (ref 11.5–15.5)
WBC: 7.7 10*3/uL (ref 4.0–10.5)

## 2011-12-27 LAB — TISSUE TRANSGLUTAMINASE, IGA: Tissue Transglutaminase Ab, IgA: 10.7 U/mL (ref <20)

## 2011-12-27 MED ORDER — POTASSIUM CHLORIDE CRYS ER 20 MEQ PO TBCR
30.0000 meq | EXTENDED_RELEASE_TABLET | Freq: Two times a day (BID) | ORAL | Status: AC
  Administered 2011-12-27 (×2): 30 meq via ORAL
  Filled 2011-12-27 (×2): qty 1

## 2011-12-27 MED ORDER — SODIUM CHLORIDE 0.45 % IV SOLN: INTRAVENOUS | Status: DC

## 2011-12-27 MED ORDER — ZOLPIDEM TARTRATE 5 MG PO TABS
2.5000 mg | ORAL_TABLET | Freq: Once | ORAL | Status: AC | PRN
  Administered 2011-12-27: 2.5 mg via ORAL
  Filled 2011-12-27: qty 1

## 2011-12-27 MED ORDER — TRAZODONE HCL 50 MG PO TABS
50.0000 mg | ORAL_TABLET | Freq: Every day | ORAL | Status: DC
  Filled 2011-12-27 (×2): qty 1

## 2011-12-27 NOTE — Progress Notes (Signed)
Subjective: She stated feeling much better today and wanting to go home. Her mood and affect have significantly improved today.   She denies N/V/D, abdominal pain, or blood in her stool.   Objective: Vital signs in last 24 hours: Filed Vitals:   12/26/11 1001 12/26/11 1335 12/26/11 2042 12/27/11 0500  BP: 132/64 112/69 131/78 142/75  Pulse: 81 75 67 65  Temp: 97.9 F (36.6 C) 96.9 F (36.1 C) 98.1 F (36.7 C) 98.1 F (36.7 C)  TempSrc: Oral Oral Oral Oral  Resp: 18 18 18 18   Height:   5\' 4"  (1.626 m)   Weight:   143 lb 11.8 oz (65.2 kg)   SpO2: 97% 97% 94% 97%   Weight change: 0 lb (0 kg)  Intake/Output Summary (Last 24 hours) at 12/27/11 0824 Last data filed at 12/27/11 0609  Gross per 24 hour  Intake 1734.17 ml  Output      0 ml  Net 1734.17 ml  Vitals reviewed.  General: Sitting up in bed, smiling, in NAD  HEENT: PERRL, no scleral icterus. MMM.  Cardiac: RRR, no rubs, murmurs or gallops  Pulm: bibasilar soft lung crackles, no wheezes, rales, or rhonchi  Abd: soft, nontender, remarkably less distended than yesterday, tympanic bowel sounds  Ext: warm and well perfused, no pedal edema. DP 1+ bilaterally  Neuro: alert and oriented X3, cranial nerves II-XII grossly intact, decreased bulk, normal tone    Lab Results: Basic Metabolic Panel:  Lab 12/27/11 1610 12/26/11 0635 12/23/11 1545  NA 146* 143 --  K 3.0* 3.5 --  CL 115* 116* --  CO2 19 16* --  GLUCOSE 89 118* --  BUN 29* 32* --  CREATININE 1.06 1.11* --  CALCIUM 8.0* 8.3* --  MG -- -- 1.6  PHOS -- -- --   Liver Function Tests:  Lab 12/27/11 0615 12/26/11 0635  AST 14 10  ALT 10 7  ALKPHOS 70 75  BILITOT 0.1* 0.1*  PROT 5.5* 5.6*  ALBUMIN 2.4* 2.3*   CBC:  Lab 12/27/11 0615 12/26/11 0635 12/23/11 1220  WBC 7.7 5.5 --  NEUTROABS -- 4.6 8.1*  HGB 11.5* 11.1* --  HCT 34.2* 33.7* --  MCV 88.6 90.1 --  PLT 192 183 --   Urine Drug Screen: Drugs of Abuse  No results found for this basename:  labopia, cocainscrnur, labbenz, amphetmu, thcu, labbarb    Alcohol Level: No results found for this basename: ETH:2 in the last 168 hours Urinalysis:  Lab 12/23/11 1653  COLORURINE YELLOW  LABSPEC 1.022  PHURINE 5.5  GLUCOSEU NEGATIVE  HGBUR NEGATIVE  BILIRUBINUR SMALL*  KETONESUR 15*  PROTEINUR NEGATIVE  UROBILINOGEN 0.2  NITRITE NEGATIVE  LEUKOCYTESUR NEGATIVE     Micro Results: Recent Results (from the past 240 hour(s))  URINE CULTURE     Status: Normal   Collection Time   12/23/11  4:53 PM      Component Value Range Status Comment   Specimen Description URINE, CATHETERIZED   Final    Special Requests NONE   Final    Culture  Setup Time 12/23/2011 17:32   Final    Colony Count 4,000 COLONIES/ML   Final    Culture INSIGNIFICANT GROWTH   Final    Report Status 12/24/2011 FINAL   Final   CLOSTRIDIUM DIFFICILE BY PCR     Status: Normal   Collection Time   12/24/11 12:00 AM      Component Value Range Status Comment   C difficile by pcr  NEGATIVE  NEGATIVE Final   STOOL CULTURE     Status: Normal (Preliminary result)   Collection Time   12/24/11 12:01 AM      Component Value Range Status Comment   Specimen Description STOOL   Final    Special Requests NONE   Final    Culture NO SUSPICIOUS COLONIES, CONTINUING TO HOLD   Final    Report Status PENDING   Incomplete    Studies/Results: Dg Chest 2 View  12/26/2011  *RADIOLOGY REPORT*  Clinical Data: Abnormal breath sounds  CHEST - 2 VIEW  Comparison: 08/14/2011  Findings: Cardiomegaly.  Mild perihilar interstitial prominence suggesting edema or chronic interstitial fibrosis.  No focal infiltrates or overt failure.  Normal osseous structures. Prominent colonic distention consistent with history of pseudo- obstruction, slight increase from priors.  IMPRESSION: Stable chest with cardiomegaly. Prominent colonic distention.  No free air.   Original Report Authenticated By: Elsie Stain, M.D.    Medications: I have reviewed the  patient's current medications. Scheduled Meds:   . aspirin EC  81 mg Oral Daily  . feeding supplement  237 mL Oral Q24H  . feeding supplement  1 Container Oral TID BM  . heparin subcutaneous  5,000 Units Subcutaneous Q8H  . hydrocortisone  20 mg Oral q morning - 10a   And  . hydrocortisone  10 mg Oral QHS  . paricalcitol  1 mcg Oral Q M,W,F  . traZODone  25 mg Oral QHS  . DISCONTD: hydrocortisone sod succinate (SOLU-CORTEF) injection  50 mg Intravenous Q6H  . DISCONTD: metoCLOPramide (REGLAN) injection  5 mg Intravenous Q12H   Continuous Infusions:   . sodium chloride 1,000 mL (12/26/11 1356)   PRN Meds:.acetaminophen, acetaminophen, ondansetron (ZOFRAN) IV, ondansetron Assessment/Plan: 78 y.o woman presenting with N/V/D and decreased PO.   1.Diarrhea. Now resolved. Prior to admission this persisted for over a week. In addition she had nausea, vomiting that improved with Reglan, and dehydration with decreased po intake. She was admitted and treated for possible diverticulitis on 7/13 and was given Cipro and Flagyl. This could be C. Diff colitis v. gastroenteritis v diverticulitis v toxic megacolon. She has h/o colon cancer and s/p right hemicolectomy and bowel obstruction (followed by Dr. Loreta Ave but discharged from that practice). She had negative bx for celiac disease but elevated TTG ~90 (nl <20) and was placed on a gluten free diet which her daughters report as being a challenge while the pt was at [facility] but full compliance now that the pt is home with 24hr care by family.  -CT ab pelvis without contrast given creatinine of 1.7 (baseline of 1.3) showing no acute changes from previous CT abdomen with contrast on 8/21  - GI consulted. Per Dr. Randa Evens will repeat TGG IgA for questionable celiac disease with negative bx. Surveillance colonoscopy not recommended for this 78 year-old in case of positive FOBT.  - Urine culture with insignificant growth  - C. Diff stool PCR negative  - Ordered  FOBT  -CXR on 8/29 showing prominent colonic distention but no free air.  Plan:  - f/u on stool culture  - Rx Zofran 4 mg q8 prn iv, but pt has not needed this medication since her admission. Will consider discontinuing it if worsening of prolonged QTc  - Discontinued hydration with NS 100 cc/hr, given hx of pulmonary edema on prior admissions and mild hypernatremia/hyperchloremia.   - Discontinued Reglan 5mg  BID, yesterday based on clinical improvement. Pt with improved affect today.  - Tylenol  650 mg prn  - f/u on TTG IgA, pt will need follow up with GI as outpatient for evaluation and further management of likely chronic intestinal pseudo-obstruction.  -Pt's plasma lactic acid elevated at 2.6-->3.5--> down today to 2.2. Ordered MRA (without contrast) for evaluation of chronic bowel ischemia.  -CMP in AM   2. Dizziness/Lightheadedness. Resolved. Likely 2/2 to decreased po and dehydration. No orthostatics per recorded values but taken after IVF  -on 8/29. Decreased NS at 184ml/hr to 76ml/h given bibasilar lung crackles today and improved PO intake.  -Discontinued IVF today based on bibasilar lung crackles and hypernatremia/hyperchloremia, and hx of pulmonary edema with IVF.   3. Acute kidney insuficiency. Improving. Baseline <1.1, Cr 1.7 on admission. Her Hg baseline ~10-11, it was 14 on admission, concern for hemoconcentration. She did have CT abdomen and pelvis with contrast on 8/21, this could be a component of IV dye contrast nephrotoxicity. Creatinine at 1.06 today, although BUN still elevated at 29. Pt tolerating PO intake well today.  -Discontinued IVF per above.  -monitor CMP   4. Adrenal suppression. Her original home dose was Cortef 10 mg qam and 5 mg qpm, however, her dose was increased to 20mg  qAM and 10mg  qPM since her discharge from the hospital. She had an appointment with Dr. Sharl Ma, her endocrinologist today as a hospital follow-up. Per Dr. Sharl Ma, will give her stress dose of 50mg   q6hrs with plan to discharge with 20mg  qAM and 10mg  qPM with follow up in 2-3 days after her discharge.  -DIscontinued hydrocortisone 50mg  q6h on 8/29.  -Started Cortef 20mg  in AM and 10mg  in PM on 8/29  5. Severe malnutrition. Likely multifactorial with component of gluten and lactose intolerance, gut dysmotility, recurrent diarrhea, possible chronic intestinal pseudo-obstruction, and recent N/V with poor PO intake. Nutritionist following her closely. GI has been consulted, repeat TTG IgA pending. She is hypocalcemic based on serum Ca but this corrects to 9.7 with her albumin of 2.3. Total protein of 5.5 today.  -AM CMP  -Gluten free, lactose free nutritional supplementation per nutritionist.   6. Metabolic acidosis. Resolved today. Bicarb of 16 on 8/29, anion gap of 11, she was also hyperchloremic with Cl at 116. She had diarrhea at least twice on 8/28 and had been infused with NS at 100cc/h, so her normal gap hyperchloremic metabolic acidosis could be a component of bicarb loss in diarrhea with NS infusion. She had anion gap of 16 on admission. This resolved the next day of her admission but was initially thought to be likely multifactorial, could be 2/2 lactic acidosis, explained by her diarrhea >7 days with hypovolemia (hypoperfusion) with AKI (her BUN:Cr 20:1 on admission) v. Lactic acidosis. Lactic acid level elevated at 2.6 on 8/28 with no peritoneal signs, likely 2/2 hypoperfusion present on admission 2/2 poor PO intake. Lab findings not c/w lactic acidosis at this time as plasma lactate <4.  -Lactic acid level trended down today  -Discontinued IVF  -Ordered MRA without contrast for evaluation of chronic bowel ischemia.  -Will check AM CMP.   6. Hx Gluten intolerance  -Gluten free diet only.   7. Insomnia. Per pt and her daughter, this was started about 6 weeks ago, (in place of Zoloft to help her sleep) and has made the pt mildly somnolent during the day. Will decrease dose to 25mg  qHS.    -Decreased home Trazadone from 50 to 25 mg qhs on 8/27 but patient has refused this medication since 8/28.   8. Hx MGUS  -  pt following Hematology/oncology   9. Hx renal cysts  -pt following [facility]   10. DVT px  -Hep sq    LOS: 4 days   Ky Barban 12/27/2011, 8:24 AM

## 2011-12-27 NOTE — Progress Notes (Signed)
Physical Therapy Treatment Patient Details Name: Rachel Bridges MRN: [id number] DOB: 1924-08-22 Today's Date: 12/27/2011 Time: 6440-3474 PT Time Calculation (min): 38 min  PT Assessment / Plan / Recommendation Comments on Treatment Session  Slow progress continues.  Today patient very lethargic due to lack of sleep last pm.      Follow Up Recommendations  Home health PT;Supervision/Assistance - 24 hour    Barriers to Discharge        Equipment Recommendations  None recommended by PT    Recommendations for Other Services    Frequency Min 3X/week   Plan Discharge plan remains appropriate    Precautions / Restrictions Precautions Precautions: Fall Restrictions Weight Bearing Restrictions: No       Mobility  Bed Mobility Bed Mobility: Rolling Right;Rolling Left;Right Sidelying to Sit;Sitting - Scoot to Edge of Bed Rolling Right: 4: Min assist;With rail Rolling Left: 4: Min assist;With rail Right Sidelying to Sit: 3: Mod assist;HOB flat;With rails Sitting - Scoot to Edge of Bed: 4: Min assist;With rail Details for Bed Mobility Assistance: Cues for technique and safety Transfers Transfers: Sit to Stand;Stand to Sit Sit to Stand: 4: Min assist;With upper extremity assist;From bed;From toilet Stand to Sit: 4: Min assist;With upper extremity assist;With armrests;To toilet;To chair/3-in-1 Details for Transfer Assistance: Verbal cues for hand placement.  Physical assist to initiate standing from toilet. Ambulation/Gait Ambulation/Gait Assistance: 3: Mod assist Ambulation Distance (Feet): 35 Feet (15' and 20' with rest between) Assistive device: Rolling walker Ambulation/Gait Assistance Details: Patient required mod assist to maneuver RW safely.  Verbal cues to try to stand upright and to keep feet within RW. Gait Pattern: Step-through pattern;Decreased stride length;Shuffle;Trunk flexed Gait velocity: Slow gait speed     PT Goals Acute Rehab PT Goals PT Goal: Supine/Side to  Sit - Progress: Progressing toward goal PT Goal: Sit to Stand - Progress: Progressing toward goal PT Goal: Stand to Sit - Progress: Progressing toward goal PT Goal: Ambulate - Progress: Progressing toward goal  Visit Information  Last PT Received On: 12/27/11 Assistance Needed: +1    Subjective Data  Subjective: I need to go to the bathroom   Cognition  Overall Cognitive Status: Appears within functional limits for tasks assessed/performed Arousal/Alertness: Lethargic Orientation Level: Appears intact for tasks assessed Behavior During Session: Lethargic Cognition - Other Comments: Patient did not sleep well last pm per tech.      Balance     End of Session PT - End of Session Equipment Utilized During Treatment: Gait belt Activity Tolerance: Patient limited by fatigue Patient left: in chair;with call bell/phone within reach;with family/visitor present Nurse Communication: Mobility status (To check on patient frequently - lethargic)   GP     Vena Austria 12/27/2011, 2:52 PM Durenda Hurt. Renaldo Fiddler, [facility] Acute Rehab Services Pager [phone]

## 2011-12-27 NOTE — Progress Notes (Signed)
[facility] PROGRESS NOTE Subjective Patient states that she feels fine this morning and asked me if I am bringing her breakfast. She denies diarrhea or abdominal pain.  Objective: Vital signs in last 24 hours: Temp:  [96.9 F (36.1 C)-98.1 F (36.7 C)] 98.1 F (36.7 C) (08/30 0500) Pulse Rate:  [65-81] 65  (08/30 0500) Resp:  [18] 18  (08/30 0500) BP: (112-142)/(64-78) 142/75 mmHg (08/30 0500) SpO2:  [94 %-97 %] 97 % (08/30 0500) Weight:  [65.2 kg (143 lb 11.8 oz)] 65.2 kg (143 lb 11.8 oz) (08/29 2042) Last BM Date: 12/25/11  Intake/Output from previous day: 08/29 0701 - 08/30 0700 In: 1734.2 [P.O.:120; I.V.:1614.2] Out: -  Intake/Output this shift:    PE: Gen.-alert oriented in no distress Abdomen-soft and nontender  Lab Results:  Basename 12/27/11 0615 12/26/11 0635 12/25/11 0555  WBC 7.7 5.5 7.7  HGB 11.5* 11.1* 11.3*  HCT 34.2* 33.7* 33.3*  PLT 192 183 211   BMET  Basename 12/27/11 0615 12/26/11 0635 12/25/11 0555  NA 146* 143 145  K 3.0* 3.5 3.9  CL 115* 116* 113*  CO2 19 16* 20  CREATININE 1.06 1.11* 1.29*   LFT  Basename 12/27/11 0615 12/26/11 0635  PROT 5.5* 5.6*  AST 14 10  ALT 10 7  ALKPHOS 70 75  BILITOT 0.1* 0.1*  BILIDIR -- --  IBILI -- --   PT/INR No results found for this basename: LABPROT:3,INR:3 in the last 72 hours PANCREAS No results found for this basename: LIPASE:3 in the last 72 hours       Studies/Results: Dg Chest 2 View  12/26/2011  *RADIOLOGY REPORT*  Clinical Data: Abnormal breath sounds  CHEST - 2 VIEW  Comparison: 08/14/2011  Findings: Cardiomegaly.  Mild perihilar interstitial prominence suggesting edema or chronic interstitial fibrosis.  No focal infiltrates or overt failure.  Normal osseous structures. Prominent colonic distention consistent with history of pseudo- obstruction, slight increase from priors.  IMPRESSION: Stable chest with cardiomegaly. Prominent colonic distention.  No free air.   Original  Report Authenticated By: Elsie Stain, M.D.     Medications: I have reviewed the patient's current medications.  Assessment/Plan: 1. Nausea vomiting and diarrhea. Patient currently appears to be asymptomatic although to her history is always somewhat questionable. Only one stool recorded yesterday. She is on a gluten-free diet because of a history of markedly elevated TTG an equivocal small bowel biopsies in the past. Her TTG it was drawn here has not returned. One explanation for her improvement in the hospital could be increased steroids for her adrenal insufficiency as well as adherence to gluten-free diet. My partner will check her this weekend and check the results of the TTG drawn 2 days ago.   Dhiya Smits JR,Dashonda Bonneau L 12/27/2011, 7:22 AM

## 2011-12-27 NOTE — Progress Notes (Signed)
Internal Medicine Attending  Date: 12/27/2011  Patient name: Rachel Bridges Medical record number: [id number] Date of birth: [birth year] Age: 78 y.o. Gender: female  I saw and evaluated the patient, and discussed her care on a.m. rounds with house staff; see the note by resident Dr. Garald Braver for details of clinical findings and plans. Patient is doing well, without acute complaint; she denies abdominal pain, and her abdominal exam shows less distention and no tenderness.  Agree with plan for MRA of the abdomen without contrast to look for evidence of chronic mesenteric ischemia as a cause of her chronic recurrent episodes of abdominal pain, nausea, vomiting, and diarrhea.

## 2011-12-28 ENCOUNTER — Encounter (HOSPITAL_COMMUNITY): Payer: Self-pay | Admitting: General Practice

## 2011-12-28 ENCOUNTER — Inpatient Hospital Stay (HOSPITAL_COMMUNITY): Payer: Medicare Other

## 2011-12-28 LAB — STOOL CULTURE

## 2011-12-28 LAB — COMPREHENSIVE METABOLIC PANEL
Albumin: 2.5 g/dL — ABNORMAL LOW (ref 3.5–5.2)
BUN: 25 mg/dL — ABNORMAL HIGH (ref 6–23)
CO2: 20 mEq/L (ref 19–32)
Chloride: 114 mEq/L — ABNORMAL HIGH (ref 96–112)
Creatinine, Ser: 1.08 mg/dL (ref 0.50–1.10)
GFR calc non Af Amer: 46 mL/min — ABNORMAL LOW (ref >90)
Total Bilirubin: 0.1 mg/dL — ABNORMAL LOW (ref 0.3–1.2)

## 2011-12-28 LAB — CBC
HCT: 35.1 % — ABNORMAL LOW (ref 36.0–46.0)
MCH: 28.9 pg (ref 26.0–34.0)
MCV: 87.5 fL (ref 78.0–100.0)
RDW: 13.7 % (ref 11.5–15.5)
WBC: 7.3 10*3/uL (ref 4.0–10.5)

## 2011-12-28 MED ORDER — GADOBENATE DIMEGLUMINE 529 MG/ML IV SOLN
15.0000 mL | Freq: Once | INTRAVENOUS | Status: AC | PRN
  Administered 2011-12-28: 15 mL via INTRAVENOUS

## 2011-12-28 MED ORDER — ENSURE COMPLETE PO LIQD: 237.0000 mL | ORAL | Status: DC

## 2011-12-28 MED ORDER — SERTRALINE HCL 25 MG PO TABS: 12.5000 mg | ORAL_TABLET | Freq: Every day | ORAL | Status: DC

## 2011-12-28 MED ORDER — HALOPERIDOL LACTATE 5 MG/ML IJ SOLN
0.5000 mg | Freq: Once | INTRAMUSCULAR | Status: AC
Start: 2011-12-28 — End: 2011-12-28
  Administered 2011-12-28: 0.5 mg via INTRAVENOUS
  Filled 2011-12-28: qty 1

## 2011-12-28 MED ORDER — ENSURE PUDDING PO PUDG: 1.0000 | Freq: Three times a day (TID) | ORAL | Status: DC

## 2011-12-28 NOTE — Progress Notes (Signed)
Patient currently in MRI and I had a long conversation with my partner Dr. Randa Evens as well as the patient's daughter who says she needs her Zoloft back and that her trazodone and Ambien seem to work against her and as long as she is on a full liquid diet she does okay and the symptoms have been going on since her surgery in 2000 and it sounds like she may be ready for outpatient followup if the MR is okay and my partner Dr. Randa Evens is happy to see her back or she can return to Dr. Loreta Ave who has known her for years and please call me this weekend if I can be of any further assistance

## 2011-12-28 NOTE — Discharge Summary (Signed)
Internal Medicine Teaching [facility] Discharge Note  Name: Rachel Bridges MRN: [id number] DOB: [birth year] 78 y.o.  Date of Admission: 12/23/2011  1:09 PM Date of Discharge: 12/28/2011 Attending Physician: Farley Ly, MD  Discharge Diagnosis: Principal Problem:  *Diarrhea Active Problems:  DEPRESSION  Acute on chronic renal failure  Gluten intolerance  Lightheadedness  Volume depletion  Severe malnutrition   Discharge Medications: Medication List  As of 12/28/2011  3:39 PM   STOP taking these medications         metoCLOPramide 10 MG tablet      ondansetron 4 MG tablet      traZODone 50 MG tablet         TAKE these medications         aspirin 81 MG tablet   Take 81 mg by mouth daily.      calcium citrate-vitamin D 315-200 MG-UNIT per tablet   Commonly known as: CITRACAL+D   Take 1 tablet by mouth 2 (two) times daily.      cyanocobalamin 1000 MCG/ML injection   Commonly known as: (VITAMIN B-12)   Inject 1,000 mcg into the muscle every 30 (thirty) days.      famotidine 40 MG tablet   Commonly known as: PEPCID   Take 40 mg by mouth Daily.      feeding supplement Liqd   Take 237 mLs by mouth daily.      feeding supplement Pudg   Take 1 Container by mouth 3 (three) times daily between meals.      ferrous fumarate 325 (106 FE) MG Tabs   Commonly known as: HEMOCYTE - 106 mg FE   Take 1 tablet by mouth 3 (three) times daily.      furosemide 20 MG tablet   Commonly known as: LASIX   Take 20 mg by mouth daily.      hydrocortisone 10 MG tablet   Commonly known as: CORTEF   Take 5-20 mg by mouth 2 (two) times daily. 20 mg in a.m. And 5 mg at 5 p.m.      potassium chloride 10 MEQ tablet   Commonly known as: K-DUR   Take 20 mEq by mouth 2 (two) times daily.      sennosides-docusate sodium 8.6-50 MG tablet   Commonly known as: SENOKOT-S   Take 1 tablet by mouth 2 (two) times daily.      sertraline 25 MG tablet   Commonly known as: ZOLOFT   Take  0.5 tablets (12.5 mg total) by mouth daily.      ZEMPLAR 1 MCG capsule   Generic drug: paricalcitol   Take 1 mcg by mouth every Monday, Wednesday, and Friday.            Disposition and follow-up:   Ms.Ortha T Volkert was discharged from [facility] in [place] condition.  At the PCP hospital follow up visit please address:  Diarrhea: This had resolved at the time of her discharge but appears to be recurrent for her especially when she does not follow a gluten free diet. Please assure that the patient is compliant to a gluten free diet.  AKI. Her creatinine was at baseline upon her discharge but her BUN was mildly elevated. Would recheck BMP.  PT/OT, hospital bed needs and assistance. She had a hospital bed delivered to her house on the day of admission. Her family was instructed to contact PT/OT to resume care.  Depression: She was started on a very small dose of  Zoloft. There are concerns for Alzheimer's dementia, geriatric dose titrated for possible Alzheimer's dementia-related depression at very low dose of Zoloft 12.5mg /day with increase in dose to 25mg  in 1-2 weeks (maxium dose of 150-200mg /day) Metabolic acidosis. Had resolved on day of discharge but she was mildly hyperchloremic likely 2/2 IVF. Please recheck BMET. Adrenal suppression: Her family was instructed to contact Dr. Daune Perch office on Tuesday, 12/31/11. Dr. Sharl Ma had requested to see her shortly after her discharge and instructed her to continue taking Cortef 20mg  in AM and 10mg  in PM until she saw him in clinic. She was discharged on Labor Day weekend and an appointment could not be arranged at the time of her discharge. Please assure that she has been seen by Dr. Sharl Ma.   Follow-up Appointments: Follow-up Information    Follow up with [facility]. [facility] RN, Physical Therapy and Occupational Therapy)    Contact information:   [phone]      Follow up with Lars Mage, MD.   Contact information:    [address] [place] [address] [phone]       Follow up with Talmage Coin, MD. (Call on Tuesday to be seen. )    Contact information:   [address] [facility] [place] [address] [phone]       Follow up with [facility] E, MD. (Call to make follow up appointment)    Contact information:   [address], [address] [employer], [place]. [place] [address] [phone]         Discharge Orders    Future Appointments: Provider: Department: Dept Phone: Center:   02/07/2012 12:00 PM Beverely Pace Sh[facility]ate [facility] [phone] None   02/12/2012 10:00 AM Laurice Record, MD [facility] [phone] None     Future Orders Please Complete By Expires   Increase activity slowly      Call MD for:  persistant nausea and vomiting      Call MD for:  persistant dizziness or light-headedness         Consultations: Treatment Team:  Vertell Novak., MD  Procedures Performed:  Ct Abdomen Pelvis Wo Contrast  12/23/2011  *RADIOLOGY REPORT*  Clinical Data: Fever, nausea, vomiting and diarrhea.  CT ABDOMEN AND PELVIS WITHOUT CONTRAST  Technique:  Multidetector CT imaging of the abdomen and pelvis was performed following the standard protocol without intravenous contrast.  Comparison: 12/18/2011.  Findings: There has been no significant change in marked dilatation of the right and transverse colon and lesser degree of dilatation of the distal small bowel.  The left and rectosigmoid colon remain normal in caliber, containing diverticula.  These are chronic findings for this patient.  The kidneys remain somewhat small and associated with multiple cysts and large left perinephric cysts.  Previously demonstrated small areas of low density in the liver and spleen are not well visualized today without intravenous contrast. Unremarkable noncontrasted appearance of the pancreas,  adrenal glands, urinary bladder, uterus and ovaries.  The gallbladder is not visualized.  No enlarged lymph nodes.  The previously demonstrated nodular density at the right lung base is not currently included.  Lumbar spine fixation hardware is noted.  IMPRESSION:  1.  No acute abnormality. 2.  Stable chronic marked dilatation of the right and transverse colon and lesser degree of dilatation of the distal small bowel, compatible with chronic dysmotility.   Original Report Authenticated By: Darrol Angel, M.D.    Dg  Chest 2 View  12/26/2011  *RADIOLOGY REPORT*  Clinical Data: Abnormal breath sounds  CHEST - 2 VIEW  Comparison: 08/14/2011  Findings: Cardiomegaly.  Mild perihilar interstitial prominence suggesting edema or chronic interstitial fibrosis.  No focal infiltrates or overt failure.  Normal osseous structures. Prominent colonic distention consistent with history of pseudo- obstruction, slight increase from priors.  IMPRESSION: Stable chest with cardiomegaly. Prominent colonic distention.  No free air.   Original Report Authenticated By: Elsie Stain, M.D.    Ct Abdomen Pelvis W Contrast  12/18/2011  *RADIOLOGY REPORT*  Clinical Data: Abdominal pain, diarrhea and distension.  History of colon cancer.  CT ABDOMEN AND PELVIS WITH CONTRAST  Technique:  Multidetector CT imaging of the abdomen and pelvis was performed following the standard protocol during bolus administration of intravenous contrast.  Contrast: 80mL OMNIPAQUE IOHEXOL 300 MG/ML  SOLN  Comparison: Radiographs 12/18/2011 and 11/19/2011.  CT 11/16/2011.  Findings: Again demonstrated is diffuse bowel distension, primarily involving the proximal to mid colon and the distal small bowel. The distal colon remains relatively decompressed.  There is no bowel wall thickening, pneumatosis or extraluminal fluid collection.  Some layering fluid is present throughout the colon.  A small nodular density anteriorly at the right lung base on image #4  appears to be associated with the minor fissure on the reformatted images and appears unchanged.  Several low density hepatic lesions are grossly unchanged. Likewise, there are small splenic lesions.  No new lesions are seen to suggest metastatic disease.  There is no adrenal mass.  The pancreas appears normal.  Multiple bilateral renal and left perinephric cysts are again noted. Multiple prominent mesenteric lymph nodes are unchanged. There is no retroperitoneal adenopathy.  There is no ascites.  The uterus and ovaries appear normal.  The bladder appears normal. There are multiple pelvic phleboliths.  Postsurgical changes in the lower lumbar spine are stable.  No acute osseous findings are seen.  IMPRESSION:  1.  Little change is seen compared with prior studies.  There is persistent diffuse bowel distension, primarily of the proximal colon. The chronicity is most consistent with chronic dysmotility. Correlate clinically. 2.  Stable low density hepatic and splenic lesions.  Stable bilateral renal and left perinephric cysts. 3.  No evidence of metastatic disease.   Original Report Authenticated By: Gerrianne Scale, M.D.    Mr [facility] Abdomen W Wo Contrast  12/28/2011  *RADIOLOGY REPORT*  Clinical Data:  Chronic abdominal pain, nausea, vomiting, diarrhea.  MRA ABDOMEN WITH AND WITHOUT CONTRAST  Technique:  Angiographic images of the abdomen were obtained using MRA technique with intravenous contrast.  Contrast:  15mL MULTIHANCE GADOBENATE DIMEGLUMINE 529 MG/ML IV SOLN  Comparison:  CT 12/23/2011 and earlier studies  Arterial findings: Aorta:                  Mild atheromatous irregularity without dissection, aneurysm, or stenosis.  Celiac axis:            Patent  Superior mesenteric:Patent  Left renal:             Single, patent.  Right renal:            Single, relatively diminutive, grossly patent.  Inferior mesenteric:Patent  Left iliac:             Common and visualized portions of internal and external iliac  arteries are mildly tortuous without stenosis or aneurysm.  Right iliac:  Patent  Venous findings:  Patent portal vein, superior mesenteric vein, splenic vein, bilateral renal veins, IVC.   Review of the MIP images confirms the above findings.  Nonvascular findings: Multiple bilateral renal cysts.  Unremarkable limited evaluation of the liver, spleen, pancreas, adrenal glands. Lumbar fixation hardware noted.  Gaseous distention of visualized portions of small bowel and colon.  IMPRESSION:  1.  Mildly atheromatous abdominal aorta without significant visceral arterial stenosis or occlusion to suggest any etiology of occlusive mesenteric ischemia.   Original Report Authenticated By: Osa Craver, M.D.    Dg Abd Acute W/chest  12/18/2011  *RADIOLOGY REPORT*  Clinical Data: Abdominal pain with nausea.  ACUTE ABDOMEN SERIES (ABDOMEN 2 VIEW & CHEST 1 VIEW)  Comparison: 11/19/2011 and chest radiograph 08/14/2011.  Findings: Frontal view of the chest shows midline trachea and stable heart size.  Lungs are clear.  No pleural fluid.  Two views of the abdomen show gaseous distention of small bowel and colon, with associated air fluid levels seen predominantly in the colon.  Gas is seen in the rectum.  IMPRESSION: Diffuse gaseous distention of small bowel and colon with air-fluid levels in the colon, very similar to 11/19/2011.   Original Report Authenticated By: Reyes Ivan, M.D.     Admission HPI: Ms. Horsford is an 78 year-old woman with PMH significant for adrenal insufficiency (empty sella syndrome), monoclonal gammopathy, vitamin B12 deficiency, colon cancer (s/p hemicolectomy, no chemo or radiation, in 2000, Dr. Loreta Ave follows), who presented today at the [facility] for evalutation of N/V/D, fever with Tmax of 101 F on Saturday, and was found to be dehydrated with persistent diarrhea and poor PO intake. According to her daughter these symptoms started about 9 days ago. She was seen in the ED on 8/21  with labs and CT abdomen unremarkable for acute changes, including diverticulitis. She was discharged home with Reglan q6h and has had improvement of her vomiting but continued nausea and decreased intake by mouth. In addition, today, she has been experiencing dizziness with standing, increased weakness, and confusion. She had a fall at home on Tuesday, 8/20 but without head injury. She takes hydrocortisone 20mg  in the AM and 10mg  in the PM for adrenal insufficiency but she has struggled to keep this down secondary to nausea and vomiting.  Of note, she was discharged from the hospital on 11/22/11 for similar symptoms and was given Cipro and Flagyl for presumed diverticulitis.  She denies headache, abdominal pain, or blood in her stool.    Hospital Course by problem list: 1.Diarrhea. Now resolved. Prior to admission this persisted for over a week. In addition she had nausea, vomiting that improved with Reglan, and dehydration with decreased po intake. She was admitted and treated for possible diverticulitis on 7/13 and was given Cipro and Flagyl. This could be C. Diff colitis v. gastroenteritis v diverticulitis v toxic megacolon. She has h/o colon cancer and s/p right hemicolectomy and bowel obstruction (followed by Dr. Loreta Ave but discharged from that practice). She had negative bx for celiac disease but elevated TTG ~90 (nl <20) and was placed on a gluten free diet which her daughters report as being a challenge while the pt was at [facility] but full compliance now that the pt is home with 24hr care by family. CT ab pelvis without contrast given creatinine of 1.7 (baseline of 1.3) showing no acute changes from previous CT abdomen with contrast on 8/21. GI consulted. Per Dr. Malon Kindle, repeat TGG IgA for questionable celiac  disease with negative bx. Result pending on discharge. Her diarrhea recurred with consumption on non-gluten free food but subsided with a gluten-free diet.   Surveillance colonoscopy not  recommended for this 78 year-old in case of positive FOBT. Urine culture with insignificant growth.  C. Diff stool PCR negative. CXR on 8/29 showed prominent colonic distention but no free air.Stool culture negative per prelim report. Stool with no O&P. Pt's plasma lactic acid elevated at 2.6-->3.5--> down to 2.2 on 8/20. Ordered MRA for evaluation of chronic bowel ischemia. MRA per above, showed mildly atheromatous abdominal aorta without significant visceral arterial stenosis or occlusion to suggest any etiology of occlusive mesenteric ischemia. Per GI, Dr. Ewing Schlein, given negative MRA, pt stable to be discharge home with outpatient follow up.   Of note, she had Zofran 4 mg q8 prn iv, but did not need this medication during this admission. We discontinued hydration on 8/30 given hx of pulmonary edema on prior admissions and mild hypernatremia/hyperchloremia. We also discontinued Reglan 5mg  BID, on 8/29 based on clinical improvement. She had remarkable improved affect after Reglan was discontinued.   2. Dizziness/Lightheadedness. Resolved prior to dicharge. Likely 2/2 to decreased po and dehydration. No orthostatics per recorded values but taken after IVF. On 8/29 we decreased NS at 146ml/hr to 43ml/h given bibasilar lung crackles and improved PO intake. On 8/30 IVF were discontinued based on bibasilar lung crackles and ypernatremia/hyperchloremia, and hx of pulmonary edema with IVF.   3. Acute kidney insuficiency. Improving. Baseline <1.1, Cr 1.7 on admission. Her Hg baseline ~10-11, it was 14 on admission, concern for hemoconcentration. She did have CT abdomen and pelvis with contrast on 8/21, this was thought to be a component of IV dye contrast nephrotoxicity. Creatinine at 1.08 today, although BUN still elevated at 25 today but has trended down. Pt tolerating PO intake well today and is being discharged home with instruction for follow up at the [facility] for continued monitoring.    4. Adrenal suppression. Her  original home dose was Cortef 10 mg qam and 5 mg qpm, however, her dose was increased to 20mg  qAM and 10mg  qPM since her discharge from the hospital. She had an appointment with Dr. Sharl Ma, her endocrinologist prior to her admission for a hospital follow-up. Per Dr. Sharl Ma, we gave her stress dose of 50mg  q6hrs until 8/30. We are discharging her homw with 20mg  qAM and 10mg  qPM and instructions to follow up in 2-3 days with Dr. Sharl Ma after her discharge.   5. Severe malnutrition. Likely multifactorial with component of gluten and lactose intolerance, gut dysmotility, recurrent diarrhea, possible chronic intestinal pseudo-obstruction, and recent N/V with poor PO intake. Nutritionist followed her closely. GI has been consulted, repeat TTG IgA pending on discharge. She is hypocalcemic based on serum Ca but this corrects to ~10 with her albumin of 2.5. Total protein of 5.8 today. Gluten free, lactose free nutritional supplementation per nutritionist.   6. Metabolic acidosis. Resolved on 8/30. Bicarb of 16 on 8/29, anion gap of 11, she was also hyperchloremic with Cl at 116. She had diarrhea at least twice on 8/28 and had been infused with NS at 100cc/h, so her normal gap hyperchloremic metabolic acidosis could be a component of bicarb loss in diarrhea with NS infusion. She had anion gap of 16 on admission. This resolved the next day of her admission but was initially thought to be likely multifactorial, could be 2/2 lactic acidosis, explained by her diarrhea >7 days with hypovolemia (hypoperfusion) with AKI (her BUN:Cr  20:1 on admission) v. Lactic acidosis. Lactic acid level elevated at 2.6 on 8/28 with no peritoneal signs, likely 2/2 hypoperfusion present on admission 2/2 poor PO intake. Lab findings not c/w lactic acidosis at this time as plasma lactate <4. Lactic acid level trended down yesterday MRA with contrast showed negative for concerns of chronic bowel ischemia.    6. Hx Gluten intolerance.  -TTG IgA pending  upon discharge.  -Gluten free diet only.   7. Insomnia. Per pt and her daughter, this was started about 6 weeks ago, (in place of Zoloft to help her sleep) and has made the pt mildly somnolent during the day. Decreased home Trazadone from 50 to 25 mg qhs on 8/27 but patient has refused this medication since 8/28. Last night she received Ambien 2.5 mg and was agitated. Per her family, pt should not be on trazodone or Ambien. They would like to resume Zoloft and follow up as outpatient. Family aware that Zoloft may not help with sundowning, or insomnia but they are concerned about her excessive drowsiness with even small doses of trazodone and her increased confusion with Ambien. She is being discharged with initial geriatric dose titrated for possible Alzheimer's dementia-related depression at very low dose of Zoloft 12.5mg /day with increase in dose to 25mg  in 1-2 weeks (maxium dose of 150-200mg /day). She will follow up at the [facility] for titration.   8. Hx MGUS.  No acute problems during this admission. She is followed by Hematology/oncology as outpatient.   9. Hx renal cysts. Her AKI seemed to resolve prior to her discharge but her BUN was still mildly elevated. She has been followed by [facility].   10. DVT px. She was given, hep sq with no complications.    Discharge Vitals:  BP 115/67  Pulse 68  Temp 97.4 F (36.3 C) (Oral)  Resp 17  Ht 5\' 4"  (1.626 m)  Wt 141 lb 11.2 oz (64.275 kg)  BMI 24.32 kg/m2  SpO2 90%  Discharge Labs:  Results for orders placed during the hospital encounter of 12/23/11 (from the past 24 hour(s))  CBC     Status: Abnormal   Collection Time   12/28/11  6:41 AM      Component Value Range   WBC 7.3  4.0 - 10.5 K/uL   RBC 4.01  3.87 - 5.11 MIL/uL   Hemoglobin 11.6 (*) 12.0 - 15.0 g/dL   HCT 16.1 (*) 09.6 - 04.5 %   MCV 87.5  78.0 - 100.0 fL   MCH 28.9  26.0 - 34.0 pg   MCHC 33.0  30.0 - 36.0 g/dL   RDW 40.9  81.1 - 91.4 %   Platelets 207  150 - 400 K/uL    COMPREHENSIVE METABOLIC PANEL     Status: Abnormal   Collection Time   12/28/11  6:41 AM      Component Value Range   Sodium 144  135 - 145 mEq/L   Potassium 3.7  3.5 - 5.1 mEq/L   Chloride 114 (*) 96 - 112 mEq/L   CO2 20  19 - 32 mEq/L   Glucose, Bld 76  70 - 99 mg/dL   BUN 25 (*) 6 - 23 mg/dL   Creatinine, Ser 7.82  0.50 - 1.10 mg/dL   Calcium 8.5  8.4 - 95.6 mg/dL   Total Protein 5.8 (*) 6.0 - 8.3 g/dL   Albumin 2.5 (*) 3.5 - 5.2 g/dL   AST 17  0 - 37 U/L   ALT  12  0 - 35 U/L   Alkaline Phosphatase 71  39 - 117 U/L   Total Bilirubin 0.1 (*) 0.3 - 1.2 mg/dL   GFR calc non Af Amer 46 (*) >90 mL/min   GFR calc Af Amer 53 (*) >90 mL/min    Signed: Sara Chu D 12/28/2011, 3:39 PM   Time Spent on Discharge: 40 minutes

## 2011-12-28 NOTE — Progress Notes (Signed)
Subjective: She had some agitation last night, was given Ambien 2.5 mg. Her family had to be called but she eventually was able to sleep.  She is feeling better today, tolerating a gluen-free diet well.   She denies chest pain, shortness of breath, abdominal pain, N/V/D.   Objective: Vital signs in last 24 hours: Filed Vitals:   12/27/11 1800 12/27/11 2220 12/28/11 0453 12/28/11 1134  BP: 128/78 147/81 111/67 115/67  Pulse: 68 71 67 68  Temp: 97.8 F (36.6 C) 98.1 F (36.7 C) 97.4 F (36.3 C) 97.4 F (36.3 C)  TempSrc: Oral Oral Oral Oral  Resp: 18 17 17 17   Height:      Weight:  141 lb 11.2 oz (64.275 kg)    SpO2: 96% 98% 90%    Weight change: -2 lb 0.6 oz (-0.925 kg)  Intake/Output Summary (Last 24 hours) at 12/28/11 1437 Last data filed at 12/27/11 1900  Gross per 24 hour  Intake    120 ml  Output      0 ml  Net    120 ml  Vitals reviewed.  General: Sitting up in bed, smiling, in NAD, her daughter at there bedside.   HEENT: PERRL, no scleral icterus. MMM.  Cardiac: RRR, no rubs, murmurs or gallops  Pulm: bibasilar soft lung crackles, no wheezes, rales, or rhonchi  Abd: soft, nontender, remarkably less distended than yesterday, tympanic bowel sounds  Ext: warm and well perfused, no pedal edema. DP 1+ bilaterally  Neuro: alert and oriented X3, cranial nerves II-XII grossly intact, decreased bulk, normal tone    Lab Results: Basic Metabolic Panel:  Lab 12/28/11 2130 12/27/11 0615 12/23/11 1545  NA 144 146* --  K 3.7 3.0* --  CL 114* 115* --  CO2 20 19 --  GLUCOSE 76 89 --  BUN 25* 29* --  CREATININE 1.08 1.06 --  CALCIUM 8.5 8.0* --  MG -- -- 1.6  PHOS -- -- --   Liver Function Tests:  Lab 12/28/11 0641 12/27/11 0615  AST 17 14  ALT 12 10  ALKPHOS 71 70  BILITOT 0.1* 0.1*  PROT 5.8* 5.5*  ALBUMIN 2.5* 2.4*   CBC:  Lab 12/28/11 0641 12/27/11 0615 12/26/11 0635 12/23/11 1220  WBC 7.3 7.7 -- --  NEUTROABS -- -- 4.6 8.1*  HGB 11.6* 11.5* -- --  HCT  35.1* 34.2* -- --  MCV 87.5 88.6 -- --  PLT 207 192 -- --   Urinalysis:  Lab 12/23/11 1653  COLORURINE YELLOW  LABSPEC 1.022  PHURINE 5.5  GLUCOSEU NEGATIVE  HGBUR NEGATIVE  BILIRUBINUR SMALL*  KETONESUR 15*  PROTEINUR NEGATIVE  UROBILINOGEN 0.2  NITRITE NEGATIVE  LEUKOCYTESUR NEGATIVE   Micro Results: Recent Results (from the past 240 hour(s))  URINE CULTURE     Status: Normal   Collection Time   12/23/11  4:53 PM      Component Value Range Status Comment   Specimen Description URINE, CATHETERIZED   Final    Special Requests NONE   Final    Culture  Setup Time 12/23/2011 17:32   Final    Colony Count 4,000 COLONIES/ML   Final    Culture INSIGNIFICANT GROWTH   Final    Report Status 12/24/2011 FINAL   Final   CLOSTRIDIUM DIFFICILE BY PCR     Status: Normal   Collection Time   12/24/11 12:00 AM      Component Value Range Status Comment   C difficile by pcr NEGATIVE  NEGATIVE  Final   STOOL CULTURE     Status: Normal (Preliminary result)   Collection Time   12/24/11 12:01 AM      Component Value Range Status Comment   Specimen Description STOOL   Final    Special Requests NONE   Final    Culture NO SUSPICIOUS COLONIES, CONTINUING TO HOLD   Final    Report Status PENDING   Incomplete   OVA AND PARASITE EXAMINATION     Status: Normal   Collection Time   12/24/11 11:05 PM      Component Value Range Status Comment   Specimen Description STOOL   Final    Special Requests NONE   Final    Ova and parasites NO OVA OR PARASITES SEEN   Final    Report Status 12/27/2011 FINAL   Final    Studies/Results: Dg Chest 2 View  12/26/2011  *RADIOLOGY REPORT*  Clinical Data: Abnormal breath sounds  CHEST - 2 VIEW  Comparison: 08/14/2011  Findings: Cardiomegaly.  Mild perihilar interstitial prominence suggesting edema or chronic interstitial fibrosis.  No focal infiltrates or overt failure.  Normal osseous structures. Prominent colonic distention consistent with history of pseudo-  obstruction, slight increase from priors.  IMPRESSION: Stable chest with cardiomegaly. Prominent colonic distention.  No free air.   Original Report Authenticated By: Elsie Stain, M.D.    Mr [facility] Abdomen W Wo Contrast  12/28/2011  *RADIOLOGY REPORT*  Clinical Data:  Chronic abdominal pain, nausea, vomiting, diarrhea.  MRA ABDOMEN WITH AND WITHOUT CONTRAST  Technique:  Angiographic images of the abdomen were obtained using MRA technique with intravenous contrast.  Contrast:  15mL MULTIHANCE GADOBENATE DIMEGLUMINE 529 MG/ML IV SOLN  Comparison:  CT 12/23/2011 and earlier studies  Arterial findings: Aorta:                  Mild atheromatous irregularity without dissection, aneurysm, or stenosis.  Celiac axis:            Patent  Superior mesenteric:Patent  Left renal:             Single, patent.  Right renal:            Single, relatively diminutive, grossly patent.  Inferior mesenteric:Patent  Left iliac:             Common and visualized portions of internal and external iliac arteries are mildly tortuous without stenosis or aneurysm.  Right iliac:            Patent  Venous findings:  Patent portal vein, superior mesenteric vein, splenic vein, bilateral renal veins, IVC.   Review of the MIP images confirms the above findings.  Nonvascular findings: Multiple bilateral renal cysts.  Unremarkable limited evaluation of the liver, spleen, pancreas, adrenal glands. Lumbar fixation hardware noted.  Gaseous distention of visualized portions of small bowel and colon.  IMPRESSION:  1.  Mildly atheromatous abdominal aorta without significant visceral arterial stenosis or occlusion to suggest any etiology of occlusive mesenteric ischemia.   Original Report Authenticated By: Osa Craver, M.D.    Medications: I have reviewed the patient's current medications. Scheduled Meds:   . aspirin EC  81 mg Oral Daily  . feeding supplement  237 mL Oral Q24H  . feeding supplement  1 Container Oral TID BM  . haloperidol  lactate  0.5 mg Intravenous Once  . heparin subcutaneous  5,000 Units Subcutaneous Q8H  . hydrocortisone  20 mg Oral q morning - 10a  And  . hydrocortisone  10 mg Oral QHS  . paricalcitol  1 mcg Oral Q M,W,F  . potassium chloride  30 mEq Oral BID  . traZODone  50 mg Oral QHS  . DISCONTD: traZODone  25 mg Oral QHS   Continuous Infusions:  PRN Meds:.acetaminophen, acetaminophen, gadobenate dimeglumine, ondansetron (ZOFRAN) IV, ondansetron, zolpidem Assessment/Plan: 78 y.o woman presenting with N/V/D and decreased PO.   1.Diarrhea. Now resolved. Prior to admission this persisted for over a week. In addition she had nausea, vomiting that improved with Reglan, and dehydration with decreased po intake. She was admitted and treated for possible diverticulitis on 7/13 and was given Cipro and Flagyl. This could be C. Diff colitis v. gastroenteritis v diverticulitis v toxic megacolon. She has h/o colon cancer and s/p right hemicolectomy and bowel obstruction (followed by Dr. Loreta Ave but discharged from that practice). She had negative bx for celiac disease but elevated TTG ~90 (nl <20) and was placed on a gluten free diet which her daughters report as being a challenge while the pt was at [facility] but full compliance now that the pt is home with 24hr care by family.  -CT ab pelvis without contrast given creatinine of 1.7 (baseline of 1.3) showing no acute changes from previous CT abdomen with contrast on 8/21  - GI consulted. Per Dr. Malon Kindle, will repeat TGG IgA for questionable celiac disease with negative bx. Surveillance colonoscopy not recommended for this 78 year-old in case of positive FOBT.  - Urine culture with insignificant growth  - C. Diff stool PCR negative  - Ordered FOBT  -CXR on 8/29 showing prominent colonic distention but no free air.  -Stool culture negative per prelim report -Stool with no O&P --Pt's plasma lactic acid elevated at 2.6-->3.5--> down to 2.2 yesterday. Ordered MRA (without  contrast) for evaluation of chronic bowel ischemia. MRA per above, showed mildly atheromatous abdominal aorta without significant visceral arterial stenosis or occlusion to suggest any etiology of occlusive mesenteric ischemia. -Per GI, Dr. Ewing Schlein, given negative MRA, pt stable to be discharge home with outpatient follow up.  Plan:  - Rx Zofran 4 mg q8 prn iv, but pt has not needed this medication since her admission. Will consider discontinuing it if worsening of prolonged QTc  - Discontinued hydration with NS 100 cc/hr, given hx of pulmonary edema on prior admissions and mild hypernatremia/hyperchloremia.  - Discontinued Reglan 5mg  BID, on 8/29 based on clinical improvement. Pt with continued improved affect today.  - Tylenol 650 mg prn  - f/u on TTG IgA, pt will need follow up with GI as outpatient for evaluation and further management of likely chronic intestinal pseudo-obstruction.   2. Dizziness/Lightheadedness. Resolved. Likely 2/2 to decreased po and dehydration. No orthostatics per recorded values but taken after IVF  -on 8/29. Decreased NS at 119ml/hr to 49ml/h given bibasilar lung crackles today and improved PO intake.  -Discontinued IVF on 8/30 based on bibasilar lung crackles and hypernatremia/hyperchloremia, and hx of pulmonary edema with IVF.   3. Acute kidney insuficiency. Improving. Baseline <1.1, Cr 1.7 on admission. Her Hg baseline ~10-11, it was 14 on admission, concern for hemoconcentration. She did have CT abdomen and pelvis with contrast on 8/21, this could be a component of IV dye contrast nephrotoxicity. Creatinine at 1.08 today, although BUN still elevated at 25 but has trended down. Pt tolerating PO intake well today.  -Discontinued IVF per above.  -monitor CMP   4. Adrenal suppression. Her original home dose was Cortef 10  mg qam and 5 mg qpm, however, her dose was increased to 20mg  qAM and 10mg  qPM since her discharge from the hospital. She had an appointment with Dr.  Sharl Ma, her endocrinologist today as a hospital follow-up. Per Dr. Sharl Ma, will give her stress dose of 50mg  q6hrs with plan to discharge with 20mg  qAM and 10mg  qPM with follow up in 2-3 days after her discharge.  -Discontinued hydrocortisone 50mg  q6h on 8/29.  -Started Cortef 20mg  in AM and 10mg  in PM on 8/29   5. Severe malnutrition. Likely multifactorial with component of gluten and lactose intolerance, gut dysmotility, recurrent diarrhea, possible chronic intestinal pseudo-obstruction, and recent N/V with poor PO intake. Nutritionist following her closely. GI has been consulted, repeat TTG IgA pending. She is hypocalcemic based on serum Ca but this corrects to ~10 with her albumin of 2.5. Total protein of 5.8 today.  -AM CMP  -Gluten free, lactose free nutritional supplementation per nutritionist.   6. Metabolic acidosis. Resolved on 8/30. Bicarb of 16 on 8/29, anion gap of 11, she was also hyperchloremic with Cl at 116. She had diarrhea at least twice on 8/28 and had been infused with NS at 100cc/h, so her normal gap hyperchloremic metabolic acidosis could be a component of bicarb loss in diarrhea with NS infusion. She had anion gap of 16 on admission. This resolved the next day of her admission but was initially thought to be likely multifactorial, could be 2/2 lactic acidosis, explained by her diarrhea >7 days with hypovolemia (hypoperfusion) with AKI (her BUN:Cr 20:1 on admission) v. Lactic acidosis. Lactic acid level elevated at 2.6 on 8/28 with no peritoneal signs, likely 2/2 hypoperfusion present on admission 2/2 poor PO intake. Lab findings not c/w lactic acidosis at this time as plasma lactate <4.  -Lactic acid level trended down yesterday  -Discontinued IVF  -MRA with contrast showed negative for concerns of chronic bowel ischemia.  -Will check AM CMP.   6. Hx Gluten intolerance  -Gluten free diet only.   7. Insomnia. Per pt and her daughter, this was started about 6 weeks ago, (in place  of Zoloft to help her sleep) and has made the pt mildly somnolent during the day. Decreased home Trazadone from 50 to 25 mg qhs on 8/27 but patient has refused this medication since 8/28. Last night she received Ambien 2.5 mg and was agitated. Per her family, pt should not be on trazodone or Ambien. They would like to resume Zoloft and follow up as outpatient. Family aware that Zoloft may not help with sundowning, or insomnia but they are concerned about her excessive drowsiness with even small doses of trazodone and her increased confusion with Ambien.  -Pt to be discharged with initial geriatric dose titrated for possible Alzheimer's dementia-related depression at very low dose of Zoloft 12.5mg /day with increase in dose to 25mg  in 1-2 weeks (maxium dose of 150-200mg /day)  8. Hx MGUS  -pt following Hematology/oncology   9. Hx renal cysts  -pt following [facility]   10. DVT px  -Hep sq    LOS: 5 days   Ky Barban 12/28/2011, 2:37 PM

## 2011-12-28 NOTE — Progress Notes (Signed)
Patient became very agitated and combative. Patient was wet and trying to get out of bed. She would not let us help her or touch her. Patient began to scream, kick, swing and cry. Called patient's daughter and informed her. She stated she would call her mother and try and calm her down. Patient began to then argue with the daughter on the phone, patient then handed me the phone to talk. Daughter stated that she would be back at to the hospital in ten minutes. I then asked the daughter if she could try and ask her mother if she would allow [place] to change her since she was soaking wet. Patient would still not let us change her. Family members (both daughters) finally arrived and helped calm down the patient and we were able to put new clothes and linen on her and she went back to sleep. Family stated that they would stay the night to ensure she got some sleep. Will continue to monitor patient.

## 2011-12-28 NOTE — Progress Notes (Signed)
12/28/2011 1130 Faxed orders, F2F and facesheet to [place]. Spoke to rep, Vernona Rieger. No d/c order at this time. Isidoro Donning RN CCM Case Mgmt phone [phone]

## 2011-12-31 LAB — POTASSIUM, STOOL: Potassium, Stl: 68 mEq/L

## 2011-12-31 LAB — SODIUM, STOOL: Sodium, Stl: 67.9 mEq/L

## 2012-01-03 ENCOUNTER — Encounter: Payer: Self-pay | Admitting: Internal Medicine

## 2012-01-03 ENCOUNTER — Ambulatory Visit (INDEPENDENT_AMBULATORY_CARE_PROVIDER_SITE_OTHER): Payer: Medicare Other | Admitting: Internal Medicine

## 2012-01-03 VITALS — BP 103/68 | HR 72 | Temp 97.0°F | Ht 64.5 in | Wt 123.5 lb

## 2012-01-03 DIAGNOSIS — F3289 Other specified depressive episodes: Secondary | ICD-10-CM

## 2012-01-03 DIAGNOSIS — R197 Diarrhea, unspecified: Secondary | ICD-10-CM

## 2012-01-03 DIAGNOSIS — N189 Chronic kidney disease, unspecified: Secondary | ICD-10-CM

## 2012-01-03 DIAGNOSIS — E2740 Unspecified adrenocortical insufficiency: Secondary | ICD-10-CM

## 2012-01-03 DIAGNOSIS — F329 Major depressive disorder, single episode, unspecified: Secondary | ICD-10-CM

## 2012-01-03 DIAGNOSIS — E2749 Other adrenocortical insufficiency: Secondary | ICD-10-CM

## 2012-01-03 DIAGNOSIS — N289 Disorder of kidney and ureter, unspecified: Secondary | ICD-10-CM

## 2012-01-03 NOTE — Assessment & Plan Note (Signed)
It is well controlled. Patient is taking Zoloft 12.5 mg daily. She does not have symptoms for depression. She feels great and not depressed at all. We'll continue current regimen.

## 2012-01-03 NOTE — Assessment & Plan Note (Signed)
Patient's diarrhea has completely resolved. She does not have any nausea, vomiting, abdominal pain or diarrhea today. She is taking gluten free diet. No further treatment needed.

## 2012-01-03 NOTE — Progress Notes (Signed)
Patient ID: Rachel Bridges, female   DOB: 1925-01-11, 78 y.o.   MRN: [id number]  Subjective:   Patient ID: Rachel Bridges female   DOB: 1924/09/12 78 y.o.   MRN: [id number]  HPI: [name] is a 78 y.o. with past medical history as outlined below, who presents for a hospital followup visit for today.  1.) Dirrhea: Patient was recently hospitalized from 826 to 8/31 due to diarrhea. Urine culture with insignificant growth.  C. Diff stool PCR negative.  MRA showed mildly atheromatous abdominal aorta without significant visceral arterial stenosis or occlusion to suggest any etiology of occlusive mesenteric ischemia. Patient was treated with IV fluids and symptomatical management. She was discharged at stable condition. This patient has a history of gluten intolerance and was not compliant to her gluten free food. Patient was instructed to eat gluten free food at the discharge. Today patient reports that she feels great. She does not have diarrhea, nausea or vomiting. She has been taking gluten free diet since been discharged from a hospital.  2.) Adrenal insufficiency: Per Dr. Sharl Ma, patient was treated with stress dose Cortef of 50mg  q6hrs until 8/30. She was discharged on Cortef 20mg  qAM and 10mg  qPM. She is to see Dr. Sharl Ma at 01/06/12. She does not have symptoms for adrenal insufficiency, such as lack of energy, low blood pressure and abdominal pain.   3.) Depression: Patient is taking Zoloft 12.5 mg daily. She does not have symptoms for depression. She feels great and not depressed at all.  4.) Acute kidney insufficiency. Patient's baseline <1.1, Cr 1.7 on admission and trended down to 1.08 at discharge, but her BUN still elevated at 25. She does not have worsening leg edema, shortness rest or nausea/vomiting for azotemia today.    Past Medical History  Diagnosis Date  . MGUS (monoclonal gammopathy of unknown significance)      [facility]  . Anxiety    . History of colon cancer      History of bowel  obstruction. on treatment with Colace and MiraLax.  followed by Dr. Loreta Ave and Dr. Purnell Shoemaker  . Depression   . Diverticulosis of colon   . Osteopenia   . Lumbar spinal stenosis   . Atrophic vaginitis   . Renal cyst   . History of tobacco abuse     at least 64 pack year. Started about age 58 and continued until about age 87.  Marland Kitchen Dermatitis   . Hip pain      History  . Actinic keratosis   . Dementia   . Adrenal suppression     central, on chronic steroids, followed by Dr. Sharl Ma of endocrinology  . Gluten intolerance     August 2011 endoscopy with biopsy negative for celiac disease. Prior to this patient had elevated TTG IgG of 93 (normal <20). Dr Loreta Ave made clinical diagnosis of gluten intolerance based on diarrhea/symptoms from eating gluten. Needs GF diet.    Current Outpatient Prescriptions  Medication Sig Dispense Refill  . aspirin 81 MG tablet Take 81 mg by mouth daily.       . calcium citrate-vitamin D (CITRACAL+D) 315-200 MG-UNIT per tablet Take 1 tablet by mouth 2 (two) times daily.      . cyanocobalamin (,VITAMIN B-12,) 1000 MCG/ML injection Inject 1,000 mcg into the muscle every 30 (thirty) days.      . famotidine (PEPCID) 40 MG tablet Take 40 mg by mouth Daily.      Marland Kitchen  feeding supplement (ENSURE COMPLETE) LIQD Take 237 mLs by mouth daily.  30 Bottle  3  . feeding supplement (ENSURE) PUDG Take 1 Container by mouth 3 (three) times daily between meals.  90 Container  3  . ferrous fumarate (HEMOCYTE - 106 MG FE) 325 (106 FE) MG TABS Take 1 tablet by mouth 3 (three) times daily.      . furosemide (LASIX) 20 MG tablet Take 20 mg by mouth daily.      . hydrocortisone (CORTEF) 10 MG tablet Take 5-20 mg by mouth 2 (two) times daily. 20 mg in a.m. And 5 mg at 5 p.m.      Marland Kitchen paricalcitol (ZEMPLAR) 1 MCG capsule Take 1 mcg by mouth every Monday, Wednesday, and Friday.      . potassium chloride (K-DUR) 10 MEQ tablet Take 20 mEq by mouth 2 (two)  times daily.      . sennosides-docusate sodium (SENOKOT-S) 8.6-50 MG tablet Take 1 tablet by mouth 2 (two) times daily.      . sertraline (ZOLOFT) 25 MG tablet Take 0.5 tablets (12.5 mg total) by mouth daily.  30 tablet  2   No family history on file. History   Social History  . Marital Status: Widowed    Spouse Name: N/A    Number of Children: N/A  . Years of Education: N/A   Social History Main Topics  . Smoking status: Former Smoker -- 1.0 packs/day for 24 years    Types: Cigarettes    Quit date: 08/01/1988  . Smokeless tobacco: None  . Alcohol Use: No  . Drug Use: No  . Sexually Active: No   Other Topics Concern  . None   Social History Narrative    Husband died of myeloma. Patient is to daughter's one of whom she lives with her.   Review of Systems:  General: no fevers, chills, no changes in body weight, no changes in appetite Skin: no rash HEENT: no blurry vision, hearing changes or sore throat Pulm: no dyspnea, coughing, wheezing CV: no chest pain, palpitations, shortness of breath Abd: no nausea/vomiting, abdominal pain, diarrhea/constipation GU: no dysuria, hematuria, polyuria Ext: no arthralgias, myalgias Neuro: no weakness, numbness, or tingling   Objective:  Physical Exam: Filed Vitals:   01/03/12 1341  BP: 103/68  Pulse: 72  Temp: 97 F (36.1 C)  TempSrc: Oral  Height: 5' 4.5" (1.638 m)  Weight: 123 lb 8 oz (56.019 kg)  SpO2: 94%   General: resting in bed, not in acute distress HEENT: PERRL, EOMI, no scleral icterus Cardiac: S1/S2, RRR, No murmurs, gallops or rubs Pulm: Good air movement bilaterally, Clear to auscultation bilaterally, No rales, wheezing, rhonchi or rubs. Abd: Soft,  nondistended, nontender, no rebound pain, no organomegaly, BS present Ext: No rashes, 2+DP/PT pulse bilaterally. Has 1+ leg edema bilaterally. Musculoskeletal: No joint deformities, erythema, or stiffness, ROM full and nontender Skin: no rashes. No skin  bruise. Neuro: alert and oriented X3, cranial nerves II-XII grossly intact, muscle strength 5/5 in all extremeties,  sensation to light touch intact.  Psych.: patient is not psychotic, no suicidal or hemocidal ideation.  Assessment & Plan:

## 2012-01-03 NOTE — Patient Instructions (Signed)
1. You have done great job in taking all your medications. I appreciate it very much. Please continue doing that. It is very important to eat gluten free food. 2. Please take all medications as prescribed.  3. If you have worsening of your symptoms or new symptoms arise, please call the clinic ([phone]), or go to the ER immediately if symptoms are severe.

## 2012-01-03 NOTE — Assessment & Plan Note (Signed)
Per Dr. Sharl Ma, patient was treated with stress dose Cortef of 50mg  q6hrs in hospital. She was discharged on Cortef 20mg  qAM and 10mg  qPM. She is to see Dr. Sharl Ma at 01/06/12. She does not have symptoms for adrenal insufficiency, such as lack of energy, low blood pressure and abdominal pain. Will follow up.

## 2012-01-03 NOTE — Assessment & Plan Note (Signed)
Patient's baseline was <1.1, Cr was 1.7 on admission and trended down to 1.08 at discharge, but her BUN still elevated at 25. She does not have worsening leg edema, shortness rest or nausea/vomiting for azotemia today. Will check BMP today.

## 2012-01-04 LAB — BASIC METABOLIC PANEL WITH GFR
GFR, Est African American: 63 mL/min
GFR, Est Non African American: 54 mL/min — ABNORMAL LOW
Potassium: 4 mEq/L (ref 3.5–5.3)
Sodium: 144 mEq/L (ref 135–145)

## 2012-01-13 ENCOUNTER — Telehealth: Payer: Self-pay | Admitting: Hematology and Oncology

## 2012-01-13 ENCOUNTER — Other Ambulatory Visit: Payer: Self-pay | Admitting: Hematology and Oncology

## 2012-01-13 DIAGNOSIS — D472 Monoclonal gammopathy: Secondary | ICD-10-CM

## 2012-01-13 NOTE — Telephone Encounter (Signed)
S/w dtr [name] re appts for 10/11 lb/xray and 10/16 LO.

## 2012-01-15 ENCOUNTER — Telehealth: Payer: Self-pay | Admitting: Hematology and Oncology

## 2012-01-15 NOTE — Telephone Encounter (Signed)
S.W. Demetrice Floyd advising of appt d.t. change....sed

## 2012-02-07 ENCOUNTER — Other Ambulatory Visit: Payer: Self-pay | Admitting: Hematology and Oncology

## 2012-02-07 ENCOUNTER — Ambulatory Visit (HOSPITAL_COMMUNITY)
Admission: RE | Admit: 2012-02-07 | Discharge: 2012-02-07 | Disposition: A | Discharge status: Home / Self Care | Payer: Medicare Other | Source: Ambulatory Visit | Attending: Hematology and Oncology | Admitting: Hematology and Oncology

## 2012-02-07 ENCOUNTER — Other Ambulatory Visit (HOSPITAL_BASED_OUTPATIENT_CLINIC_OR_DEPARTMENT_OTHER): Payer: Medicare Other

## 2012-02-07 DIAGNOSIS — C189 Malignant neoplasm of colon, unspecified: Secondary | ICD-10-CM

## 2012-02-07 DIAGNOSIS — D472 Monoclonal gammopathy: Secondary | ICD-10-CM

## 2012-02-07 DIAGNOSIS — M899 Disorder of bone, unspecified: Secondary | ICD-10-CM | POA: Insufficient documentation

## 2012-02-07 DIAGNOSIS — Z981 Arthrodesis status: Secondary | ICD-10-CM | POA: Insufficient documentation

## 2012-02-07 DIAGNOSIS — M5030 Other cervical disc degeneration, unspecified cervical region: Secondary | ICD-10-CM | POA: Insufficient documentation

## 2012-02-07 DIAGNOSIS — Z96659 Presence of unspecified artificial knee joint: Secondary | ICD-10-CM | POA: Insufficient documentation

## 2012-02-07 LAB — COMPREHENSIVE METABOLIC PANEL
ALT: 11 U/L (ref 0–35)
AST: 13 U/L (ref 0–37)
Alkaline Phosphatase: 110 U/L (ref 39–117)
Calcium: 7.8 mg/dL — ABNORMAL LOW (ref 8.4–10.5)
Chloride: 112 mEq/L (ref 96–112)
Creatinine, Ser: 1.27 mg/dL — ABNORMAL HIGH (ref 0.50–1.10)
Potassium: 3.6 mEq/L (ref 3.5–5.3)

## 2012-02-07 LAB — CBC WITH DIFFERENTIAL/PLATELET
Basophils Absolute: 0 10*3/uL (ref 0.0–0.1)
Eosinophils Absolute: 0.1 10*3/uL (ref 0.0–0.5)
HGB: 13 g/dL (ref 11.6–15.9)
NEUT#: 4.6 10*3/uL (ref 1.5–6.5)
RDW: 15.3 % — ABNORMAL HIGH (ref 11.2–14.5)
lymph#: 1.5 10*3/uL (ref 0.9–3.3)

## 2012-02-11 ENCOUNTER — Telehealth: Payer: Self-pay | Admitting: *Deleted

## 2012-02-11 ENCOUNTER — Ambulatory Visit: Payer: Medicare Other | Admitting: Family

## 2012-02-11 LAB — SPEP & IFE WITH QIG
Albumin ELP: 48.6 % — ABNORMAL LOW (ref 55.8–66.1)
Beta 2: 6.8 % — ABNORMAL HIGH (ref 3.2–6.5)
IgA: 510 mg/dL — ABNORMAL HIGH (ref 69–380)
IgM, Serum: 68 mg/dL (ref 52–322)
Total Protein, Serum Electrophoresis: 6.2 g/dL (ref 6.0–8.3)

## 2012-02-11 NOTE — Telephone Encounter (Signed)
Received call from daughter [name] requesting to reschedule pt's f/u appt today.   Spoke with [facility] and was informed that she just had knee surgery and could not bring pt to visit appt today.   Gave [name] new appt date and time for pt on 02/25/12.   [name] voiced understanding. [name]'s  Phone     [phone].

## 2012-02-12 ENCOUNTER — Ambulatory Visit: Payer: Medicare Other | Admitting: Hematology and Oncology

## 2012-02-12 ENCOUNTER — Encounter: Payer: Self-pay | Admitting: Internal Medicine

## 2012-02-12 DIAGNOSIS — E2740 Unspecified adrenocortical insufficiency: Secondary | ICD-10-CM

## 2012-02-12 DIAGNOSIS — E538 Deficiency of other specified B group vitamins: Secondary | ICD-10-CM

## 2012-02-12 DIAGNOSIS — D472 Monoclonal gammopathy: Secondary | ICD-10-CM

## 2012-02-12 DIAGNOSIS — N189 Chronic kidney disease, unspecified: Secondary | ICD-10-CM

## 2012-02-12 DIAGNOSIS — Z5329 Procedure and treatment not carried out because of patient's decision for other reasons: Secondary | ICD-10-CM

## 2012-02-12 MED ORDER — CALCIUM CITRATE-VITAMIN D 315-200 MG-UNIT PO TABS: 1.0000 | ORAL_TABLET | Freq: Two times a day (BID) | ORAL | Status: DC

## 2012-02-14 ENCOUNTER — Ambulatory Visit: Payer: Medicare Other | Admitting: Hematology and Oncology

## 2012-02-19 ENCOUNTER — Telehealth: Payer: Self-pay | Admitting: *Deleted

## 2012-02-19 NOTE — Telephone Encounter (Signed)
Spoke with daughter [name] and gave her new time for pt's appt on 02/25/12  At  330 pm.   [facility] voiced understanding. [name]'s  Phone    [phone].

## 2012-02-25 ENCOUNTER — Encounter: Payer: Self-pay | Admitting: Hematology and Oncology

## 2012-02-25 ENCOUNTER — Telehealth: Payer: Self-pay | Admitting: Hematology and Oncology

## 2012-02-25 ENCOUNTER — Ambulatory Visit (HOSPITAL_BASED_OUTPATIENT_CLINIC_OR_DEPARTMENT_OTHER): Payer: Medicare Other | Admitting: Hematology and Oncology

## 2012-02-25 VITALS — BP 93/67 | HR 87 | Temp 97.4°F | Resp 20 | Ht 64.5 in | Wt 122.1 lb

## 2012-02-25 DIAGNOSIS — D472 Monoclonal gammopathy: Secondary | ICD-10-CM

## 2012-02-25 DIAGNOSIS — D539 Nutritional anemia, unspecified: Secondary | ICD-10-CM | POA: Insufficient documentation

## 2012-02-25 DIAGNOSIS — R944 Abnormal results of kidney function studies: Secondary | ICD-10-CM

## 2012-02-25 NOTE — Telephone Encounter (Signed)
Dtr not able to finish scheduling appts w/me due to she was running late. Per dtr appts for 10/22 and 10/29 2014 mailed including appt for bone survey. Per LO bone survey to be done 1wk before next visit.

## 2012-02-25 NOTE — Progress Notes (Signed)
This office note has been dictated.

## 2012-02-25 NOTE — Patient Instructions (Signed)
Rachel Bridges  [id number]   [facility] - AFTER VISIT SUMMARY   **RECOMMENDATIONS MADE BY THE CONSULTANT AND ANY TEST    RESULTS WILL BE SENT TO YOUR REFERRING DOCTORS.   YOUR EXAM FINDINGS, LABS AND RESULTS WERE DISCUSSED BY YOUR MD TODAY.  YOU CAN GO TO THE  WEB SITE FOR INSTRUCTIONS ON HOW TO ASSESS MY CHART FOR ADDITIONAL INFORMATION AS NEEDED.  Your Updated drug allergies are: Allergies as of 02/25/2012 - Review Complete 01/03/2012  Allergen Reaction Noted  . Morphine and related Other (See Comments) 03/02/2011  . Oxycodone hcl (oxycodone hcl) Other (See Comments) 03/02/2011  . Pantoprazole (pantoprazole sodium) Nausea Only 03/02/2011    Your current list of medications are: Current Outpatient Prescriptions  Medication Sig Dispense Refill  . aspirin 81 MG tablet Take 81 mg by mouth daily.       . calcium citrate-vitamin D (CITRACAL+D) 315-200 MG-UNIT per tablet Take 1 tablet by mouth 2 (two) times daily.  60 tablet  11  . cyanocobalamin (,VITAMIN B-12,) 1000 MCG/ML injection Inject 1,000 mcg into the muscle every 30 (thirty) days. Usually on the 21st of each month      . cyanocobalamin (,VITAMIN B-12,) 1000 MCG/ML injection Inject 1,000 mcg into the muscle every 30 (thirty) days.      . famotidine (PEPCID) 40 MG tablet Take 40 mg by mouth Daily.      . feeding supplement (ENSURE COMPLETE) LIQD Take 237 mLs by mouth daily.  30 Bottle  3  . feeding supplement (ENSURE) PUDG Take 1 Container by mouth 3 (three) times daily between meals.  90 Container  3  . ferrous fumarate (HEMOCYTE - 106 MG FE) 325 (106 FE) MG TABS Take 1 tablet by mouth 3 (three) times daily.      . furosemide (LASIX) 20 MG tablet Take 20 mg by mouth daily.      . hydrocortisone (CORTEF) 10 MG tablet Take 1 tablet (10 mg total) by mouth daily.  30 tablet  1  . hydrocortisone (CORTEF) 10 MG tablet Take 5-20 mg by mouth 2 (two) times daily. 20 mg in a.m. And 5 mg at 5 p.m.      Marland Kitchen  hydrocortisone (CORTEF) 5 MG tablet Take 3 tablets (15 mg total) by mouth daily. between 6-8 a.m. around the same time every day  100 tablet  4  . hydrocortisone (CORTEF) 5 MG tablet Take 2 tablets (10 mg total) by mouth daily. Between 4-6 p.m. around the same time every day.  70 tablet  4  . hydrocortisone (CORTEF) 5 MG tablet Take 1 tablet (5 mg total) by mouth daily.  30 tablet  1  . paricalcitol (ZEMPLAR) 1 MCG capsule Take 1 mcg by mouth every Monday, Wednesday, and Friday.      . potassium chloride (K-DUR) 10 MEQ tablet Take 20 mEq by mouth 2 (two) times daily.      . sennosides-docusate sodium (SENOKOT-S) 8.6-50 MG tablet Take 1 tablet by mouth 2 (two) times daily.      . sertraline (ZOLOFT) 25 MG tablet Take 0.5 tablets (12.5 mg total) by mouth daily.  30 tablet  2     INSTRUCTIONS GIVEN AND DISCUSSED:  See attached schedule   SPECIAL INSTRUCTIONS/FOLLOW-UP:  See above.  I acknowledge that I have been informed and understand all the instructions given to me and received a copy.I know to contact the clinic, my physician, or go to the emergency Department if any problems  should occur.   I do not have any more questions at this time, but understand that I may call the [facility] at [phone] during business hours should I have any further questions or need assistance in obtaining follow-up care.

## 2012-02-26 NOTE — Progress Notes (Signed)
CC:   Lorretta Harp, MD  IDENTIFYING STATEMENT:  The patient is an 78 year old woman with a monoclonal gammopathy of undetermined significance, who presents for followup.  INTERVAL HISTORY:  The patient was seen a year ago.  She is here with her daughter.  Reports no current concerns.  Notes overall decreasing energy level, but is able to perform all activities of daily living. Denies bone pain.  Has had no loss in weight.  MEDICATIONS:  Reviewed and updated.  ALLERGIES: 1. Morphine. 2. Oxycodone. 3. Pantoprazole.Marland Kitchen  MEDICATIONS:  Reviewed and updated.  PHYSICAL EXAMINATION:  General:  The patient is alert and oriented x3. Vitals:  Pulse 87, blood pressure 93/67, temperature 97.4, respirations 20, weight 122 pounds.  HEENT:  Mouth moist.  Chest:  Clear.  Abdomen: Soft.  No masses.  Bowel sounds present.  Extremities:  No calf tenderness.  LAB DATA:  On 02/07/2012:  White cell count 6.6, hemoglobin 13, hematocrit 39.3, platelets 225,000.  Sodium 142, potassium 3.6, chloride 112, CO2 18, BUN 21, creatinine 1.27, calcium 7.8.  T-bilirubin 0.4, alkaline phosphatase 110, AST 13, ALT 11.  IgG 1400, IgA 510.  Metastatic bone survey on 02/07/2012 showed stable lytic lesions in the calvarium, with no focal abnormalities.  IMPRESSION AND PLAN:  Mrs. Lax is an 78 year old woman with IgA monoclonal gammopathy of undetermined significance.  Protein markers and skeletal survey remain stable when compared with the results from a year ago.  Her creatinine is slightly elevated when compared to a month ago. She reports decreased hydration.  I have encouraged that she improve upon this.  I have also encouraged her to touch base with Dr. Chaney Malling to repeat renal function testing in about 3 months' time.  From my standpoint, she follows up in a year's time with blood work and to include myeloma markers.    ______________________________ Laurice Record, M.D. LIO/MEDQ  D:  02/25/2012  T:   02/26/2012  Job:  [id number]

## 2012-03-28 ENCOUNTER — Other Ambulatory Visit: Payer: Self-pay | Admitting: Internal Medicine

## 2012-04-08 ENCOUNTER — Other Ambulatory Visit: Payer: Self-pay | Admitting: *Deleted

## 2012-04-08 MED ORDER — SERTRALINE HCL 25 MG PO TABS: 12.5000 mg | ORAL_TABLET | Freq: Every day | ORAL | Status: DC

## 2012-06-01 ENCOUNTER — Encounter (HOSPITAL_COMMUNITY): Payer: Self-pay | Admitting: Emergency Medicine

## 2012-06-01 ENCOUNTER — Emergency Department (HOSPITAL_COMMUNITY): Payer: Medicare Other

## 2012-06-01 ENCOUNTER — Observation Stay (HOSPITAL_COMMUNITY)
Admission: EM | Admit: 2012-06-01 | Discharge: 2012-06-02 | Disposition: A | Discharge status: Home / Self Care | Payer: Medicare Other | Attending: Internal Medicine | Admitting: Internal Medicine

## 2012-06-01 DIAGNOSIS — Z9181 History of falling: Secondary | ICD-10-CM | POA: Insufficient documentation

## 2012-06-01 DIAGNOSIS — R112 Nausea with vomiting, unspecified: Secondary | ICD-10-CM | POA: Insufficient documentation

## 2012-06-01 DIAGNOSIS — K5901 Slow transit constipation: Secondary | ICD-10-CM | POA: Diagnosis present

## 2012-06-01 DIAGNOSIS — D472 Monoclonal gammopathy: Secondary | ICD-10-CM

## 2012-06-01 DIAGNOSIS — K5900 Constipation, unspecified: Principal | ICD-10-CM | POA: Insufficient documentation

## 2012-06-01 DIAGNOSIS — R197 Diarrhea, unspecified: Secondary | ICD-10-CM | POA: Insufficient documentation

## 2012-06-01 DIAGNOSIS — R141 Gas pain: Secondary | ICD-10-CM | POA: Insufficient documentation

## 2012-06-01 DIAGNOSIS — M25519 Pain in unspecified shoulder: Secondary | ICD-10-CM | POA: Insufficient documentation

## 2012-06-01 DIAGNOSIS — R143 Flatulence: Secondary | ICD-10-CM | POA: Insufficient documentation

## 2012-06-01 DIAGNOSIS — R142 Eructation: Secondary | ICD-10-CM | POA: Insufficient documentation

## 2012-06-01 DIAGNOSIS — R109 Unspecified abdominal pain: Secondary | ICD-10-CM | POA: Insufficient documentation

## 2012-06-01 DIAGNOSIS — M25569 Pain in unspecified knee: Secondary | ICD-10-CM | POA: Insufficient documentation

## 2012-06-01 DIAGNOSIS — K567 Ileus, unspecified: Secondary | ICD-10-CM

## 2012-06-01 DIAGNOSIS — D539 Nutritional anemia, unspecified: Secondary | ICD-10-CM

## 2012-06-01 DIAGNOSIS — R4182 Altered mental status, unspecified: Secondary | ICD-10-CM | POA: Insufficient documentation

## 2012-06-01 LAB — CBC WITH DIFFERENTIAL/PLATELET
Basophils Absolute: 0 10*3/uL (ref 0.0–0.1)
Basophils Relative: 0 % (ref 0–1)
Eosinophils Relative: 0 % (ref 0–5)
HCT: 35.2 % — ABNORMAL LOW (ref 36.0–46.0)
MCHC: 32.7 g/dL (ref 30.0–36.0)
Monocytes Absolute: 0.3 10*3/uL (ref 0.1–1.0)
Neutro Abs: 8.6 10*3/uL — ABNORMAL HIGH (ref 1.7–7.7)
Platelets: 190 10*3/uL (ref 150–400)
RDW: 13.5 % (ref 11.5–15.5)

## 2012-06-01 LAB — LACTIC ACID, PLASMA: Lactic Acid, Venous: 5.1 mmol/L — ABNORMAL HIGH (ref 0.5–2.2)

## 2012-06-01 LAB — COMPREHENSIVE METABOLIC PANEL
AST: 14 U/L (ref 0–37)
Albumin: 2.3 g/dL — ABNORMAL LOW (ref 3.5–5.2)
Calcium: 8.2 mg/dL — ABNORMAL LOW (ref 8.4–10.5)
Chloride: 104 mEq/L (ref 96–112)
Creatinine, Ser: 1.07 mg/dL (ref 0.50–1.10)

## 2012-06-01 LAB — URINALYSIS, ROUTINE W REFLEX MICROSCOPIC
Glucose, UA: NEGATIVE mg/dL
Leukocytes, UA: NEGATIVE
pH: 5.5 (ref 5.0–8.0)

## 2012-06-01 MED ORDER — DEXTROSE-NACL 5-0.45 % IV SOLN: INTRAVENOUS | Status: DC

## 2012-06-01 MED ORDER — SODIUM CHLORIDE 0.9 % IV BOLUS (SEPSIS)
500.0000 mL | Freq: Once | INTRAVENOUS | Status: AC
  Administered 2012-06-01: 500 mL via INTRAVENOUS

## 2012-06-01 MED ORDER — SODIUM CHLORIDE 0.9 % IV SOLN
INTRAVENOUS | Status: DC
  Administered 2012-06-01: 1000 mL via INTRAVENOUS
  Administered 2012-06-01: 19:00:00 via INTRAVENOUS

## 2012-06-01 MED ORDER — ONDANSETRON HCL 4 MG/2ML IJ SOLN
4.0000 mg | Freq: Once | INTRAMUSCULAR | Status: AC
  Administered 2012-06-01: 4 mg via INTRAVENOUS
  Filled 2012-06-01: qty 2

## 2012-06-01 NOTE — ED Notes (Signed)
Patient transported to X-ray 

## 2012-06-01 NOTE — ED Notes (Signed)
Pt had three large, loose stools.

## 2012-06-01 NOTE — H&P (Signed)
[facility] Date: 06/01/2012  Patient name: Rachel Bridges Medical record number: [id number] Date of birth: [birth year] Age: 79 y.o. Gender: female PCP: Lars Mage, MD  Medical Service: Frann Rider  Attending physician:  Dr. Doneen Poisson    1st Contact: Dr. Collier Bullock   Pager:[phone] 2nd Contact: Dr. Manson Passey   Pager:[phone] After 5 pm or weekends: 1st Contact:  Intern on call   Pager: [phone] 2nd Contact:  Resident on call  Pager: [phone]  Chief Complaint: Abd pain, N/V  History of Present Illness: Ms. Rachel Bridges is an 79 year-old woman with PMH significant for adrenal insufficiency (empty sella syndrome), monoclonal gammopathy, vitamin B12 deficiency, colon cancer (s/p hemicolectomy) who presents as transfer from [facility] with chief complaint of abdominal pain and vomiting.. Symptoms started approximately 24 hours prior to admission. She began having abdominal pain and feculent smelling emesis. Family was originally concerned because symptoms were similar to those that she had experienced in the past when she had a small bowel obstruction. Patient had been having regular BMs but none on the day of admission.  She sought medical attention at the [facility] ED. While in the emergency room, she had 3 large bowel movements and her symptoms completely resolved. Her daughter was at bedside when the BMs occurred and says that they were the same color and consistency as her normal BMs. Patient says that since having the BMs, her pain, nausea and emesis have completely resolved. She feels well now and is with no complaints.  She denies fever, hematemesis, hematochezia, melena, or headache. She says she has chronic dizziness since her diagnosis with adrenal insufficiency, but nothing worse than usual today. She has chronic peripheral edema for which she takes lasix. She had a mechanical fall a few days ago and struck her L knee and shoulder. She has noticed bruising but no pain  or decreased ROM.    Meds: Medication List   As of 06/01/2012 11:53 PM        aspirin 81 MG tablet   Take 81 mg by mouth daily.      calcium citrate-vitamin D 315-200 MG-UNIT per tablet   Commonly known as: CITRACAL+D   Take 1 tablet by mouth 2 (two) times daily.      cyanocobalamin 1000 MCG/ML injection   Commonly known as: (VITAMIN B-12)   Inject 1,000 mcg into the muscle every 30 (thirty) days. Usually on the 21st of each month      cyanocobalamin 1000 MCG/ML injection   Commonly known as: (VITAMIN B-12)   Inject 1,000 mcg into the muscle every 30 (thirty) days.      feeding supplement Liqd   Take 237 mLs by mouth daily. Vanilla.      furosemide 20 MG tablet   Commonly known as: LASIX   Take 20 mg by mouth daily.      hydrocortisone 10 MG tablet   Commonly known as: CORTEF   Take 10 mg by mouth 2 (two) times daily. Take 10mg  every morning; and 5mg  every afternoon.      ondansetron 4 MG tablet   Commonly known as: ZOFRAN   Take 4 mg by mouth as needed. Nausea.      potassium chloride 10 MEQ tablet   Commonly known as: K-DUR   Take 20 mEq by mouth 2 (two) times daily.      sertraline 50 MG tablet   Commonly known as: ZOLOFT   Take 50 mg by mouth daily.  traZODone 50 MG tablet   Commonly known as: DESYREL   TAKE 1 TABLET BY MOUTH EVERY NIGHT AT BEDTIME      ZEMPLAR 1 MCG capsule   Generic drug: paricalcitol   Take 1 mcg by mouth every Monday, Wednesday, and Friday.     Allergies: Allergies as of 06/01/2012 - Review Complete 06/01/2012  Allergen Reaction Noted  . Morphine and related Other (See Comments) 03/02/2011  . Oxycodone hcl (oxycodone hcl) Other (See Comments) 03/02/2011  . Pantoprazole (pantoprazole sodium) Nausea Only 03/02/2011   Past Medical History  Diagnosis Date  . MGUS (monoclonal gammopathy of unknown significance)      [facility]  . Anxiety   . History of colon cancer      History of bowel   obstruction. on treatment with Colace and MiraLax.  followed by Dr. Loreta Ave and Dr. Purnell Shoemaker  . Depression   . Diverticulosis of colon   . Osteopenia   . Lumbar spinal stenosis   . Atrophic vaginitis   . Renal cyst   . History of tobacco abuse     at least 64 pack year. Started about age 67 and continued until about age 79.  Marland Kitchen Dermatitis   . Hip pain      History  . Actinic keratosis   . Dementia   . Adrenal suppression     central, on chronic steroids, followed by Dr. Sharl Ma of endocrinology  . Gluten intolerance     August 2011 endoscopy with biopsy negative for celiac disease. Prior to this patient had elevated TTG IgG of 93 (normal <20). Dr Loreta Ave made clinical diagnosis of gluten intolerance based on diarrhea/symptoms from eating gluten. Needs GF diet.    Past Surgical History  Procedure Date  . Hemicolectomy      Right-sided  .  resection of ovarian cyst   . Cataract extraction      Left eye  . Knee arthroscopy      Left knee  . Appendectomy   . Carpal tunnel release   . Lumbar laminectomy   . Lumbar fusion   . Replacement total knee     left knee   No family history on file. History   Social History  . Marital Status: Widowed    Spouse Name: N/A    Number of Children: N/A  . Years of Education: N/A   Occupational History  . Not on file.   Social History Main Topics  . Smoking status: Former Smoker -- 1.0 packs/day for 24 years    Types: Cigarettes    Quit date: 08/01/1988  . Smokeless tobacco: Not on file  . Alcohol Use: No  . Drug Use: No  . Sexually Active: No   Other Topics Concern  . Not on file   Social History Narrative    Husband died of myeloma. Patient is to daughter's one of whom she lives with her.    Review of Systems: 10 pt ROS performed, pertinent positives and negatives noted in HPI  Physical Exam: Blood pressure 125/57, pulse 82, temperature 98 F (36.7 C), temperature source Oral, resp. rate 18, SpO2 98.00%. Vitals  reviewed. General: elderly, pleasant woman resting in bed, NAD HEENT: PERRL, EOMI, no scleral icterus. MMM of OP Cardiac: RRR w HR in 80s, no rubs, murmurs or gallops. No JVD appreciated Pulm: No increased WOB. Clear to auscultation bilaterally, no wheezes, rales, or rhonchi Abd: On our examination, abdomen was soft, nontender, nondistended, with  normoactive BS Ext: Bilateral pedal edema, non pitting, with poor skin turgor. Has ecchymosis over L knee, no joint effusions, pain or decreased ROM. Neuro: alert and oriented X3, cranial nerves II-XII grossly intact, strength and sensation to light touch equal in bilateral upper and lower extremities  Lab results: Basic Metabolic Panel:  Basename 06/01/12 1900  NA 144  K 4.0  CL 104  CO2 28  GLUCOSE 142*  BUN 24*  CREATININE 1.07  CALCIUM 8.2*  MG --  PHOS --   Liver Function Tests:  Basename 06/01/12 1900  AST 14  ALT 10  ALKPHOS 96  BILITOT 0.1*  PROT 6.0  ALBUMIN 2.3*    Basename 06/01/12 1900  LIPASE 25  AMYLASE --   CBC:  Basename 06/01/12 1900  WBC 9.9  NEUTROABS 8.6*  HGB 11.5*  HCT 35.2*  MCV 91.4  PLT 190   Urinalysis:  Basename 06/01/12 2109  COLORURINE YELLOW  LABSPEC 1.023  PHURINE 5.5  GLUCOSEU NEGATIVE  HGBUR NEGATIVE  BILIRUBINUR SMALL*  KETONESUR NEGATIVE  PROTEINUR NEGATIVE  UROBILINOGEN 0.2  NITRITE NEGATIVE  LEUKOCYTESUR NEGATIVE     Imaging results:  Dg Abd Acute W/chest  06/01/2012  *RADIOLOGY REPORT*  Clinical Data: Abdominal pain and distention, former smoker  ACUTE ABDOMEN SERIES (ABDOMEN 2 VIEW & CHEST 1 VIEW)  Comparison: 12/18/2011  Findings: Upper normal heart size. Mediastinal contours and pulmonary vascularity normal. Atelectasis at left lung base. Underlying emphysematous changes. No infiltrate, pleural effusion or pneumothorax.  Marked gaseous distention of bowel loops throughout abdomen including both large and small bowel loops. This is similar to that seen on previous  studies, question chronic colonic ileus. No definite bowel wall thickening or free intraperitoneal air. Few scattered air-fluid levels on upright view. Bones diffusely demineralized with evidence of prior lumbar fusion. Numerous pelvic phleboliths.  IMPRESSION: Chronic significant dilatation of the predominately colonic but a few small bowel loops as well with stool noted in rectum. Findings are more suggestive of a chronic colonic ileus rather than obstruction. No definite evidence of perforation or wall thickening. Emphysematous changes with left basilar atelectasis.   Original Report Authenticated By: Ulyses Southward, M.D.      Assessment & Plan by Problem: Ms. Vanorman is an 79 year-old woman with PMH significant for adrenal insufficiency (empty sella syndrome), monoclonal gammopathy, vitamin B12 deficiency, colon cancer (s/p hemicolectomy) who presents with abdominal pain, N/V and elevated lactic acid.   1) Abdominal pain/nausea+vomiting Patient presented with acute onset abdominal pain and vomiting approx. 24 hours prior to admission. Vomit was foul smelling but with no gross blood. Abdomen was reported as distended at [facility] ED but is soft on our exam. She has history of SBO in setting of prior hemicolectomy. Her abdominal film today shows findings consistent with chronic colonic ileus rather than acute obstruction.  Resolution of symptoms with passage of 3 BMs while in the ED would suggest that she does not have acute obstruction at this time. As patient presented with abdominal pain and lactic acid of 5.1, she was admitted for observation due to concern for mesenteric ischemia. Low suspicion for this diagnosis as patient is without any gross blood in stool or current abdominal pain.  She currently is hemodynamically stable without fever, tachycardia, or leukocytosis. Anion gap is within normal limits (12). WBC count is 9.9 with neutrophilic predominance (87%). Urinalysis does not demonstrate infection.  Ultimately suspect that constipation due to slow colonic transit time responsible for her transient symptoms.  Of note, Ms.  Perris was last hospitalized at [facility] in 11/2011 with diarrhea with elevated lactic acid. Her stool C. Diff was negative with negative stool culture and no O/P.  Prior small bowel bx negative for celiac disease. She had an MRA at the time to evaluate for chronic bowel ischemia due to elevated lactic acid and dilated colon on xray. MRA demonstrated mild atheromatous abdominal aorta with no visceral arterial stenoses/occlusions to cause mesenteric ischemia. Her symptoms improved at the time with conservative management, including IVF. - Admit for observation and IVF hydration (100cc/hr x6 hours) - Patient on prn zofran at home for nausea. Will continue. - Patient has appetite, will provide gluten-free, lactose intolerant diet - No stool studies now as Hb stable, and BM described as same as home BM - Trend lactic acid in am - am CBC and Bmet - No stool softeners/osmotic/promotility agents as patient with frequent loose stools at home.  2) Adrenal insufficiency Secondary adrenal insufficiencydue to pituitary damage. Follows with Dr. Sharl Ma, last visit October 2013. Takes hydrocortisone 10mg  qAM and 5mg  qPM at home.   No hyponatremia or hypoglycemia to suggest acute decompensated adrenal insufficiency. - Continue home dose hydrocortisone. Will not provide stress-dose steroids as patient now back to baseline and not acutely ill-appearing  3) Monoclonal Gammopathy of Undetermined Significance (IgA) Seen by oncologist (Dr. Dalene Carrow) in October. At that time, protein markers and skeletal survey were stable. She will follow-up again in 1 year. Calcium is 8.2 today (corrects to 9.6) and creatinine at baseline (1.07).  4) Severe protein calorie malnutrition Patient's albumin is 2.3, around her recent baseline. Likely multifactorial with component of gluten and lactose intolerance, gut  dysmotility, recurrent diarrhea, possible chronic intestinal pseudo-obstruction. Patient's daughter reports good appetite and po intake at home.  - Continue ensure complete daily  5) Chronic normocytic anemia Likely anemia of chronic disease. Has h/o of B12 deficiency which is treated w monthly injections, last level within normal limits. Hb stable today at baseline (11.5) w MCV 91.  - Stable, no interventions  6) Bilateral pedal edema Patient with bilateral pedal edema for which she takes Lasix 20mg  daily. She has preserved EF (65%) with grade I diastolic dysfunction. Reports interval improvement in pedal edema. - Continue lasix 20mg  daily    Dispo: Disposition is deferred at this time, awaiting improvement of current medical problems. Anticipated discharge in approximately 1day(s).   The patient does have a current PCP (GARG, ANKIT, MD), therefore {will berequiring [facility] follow-up after discharge.   The patient does not have transportation limitations that hinder transportation to clinic appointments.  Signed: Bronson Curb 06/01/2012, 11:51 PM

## 2012-06-01 NOTE — ED Notes (Signed)
Dr. Rubin Payor made aware of the BMs and delay in obtaining a urine.

## 2012-06-01 NOTE — ED Notes (Signed)
Pt unable to give urine sample at this time.  Pt is unable to urinate without having a BM as well.  Pt has had 3 loose diarrhea stools.

## 2012-06-01 NOTE — ED Notes (Addendum)
Per EMS: burp smells like fecal matter, daughter states 4-6 months pts gets bowel obstructions and also states she has loose stools.Pt abdomen is distended. 4 mg of Zofran given 20 g in left hand.

## 2012-06-01 NOTE — ED Notes (Signed)
EXB:[id number]<LK> Expected date:<BR> Expected time:<BR> Means of arrival:<BR> Comments:<BR> Ems/bowel obstruction

## 2012-06-01 NOTE — ED Notes (Signed)
[facility] called for transport. 

## 2012-06-01 NOTE — ED Provider Notes (Signed)
History     CSN: [id number]  Arrival date & time 06/01/12  1735   First MD Initiated Contact with Patient 06/01/12 1826      Chief Complaint  Patient presents with  . Constipation  . GI Problem    (Consider location/radiation/quality/duration/timing/severity/associated sxs/prior treatment) Patient is a 79 y.o. female presenting with constipation and GI illness. The history is provided by the patient.  Constipation  The current episode started today. Associated symptoms include abdominal pain, diarrhea, nausea and vomiting. Pertinent negatives include no chest pain, no headaches and no rash.  GI Problem  Associated symptoms include abdominal pain and vomiting. Pertinent negatives include no headaches.   patient again with abdominal pain and vomiting around 3 in the morning today. Abdomen is distended. Vomitus is reportedly green and mucousy it smells like stool. She's had some loose diarrhea and no normal stool. No fevers. Feels like her previous bowel obstructions. Family member states they will not do surgery she is so old it is somewhat out of medical problems. No fevers. The cough. No blood in emesis or stool. Past Medical History  Diagnosis Date  . MGUS (monoclonal gammopathy of unknown significance)      [facility]  . Anxiety   . History of colon cancer      History of bowel  obstruction. on treatment with Colace and MiraLax.  followed by Dr. Loreta Ave and Dr. Purnell Shoemaker  . Depression   . Diverticulosis of colon   . Osteopenia   . Lumbar spinal stenosis   . Atrophic vaginitis   . Renal cyst   . History of tobacco abuse     at least 64 pack year. Started about age 73 and continued until about age 37.  Marland Kitchen Dermatitis   . Hip pain      History  . Actinic keratosis   . Dementia   . Adrenal suppression     central, on chronic steroids, followed by Dr. Sharl Ma of endocrinology  . Gluten intolerance     August 2011 endoscopy with biopsy negative  for celiac disease. Prior to this patient had elevated TTG IgG of 93 (normal <20). Dr Loreta Ave made clinical diagnosis of gluten intolerance based on diarrhea/symptoms from eating gluten. Needs GF diet.     Past Surgical History  Procedure Date  . Hemicolectomy      Right-sided  .  resection of ovarian cyst   . Cataract extraction      Left eye  . Knee arthroscopy      Left knee  . Appendectomy   . Carpal tunnel release   . Lumbar laminectomy   . Lumbar fusion   . Replacement total knee     left knee    No family history on file.  History  Substance Use Topics  . Smoking status: Former Smoker -- 1.0 packs/day for 24 years    Types: Cigarettes    Quit date: 08/01/1988  . Smokeless tobacco: Not on file  . Alcohol Use: No    OB History    Grav Para Term Preterm Abortions TAB SAB Ect Mult Living                  Review of Systems  Constitutional: Positive for fatigue. Negative for activity change and appetite change.  HENT: Negative for neck stiffness.   Eyes: Negative for pain.  Respiratory: Negative for chest tightness and shortness of breath.   Cardiovascular: Negative for chest pain and  leg swelling.  Gastrointestinal: Positive for nausea, vomiting, abdominal pain, diarrhea, constipation and abdominal distention.  Genitourinary: Negative for flank pain.  Musculoskeletal: Negative for back pain.  Skin: Negative for rash.  Neurological: Negative for weakness, numbness and headaches.  Psychiatric/Behavioral: Negative for behavioral problems.    Allergies  Morphine and related; Oxycodone hcl; and Pantoprazole  Home Medications   No current outpatient prescriptions on file.  BP 125/57  Pulse 82  Temp 98 F (36.7 C) (Oral)  Resp 18  SpO2 98%  Physical Exam  Nursing note and vitals reviewed. Constitutional: She is oriented to person, place, and time. She appears well-developed and well-nourished.  HENT:  Head: Normocephalic and atraumatic.  Eyes: EOM are  normal. Pupils are equal, round, and reactive to light.  Neck: Normal range of motion. Neck supple.  Cardiovascular: Normal rate, regular rhythm and normal heart sounds.   No murmur heard. Pulmonary/Chest: Effort normal and breath sounds normal. No respiratory distress. She has no wheezes. She has no rales.  Abdominal: Soft. She exhibits distension. There is tenderness. There is no rebound and no guarding.       Hyperactive bowel sounds. No hernias palpated. Minimal tenderness.   Musculoskeletal: Normal range of motion.  Neurological: She is alert and oriented to person, place, and time. No cranial nerve deficit.  Skin: Skin is warm and dry.  Psychiatric: She has a normal mood and affect. Her speech is normal.    ED Course  Procedures (including critical care time)  Labs Reviewed  CBC WITH DIFFERENTIAL - Abnormal; Notable for the following:    RBC 3.85 (*)     Hemoglobin 11.5 (*)     HCT 35.2 (*)     Neutrophils Relative 87 (*)     Neutro Abs 8.6 (*)     Lymphocytes Relative 10 (*)     All other components within normal limits  COMPREHENSIVE METABOLIC PANEL - Abnormal; Notable for the following:    Glucose, Bld 142 (*)     BUN 24 (*)     Calcium 8.2 (*)     Albumin 2.3 (*)     Total Bilirubin 0.1 (*)     GFR calc non Af Amer 46 (*)     GFR calc Af Amer 53 (*)     All other components within normal limits  URINALYSIS, ROUTINE W REFLEX MICROSCOPIC - Abnormal; Notable for the following:    Bilirubin Urine SMALL (*)     All other components within normal limits  LACTIC ACID, PLASMA - Abnormal; Notable for the following:    Lactic Acid, Venous 5.1 (*)     All other components within normal limits  LIPASE, BLOOD   Dg Abd Acute W/chest  06/01/2012  *RADIOLOGY REPORT*  Clinical Data: Abdominal pain and distention, former smoker  ACUTE ABDOMEN SERIES (ABDOMEN 2 VIEW & CHEST 1 VIEW)  Comparison: 12/18/2011  Findings: Upper normal heart size. Mediastinal contours and pulmonary  vascularity normal. Atelectasis at left lung base. Underlying emphysematous changes. No infiltrate, pleural effusion or pneumothorax.  Marked gaseous distention of bowel loops throughout abdomen including both large and small bowel loops. This is similar to that seen on previous studies, question chronic colonic ileus. No definite bowel wall thickening or free intraperitoneal air. Few scattered air-fluid levels on upright view. Bones diffusely demineralized with evidence of prior lumbar fusion. Numerous pelvic phleboliths.  IMPRESSION: Chronic significant dilatation of the predominately colonic but a few small bowel loops as well with stool noted in  rectum. Findings are more suggestive of a chronic colonic ileus rather than obstruction. No definite evidence of perforation or wall thickening. Emphysematous changes with left basilar atelectasis.   Original Report Authenticated By: Ulyses Southward, M.D.      1. Ileus       MDM  Patient with nausea vomiting, mild diarrhea. Laboratories likely colonic ileus. She's had previous history of same. Family states that she would not get surgery if it was needed. Lactic acid is elevated. Patient had 3 large bowel movements and feels better. Her abdominal distention is improved. However with her age, comorbidities, and lactic acid 5 she'll be admitted to the outpatient clinic as an observation patient for further monitoring.        Juliet Rude. Rubin Payor, MD 06/01/12 [phone]

## 2012-06-01 NOTE — ED Notes (Signed)
Pt has pitting edema in lower extremities. Daughter states that pt is on Lasix 20 mg everyday but it is not working.

## 2012-06-02 DIAGNOSIS — K560 Paralytic ileus: Secondary | ICD-10-CM

## 2012-06-02 DIAGNOSIS — K5901 Slow transit constipation: Secondary | ICD-10-CM | POA: Diagnosis present

## 2012-06-02 LAB — BASIC METABOLIC PANEL
CO2: 23 mEq/L (ref 19–32)
Calcium: 7.4 mg/dL — ABNORMAL LOW (ref 8.4–10.5)
GFR calc non Af Amer: 45 mL/min — ABNORMAL LOW (ref >90)
Potassium: 3.3 mEq/L — ABNORMAL LOW (ref 3.5–5.1)
Sodium: 145 mEq/L (ref 135–145)

## 2012-06-02 LAB — MAGNESIUM: Magnesium: 1.7 mg/dL (ref 1.5–2.5)

## 2012-06-02 LAB — CBC
Platelets: 156 10*3/uL (ref 150–400)
RBC: 3.15 MIL/uL — ABNORMAL LOW (ref 3.87–5.11)
WBC: 6.1 10*3/uL (ref 4.0–10.5)

## 2012-06-02 MED ORDER — ENSURE COMPLETE PO LIQD
237.0000 mL | ORAL | Status: DC
Start: 2012-06-02 — End: 2012-06-02
  Administered 2012-06-02: 237 mL via ORAL

## 2012-06-02 MED ORDER — CYANOCOBALAMIN 1000 MCG/ML IJ SOLN: 1000.0000 ug | INTRAMUSCULAR | Status: DC

## 2012-06-02 MED ORDER — HYDROCORTISONE 10 MG PO TABS
10.0000 mg | ORAL_TABLET | Freq: Two times a day (BID) | ORAL | Status: DC
  Administered 2012-06-02 (×2): 10 mg via ORAL
  Filled 2012-06-02 (×3): qty 1

## 2012-06-02 MED ORDER — SERTRALINE HCL 50 MG PO TABS
50.0000 mg | ORAL_TABLET | Freq: Every day | ORAL | Status: DC
  Administered 2012-06-02: 50 mg via ORAL
  Filled 2012-06-02: qty 1

## 2012-06-02 MED ORDER — ACETAMINOPHEN 650 MG RE SUPP: 650.0000 mg | Freq: Four times a day (QID) | RECTAL | Status: DC | PRN

## 2012-06-02 MED ORDER — SODIUM CHLORIDE 0.9 % IV SOLN
INTRAVENOUS | Status: AC
  Administered 2012-06-02: 01:00:00 via INTRAVENOUS

## 2012-06-02 MED ORDER — ONDANSETRON HCL 4 MG PO TABS: 4.0000 mg | ORAL_TABLET | Freq: Three times a day (TID) | ORAL | Status: DC | PRN

## 2012-06-02 MED ORDER — SODIUM CHLORIDE 0.9 % IV BOLUS (SEPSIS)
250.0000 mL | Freq: Once | INTRAVENOUS | Status: AC
  Administered 2012-06-02: 250 mL via INTRAVENOUS

## 2012-06-02 MED ORDER — ACETAMINOPHEN 325 MG PO TABS: 650.0000 mg | ORAL_TABLET | Freq: Four times a day (QID) | ORAL | Status: DC | PRN

## 2012-06-02 MED ORDER — TRAZODONE HCL 50 MG PO TABS
50.0000 mg | ORAL_TABLET | Freq: Every day | ORAL | Status: DC
  Administered 2012-06-02: 50 mg via ORAL
  Filled 2012-06-02 (×2): qty 1

## 2012-06-02 MED ORDER — ENOXAPARIN SODIUM 30 MG/0.3ML ~~LOC~~ SOLN
30.0000 mg | SUBCUTANEOUS | Status: DC
  Administered 2012-06-02: 30 mg via SUBCUTANEOUS
  Filled 2012-06-02: qty 0.3

## 2012-06-02 MED ORDER — CALCIUM CARBONATE-VITAMIN D 500-200 MG-UNIT PO TABS
1.0000 | ORAL_TABLET | Freq: Two times a day (BID) | ORAL | Status: DC
  Administered 2012-06-02: 1 via ORAL
  Filled 2012-06-02 (×3): qty 1

## 2012-06-02 MED ORDER — HYDROCORTISONE 10 MG PO TABS
10.0000 mg | ORAL_TABLET | Freq: Once | ORAL | Status: AC
  Administered 2012-06-02: 10 mg via ORAL
  Filled 2012-06-02: qty 1

## 2012-06-02 MED ORDER — ASPIRIN 81 MG PO CHEW
81.0000 mg | CHEWABLE_TABLET | Freq: Every day | ORAL | Status: DC
  Administered 2012-06-02: 81 mg via ORAL
  Filled 2012-06-02: qty 1

## 2012-06-02 MED ORDER — FUROSEMIDE 20 MG PO TABS
20.0000 mg | ORAL_TABLET | Freq: Every day | ORAL | Status: DC
  Filled 2012-06-02: qty 1

## 2012-06-02 MED ORDER — PARICALCITOL 1 MCG PO CAPS: 1.0000 ug | ORAL_CAPSULE | ORAL | Status: DC

## 2012-06-02 MED ORDER — POTASSIUM CHLORIDE ER 10 MEQ PO TBCR
20.0000 meq | EXTENDED_RELEASE_TABLET | Freq: Two times a day (BID) | ORAL | Status: DC
  Administered 2012-06-02: 20 meq via ORAL
  Filled 2012-06-02 (×2): qty 2

## 2012-06-02 MED ORDER — ENSURE COMPLETE PO LIQD: 237.0000 mL | ORAL | Status: DC

## 2012-06-02 NOTE — H&P (Signed)
Internal Medicine Attending Admission Note Date: 06/02/2012  Patient name: Rachel Bridges Medical record number: [id number] Date of birth: [birth year] Age: 79 y.o. Gender: female  I saw and evaluated the patient. I reviewed the resident's note and I agree with the resident's findings and plan as documented in the resident's note.  Chief Complaint(s): Nausea, vomiting, abdominal pain, abdominal distention x 24 hours.  History - key components related to admission:  Rachel Bridges is an 79 year old woman with a history of colon cancer status post a right hemicolectomy, chronic colonic ileus, adrenal insufficiency secondary to empty sella syndrome, monoclonal gammopathy of unknown significance, and vitamin B12 deficiency who presents with a 24-hour history of nausea, vomiting, abdominal distention, and abdominal pain. She has a history of intermittent ileus/obstruction. When her symptoms did not resolve her family brought her to the emergency department. While in the emergency department she had 3 large bowel movements with complete resolution of her symptoms. She was admitted to the internal medicine teaching service for observation.   She's done well overnight and is without any complaints this morning. Specifically, she denies any nausea, vomiting, abdominal pain, or abdominal distention. She has not had a bowel movement as of this morning. We discussed further evaluation for her chronic colonic ileus and she deferred stating that at her age it is best that she just deal with the issue. She is interested in going home today given that she feels back to baseline.  Physical Exam - key components related to admission:  Filed Vitals:   06/01/12 2157 06/01/12 2317 06/02/12 0600 06/02/12 1025  BP: 119/69 125/57 88/40 108/65  Pulse: 80 82 74 60  Temp: 97.8 F (36.6 C) 98 F (36.7 C) 98.2 F (36.8 C) 97.4 F (36.3 C)  TempSrc: Oral   Oral  Resp: 18 18 18 16   Height:  5\' 7"  (1.702 m)    SpO2: 94% 98%  93% 95%   General: Well-developed, well-nourished, woman lying comfortably in bed in no acute distress. Lungs: Clear to auscultation bilaterally without wheezes. Heart: Regular rate and rhythm without murmurs.:  Abdomen: Soft, nontender, high-pitched tinkling bowel sounds, without guarding or rebound. Extremities: 1+ pitting edema bilaterally.  Lab results:  Basic Metabolic Panel:  Basename 06/02/12 0445 06/01/12 1900  NA 145 144  K 3.3* 4.0  CL 113* 104  CO2 23 28  GLUCOSE 98 142*  BUN 24* 24*  CREATININE 1.08 1.07  CALCIUM 7.4* 8.2*  MG 1.7 --  PHOS -- --   Liver Function Tests:  Basename 06/01/12 1900  AST 14  ALT 10  ALKPHOS 96  BILITOT 0.1*  PROT 6.0  ALBUMIN 2.3*    Basename 06/01/12 1900  LIPASE 25  AMYLASE --   CBC:  Basename 06/02/12 0445 06/01/12 1900  WBC 6.1 9.9  NEUTROABS -- 8.6*  HGB 9.5* 11.5*  HCT 29.1* 35.2*  MCV 92.4 91.4  PLT 156 190   Urine Drug Screen:  Small bilirubin  Imaging results:  Dg Abd Acute W/chest  06/01/2012  *RADIOLOGY REPORT*  Clinical Data: Abdominal pain and distention, former smoker  ACUTE ABDOMEN SERIES (ABDOMEN 2 VIEW & CHEST 1 VIEW)  Comparison: 12/18/2011  Findings: Upper normal heart size. Mediastinal contours and pulmonary vascularity normal. Atelectasis at left lung base. Underlying emphysematous changes. No infiltrate, pleural effusion or pneumothorax.  Marked gaseous distention of bowel loops throughout abdomen including both large and small bowel loops. This is similar to that seen on previous studies, question chronic colonic ileus. No definite  bowel wall thickening or free intraperitoneal air. Few scattered air-fluid levels on upright view. Bones diffusely demineralized with evidence of prior lumbar fusion. Numerous pelvic phleboliths.  IMPRESSION: Chronic significant dilatation of the predominately colonic but a few small bowel loops as well with stool noted in rectum. Findings are more suggestive of a chronic  colonic ileus rather than obstruction. No definite evidence of perforation or wall thickening. Emphysematous changes with left basilar atelectasis.   Original Report Authenticated By: Ulyses Southward, M.D.    Assessment & Plan by Problem:  Rachel Bridges is an 79 year old woman with a history of chronic colonic ileus who presents with nausea, vomiting, abdominal bloating, and abdominal pain which was relieved with 3 bowel movements in the emergency department. The symptoms are similar to previous episodes. She's not interested in further evaluation for this disorder given her age.  1) chronic colonic ileus: We have continued supportive care through the night and the patient feels much improved this morning without any nausea, vomiting, abdominal pain, or abdominal distention. No obvious etiology was found for her chronic colonic ileus. She will be discharged home today with followup in the [facility].

## 2012-06-02 NOTE — Discharge Summary (Signed)
Internal Medicine Teaching [facility] Discharge Note  Name: Rachel Bridges MRN: [id number] DOB: [birth year] 79 y.o.  Date of Admission: 06/01/2012  5:35 PM Date of Discharge: 06/02/2012 Attending Physician: No att. providers found  Discharge Diagnosis: Principal Problem:  *Constipation due to slow transit  possible small bowel obstruction  Discharge Medications:   Medication List     As of 06/03/2012  3:15 PM    STOP taking these medications         traZODone 50 MG tablet   Commonly known as: DESYREL      TAKE these medications         aspirin 81 MG tablet   Take 81 mg by mouth daily.      calcium citrate-vitamin D 315-200 MG-UNIT per tablet   Commonly known as: CITRACAL+D   Take 1 tablet by mouth 2 (two) times daily.      cyanocobalamin 1000 MCG/ML injection   Commonly known as: (VITAMIN B-12)   Inject 1,000 mcg into the muscle every 30 (thirty) days. Usually on the 21st of each month      cyanocobalamin 1000 MCG/ML injection   Commonly known as: (VITAMIN B-12)   Inject 1,000 mcg into the muscle every 30 (thirty) days.      feeding supplement Liqd   Take 237 mLs by mouth daily. Vanilla.      furosemide 20 MG tablet   Commonly known as: LASIX   Take 20 mg by mouth daily.      hydrocortisone 10 MG tablet   Commonly known as: CORTEF   Take 10 mg by mouth 2 (two) times daily. Take 10mg  every morning; and 5mg  every afternoon.      ondansetron 4 MG tablet   Commonly known as: ZOFRAN   Take 4 mg by mouth as needed. Nausea.      potassium chloride 10 MEQ tablet   Commonly known as: K-DUR   Take 20 mEq by mouth 2 (two) times daily.      sertraline 50 MG tablet   Commonly known as: ZOLOFT   Take 50 mg by mouth daily.      ZEMPLAR 1 MCG capsule   Generic drug: paricalcitol   Take 1 mcg by mouth every Monday, Wednesday, and Friday.         Disposition and follow-up:   Rachel Bridges was discharged from [facility] in stable  condition.  At the hospital follow up visit please address the following:  -Followup on patient's abdominal pain, nausea, vomiting -Continue to maximize nutrition with Ensure complete -Repeat CBC as an outpatient to follow her chronic normocytic anemia   Follow-up Appointments:  Discharge Orders    Future Appointments: Provider: Department: Dept Phone: Center:   02/17/2013 1:00 PM [facility] [phone] [place]   02/17/2013 2:00 PM Mauri [place] [facility] [phone] None   02/24/2013 10:00 AM Laurice Record, MD [place] [facility] [phone] None     Future Orders Please Complete By Expires   Increase activity slowly      Discharge instructions      Comments:   Please seek medical attention if you develop nausea, vomiting, abdominal pain, or if you go for an extended period of time without passing gas or have a bowel movement.   Call MD for:  persistant nausea and vomiting      Call MD for:  severe uncontrolled pain  Call MD for:  extreme fatigue          Procedures Performed:  Dg Abd Acute W/chest  06/01/2012  *RADIOLOGY REPORT*  Clinical Data: Abdominal pain and distention, former smoker  ACUTE ABDOMEN SERIES (ABDOMEN 2 VIEW & CHEST 1 VIEW)  Comparison: 12/18/2011  Findings: Upper normal heart size. Mediastinal contours and pulmonary vascularity normal. Atelectasis at left lung base. Underlying emphysematous changes. No infiltrate, pleural effusion or pneumothorax.  Marked gaseous distention of bowel loops throughout abdomen including both large and small bowel loops. This is similar to that seen on previous studies, question chronic colonic ileus. No definite bowel wall thickening or free intraperitoneal air. Few scattered air-fluid levels on upright view. Bones diffusely demineralized with evidence of prior lumbar fusion. Numerous pelvic phleboliths.  IMPRESSION:  Chronic significant dilatation of the predominately colonic but a few small bowel loops as well with stool noted in rectum. Findings are more suggestive of a chronic colonic ileus rather than obstruction. No definite evidence of perforation or wall thickening. Emphysematous changes with left basilar atelectasis.   Original Report Authenticated By: Ulyses Southward, M.D.       Admission HPI:  Ms. Rachel Bridges is an 79 year-old woman with PMH significant for adrenal insufficiency (empty sella syndrome), monoclonal gammopathy, vitamin B12 deficiency, colon cancer (s/p hemicolectomy) who presents as transfer from [facility] with chief complaint of abdominal pain and vomiting..  Symptoms started approximately 24 hours prior to admission. She began having abdominal pain and feculent smelling emesis. Family was originally concerned because symptoms were similar to those that she had experienced in the past when she had a small bowel obstruction. Patient had been having regular BMs but none on the day of admission. She sought medical attention at the [facility] ED. While in the emergency room, she had 3 large bowel movements and her symptoms completely resolved. Her daughter was at bedside when the BMs occurred and says that they were the same color and consistency as her normal BMs. Patient says that since having the BMs, her pain, nausea and emesis have completely resolved. She feels well now and is with no complaints.  She denies fever, hematemesis, hematochezia, melena, or headache. She says she has chronic dizziness since her diagnosis with adrenal insufficiency, but nothing worse than usual today. She has chronic peripheral edema for which she takes lasix. She had a mechanical fall a few days ago and struck her L knee and shoulder. She has noticed bruising but no pain or decreased ROM.    Hospital Course by problem list: Principal Problem:  *Constipation due to slow transit  1) Abdominal  pain/nausea+vomiting  Patient presented with acute onset abdominal pain and vomiting approx. 24 hours prior to admission. Vomit was foul smelling but with no gross blood. Abdomen was reported as distended at [facility] ED but was soft on our exam. She has history of SBO in setting of prior hemicolectomy. Her abdominal film from admission shows findings consistent with chronic colonic ileus rather than acute obstruction. Resolution of symptoms with passage of 3 BMs while in the ED would suggest that she did not have acute obstruction. As patient presented with abdominal pain and lactic acid of 5.1, she was admitted for observation due to concern for mesenteric ischemia. Low suspicion for this diagnosis as patient is without any gross blood in stool or current abdominal pain.  She remained hemodynamically stable without fever, tachycardia, or leukocytosis. Anion gap was within normal limits (12). WBC count 9.9 with neutrophilic  predominance (87%). Urinalysis does not demonstrate infection. Ultimately suspect that constipation due to slow colonic transit time responsible for her transient symptoms.  On the day of discharge, patient's pain had resolved and she had consumed 2 meals without any nausea, vomiting, or abdominal pain. She did pass a soft bowel movement the day of discharge. Red flag symptoms were discussed with the patient's daughters and they know to seek medical attention if needed. Patient to follow up in [facility].  2) Adrenal insufficiency  Secondary adrenal insufficiencydue to pituitary damage. Follows with Dr. Sharl Ma, last visit October 2013. Takes hydrocortisone 10mg  qAM and 5mg  qPM at home. No hyponatremia or hypoglycemia to suggest acute decompensated adrenal insufficiency. Patient was given an extra dose of cortef before discharge.    Discharge Vitals:  BP 108/65  Pulse 62  Temp 97.3 F (36.3 C) (Oral)  Resp 16  Ht 5\' 7"  (1.702 m)  SpO2 100%  Discharge Labs:  Results for orders placed during the  hospital encounter of 06/01/12 (from the past 24 hour(s))  LACTIC ACID, PLASMA     Status: Abnormal   Collection Time   06/01/12  6:35 PM      Component Value Range   Lactic Acid, Venous 5.1 (*) 0.5 - 2.2 mmol/L  CBC WITH DIFFERENTIAL     Status: Abnormal   Collection Time   06/01/12  7:00 PM      Component Value Range   WBC 9.9  4.0 - 10.5 K/uL   RBC 3.85 (*) 3.87 - 5.11 MIL/uL   Hemoglobin 11.5 (*) 12.0 - 15.0 g/dL   HCT 16.1 (*) 09.6 - 04.5 %   MCV 91.4  78.0 - 100.0 fL   MCH 29.9  26.0 - 34.0 pg   MCHC 32.7  30.0 - 36.0 g/dL   RDW 40.9  81.1 - 91.4 %   Platelets 190  150 - 400 K/uL   Neutrophils Relative 87 (*) 43 - 77 %   Neutro Abs 8.6 (*) 1.7 - 7.7 K/uL   Lymphocytes Relative 10 (*) 12 - 46 %   Lymphs Abs 0.9  0.7 - 4.0 K/uL   Monocytes Relative 3  3 - 12 %   Monocytes Absolute 0.3  0.1 - 1.0 K/uL   Eosinophils Relative 0  0 - 5 %   Eosinophils Absolute 0.0  0.0 - 0.7 K/uL   Basophils Relative 0  0 - 1 %   Basophils Absolute 0.0  0.0 - 0.1 K/uL  COMPREHENSIVE METABOLIC PANEL     Status: Abnormal   Collection Time   06/01/12  7:00 PM      Component Value Range   Sodium 144  135 - 145 mEq/L   Potassium 4.0  3.5 - 5.1 mEq/L   Chloride 104  96 - 112 mEq/L   CO2 28  19 - 32 mEq/L   Glucose, Bld 142 (*) 70 - 99 mg/dL   BUN 24 (*) 6 - 23 mg/dL   Creatinine, Ser 7.82  0.50 - 1.10 mg/dL   Calcium 8.2 (*) 8.4 - 10.5 mg/dL   Total Protein 6.0  6.0 - 8.3 g/dL   Albumin 2.3 (*) 3.5 - 5.2 g/dL   AST 14  0 - 37 U/L   ALT 10  0 - 35 U/L   Alkaline Phosphatase 96  39 - 117 U/L   Total Bilirubin 0.1 (*) 0.3 - 1.2 mg/dL   GFR calc non Af Amer 46 (*) >90 mL/min   GFR calc  Af Amer 53 (*) >90 mL/min  LIPASE, BLOOD     Status: Normal   Collection Time   06/01/12  7:00 PM      Component Value Range   Lipase 25  11 - 59 U/L  URINALYSIS, ROUTINE W REFLEX MICROSCOPIC     Status: Abnormal   Collection Time   06/01/12  9:09 PM      Component Value Range   Color, Urine YELLOW  YELLOW    APPearance CLEAR  CLEAR   Specific Gravity, Urine 1.023  1.005 - 1.030   pH 5.5  5.0 - 8.0   Glucose, UA NEGATIVE  NEGATIVE mg/dL   Hgb urine dipstick NEGATIVE  NEGATIVE   Bilirubin Urine SMALL (*) NEGATIVE   Ketones, ur NEGATIVE  NEGATIVE mg/dL   Protein, ur NEGATIVE  NEGATIVE mg/dL   Urobilinogen, UA 0.2  0.0 - 1.0 mg/dL   Nitrite NEGATIVE  NEGATIVE   Leukocytes, UA NEGATIVE  NEGATIVE  BASIC METABOLIC PANEL     Status: Abnormal   Collection Time   06/02/12  4:45 AM      Component Value Range   Sodium 145  135 - 145 mEq/L   Potassium 3.3 (*) 3.5 - 5.1 mEq/L   Chloride 113 (*) 96 - 112 mEq/L   CO2 23  19 - 32 mEq/L   Glucose, Bld 98  70 - 99 mg/dL   BUN 24 (*) 6 - 23 mg/dL   Creatinine, Ser 1.61  0.50 - 1.10 mg/dL   Calcium 7.4 (*) 8.4 - 10.5 mg/dL   GFR calc non Af Amer 45 (*) >90 mL/min   GFR calc Af Amer 53 (*) >90 mL/min  CBC     Status: Abnormal   Collection Time   06/02/12  4:45 AM      Component Value Range   WBC 6.1  4.0 - 10.5 K/uL   RBC 3.15 (*) 3.87 - 5.11 MIL/uL   Hemoglobin 9.5 (*) 12.0 - 15.0 g/dL   HCT 09.6 (*) 04.5 - 40.9 %   MCV 92.4  78.0 - 100.0 fL   MCH 30.2  26.0 - 34.0 pg   MCHC 32.6  30.0 - 36.0 g/dL   RDW 81.1  91.4 - 78.2 %   Platelets 156  150 - 400 K/uL  MAGNESIUM     Status: Normal   Collection Time   06/02/12  4:45 AM      Component Value Range   Magnesium 1.7  1.5 - 2.5 mg/dL    Signed: Denton Ar 06/02/2012, 4:40 PM   Time Spent on Discharge: 30 minutes Services Ordered on Discharge: None Equipment Ordered on Discharge: None

## 2012-06-02 NOTE — Progress Notes (Signed)
Patient discharged in stable condition via wheelchair to home. Discharge instructions and prescriptions were given and explained. 

## 2012-06-02 NOTE — Progress Notes (Signed)
Subjective: Rachel Bridges is feeling much better this morning. She had several large volume BMs in the ED last night and her pain has been well controlled since then. She denies any nausea, vomiting, belly pain, fever, chills. Denies abdominal distention. Does not remember passing gas this morning yet. Today, she is hungry and eating breakfast. She has not had a bowel movement yet.   Objective: Vital signs in last 24 hours: Filed Vitals:   06/01/12 2057 06/01/12 2157 06/01/12 2317 06/02/12 0600  BP: 123/66 119/69 125/57 88/40  Pulse: 82 80 82 74  Temp:  97.8 F (36.6 C) 98 F (36.7 C) 98.2 F (36.8 C)  TempSrc:  Oral    Resp: 18 18 18 18   Height:   5\' 7"  (1.702 m)   SpO2: 91% 94% 98% 93%   Weight change:   Intake/Output Summary (Last 24 hours) at 06/02/12 1610 Last data filed at 06/02/12 0600  Gross per 24 hour  Intake   1200 ml  Output      3 ml  Net   1197 ml    Physical Exam Blood pressure 108/65, pulse 62, temperature 97.3 F (36.3 C), temperature source Oral, resp. rate 16, height 5\' 7"  (1.702 m), SpO2 100.00%. General:  No acute distress, alert and oriented x 3, well-appearing  HEENT:  PERRL, EOMI, moist mucous membranes Cardiovascular:  Regular rate and rhythm, no murmurs Respiratory:  Clear to auscultation bilaterally, no wheezes, rales, or rhonchi Abdomen:  Soft, nondistended, nontender, + high pitched bowel sounds, no rebound or guarding Extremities:  Warm and well-perfused, 1+ bl edema Skin: Warm, dry, no rashes Neuro: Not anxious appearing, no depressed mood, normal affect  Lab Results: Basic Metabolic Panel:  Lab 06/02/12 9604 06/01/12 1900  NA 145 144  K 3.3* 4.0  CL 113* 104  CO2 23 28  GLUCOSE 98 142*  BUN 24* 24*  CREATININE 1.08 1.07  CALCIUM 7.4* 8.2*  MG -- --  PHOS -- --   Liver Function Tests:  Lab 06/01/12 1900  AST 14  ALT 10  ALKPHOS 96  BILITOT 0.1*  PROT 6.0  ALBUMIN 2.3*    Lab 06/01/12 1900  LIPASE 25  AMYLASE --    No results found for this basename: AMMONIA:2 in the last 168 hours CBC:  Lab 06/02/12 0445 06/01/12 1900  WBC 6.1 9.9  NEUTROABS -- 8.6*  HGB 9.5* 11.5*  HCT 29.1* 35.2*  MCV 92.4 91.4  PLT 156 190   Urinalysis:  Lab 06/01/12 2109  COLORURINE YELLOW  LABSPEC 1.023  PHURINE 5.5  GLUCOSEU NEGATIVE  HGBUR NEGATIVE  BILIRUBINUR SMALL*  KETONESUR NEGATIVE  PROTEINUR NEGATIVE  UROBILINOGEN 0.2  NITRITE NEGATIVE  LEUKOCYTESUR NEGATIVE    Studies/Results: Dg Abd Acute W/chest  06/01/2012  *RADIOLOGY REPORT*  Clinical Data: Abdominal pain and distention, former smoker  ACUTE ABDOMEN SERIES (ABDOMEN 2 VIEW & CHEST 1 VIEW)  Comparison: 12/18/2011  Findings: Upper normal heart size. Mediastinal contours and pulmonary vascularity normal. Atelectasis at left lung base. Underlying emphysematous changes. No infiltrate, pleural effusion or pneumothorax.  Marked gaseous distention of bowel loops throughout abdomen including both large and small bowel loops. This is similar to that seen on previous studies, question chronic colonic ileus. No definite bowel wall thickening or free intraperitoneal air. Few scattered air-fluid levels on upright view. Bones diffusely demineralized with evidence of prior lumbar fusion. Numerous pelvic phleboliths.  IMPRESSION: Chronic significant dilatation of the predominately colonic but a few small bowel loops as  well with stool noted in rectum. Findings are more suggestive of a chronic colonic ileus rather than obstruction. No definite evidence of perforation or wall thickening. Emphysematous changes with left basilar atelectasis.   Original Report Authenticated By: Ulyses Southward, M.D.    Medications:  Scheduled Meds:   . aspirin  81 mg Oral Daily  . calcium-vitamin D  1 tablet Oral BID WC  . cyanocobalamin  1,000 mcg Intramuscular Q30 days  . enoxaparin (LOVENOX) injection  30 mg Subcutaneous Q24H  . feeding supplement  237 mL Oral Q24H  . furosemide  20 mg Oral  Daily  . hydrocortisone  10 mg Oral BID  . paricalcitol  1 mcg Oral Q M,W,F  . potassium chloride  20 mEq Oral BID  . sertraline  50 mg Oral Daily  . sodium chloride  250 mL Intravenous Once  . traZODone  50 mg Oral QHS   Continuous Infusions:  PRN Meds:.acetaminophen, acetaminophen, ondansetron  Assessment/Plan:   [name] is an 79 year-old woman with PMH significant for adrenal insufficiency (empty sella syndrome), monoclonal gammopathy, vitamin B12 deficiency, colon cancer (s/p hemicolectomy) who presents with abdominal pain, N/V and elevated lactic acid.    1) Abdominal pain/nausea+vomiting  Patient presented with acute onset abdominal pain and vomiting approx. 24 hours prior to admission. Vomit was foul smelling but with no gross blood. Abdomen was reported as distended at [facility] ED but is soft on our exam. She has history of SBO in setting of prior hemicolectomy. Her abdominal film today shows findings consistent with chronic colonic ileus rather than acute obstruction.  Resolution of symptoms with passage of 3 BMs while in the ED would suggest that she does not have acute obstruction at this time. As patient presented with abdominal pain and lactic acid of 5.1, she was admitted for observation due to concern for mesenteric ischemia. Low suspicion for this diagnosis as patient is without any gross blood in stool or current abdominal pain.  She currently is hemodynamically stable without fever, tachycardia, or leukocytosis. Anion gap is within normal limits (12). WBC count is 9.9 with neutrophilic predominance (87%). Urinalysis does not demonstrate infection. Ultimately suspect that constipation due to slow colonic transit time responsible for her transient symptoms.  Of note, [name] was last hospitalized at [facility] in 11/2011 with diarrhea with elevated lactic acid. Her stool C. Diff was negative with negative stool culture and no O/P. Prior small bowel bx negative for celiac disease. She had  an MRA at the time to evaluate for chronic bowel ischemia due to elevated lactic acid and dilated colon on xray. MRA demonstrated mild atheromatous abdominal aorta with no visceral arterial stenoses/occlusions to cause mesenteric ischemia. Her symptoms improved at the time with conservative management, including IVF.   2/4: pain now resolved, patient hungry and observed for hours after 2 meals without any nausea, vomiting, pain. She passed another soft bowel movement this afternoon.  -discussed red flag symptoms with pt and her daughters -f/u with GI as scheduled  2) Adrenal insufficiency  Secondary adrenal insufficiencydue to pituitary damage. Follows with Dr. Sharl Ma, last visit October 2013. Takes hydrocortisone 10mg  qAM and 5mg  qPM at home. No hyponatremia or hypoglycemia to suggest acute decompensated adrenal insufficiency.  - gave extra dose of cortef today  3) Monoclonal Gammopathy of Undetermined Significance (IgA)  Seen by oncologist (Dr. Dalene Carrow) in October. At that time, protein markers and skeletal survey were stable. She will follow-up again in 1 year. Calcium is 8.2 today (corrects  to 9.6) and creatinine at baseline (1.07).   4) Severe protein calorie malnutrition  Patient's albumin is 2.3, around her recent baseline. Likely multifactorial with component of gluten and lactose intolerance, gut dysmotility, recurrent diarrhea, possible chronic intestinal pseudo-obstruction. Patient's daughter reports good appetite and po intake at home.  - Continue ensure complete daily   5) Chronic normocytic anemia  Likely anemia of chronic disease. Has h/o of B12 deficiency which is treated w monthly injections, last level within normal limits. Hb stable today at baseline (11.5) w MCV 91. Hgb 9.5 today after IVF, likely hemodilution as all cells decreased. Patient without signs of active bleeding - repeat CBC as outpatient  6) Bilateral pedal edema  Patient with bilateral pedal edema for which she  takes Lasix 20mg  daily. She has preserved EF (65%) with grade I diastolic dysfunction. Reports interval improvement in pedal edema.  - Continue lasix 20mg  daily   Dispo:  Patient had another BM and tolerated PO diet. No pain. Ready for dc. The patient does have a current PCP (GARG, ANKIT, MD), therefore {will berequiring [facility]PC follow-up after discharge.  The patient does not have transportation limitations that hinder transportation to clinic appointments    LOS: 1 day   [place] Ar 06/02/2012, 7:22 AM

## 2012-06-13 ENCOUNTER — Other Ambulatory Visit: Payer: Self-pay | Admitting: Internal Medicine

## 2012-06-15 ENCOUNTER — Encounter: Payer: Self-pay | Admitting: Internal Medicine

## 2012-06-15 ENCOUNTER — Ambulatory Visit (INDEPENDENT_AMBULATORY_CARE_PROVIDER_SITE_OTHER): Payer: Medicare Other | Admitting: Internal Medicine

## 2012-06-15 ENCOUNTER — Encounter: Payer: Medicare Other | Admitting: Internal Medicine

## 2012-06-15 VITALS — BP 103/64 | HR 67 | Temp 97.0°F | Ht 66.5 in | Wt 123.6 lb

## 2012-06-15 DIAGNOSIS — E538 Deficiency of other specified B group vitamins: Secondary | ICD-10-CM

## 2012-06-15 DIAGNOSIS — D472 Monoclonal gammopathy: Secondary | ICD-10-CM

## 2012-06-15 DIAGNOSIS — F3289 Other specified depressive episodes: Secondary | ICD-10-CM

## 2012-06-15 DIAGNOSIS — D539 Nutritional anemia, unspecified: Secondary | ICD-10-CM

## 2012-06-15 DIAGNOSIS — R609 Edema, unspecified: Secondary | ICD-10-CM

## 2012-06-15 DIAGNOSIS — R600 Localized edema: Secondary | ICD-10-CM

## 2012-06-15 DIAGNOSIS — F329 Major depressive disorder, single episode, unspecified: Secondary | ICD-10-CM

## 2012-06-15 LAB — CBC
Hemoglobin: 11.5 g/dL — ABNORMAL LOW (ref 12.0–15.0)
RBC: 3.87 MIL/uL (ref 3.87–5.11)

## 2012-06-15 MED ORDER — HYDROCORTISONE 10 MG PO TABS: ORAL_TABLET | ORAL | Status: DC

## 2012-06-15 MED ORDER — SERTRALINE HCL 50 MG PO TABS: 50.0000 mg | ORAL_TABLET | Freq: Every day | ORAL | Status: DC

## 2012-06-15 MED ORDER — FUROSEMIDE 20 MG PO TABS: 20.0000 mg | ORAL_TABLET | Freq: Every day | ORAL | Status: DC

## 2012-06-15 MED ORDER — ONDANSETRON HCL 4 MG PO TABS: 4.0000 mg | ORAL_TABLET | Freq: Three times a day (TID) | ORAL | Status: DC | PRN

## 2012-06-15 MED ORDER — CYANOCOBALAMIN 1000 MCG/ML IJ SOLN: 1000.0000 ug | INTRAMUSCULAR | Status: DC

## 2012-06-15 NOTE — Assessment & Plan Note (Signed)
Check CBC today.  

## 2012-06-15 NOTE — Assessment & Plan Note (Signed)
Zoloft increased to 50 mg daily while inpatient. Patient's medication refilled today.

## 2012-06-15 NOTE — Patient Instructions (Signed)
Please take all your medications as prescribed. Follow up in 3 moths.

## 2012-06-15 NOTE — Progress Notes (Signed)
  Subjective:    Patient ID: Rachel Bridges, female    DOB: January 30, 1925, 79 y.o.   MRN: [id number]  HPI Patient is 79 year old female with past medical history most significant for adrenal insufficiency, monoclonal gammopathy, vitamin B12 deficiency, colon cancer status post hemicolectomy who presents today for a hospital followup.  Patient complains of increased swelling in her legs since last 1-2 months which is associated with pain. The swelling is minimal in the morning and increased in evenings. The daughter thinks that increased Lasix dose may help.  Patient complains of pain in the neck ever since she had a fall 4 weeks ago. No restrictions on neck movements.  No other complaints.    Review of Systems  Constitutional: Negative for fever, activity change and appetite change.  HENT: Positive for neck pain. Negative for sore throat and neck stiffness.   Respiratory: Negative for cough and shortness of breath.   Cardiovascular: Positive for leg swelling. Negative for chest pain.  Gastrointestinal: Positive for abdominal distention. Negative for nausea, abdominal pain, diarrhea and constipation.  Genitourinary: Negative for frequency, hematuria and difficulty urinating.  Skin: Positive for rash.  Neurological: Negative for dizziness and headaches.  Psychiatric/Behavioral: Negative for suicidal ideas and behavioral problems.       Objective:   Physical Exam  Constitutional: She is oriented to person, place, and time. She appears well-developed and well-nourished.  HENT:  Head: Normocephalic and atraumatic.  Eyes: Conjunctivae and EOM are normal. Pupils are equal, round, and reactive to light. No scleral icterus.  Neck: Normal range of motion. Neck supple. No JVD present. No thyromegaly present.  Cardiovascular: Normal rate, regular rhythm, normal heart sounds and intact distal pulses.  Exam reveals no gallop and no friction rub.   No murmur heard. Pulmonary/Chest: Effort normal  and breath sounds normal. No respiratory distress. She has no wheezes. She has no rales.  Abdominal: Soft. Bowel sounds are normal. She exhibits distension. She exhibits no mass. There is no tenderness. There is no rebound and no guarding.  Musculoskeletal: Normal range of motion. She exhibits edema (2+ pitting edema bilaterally). She exhibits no tenderness.  Lymphadenopathy:    She has no cervical adenopathy.  Neurological: She is alert and oriented to person, place, and time.  Skin: Skin is warm and dry. Rash noted.  Psychiatric: She has a normal mood and affect. Her behavior is normal.          Assessment & Plan:

## 2012-06-15 NOTE — Assessment & Plan Note (Signed)
Increase the dose of lasix as needed when swelling is increased then usual for 5 days.

## 2012-06-23 ENCOUNTER — Telehealth: Payer: Self-pay | Admitting: Internal Medicine

## 2012-06-23 ENCOUNTER — Encounter: Payer: Self-pay | Admitting: Internal Medicine

## 2012-06-23 NOTE — Telephone Encounter (Signed)
left message gv d/t for appt..ask pt to call back and verf. the appt. also informed pt i will send out a letter and cal...td

## 2012-08-04 ENCOUNTER — Encounter: Payer: Self-pay | Admitting: Internal Medicine

## 2012-08-04 DIAGNOSIS — R269 Unspecified abnormalities of gait and mobility: Secondary | ICD-10-CM | POA: Insufficient documentation

## 2012-09-01 ENCOUNTER — Telehealth: Payer: Self-pay | Admitting: Internal Medicine

## 2012-09-01 NOTE — Telephone Encounter (Signed)
I was called by patient's daughter at 23:38 PM. Her daughter reported that patient had a fall two days ago without significant injury. Today patient was noticed to have a huge knot over her right abdomen. Patient has mild nausea and belching, but has no fever or chills, no vomiting or diarrhea. No dysuria or other symptoms for UTI. She took some over the counter Gas-X which relieved her belching, but the knot is still there. It is tender to palpate. Patient had hx of several abdominal surgeries in the past, including hysterectomy and cholecystectomy. Based on what her daughter described to me, I feel like that patient may have a ventral hernia, but I am not very sure without doing physical examination. I told her daughter that it is very difficult to assess patient on the phone and advised her to take patient to ED immediately. Her daughter verbally understood and agreed to do so.  Lorretta Harp, MD PGY2, Internal Medicine Teaching Service Pager: [phone]

## 2012-09-02 ENCOUNTER — Emergency Department (HOSPITAL_COMMUNITY)
Admission: EM | Admit: 2012-09-02 | Discharge: 2012-09-02 | Disposition: A | Discharge status: Home / Self Care | Payer: Medicare Other | Attending: Emergency Medicine | Admitting: Emergency Medicine

## 2012-09-02 ENCOUNTER — Encounter (HOSPITAL_COMMUNITY): Payer: Self-pay | Admitting: Adult Health

## 2012-09-02 ENCOUNTER — Emergency Department (HOSPITAL_COMMUNITY): Payer: Medicare Other

## 2012-09-02 DIAGNOSIS — Z8639 Personal history of other endocrine, nutritional and metabolic disease: Secondary | ICD-10-CM | POA: Insufficient documentation

## 2012-09-02 DIAGNOSIS — Z8742 Personal history of other diseases of the female genital tract: Secondary | ICD-10-CM | POA: Insufficient documentation

## 2012-09-02 DIAGNOSIS — R110 Nausea: Secondary | ICD-10-CM | POA: Insufficient documentation

## 2012-09-02 DIAGNOSIS — F0390 Unspecified dementia without behavioral disturbance: Secondary | ICD-10-CM | POA: Insufficient documentation

## 2012-09-02 DIAGNOSIS — R109 Unspecified abdominal pain: Secondary | ICD-10-CM

## 2012-09-02 DIAGNOSIS — Z87448 Personal history of other diseases of urinary system: Secondary | ICD-10-CM | POA: Insufficient documentation

## 2012-09-02 DIAGNOSIS — Z79899 Other long term (current) drug therapy: Secondary | ICD-10-CM | POA: Insufficient documentation

## 2012-09-02 DIAGNOSIS — Z8739 Personal history of other diseases of the musculoskeletal system and connective tissue: Secondary | ICD-10-CM | POA: Insufficient documentation

## 2012-09-02 DIAGNOSIS — F3289 Other specified depressive episodes: Secondary | ICD-10-CM | POA: Insufficient documentation

## 2012-09-02 DIAGNOSIS — Z87891 Personal history of nicotine dependence: Secondary | ICD-10-CM | POA: Insufficient documentation

## 2012-09-02 DIAGNOSIS — Z7982 Long term (current) use of aspirin: Secondary | ICD-10-CM | POA: Insufficient documentation

## 2012-09-02 DIAGNOSIS — Z872 Personal history of diseases of the skin and subcutaneous tissue: Secondary | ICD-10-CM | POA: Insufficient documentation

## 2012-09-02 DIAGNOSIS — F329 Major depressive disorder, single episode, unspecified: Secondary | ICD-10-CM | POA: Insufficient documentation

## 2012-09-02 DIAGNOSIS — Z85038 Personal history of other malignant neoplasm of large intestine: Secondary | ICD-10-CM | POA: Insufficient documentation

## 2012-09-02 DIAGNOSIS — Z8719 Personal history of other diseases of the digestive system: Secondary | ICD-10-CM | POA: Insufficient documentation

## 2012-09-02 DIAGNOSIS — F411 Generalized anxiety disorder: Secondary | ICD-10-CM | POA: Insufficient documentation

## 2012-09-02 DIAGNOSIS — Z862 Personal history of diseases of the blood and blood-forming organs and certain disorders involving the immune mechanism: Secondary | ICD-10-CM | POA: Insufficient documentation

## 2012-09-02 LAB — CBC WITH DIFFERENTIAL/PLATELET
Basophils Absolute: 0.1 10*3/uL (ref 0.0–0.1)
Basophils Relative: 1 % (ref 0–1)
HCT: 34.5 % — ABNORMAL LOW (ref 36.0–46.0)
MCHC: 34.8 g/dL (ref 30.0–36.0)
Monocytes Absolute: 0.7 10*3/uL (ref 0.1–1.0)
Neutro Abs: 6.6 10*3/uL (ref 1.7–7.7)
Neutrophils Relative %: 69 % (ref 43–77)
RDW: 14.1 % (ref 11.5–15.5)

## 2012-09-02 LAB — URINE MICROSCOPIC-ADD ON

## 2012-09-02 LAB — URINALYSIS, ROUTINE W REFLEX MICROSCOPIC
Bilirubin Urine: NEGATIVE
Protein, ur: 30 mg/dL — AB
Urobilinogen, UA: 1 mg/dL (ref 0.0–1.0)

## 2012-09-02 LAB — COMPREHENSIVE METABOLIC PANEL
ALT: 9 U/L (ref 0–35)
Alkaline Phosphatase: 127 U/L — ABNORMAL HIGH (ref 39–117)
CO2: 23 mEq/L (ref 19–32)
Chloride: 107 mEq/L (ref 96–112)
GFR calc Af Amer: 42 mL/min — ABNORMAL LOW (ref >90)
GFR calc non Af Amer: 36 mL/min — ABNORMAL LOW (ref >90)
Glucose, Bld: 110 mg/dL — ABNORMAL HIGH (ref 70–99)
Potassium: 3.3 mEq/L — ABNORMAL LOW (ref 3.5–5.1)
Sodium: 141 mEq/L (ref 135–145)

## 2012-09-02 LAB — POCT I-STAT TROPONIN I: Troponin i, poc: 0 ng/mL (ref 0.00–0.08)

## 2012-09-02 MED ORDER — SODIUM CHLORIDE 0.9 % IV SOLN
1000.0000 mL | INTRAVENOUS | Status: DC
  Administered 2012-09-02: 1000 mL via INTRAVENOUS

## 2012-09-02 MED ORDER — ONDANSETRON 8 MG PO TBDP: 8.0000 mg | ORAL_TABLET | Freq: Three times a day (TID) | ORAL | Status: DC | PRN

## 2012-09-02 MED ORDER — IOHEXOL 300 MG/ML  SOLN
100.0000 mL | Freq: Once | INTRAMUSCULAR | Status: AC | PRN
  Administered 2012-09-02: 100 mL via INTRAVENOUS

## 2012-09-02 MED ORDER — IOHEXOL 300 MG/ML  SOLN
25.0000 mL | INTRAMUSCULAR | Status: AC
  Administered 2012-09-02: 25 mL via ORAL

## 2012-09-02 MED ORDER — SODIUM CHLORIDE 0.9 % IV SOLN
1000.0000 mL | Freq: Once | INTRAVENOUS | Status: AC
  Administered 2012-09-02: 1000 mL via INTRAVENOUS

## 2012-09-02 NOTE — ED Provider Notes (Signed)
History     CSN: [id number]  Arrival date & time 09/02/12  0008   First MD Initiated Contact with Patient 09/02/12 0240      Chief Complaint  Patient presents with  . Abdominal Pain     The history is provided by the patient.   patient reports developing right-sided abdominal pain that began 2 days ago.  She's had nausea without vomiting.  She describes this as aching pain in her right lower side of her abdomen.  She's had a normal bowel movement in the past 24 hours.  She denies vomiting.  No fevers or chills.  She is a long-standing history of chronic dysmotility of her bowels.  No new distention.  Symptoms are mild to moderate in severity.  Nothing worsens or improves her pain.  Tonight the pain became somewhat more sharp and that she presents the ER for evaluation.  The urinary symptoms.  No melena or hematochezia.   Past Medical History  Diagnosis Date  . MGUS (monoclonal gammopathy of unknown significance)      [facility]  . Anxiety   . History of colon cancer      History of bowel  obstruction. on treatment with Colace and MiraLax.  followed by Dr. Loreta Ave and Dr. Purnell Shoemaker  . Depression   . Diverticulosis of colon   . Osteopenia   . Lumbar spinal stenosis   . Atrophic vaginitis   . Renal cyst   . History of tobacco abuse     at least 64 pack year. Started about age 89 and continued until about age 71.  Marland Kitchen Dermatitis   . Hip pain      History  . Actinic keratosis   . Dementia   . Adrenal suppression     central, on chronic steroids, followed by Dr. Sharl Ma of endocrinology  . Gluten intolerance     August 2011 endoscopy with biopsy negative for celiac disease. Prior to this patient had elevated TTG IgG of 93 (normal <20). Dr Loreta Ave made clinical diagnosis of gluten intolerance based on diarrhea/symptoms from eating gluten. Needs GF diet.     Past Surgical History  Procedure Laterality Date  . Hemicolectomy       Right-sided  .   resection of ovarian cyst    . Cataract extraction       Left eye  . Knee arthroscopy       Left knee  . Appendectomy    . Carpal tunnel release    . Lumbar laminectomy    . Lumbar fusion    . Replacement total knee      left knee    History reviewed. No pertinent family history.  History  Substance Use Topics  . Smoking status: Former Smoker -- 1.00 packs/day for 24 years    Types: Cigarettes    Quit date: 08/01/1988  . Smokeless tobacco: Not on file  . Alcohol Use: No    OB History   Grav Para Term Preterm Abortions TAB SAB Ect Mult Living                  Review of Systems  All other systems reviewed and are negative.    Allergies  Morphine and related; Oxycodone hcl; and Pantoprazole  Home Medications   Current Outpatient Rx  Name  Route  Sig  Dispense  Refill  . acetaminophen (TYLENOL) 325 MG tablet   Oral   Take 325 mg by mouth  once.         . aspirin 81 MG tablet   Oral   Take 81 mg by mouth daily.          . calcium citrate-vitamin D (CITRACAL+D) 315-200 MG-UNIT per tablet   Oral   Take 1 tablet by mouth 2 (two) times daily.   60 tablet   11   . cyanocobalamin (,VITAMIN B-12,) 1000 MCG/ML injection   Intramuscular   Inject 1 mL (1,000 mcg total) into the muscle every 30 (thirty) days.   1 mL   11   . feeding supplement (ENSURE COMPLETE) LIQD   Oral   Take 237 mLs by mouth daily. Vanilla.         . furosemide (LASIX) 20 MG tablet   Oral   Take 1 tablet (20 mg total) by mouth daily.   45 tablet   5     Take 1 extra tab(total 2) lasix dose for 5 days wh ...   . hydrocortisone (CORTEF) 10 MG tablet   Oral   Take 5-10 mg by mouth 2 (two) times daily. 10 mg in the a.m. And 5 mg in the afternoon.         . ondansetron (ZOFRAN) 4 MG tablet   Oral   Take 1 tablet (4 mg total) by mouth every 8 (eight) hours as needed for nausea. Nausea.   30 tablet   1   . paricalcitol (ZEMPLAR) 1 MCG capsule   Oral   Take 1 mcg by mouth  every Monday, Wednesday, and Friday.         . potassium chloride (K-DUR) 10 MEQ tablet   Oral   Take 20 mEq by mouth 2 (two) times daily.         . sertraline (ZOLOFT) 50 MG tablet   Oral   Take 1 tablet (50 mg total) by mouth daily.   30 tablet   5   . simethicone (MYLICON) 80 MG chewable tablet   Oral   Chew 160 mg by mouth once.           BP 121/66  Pulse 66  Temp(Src) 98.3 F (36.8 C) (Oral)  Resp 14  SpO2 98%  Physical Exam  Nursing note and vitals reviewed. Constitutional: She is oriented to person, place, and time. She appears well-developed and well-nourished. No distress.  HENT:  Head: Normocephalic and atraumatic.  Eyes: EOM are normal.  Neck: Normal range of motion.  Cardiovascular: Normal rate, regular rhythm and normal heart sounds.   Pulmonary/Chest: Effort normal and breath sounds normal.  Abdominal: Soft. She exhibits no distension.  Mild right-sided abdominal tenderness without guarding or rebound.  Obvious evidence of peristalsis noted just at the patient's skin.  Healed midline and right upper quadrant surgical scars  Musculoskeletal: Normal range of motion.  Neurological: She is alert and oriented to person, place, and time.  Skin: Skin is warm and dry.  Psychiatric: She has a normal mood and affect. Judgment normal.    ED Course  Procedures (including critical care time)  Labs Reviewed  COMPREHENSIVE METABOLIC PANEL - Abnormal; Notable for the following:    Potassium 3.3 (*)    Glucose, Bld 110 (*)    BUN 25 (*)    Creatinine, Ser 1.30 (*)    Albumin 2.6 (*)    Alkaline Phosphatase 127 (*)    Total Bilirubin 0.1 (*)    GFR calc non Af Amer 36 (*)    GFR calc  Af Amer 42 (*)    All other components within normal limits  CBC WITH DIFFERENTIAL - Abnormal; Notable for the following:    HCT 34.5 (*)    All other components within normal limits  URINALYSIS, ROUTINE W REFLEX MICROSCOPIC - Abnormal; Notable for the following:     APPearance CLOUDY (*)    Hgb urine dipstick SMALL (*)    Protein, ur 30 (*)    Leukocytes, UA MODERATE (*)    All other components within normal limits  URINE MICROSCOPIC-ADD ON - Abnormal; Notable for the following:    Bacteria, UA FEW (*)    All other components within normal limits  LIPASE, BLOOD  URINALYSIS, MICROSCOPIC ONLY  POCT I-STAT TROPONIN I   Ct Abdomen Pelvis W Contrast  09/02/2012  *RADIOLOGY REPORT*  Clinical Data: 79 year old female with distended abdomen nausea and diarrhea.  Abdominal pain.  CT ABDOMEN AND PELVIS WITH CONTRAST  Technique:  Multidetector CT imaging of the abdomen and pelvis was performed following the standard protocol during bolus administration of intravenous contrast.  Contrast: OMNIPAQUE IOHEXOL 300 MG/ML  SOLN  Comparison: CT abdomen and pelvis 12/18/2011 and earlier.  Findings: Stable lung bases.  Postoperative changes in the lumbar spine.  Scoliosis. Stable visualized osseous structures. Osteopenia.  Redundant distal colon with sigmoid diverticulosis.  As on prior studies the sigmoid and rectum largely are decompressed.  There may be mild wall thickening of the segments.  The diverticulosis continues into the left colon which is decompressed.  Chronic severe redundancy and gaseous distention of the more proximal colon then is re-identified and has not significantly changed.  Some of the severely dilated bowel is small bowel.  A as before there is only a small amount of normal caliber small bowel present.  Oral contrast in the stomach and duodenum which are not distended.  The appearance is not significantly changed since 12/18/2011 and earlier exams.  Stable liver.  The gallbladder is not identified.  The portal venous system is patent.  The spleen and pancreas are stable and within normal limits.  Adrenal glands are within normal limits.  Bulky exophytic low-density renal lesions are chronic and have mildly enlarged since 2007.  No left hydronephrosis.   Smaller exophytic right renal lesions likewise are stable.  No right hydronephrosis.  No abdominal free fluid or pneumoperitoneum. Major arterial structures in the abdomen and pelvis appear patent. Largely decompressed bladder.  Chronic prominent mesenteric lymph nodes also are stable over this series of exams.  These measure up to 15 mm individually in short axis as before.  IMPRESSION: 1.  Chronic severe dilated proximal and mid colon as well as distal small bowel.  Appearance not significantly changed over multiple prior exams and suggests a severe chronic dysmotility.  As before, the distal colon is decompressed. 2.  There is diverticulosis of the distal colon, but no convincing evidence of distal colitis/diverticulitis. 3. Stable additional chronic findings in the abdomen and pelvis as above.   Original Report Authenticated By: Erskine Speed, M.D.    I personally reviewed the imaging tests through PACS system I reviewed available ER/hospitalization records through the EMR   1. Abdominal pain       MDM  No evidence of bowel obstruction.  The patient feels much better at this time.  Discharge home in good condition.  PCP followup.  Baseline chronic renal insufficiency        Lyanne Co, MD 09/02/12 [id number]

## 2012-09-02 NOTE — ED Notes (Addendum)
Presents with right sided abdominal pain that began Monday associated with nausea. Pain is described as "toothache pain" denies emesis, denies SOB, denies constipation. Last BM today and was normal. Nothing makes pain better. Bilateral lower extremity edema.

## 2012-09-23 ENCOUNTER — Telehealth: Payer: Self-pay | Admitting: *Deleted

## 2012-09-23 ENCOUNTER — Encounter (HOSPITAL_COMMUNITY): Payer: Self-pay | Admitting: Emergency Medicine

## 2012-09-23 ENCOUNTER — Inpatient Hospital Stay (HOSPITAL_COMMUNITY)
Admission: EM | Admit: 2012-09-23 | Discharge: 2012-09-28 | DRG: 865 | Disposition: A | Discharge status: Home / Self Care | Payer: Medicare Other | Attending: Internal Medicine | Admitting: Internal Medicine

## 2012-09-23 ENCOUNTER — Emergency Department (HOSPITAL_COMMUNITY): Payer: Medicare Other

## 2012-09-23 DIAGNOSIS — R627 Adult failure to thrive: Secondary | ICD-10-CM | POA: Diagnosis present

## 2012-09-23 DIAGNOSIS — K560 Paralytic ileus: Secondary | ICD-10-CM | POA: Diagnosis present

## 2012-09-23 DIAGNOSIS — K9041 Non-celiac gluten sensitivity: Secondary | ICD-10-CM | POA: Diagnosis present

## 2012-09-23 DIAGNOSIS — E43 Unspecified severe protein-calorie malnutrition: Secondary | ICD-10-CM | POA: Diagnosis present

## 2012-09-23 DIAGNOSIS — Z87891 Personal history of nicotine dependence: Secondary | ICD-10-CM

## 2012-09-23 DIAGNOSIS — R32 Unspecified urinary incontinence: Secondary | ICD-10-CM | POA: Diagnosis not present

## 2012-09-23 DIAGNOSIS — E2740 Unspecified adrenocortical insufficiency: Secondary | ICD-10-CM | POA: Diagnosis present

## 2012-09-23 DIAGNOSIS — F3289 Other specified depressive episodes: Secondary | ICD-10-CM | POA: Diagnosis present

## 2012-09-23 DIAGNOSIS — R64 Cachexia: Secondary | ICD-10-CM | POA: Diagnosis present

## 2012-09-23 DIAGNOSIS — R109 Unspecified abdominal pain: Secondary | ICD-10-CM | POA: Diagnosis present

## 2012-09-23 DIAGNOSIS — E860 Dehydration: Secondary | ICD-10-CM

## 2012-09-23 DIAGNOSIS — N179 Acute kidney failure, unspecified: Secondary | ICD-10-CM

## 2012-09-23 DIAGNOSIS — Z9119 Patient's noncompliance with other medical treatment and regimen: Secondary | ICD-10-CM

## 2012-09-23 DIAGNOSIS — E538 Deficiency of other specified B group vitamins: Secondary | ICD-10-CM | POA: Diagnosis present

## 2012-09-23 DIAGNOSIS — Z85038 Personal history of other malignant neoplasm of large intestine: Secondary | ICD-10-CM

## 2012-09-23 DIAGNOSIS — F329 Major depressive disorder, single episode, unspecified: Secondary | ICD-10-CM | POA: Diagnosis present

## 2012-09-23 DIAGNOSIS — Z96659 Presence of unspecified artificial knee joint: Secondary | ICD-10-CM

## 2012-09-23 DIAGNOSIS — E739 Lactose intolerance, unspecified: Secondary | ICD-10-CM | POA: Diagnosis present

## 2012-09-23 DIAGNOSIS — D649 Anemia, unspecified: Secondary | ICD-10-CM | POA: Diagnosis present

## 2012-09-23 DIAGNOSIS — N189 Chronic kidney disease, unspecified: Secondary | ICD-10-CM | POA: Diagnosis present

## 2012-09-23 DIAGNOSIS — F411 Generalized anxiety disorder: Secondary | ICD-10-CM | POA: Diagnosis present

## 2012-09-23 DIAGNOSIS — K567 Ileus, unspecified: Secondary | ICD-10-CM

## 2012-09-23 DIAGNOSIS — I959 Hypotension, unspecified: Secondary | ICD-10-CM

## 2012-09-23 DIAGNOSIS — K900 Celiac disease: Secondary | ICD-10-CM | POA: Diagnosis present

## 2012-09-23 DIAGNOSIS — J1189 Influenza due to unidentified influenza virus with other manifestations: Principal | ICD-10-CM | POA: Diagnosis present

## 2012-09-23 DIAGNOSIS — D472 Monoclonal gammopathy: Secondary | ICD-10-CM | POA: Diagnosis present

## 2012-09-23 DIAGNOSIS — Z91199 Patient's noncompliance with other medical treatment and regimen due to unspecified reason: Secondary | ICD-10-CM

## 2012-09-23 DIAGNOSIS — J101 Influenza due to other identified influenza virus with other respiratory manifestations: Secondary | ICD-10-CM

## 2012-09-23 DIAGNOSIS — Z681 Body mass index (BMI) 19 or less, adult: Secondary | ICD-10-CM

## 2012-09-23 DIAGNOSIS — E872 Acidosis, unspecified: Secondary | ICD-10-CM | POA: Diagnosis present

## 2012-09-23 DIAGNOSIS — Z8589 Personal history of malignant neoplasm of other organs and systems: Secondary | ICD-10-CM

## 2012-09-23 DIAGNOSIS — E876 Hypokalemia: Secondary | ICD-10-CM | POA: Diagnosis present

## 2012-09-23 LAB — CBC WITH DIFFERENTIAL/PLATELET
Eosinophils Relative: 0 % (ref 0–5)
HCT: 36.2 % (ref 36.0–46.0)
Hemoglobin: 11.7 g/dL — ABNORMAL LOW (ref 12.0–15.0)
Lymphocytes Relative: 25 % (ref 12–46)
MCHC: 32.3 g/dL (ref 30.0–36.0)
MCV: 85.8 fL (ref 78.0–100.0)
Monocytes Absolute: 0.4 10*3/uL (ref 0.1–1.0)
Monocytes Relative: 6 % (ref 3–12)
Neutro Abs: 4.3 10*3/uL (ref 1.7–7.7)
RDW: 14.3 % (ref 11.5–15.5)

## 2012-09-23 LAB — URINALYSIS, ROUTINE W REFLEX MICROSCOPIC
Ketones, ur: NEGATIVE mg/dL
Nitrite: NEGATIVE
Protein, ur: 30 mg/dL — AB
Urobilinogen, UA: 0.2 mg/dL (ref 0.0–1.0)

## 2012-09-23 LAB — URINE MICROSCOPIC-ADD ON

## 2012-09-23 LAB — COMPREHENSIVE METABOLIC PANEL
BUN: 27 mg/dL — ABNORMAL HIGH (ref 6–23)
CO2: 19 mEq/L (ref 19–32)
Calcium: 8 mg/dL — ABNORMAL LOW (ref 8.4–10.5)
Chloride: 108 mEq/L (ref 96–112)
Creatinine, Ser: 1.65 mg/dL — ABNORMAL HIGH (ref 0.50–1.10)
GFR calc Af Amer: 32 mL/min — ABNORMAL LOW (ref >90)
GFR calc non Af Amer: 27 mL/min — ABNORMAL LOW (ref >90)
Total Bilirubin: 0.2 mg/dL — ABNORMAL LOW (ref 0.3–1.2)

## 2012-09-23 LAB — LIPASE, BLOOD: Lipase: 30 U/L (ref 11–59)

## 2012-09-23 LAB — AMYLASE: Amylase: 23 U/L (ref 0–105)

## 2012-09-23 LAB — POCT I-STAT TROPONIN I

## 2012-09-23 MED ORDER — ONDANSETRON HCL 4 MG/2ML IJ SOLN: 4.0000 mg | Freq: Four times a day (QID) | INTRAMUSCULAR | Status: DC | PRN

## 2012-09-23 MED ORDER — SODIUM CHLORIDE 0.9 % IJ SOLN
3.0000 mL | Freq: Two times a day (BID) | INTRAMUSCULAR | Status: DC
  Administered 2012-09-24 – 2012-09-27 (×7): 3 mL via INTRAVENOUS

## 2012-09-23 MED ORDER — SODIUM CHLORIDE 0.9 % IV BOLUS (SEPSIS)
1000.0000 mL | Freq: Once | INTRAVENOUS | Status: AC
  Administered 2012-09-23: 1000 mL via INTRAVENOUS

## 2012-09-23 MED ORDER — ASPIRIN EC 81 MG PO TBEC
81.0000 mg | DELAYED_RELEASE_TABLET | Freq: Every day | ORAL | Status: DC
  Administered 2012-09-23 – 2012-09-28 (×6): 81 mg via ORAL
  Filled 2012-09-23 (×6): qty 1

## 2012-09-23 MED ORDER — ONDANSETRON HCL 4 MG PO TABS: 4.0000 mg | ORAL_TABLET | Freq: Four times a day (QID) | ORAL | Status: DC | PRN

## 2012-09-23 MED ORDER — ACETAMINOPHEN 650 MG RE SUPP: 650.0000 mg | Freq: Four times a day (QID) | RECTAL | Status: DC | PRN

## 2012-09-23 MED ORDER — HYDROCORTISONE 5 MG PO TABS
10.0000 mg | ORAL_TABLET | Freq: Every day | ORAL | Status: DC
  Filled 2012-09-23: qty 2

## 2012-09-23 MED ORDER — SODIUM CHLORIDE 0.9 % IV SOLN: 250.0000 mL | INTRAVENOUS | Status: DC | PRN

## 2012-09-23 MED ORDER — ONDANSETRON HCL 4 MG/2ML IJ SOLN
4.0000 mg | Freq: Once | INTRAMUSCULAR | Status: AC
  Administered 2012-09-23: 4 mg via INTRAVENOUS
  Filled 2012-09-23: qty 2

## 2012-09-23 MED ORDER — SODIUM CHLORIDE 0.9 % IV SOLN
INTRAVENOUS | Status: DC
  Administered 2012-09-23: 22:00:00 via INTRAVENOUS

## 2012-09-23 MED ORDER — POTASSIUM CHLORIDE CRYS ER 20 MEQ PO TBCR: 40.0000 meq | EXTENDED_RELEASE_TABLET | Freq: Once | ORAL | Status: DC

## 2012-09-23 MED ORDER — SERTRALINE HCL 50 MG PO TABS
50.0000 mg | ORAL_TABLET | Freq: Every day | ORAL | Status: DC
  Administered 2012-09-24 – 2012-09-28 (×5): 50 mg via ORAL
  Filled 2012-09-23 (×5): qty 1

## 2012-09-23 MED ORDER — ACETAMINOPHEN 325 MG PO TABS: 650.0000 mg | ORAL_TABLET | Freq: Four times a day (QID) | ORAL | Status: DC | PRN

## 2012-09-23 MED ORDER — ASPIRIN 81 MG PO TABS: 81.0000 mg | ORAL_TABLET | Freq: Every day | ORAL | Status: DC

## 2012-09-23 MED ORDER — POTASSIUM CHLORIDE 20 MEQ/15ML (10%) PO LIQD
40.0000 meq | Freq: Once | ORAL | Status: AC
  Administered 2012-09-23: 40 meq via ORAL
  Filled 2012-09-23: qty 30

## 2012-09-23 MED ORDER — SODIUM CHLORIDE 0.9 % IJ SOLN
3.0000 mL | Freq: Two times a day (BID) | INTRAMUSCULAR | Status: DC
  Administered 2012-09-25 (×2): 3 mL via INTRAVENOUS

## 2012-09-23 MED ORDER — ENOXAPARIN SODIUM 30 MG/0.3ML ~~LOC~~ SOLN
30.0000 mg | SUBCUTANEOUS | Status: DC
  Administered 2012-09-23 – 2012-09-27 (×5): 30 mg via SUBCUTANEOUS
  Filled 2012-09-23 (×6): qty 0.3

## 2012-09-23 MED ORDER — HYDROCORTISONE 5 MG PO TABS
5.0000 mg | ORAL_TABLET | Freq: Every day | ORAL | Status: DC
  Filled 2012-09-23: qty 1

## 2012-09-23 MED ORDER — SODIUM CHLORIDE 0.9 % IJ SOLN: 3.0000 mL | INTRAMUSCULAR | Status: DC | PRN

## 2012-09-23 NOTE — Progress Notes (Signed)
ANTICOAGULATION CONSULT NOTE - Initial Consult  Pharmacy Consult for Lovenox Indication: VTE prophylaxis  Allergies  Allergen Reactions  . Morphine And Related Other (See Comments)    delirium  . Oxycodone Hcl (Oxycodone Hcl) Other (See Comments)    Unknown reaction  . Pantoprazole (Pantoprazole Sodium) Nausea Only    Patient Measurements: Height: 5\' 7"  (170.2 cm) Weight: 116 lb 10 oz (52.9 kg) IBW/kg (Calculated) : 61.6  Vital Signs: Temp: 98 F (36.7 C) (05/28 1951) Temp src: Oral (05/28 1951) BP: 127/68 mmHg (05/28 1951) Pulse Rate: 57 (05/28 1951)  Labs:  Recent Labs  09/23/12 1027  HGB 11.7*  HCT 36.2  PLT 147*  CREATININE 1.65*    Estimated Creatinine Clearance: 20.8 ml/min (by C-G formula based on Cr of 1.65).   Medical History: Past Medical History  Diagnosis Date  . MGUS (monoclonal gammopathy of unknown significance)      [facility]  . Anxiety   . History of colon cancer      History of bowel  obstruction. on treatment with Colace and MiraLax.  followed by Dr. Loreta Ave and Dr. Purnell Shoemaker  . Depression   . Diverticulosis of colon   . Osteopenia   . Lumbar spinal stenosis   . Atrophic vaginitis   . Renal cyst   . History of tobacco abuse     at least 64 pack year. Started about age 40 and continued until about age 28.  Marland Kitchen Dermatitis   . Hip pain      History  . Actinic keratosis   . Dementia   . Adrenal suppression     central, on chronic steroids, followed by Dr. Sharl Ma of endocrinology  . Gluten intolerance     August 2011 endoscopy with biopsy negative for celiac disease. Prior to this patient had elevated TTG IgG of 93 (normal <20). Dr Loreta Ave made clinical diagnosis of gluten intolerance based on diarrhea/symptoms from eating gluten. Needs GF diet.     Medications:  Prescriptions prior to admission  Medication Sig Dispense Refill  . acetaminophen (TYLENOL) 325 MG tablet Take 325 mg by mouth once.      Marland Kitchen  aspirin 81 MG tablet Take 81 mg by mouth daily.       . calcium citrate-vitamin D (CITRACAL+D) 315-200 MG-UNIT per tablet Take 1 tablet by mouth 2 (two) times daily.  60 tablet  11  . cyanocobalamin (,VITAMIN B-12,) 1000 MCG/ML injection Inject 1 mL (1,000 mcg total) into the muscle every 30 (thirty) days.  1 mL  11  . feeding supplement (ENSURE COMPLETE) LIQD Take 237 mLs by mouth daily. Vanilla.  Hasn't been giving since the sickness started.      . furosemide (LASIX) 20 MG tablet Take 1 tablet (20 mg total) by mouth daily.  45 tablet  5  . hydrocortisone (CORTEF) 10 MG tablet Take 5-10 mg by mouth 2 (two) times daily. 10 mg in the a.m. And 5 mg in the afternoon.      . ondansetron (ZOFRAN ODT) 8 MG disintegrating tablet Take 1 tablet (8 mg total) by mouth every 8 (eight) hours as needed for nausea.  10 tablet  0  . ondansetron (ZOFRAN) 4 MG tablet Take 1 tablet (4 mg total) by mouth every 8 (eight) hours as needed for nausea. Nausea.  30 tablet  1  . paricalcitol (ZEMPLAR) 1 MCG capsule Take 1 mcg by mouth every Monday, Wednesday, and Friday.      Marland Kitchen  potassium chloride (K-DUR) 10 MEQ tablet Take 20 mEq by mouth 2 (two) times daily.      . sertraline (ZOLOFT) 50 MG tablet Take 1 tablet (50 mg total) by mouth daily.  30 tablet  5  . simethicone (MYLICON) 80 MG chewable tablet Chew 160 mg by mouth once.        Assessment: 79 year old woman to receive Lovenox while hospitalized for VTE prophylaxis.  She is 53kg, creatinine is 1.7 and her CrCl is 52mL/min Goal of Therapy:  prevent VTE    Plan:  Lovenox 30mg  daily.   Pharmacy will sign off.  Please reconsult PRN.  Mickeal Skinner 09/23/2012,8:09 PM

## 2012-09-23 NOTE — Telephone Encounter (Signed)
Pt's daughter calls and states since Sunday pt has fever, N&V, severe diarrhea, weakness, keeping very little down, too weak to move on own. She is ask to call 911 or transport pt to ED asap, she is agreeable and will do so now

## 2012-09-23 NOTE — ED Notes (Signed)
Family reports that pt c/o fever (102.0), cough, nausea and diarrhea onset Saturday. Family also reports pt has not been eating for past 3 days and only taking in sips of water.

## 2012-09-23 NOTE — H&P (Signed)
Date: 09/23/2012  Patient name: Rachel Bridges  Medical record number: [id number]  Date of birth: 1924-10-09   I have seen and evaluated Salvadore Oxford and discussed their care with the Residency Team. Ms Pappalardo is well known to me. She has had about 6 days of feeling cold, increasing N, and vomiting. She states the bottom of her stomach doesn't feel right but denies outright pain. She has had no appetite for 3 days bc every time she eats she gets watery diarrhea that is at times incontinence. This occurs about 30 minutes after she eats but states it depends on what she eats. However it seems that this is a chronic issue as has had for years. She also c/o ABD swelling, cough for 5 days productive of white sputum, transient fever over R eye, dizziness when she stands resulting in one fall, subjective fever, sore throat, sneezing, and DOE.  Physical Exam: Blood pressure 124/63, pulse 64, temperature 98.5 F (36.9 C), temperature source Oral, resp. rate 16, weight 128 lb (58.06 kg), SpO2 97.00%. General appearance: alert, cooperative, appears stated age and no distress Head: Normocephalic, without obvious abnormality, atraumatic Eyes: negative findings: lids and lashes normal, conjunctivae and sclerae normal and EOMI, positive findings: mild tearing of eyes and drooping lower lids Ears: Pinpoint scab R pinna Throat: Mucous mem moist, no teeth, pinpoint blood blister bottom of tongue Back: no skin lesions, erythema, or scars Lungs: clear to auscultation bilaterally Heart: regular rate and rhythm, S1, S2 normal, no murmur, click, rub or gallop Abdomen: distended, hypoactive BS, some high pitched, soft, NT, no rebound Extremities: several ecchymosis Pulses: L DP faint but nl on R, radial present and nl B Skin: ecchymosis, thin skin, pale Neurologic: Mental status: Appears easily confused, able to only get 1 (2 with prompting) immediate recall, 5 min not tested, oriented to place, person, not time  (month, day, yr). Overall weak - required assistance sitting  Lab results: Results for orders placed during the hospital encounter of 09/23/12 (from the past 24 hour(s))  CBC WITH DIFFERENTIAL     Status: Abnormal   Collection Time    09/23/12 10:27 AM      Result Value Range   WBC 6.2  4.0 - 10.5 K/uL   RBC 4.22  3.87 - 5.11 MIL/uL   Hemoglobin 11.7 (*) 12.0 - 15.0 g/dL   HCT 78.2  95.6 - 21.3 %   MCV 85.8  78.0 - 100.0 fL   MCH 27.7  26.0 - 34.0 pg   MCHC 32.3  30.0 - 36.0 g/dL   RDW 08.6  57.8 - 46.9 %   Platelets 147 (*) 150 - 400 K/uL   Neutrophils Relative % 69  43 - 77 %   Neutro Abs 4.3  1.7 - 7.7 K/uL   Lymphocytes Relative 25  12 - 46 %   Lymphs Abs 1.6  0.7 - 4.0 K/uL   Monocytes Relative 6  3 - 12 %   Monocytes Absolute 0.4  0.1 - 1.0 K/uL   Eosinophils Relative 0  0 - 5 %   Eosinophils Absolute 0.0  0.0 - 0.7 K/uL   Basophils Relative 0  0 - 1 %   Basophils Absolute 0.0  0.0 - 0.1 K/uL  COMPREHENSIVE METABOLIC PANEL     Status: Abnormal   Collection Time    09/23/12 10:27 AM      Result Value Range   Sodium 142  135 - 145 mEq/L   Potassium 3.0 (*)  3.5 - 5.1 mEq/L   Chloride 108  96 - 112 mEq/L   CO2 19  19 - 32 mEq/L   Glucose, Bld 121 (*) 70 - 99 mg/dL   BUN 27 (*) 6 - 23 mg/dL   Creatinine, Ser 4.09 (*) 0.50 - 1.10 mg/dL   Calcium 8.0 (*) 8.4 - 10.5 mg/dL   Total Protein 6.3  6.0 - 8.3 g/dL   Albumin 2.5 (*) 3.5 - 5.2 g/dL   AST 18  0 - 37 U/L   ALT 11  0 - 35 U/L   Alkaline Phosphatase 95  39 - 117 U/L   Total Bilirubin 0.2 (*) 0.3 - 1.2 mg/dL   GFR calc non Af Amer 27 (*) >90 mL/min   GFR calc Af Amer 32 (*) >90 mL/min  AMYLASE     Status: None   Collection Time    09/23/12 10:27 AM      Result Value Range   Amylase 23  0 - 105 U/L  LIPASE, BLOOD     Status: None   Collection Time    09/23/12 10:27 AM      Result Value Range   Lipase 30  11 - 59 U/L  POCT I-STAT TROPONIN I     Status: None   Collection Time    09/23/12 10:39 AM      Result  Value Range   Troponin i, poc 0.00  0.00 - 0.08 ng/mL   Comment 3           CG4 I-STAT (LACTIC ACID)     Status: None   Collection Time    09/23/12 10:41 AM      Result Value Range   Lactic Acid, Venous 1.50  0.5 - 2.2 mmol/L  URINALYSIS, ROUTINE W REFLEX MICROSCOPIC     Status: Abnormal   Collection Time    09/23/12 12:10 PM      Result Value Range   Color, Urine YELLOW  YELLOW   APPearance CLOUDY (*) CLEAR   Specific Gravity, Urine 1.017  1.005 - 1.030   pH 5.5  5.0 - 8.0   Glucose, UA NEGATIVE  NEGATIVE mg/dL   Hgb urine dipstick NEGATIVE  NEGATIVE   Bilirubin Urine SMALL (*) NEGATIVE   Ketones, ur NEGATIVE  NEGATIVE mg/dL   Protein, ur 30 (*) NEGATIVE mg/dL   Urobilinogen, UA 0.2  0.0 - 1.0 mg/dL   Nitrite NEGATIVE  NEGATIVE   Leukocytes, UA NEGATIVE  NEGATIVE  URINE MICROSCOPIC-ADD ON     Status: Abnormal   Collection Time    09/23/12 12:10 PM      Result Value Range   Squamous Epithelial / LPF RARE  RARE   WBC, UA 3-6  <3 WBC/hpf   RBC / HPF 0-2  <3 RBC/hpf   Bacteria, UA FEW (*) RARE   Casts HYALINE CASTS (*) NEGATIVE   Urine-Other LESS THAN 10 mL OF URINE SUBMITTED      Imaging results:  Dg Abd Acute W/chest  09/23/2012   *RADIOLOGY REPORT*  Clinical Data: Abdominal pain, diarrhea, past history of colon cancer, smoking  ACUTE ABDOMEN SERIES (ABDOMEN 2 VIEW & CHEST 1 VIEW)  Comparison: 06/01/2012  Findings: Normal heart size and pulmonary vascularity. Tortuous aorta. Lungs emphysematous but clear. No pleural effusion or pneumothorax. Gaseous distention of colon with bowel interposition between liver and diaphragm. No definite bowel wall thickening or free intraperitoneal air. Distended air filled loops of small bowel are also visualized. Osseous demineralization with  prior lumbar fusion. Numerous pelvic phleboliths.  IMPRESSION: Mild gaseous distention of large and small bowel loops in abdomen without discrete focal point of obstruction identified. Findings could  represent colonic obstruction or ileus; if further imaging is clinically indicated, consider computed tomography of the abdomen and pelvis with IV and oral contrast.   Original Report Authenticated By: Ulyses Southward, M.D.    Assessment and Plan: I have seen and evaluated the patient as outlined above. I agree with the formulated Assessment and Plan as detailed in the residents' admission note, with the following changes:   1. Chronic ileus - Pt was admitted for similar sxs 2/14 and was treated symptomatically and D/C'd the following AM. The only difference is then she had feculent smelling emesis (was unable to give this hx today). Her plain film likely represents chronic ileus rather than acute. However, due to N/V we will keep py NPO over night and reassess in AM.   2. Acute on chronic renal failure - likely pre-renal as pt admits to decreased intake and GI losses. Hydrate. Follow creatinine.  3. Hypotension - Likely 2/2 volume loss. Hydrate. There was concern in ED for sepsis - WBC nl, Temp nl, lactic acid nl. Will not start ABX now but follow labs, clinical picture, and urine cx.  4. Gap metabolic acidosis - Simple, no delta delta. Likely 2/2 starvation or renal failure.  5. Dementia - I have never done an exam on her and was surprised at her deficiencies. Daughter provides good care. Will see if needs any additional assistance - [facility]N.   Burns Spain, MD 5/28/20143:33 PM

## 2012-09-23 NOTE — ED Notes (Signed)
Lactic acid results called to Diplomatic Services operational officer, nurse not available

## 2012-09-23 NOTE — H&P (Signed)
[facility] Note Date: 09/23/2012  Patient name: Rachel Bridges Medical record number: [id number] Date of birth: [birth year] Age: 79 y.o. Gender: female PCP: Lars Mage, MD  Medical Service: Inpatient Medicine Teaching Service  Weekday Hours (7AM-5PM):  1st Contact: Sadek [phone] 2nd Contact:Li [phone] ** If no return call within 15 minutes (after trying both pagers listed below), please call after hours pagers.  After 5 pm or weekends: 1st Contact: Pager: [phone] 2nd Contact: Pager: [phone]  Attending physician: Blanch Media, MD     Chief Complaint: nausea/vomiting  History of Present Illness:  Ms. Furukawa is an 79 year-old woman with PMH significant for celiac disease s/p colectomy, adrenal insufficiency (empty sella syndrome), monoclonal gammopathy, vitamin B12 deficiency, colon cancer (s/p hemicolectomy) who presents to the ED with a six day history ("last Friday") of nausea, non-bloody vomiting, and abdominal bloating.  Patient states that she does not adhere to gluten-free diet, but is not positive because daughter does the cooking in the household.  Patient is semi-independent of ADLs, ambulating with cane that has recently been lost.  Daughter is primary care-taker, non present during examination.  Patient also complains of a productive cough with white, non-bloody sputum.  Patient stated that lying on her side worsenens the cough. Patient reports she had a subjective fever two days ago.  Patient did not know daughter's diagnosis, but states that daughter came to [facility] for care.  Patient states that she has not taken any medication to alleviate the symptoms.  Patient states that daughter had a similar illness 2 weeks ago.  Patient denies abdominal pain, congestion, fatigue, myalgias, hematemesis, hematochezia, melena.  Patient also states that she had a HA behind her left eye last Sunday that resolved.  Patient complains of a chronic  history of diarrhea with loose, watery stools.  She states that each time she goes goes to the restroom, she has a BM.  Patient complain of chronic dizziness since diagnosis of adrenal insufficiency and occasion mechanical falls.   Meds: Current Outpatient Rx  Name  Route  Sig  Dispense  Refill  . acetaminophen (TYLENOL) 325 MG tablet   Oral   Take 325 mg by mouth once.         Marland Kitchen aspirin 81 MG tablet   Oral   Take 81 mg by mouth daily.          . calcium citrate-vitamin D (CITRACAL+D) 315-200 MG-UNIT per tablet   Oral   Take 1 tablet by mouth 2 (two) times daily.   60 tablet   11   . cyanocobalamin (,VITAMIN B-12,) 1000 MCG/ML injection   Intramuscular   Inject 1 mL (1,000 mcg total) into the muscle every 30 (thirty) days.   1 mL   11   . feeding supplement (ENSURE COMPLETE) LIQD   Oral   Take 237 mLs by mouth daily. Vanilla.  Hasn't been giving since the sickness started.         . furosemide (LASIX) 20 MG tablet   Oral   Take 1 tablet (20 mg total) by mouth daily.   45 tablet   5     Take 1 extra tab(total 2) lasix dose for 5 days wh ...   . hydrocortisone (CORTEF) 10 MG tablet   Oral   Take 5-10 mg by mouth 2 (two) times daily. 10 mg in the a.m. And 5 mg in the afternoon.         . ondansetron (ZOFRAN ODT) 8  MG disintegrating tablet   Oral   Take 1 tablet (8 mg total) by mouth every 8 (eight) hours as needed for nausea.   10 tablet   0   . ondansetron (ZOFRAN) 4 MG tablet   Oral   Take 1 tablet (4 mg total) by mouth every 8 (eight) hours as needed for nausea. Nausea.   30 tablet   1   . paricalcitol (ZEMPLAR) 1 MCG capsule   Oral   Take 1 mcg by mouth every Monday, Wednesday, and Friday.         . potassium chloride (K-DUR) 10 MEQ tablet   Oral   Take 20 mEq by mouth 2 (two) times daily.         . sertraline (ZOLOFT) 50 MG tablet   Oral   Take 1 tablet (50 mg total) by mouth daily.   30 tablet   5   . simethicone (MYLICON) 80 MG  chewable tablet   Oral   Chew 160 mg by mouth once.           Allergies: Allergies as of 09/23/2012 - Review Complete 09/23/2012  Allergen Reaction Noted  . Morphine and related Other (See Comments) 03/02/2011  . Oxycodone hcl (oxycodone hcl) Other (See Comments) 03/02/2011  . Pantoprazole (pantoprazole sodium) Nausea Only 03/02/2011   Past Medical History  Diagnosis Date  . MGUS (monoclonal gammopathy of unknown significance)      [facility]  . Anxiety   . History of colon cancer      History of bowel  obstruction. on treatment with Colace and MiraLax.  followed by Dr. Loreta Ave and Dr. Purnell Shoemaker  . Depression   . Diverticulosis of colon   . Osteopenia   . Lumbar spinal stenosis   . Atrophic vaginitis   . Renal cyst   . History of tobacco abuse     at least 64 pack year. Started about age 74 and continued until about age 69.  Marland Kitchen Dermatitis   . Hip pain      History  . Actinic keratosis   . Dementia   . Adrenal suppression     central, on chronic steroids, followed by Dr. Sharl Ma of endocrinology  . Gluten intolerance     August 2011 endoscopy with biopsy negative for celiac disease. Prior to this patient had elevated TTG IgG of 93 (normal <20). Dr Loreta Ave made clinical diagnosis of gluten intolerance based on diarrhea/symptoms from eating gluten. Needs GF diet.    Past Surgical History  Procedure Laterality Date  . Hemicolectomy       Right-sided  .  resection of ovarian cyst    . Cataract extraction       Left eye  . Knee arthroscopy       Left knee  . Appendectomy    . Carpal tunnel release    . Lumbar laminectomy    . Lumbar fusion    . Replacement total knee      left knee   No family history on file. History   Social History  . Marital Status: Widowed    Spouse Name: N/A    Number of Children: N/A  . Years of Education: N/A   Occupational History  . Not on file.   Social History Main Topics  . Smoking status: Former  Smoker -- 1.00 packs/day for 24 years    Types: Cigarettes    Quit date: 08/01/1988  . Smokeless tobacco: Not  on file  . Alcohol Use: No  . Drug Use: No  . Sexually Active: No   Other Topics Concern  . Not on file   Social History Narrative    Husband died of myeloma.    Patient is to daughter's one of whom she lives with her.    Review of Systems: Review of Systems:  Constitutional:  Endorses fever, decrease in appetite.  Denies chills, diaphoresis, and fatigue.   HEENT:  Endorses sore throat since yesterday, sneezing.  Denies congestion, rhinorrhea, mouth sores, trouble swallowing, neck pain   Respiratory:  Endorses cough.  Denies SOB, DOE, and wheezing.   Cardiovascular:  Denies palpitations and leg swelling.   Gastrointestinal:  Endorses nausea, vomiting, diarrhea, abdominal distension.  Denies abdominal pain, constipation, and blood in stool.   Genitourinary:  Endorses difficulty urinating.  Denies dysuria, urgency, frequency, hematuria, flank pain.   Musculoskeletal:  Denies myalgias, back pain, joint swelling, arthralgias and gait problem.   Skin:  Endorses multiple non-traumatic ecchymosis. Denies pallor, rash and wound.   Neurological:  Endorses dizziness, light-headedness, headaches.  Denies  seizures, syncope, weakness, and numbnes.    .   Physical Exam: Blood pressure 109/53, pulse 62, temperature 98.5 F (36.9 C), temperature source Oral, resp. rate 22, weight 58.06 kg (128 lb), SpO2 93.00%. Vitals reviewed.  General: elderly, pleasant woman resting in bed, NAD  HEENT: NCAT, PERRL, EOMI, no scleral icterus. Pharynx Pink and moist, no erythema, and no exudates.    Neck: supple, full ROM, no thyromegaly, no JVD, and no carotid bruits. Cardiac: Regular Rate and Rhythm, no rubs, murmurs or gallops. No JVD appreciated  Pulm: No increased WOB. Clear to auscultation bilaterally, no wheezes, rales, or rhonchi  Abd: abdomen soft, nontender, nondistended, with normoactive BS   Msk: no joint swelling, no joint warmth, and no redness over joints.  Pulses: 2+ DP/PT pulses bilaterally Ext: No edema, with poor skin turgor. Has ecchymosis over L knee, no joint effusions, pain or decreased ROM.  Neuro: alert and oriented to person and place, not to date, Patient could not recall name of President.  Patient could not recall 3 words when asked to repeat.  Patient admitted memory deficits and aware of test.  Cranial nerves II-XII grossly intact, strength and sensation to light touch equal in bilateral upper and lower extremities.  Normally interactive, good eye contact, not anxious appearing, and not depressed appearing.  Lab results:  Recent Labs Lab 09/23/12 1027  NA 142  K 3.0*  CL 108  CO2 19  GLUCOSE 121*  BUN 27*  CREATININE 1.65*  CALCIUM 8.0*    Liver Function Tests:  Recent Labs Lab 09/23/12 1027  AST 18  ALT 11  ALKPHOS 95  BILITOT 0.2*  PROT 6.3  ALBUMIN 2.5*    Recent Labs Lab 09/23/12 1027  LIPASE 30  AMYLASE 23   No results found for this basename: AMMONIA,  in the last 168 hours  CBC:  Recent Labs Lab 09/23/12 1027  WBC 6.2  NEUTROABS 4.3  HGB 11.7*  HCT 36.2  MCV 85.8  PLT 147*    Cardiac Enzymes: No results found for this basename: CKTOTAL, CKMB, CKMBINDEX, TROPONINI,  in the last 168 hours  BNP: BNP (last 3 results) No results found for this basename: PROBNP,  in the last 8760 hours   Imaging results:  Dg Abd Acute W/chest  09/23/2012   *RADIOLOGY REPORT*  Clinical Data: Abdominal pain, diarrhea, past history of colon cancer, smoking  ACUTE ABDOMEN SERIES (ABDOMEN 2 VIEW & CHEST 1 VIEW)  Comparison: 06/01/2012  Findings: Normal heart size and pulmonary vascularity. Tortuous aorta. Lungs emphysematous but clear. No pleural effusion or pneumothorax. Gaseous distention of colon with bowel interposition between liver and diaphragm. No definite bowel wall thickening or free intraperitoneal air. Distended air filled loops  of small bowel are also visualized. Osseous demineralization with prior lumbar fusion. Numerous pelvic phleboliths.  IMPRESSION: Mild gaseous distention of large and small bowel loops in abdomen without discrete focal point of obstruction identified. Findings could represent colonic obstruction or ileus; if further imaging is clinically indicated, consider computed tomography of the abdomen and pelvis with IV and oral contrast.   Original Report Authenticated By: Ulyses Southward, M.D.    Other results: EKG: Normal rate, rhythm, inteval, no axis deviation.  Possible R BBB.  Assessment & Plan by Problem: Active Problems:   * No active hospital problems. 18  79 y.o woman presenting with N/V/D and decreased PO.   1.Viral illness vs Gastroenteritis complicated 2/2 chronic ileus. Pt admitted for similar symptoms 2/14. This has persisted for six days. In addition she has had nausea, vomiting that has been presistant, and now decreased po intake. Symptoms and nausea and vomiting and daughter that had recent illness could suggest influenza, but unlikely given chronic course without symptom of myalgias, etc.  Loose stool could indicate C. Diff, but this appears to be a chronic issue.  She currently is hemodynamically stable without fever, tachycardia.  Urinalysis does not demonstrate infection.  Amylase and lipases normal.  Does not meet SIRS criteria - Admit to floor.  - Acute Abdomen Series w/ chest: Mild gaseous distention of large and small bowel loops in abdomen without discrete focal point of obstruction identified. Findings could represent colonic obstruction or ileus; if further imaging is clinically indicated, consider computed tomography of the abdomen and pelvis with IV and oral contrast. - Ordered C. Diff stool PCR, influenza PCR - am CBC and BMET, Urine Cr, Urine BUN, TSH - UA and UCx - strict I/Os  - lactic acid 1.50 - POC troponin - Rx Zofran 4 mg q8 prn iv, acetaminophen 650 mg, NS 75 cc/hr,  -  Tylenol 650 mg prn  - Nutrition consulted - Will consider GI consult   2. Hypotension. Likely 2/2 to decreased po and dehydration.  SBPs 80s that responded to IVF. -hydrating with NS at 75ml/hr, concern for hx pulmonary edema  -consider obtain orthostatics -Consult PT/OT  3. Acute on chronic renal failure. Most likely pre-renal to GI loss and decreased po intake. -monitor Cr -Obtain FUrea because of hx of diuretic use  4. Hypokalemia: K 3.0 likely 2/2 to diarrhea -replace K -daily BMET  5. Anion Gap Metabolic Acidosis - no delta delta.  Likely 2/2 to decrease po intake or renal failure. -monitor BMET  6. Adrenal suppression. Her home dose was Cortef 10 mg qam and 5 mg qpm -Continue Cortef home dose 10 mg qam and 5 mg qpm -Order am cortisol  7. Severe protein calorie malnutrition Albumin 2.5.  Patient is cachetic .Likely multifactorial with component of gluten and lactose intolerance, gut dysmotility, recurrent diarrhea, possible chronic intestinal pseudo-obstruction. Patient's reports decreased po intake - Nutritional services consulted  8. Hx Gluten intolerance  -when pt can tolerate po start gluten free diet   9. Hx Chronic normocytic anemia  Likely anemia of chronic disease. Has h/o of B12 deficiency which is treated w monthly injections, last level within normal limits. Hgb  11.7 today, baseline. - am CBC, monitor  10. Hx Dementia Alert and oriented to person and place only.  Cannot repeat 3 words when asked.  Cannot recall president.  Unknown baseline.  Daughter is caretaker.   -PT/OT to assess baseline, need to assistance.  11. Depression -Rx home Zoloft 50 mg qday   12. DVT px  -SCDs, Hep sq, Asprin 81mg  qday   13. F/E/N  -75 cc/hr NS  -NPO-will need gluten free diet when able to tolerate po   Dispo: Disposition is deferred at this time, awaiting improvement of current medical problems. Anticipated discharge in approximately 1-2 day(s).  The patient does have a  current PCP (GARG, ANKIT, MD), therefore {will berequiring OPC follow-up after discharge.  The patient does not have transportation limitations that hinder transportation to clinic appointments.  This is a Psychologist, occupational Note.  The care of the patient was discussed with Dr. Burtis Junes and the assessment and plan was formulated with their assistance.  Please see their note for official documentation of the patient encounter.   Signed: Balinda Quails 09/23/2012, 1:14 PM

## 2012-09-23 NOTE — ED Notes (Signed)
Admit Doctor at bedside.  

## 2012-09-23 NOTE — ED Notes (Signed)
Pt tolerated in and out cath by this RN and Femi, tech

## 2012-09-23 NOTE — Progress Notes (Signed)
Spoke with admitting MD Dr. Dierdre Searles. Its ok to send pt to a med-surg bed.

## 2012-09-23 NOTE — ED Provider Notes (Signed)
History     CSN: [id number]  Arrival date & time 09/23/12  0941   First MD Initiated Contact with Patient 09/23/12 1010      Chief Complaint  Patient presents with  . Diarrhea  . Nausea  . Fever  . Cough    (Consider location/radiation/quality/duration/timing/severity/associated sxs/prior treatment) The history is provided by the patient and a relative.  Rachel Bridges is a 79 y.o. female hx of celiac disease with chronic diarrhea, dementia here with dehydration, nausea, diarrhea. As per daughter she has been feeling nauseous and had not eaten anything for the last 5 days. No vomiting as per daughter. She's been having worse diarrhea for the last few days. She has chronic diarrhea but is more frequent than usual. No history of C diff or recent abx use. She also had a fever 102 yesterday and has been having some nonproductive cough. As per daughter, she has hx of SBO but her abdomen is not more distended than usual.   Level V caveat- dementia    Past Medical History  Diagnosis Date  . MGUS (monoclonal gammopathy of unknown significance)      [facility]  . Anxiety   . History of colon cancer      History of bowel  obstruction. on treatment with Colace and MiraLax.  followed by Dr. Loreta Ave and Dr. Purnell Shoemaker  . Depression   . Diverticulosis of colon   . Osteopenia   . Lumbar spinal stenosis   . Atrophic vaginitis   . Renal cyst   . History of tobacco abuse     at least 64 pack year. Started about age 44 and continued until about age 37.  Marland Kitchen Dermatitis   . Hip pain      History  . Actinic keratosis   . Dementia   . Adrenal suppression     central, on chronic steroids, followed by Dr. Sharl Ma of endocrinology  . Gluten intolerance     August 2011 endoscopy with biopsy negative for celiac disease. Prior to this patient had elevated TTG IgG of 93 (normal <20). Dr Loreta Ave made clinical diagnosis of gluten intolerance based on diarrhea/symptoms from  eating gluten. Needs GF diet.     Past Surgical History  Procedure Laterality Date  . Hemicolectomy       Right-sided  .  resection of ovarian cyst    . Cataract extraction       Left eye  . Knee arthroscopy       Left knee  . Appendectomy    . Carpal tunnel release    . Lumbar laminectomy    . Lumbar fusion    . Replacement total knee      left knee    No family history on file.  History  Substance Use Topics  . Smoking status: Former Smoker -- 1.00 packs/day for 24 years    Types: Cigarettes    Quit date: 08/01/1988  . Smokeless tobacco: Not on file  . Alcohol Use: No    OB History   Grav Para Term Preterm Abortions TAB SAB Ect Mult Living                  Review of Systems  Constitutional: Positive for fever.  Respiratory: Positive for cough.   Gastrointestinal: Positive for diarrhea.  All other systems reviewed and are negative.    Allergies  Morphine and related; Oxycodone hcl; and Pantoprazole  Home Medications  Current Outpatient Rx  Name  Route  Sig  Dispense  Refill  . acetaminophen (TYLENOL) 325 MG tablet   Oral   Take 325 mg by mouth once.         Marland Kitchen aspirin 81 MG tablet   Oral   Take 81 mg by mouth daily.          . calcium citrate-vitamin D (CITRACAL+D) 315-200 MG-UNIT per tablet   Oral   Take 1 tablet by mouth 2 (two) times daily.   60 tablet   11   . cyanocobalamin (,VITAMIN B-12,) 1000 MCG/ML injection   Intramuscular   Inject 1 mL (1,000 mcg total) into the muscle every 30 (thirty) days.   1 mL   11   . feeding supplement (ENSURE COMPLETE) LIQD   Oral   Take 237 mLs by mouth daily. Vanilla.  Hasn't been giving since the sickness started.         . furosemide (LASIX) 20 MG tablet   Oral   Take 1 tablet (20 mg total) by mouth daily.   45 tablet   5     Take 1 extra tab(total 2) lasix dose for 5 days wh ...   . hydrocortisone (CORTEF) 10 MG tablet   Oral   Take 5-10 mg by mouth 2 (two) times daily. 10 mg in the  a.m. And 5 mg in the afternoon.         . ondansetron (ZOFRAN ODT) 8 MG disintegrating tablet   Oral   Take 1 tablet (8 mg total) by mouth every 8 (eight) hours as needed for nausea.   10 tablet   0   . ondansetron (ZOFRAN) 4 MG tablet   Oral   Take 1 tablet (4 mg total) by mouth every 8 (eight) hours as needed for nausea. Nausea.   30 tablet   1   . paricalcitol (ZEMPLAR) 1 MCG capsule   Oral   Take 1 mcg by mouth every Monday, Wednesday, and Friday.         . potassium chloride (K-DUR) 10 MEQ tablet   Oral   Take 20 mEq by mouth 2 (two) times daily.         . sertraline (ZOLOFT) 50 MG tablet   Oral   Take 1 tablet (50 mg total) by mouth daily.   30 tablet   5   . simethicone (MYLICON) 80 MG chewable tablet   Oral   Chew 160 mg by mouth once.           BP 98/52  Pulse 61  Temp(Src) 98.5 F (36.9 C) (Oral)  Resp 15  Wt 128 lb (58.06 kg)  BMI 20.35 kg/m2  SpO2 98%  Physical Exam  Nursing note and vitals reviewed. Constitutional:  Chronically ill, dehydrated.   HENT:  Head: Normocephalic.  MM dry  Eyes: Conjunctivae are normal. Pupils are equal, round, and reactive to light.  Neck: Normal range of motion. Neck supple.  Cardiovascular: Normal rate, regular rhythm and normal heart sounds.   Pulmonary/Chest: Effort normal and breath sounds normal. No respiratory distress. She has no wheezes. She has no rales.  Abdominal: Soft.  Slightly distended, nontender   Musculoskeletal: Normal range of motion.  Neurological: She is alert.  Skin: Skin is dry.  Poor skin turgor   Psychiatric: She has a normal mood and affect. Her behavior is normal. Judgment and thought content normal.    ED Course  Procedures (including critical care time)  CRITICAL CARE Performed by: Silverio Lay, Jerric Oyen   Total critical care time: 30 min   Critical care time was exclusive of separately billable procedures and treating other patients.  Critical care was necessary to treat or  prevent imminent or life-threatening deterioration.  Critical care was time spent personally by me on the following activities: development of treatment plan with patient and/or surrogate as well as nursing, discussions with consultants, evaluation of patient's response to treatment, examination of patient, obtaining history from patient or surrogate, ordering and performing treatments and interventions, ordering and review of laboratory studies, ordering and review of radiographic studies, pulse oximetry and re-evaluation of patient's condition.   Labs Reviewed  CBC WITH DIFFERENTIAL - Abnormal; Notable for the following:    Hemoglobin 11.7 (*)    Platelets 147 (*)    All other components within normal limits  COMPREHENSIVE METABOLIC PANEL - Abnormal; Notable for the following:    Potassium 3.0 (*)    Glucose, Bld 121 (*)    BUN 27 (*)    Creatinine, Ser 1.65 (*)    Calcium 8.0 (*)    Albumin 2.5 (*)    Total Bilirubin 0.2 (*)    GFR calc non Af Amer 27 (*)    GFR calc Af Amer 32 (*)    All other components within normal limits  AMYLASE  LIPASE, BLOOD  URINALYSIS, ROUTINE W REFLEX MICROSCOPIC  CG4 I-STAT (LACTIC ACID)  POCT I-STAT TROPONIN I   Dg Abd Acute W/chest  09/23/2012   *RADIOLOGY REPORT*  Clinical Data: Abdominal pain, diarrhea, past history of colon cancer, smoking  ACUTE ABDOMEN SERIES (ABDOMEN 2 VIEW & CHEST 1 VIEW)  Comparison: 06/01/2012  Findings: Normal heart size and pulmonary vascularity. Tortuous aorta. Lungs emphysematous but clear. No pleural effusion or pneumothorax. Gaseous distention of colon with bowel interposition between liver and diaphragm. No definite bowel wall thickening or free intraperitoneal air. Distended air filled loops of small bowel are also visualized. Osseous demineralization with prior lumbar fusion. Numerous pelvic phleboliths.  IMPRESSION: Mild gaseous distention of large and small bowel loops in abdomen without discrete focal point of  obstruction identified. Findings could represent colonic obstruction or ileus; if further imaging is clinically indicated, consider computed tomography of the abdomen and pelvis with IV and oral contrast.   Original Report Authenticated By: Ulyses Southward, M.D.     No diagnosis found.   Date: 09/23/2012  Rate: 64  Rhythm: normal sinus rhythm  QRS Axis: normal  Intervals: normal  ST/T Wave abnormalities: nonspecific ST changes  Conduction Disutrbances:right bundle branch block  Narrative Interpretation:   Old EKG Reviewed: unchanged    MDM  Rachel Bridges is a 78 y.o. female here with nausea, dec PO intake, diarrhea, hypotension. Hypotension likely from dehydration but will need to r/o sepsis as well. Will get lactate, UA, CXR. Will fluid resuscitate. Will likely need admission.   12:02 PM She has acute on chronic renal failure. Has ileus on xray but no obvious SBO. BP still in 90s after NS 1 L. Will continue to hydrate and admit to medicine. I called Dr. Nedra Hai from internal medicine, who will admit patient.         Richardean Canal, MD 09/23/12 [phone]

## 2012-09-23 NOTE — ED Notes (Addendum)
Family member reports pt has had flu like symptoms for several days.  Decreased PO intake. + diarrhea.  Dry skin turgor.  Old bruising to the right hip.  Family reports they had the flu this past week

## 2012-09-23 NOTE — H&P (Signed)
Hospital Admission Note Date: 09/23/2012  Patient name: Rachel Bridges Medical record number: [id number] Date of birth: 1924/08/21 Age: 79 y.o. Gender: female PCP: Lars Mage, MD  Medical Service: IMTS  Attending physician: Dr. Rogelia Boga    1st Contact: Dr. Burtis Junes   Pager: [phone] 2nd Contact: Dr. Dierdre Searles    Pager: After 5 pm or weekends: 1st Contact:      Pager: [phone] 2nd Contact:      Pager: [phone]  Chief Complaint: nausea/vomiting  History of Present Illness: Rachel Bridges is 79 yo Caucasian woman pmh AI followed by Dr. Sharl Ma, Celiac disease s/p colectomy and non-compliant with gluten dietary restrictions, MGUS p/w ongoing abdominal pain, nausea, vomiting, and diarrhea over the past 3 days. Pt states daughter had similar symptoms 1 wk prior. Pt has chronic problems with diarrhea and constipation along with hypotension. Pt has had some intermittent presyncopal episodes but no falls or LOC. Pt is usually semi independent in terms of ADLs and has been ambulating fine with a cane until it was lost around 1 wk ago. Pt denied any CP, SOB, DOE, palpitations, syncope, or fevers. Pt has been compliant with medications but tends to indulge in regular breads and pastas and doesn't seem to understand or know she is on a restricted diet. Her daughter is the one who organizes her medications and her meals. Pt was a former smoker, doesn't drink etoh, or use any other drugs.   Meds: No current facility-administered medications for this encounter.   Current Outpatient Prescriptions  Medication Sig Dispense Refill  . acetaminophen (TYLENOL) 325 MG tablet Take 325 mg by mouth once.      Marland Kitchen aspirin 81 MG tablet Take 81 mg by mouth daily.       . calcium citrate-vitamin D (CITRACAL+D) 315-200 MG-UNIT per tablet Take 1 tablet by mouth 2 (two) times daily.  60 tablet  11  . cyanocobalamin (,VITAMIN B-12,) 1000 MCG/ML injection Inject 1 mL (1,000 mcg total) into the muscle every 30 (thirty) days.  1 mL  11  .  feeding supplement (ENSURE COMPLETE) LIQD Take 237 mLs by mouth daily. Vanilla.  Hasn't been giving since the sickness started.      . furosemide (LASIX) 20 MG tablet Take 1 tablet (20 mg total) by mouth daily.  45 tablet  5  . hydrocortisone (CORTEF) 10 MG tablet Take 5-10 mg by mouth 2 (two) times daily. 10 mg in the a.m. And 5 mg in the afternoon.      . ondansetron (ZOFRAN ODT) 8 MG disintegrating tablet Take 1 tablet (8 mg total) by mouth every 8 (eight) hours as needed for nausea.  10 tablet  0  . ondansetron (ZOFRAN) 4 MG tablet Take 1 tablet (4 mg total) by mouth every 8 (eight) hours as needed for nausea. Nausea.  30 tablet  1  . paricalcitol (ZEMPLAR) 1 MCG capsule Take 1 mcg by mouth every Monday, Wednesday, and Friday.      . potassium chloride (K-DUR) 10 MEQ tablet Take 20 mEq by mouth 2 (two) times daily.      . sertraline (ZOLOFT) 50 MG tablet Take 1 tablet (50 mg total) by mouth daily.  30 tablet  5  . simethicone (MYLICON) 80 MG chewable tablet Chew 160 mg by mouth once.        Allergies: Allergies as of 09/23/2012 - Review Complete 09/23/2012  Allergen Reaction Noted  . Morphine and related Other (See Comments) 03/02/2011  . Oxycodone hcl (oxycodone hcl) Other (  See Comments) 03/02/2011  . Pantoprazole (pantoprazole sodium) Nausea Only 03/02/2011   Past Medical History  Diagnosis Date  . MGUS (monoclonal gammopathy of unknown significance)      [facility]  . Anxiety   . History of colon cancer      History of bowel  obstruction. on treatment with Colace and MiraLax.  followed by Dr. Loreta Ave and Dr. Purnell Shoemaker  . Depression   . Diverticulosis of colon   . Osteopenia   . Lumbar spinal stenosis   . Atrophic vaginitis   . Renal cyst   . History of tobacco abuse     at least 64 pack year. Started about age 16 and continued until about age 37.  Marland Kitchen Dermatitis   . Hip pain      History  . Actinic keratosis   . Dementia   . Adrenal  suppression     central, on chronic steroids, followed by Dr. Sharl Ma of endocrinology  . Gluten intolerance     August 2011 endoscopy with biopsy negative for celiac disease. Prior to this patient had elevated TTG IgG of 93 (normal <20). Dr Loreta Ave made clinical diagnosis of gluten intolerance based on diarrhea/symptoms from eating gluten. Needs GF diet.    Past Surgical History  Procedure Laterality Date  . Hemicolectomy       Right-sided  .  resection of ovarian cyst    . Cataract extraction       Left eye  . Knee arthroscopy       Left knee  . Appendectomy    . Carpal tunnel release    . Lumbar laminectomy    . Lumbar fusion    . Replacement total knee      left knee   No family history on file. History   Social History  . Marital Status: Widowed    Spouse Name: N/A    Number of Children: N/A  . Years of Education: N/A   Occupational History  . Not on file.   Social History Main Topics  . Smoking status: Former Smoker -- 1.00 packs/day for 24 years    Types: Cigarettes    Quit date: 08/01/1988  . Smokeless tobacco: Not on file  . Alcohol Use: No  . Drug Use: No  . Sexually Active: No   Other Topics Concern  . Not on file   Social History Narrative    Husband died of myeloma.    Patient is to daughter's one of whom she lives with her.    Review of Systems: A comprehensive review of systems was negative.  Physical Exam: Blood pressure 124/63, pulse 64, temperature 98.5 F (36.9 C), temperature source Oral, resp. rate 16, weight 128 lb (58.06 kg), SpO2 97.00%. General appearance: alert, cooperative, appears older than stated age, cachectic, no distress and pale Head: Normocephalic, without obvious abnormality, atraumatic Eyes: conjunctivae/corneas clear. PERRL, EOM's intact. Fundi benign. Throat: lips, mucosa, and tongue normal; teeth and gums normal and dry MM, no PND, no oral lesions Lungs: clear to auscultation bilaterally and decreased effort Heart: regular  rate and rhythm, S1, S2 normal, no murmur, click, rub or gallop Abdomen: soft, non-tender; bowel sounds normal; no masses,  no organomegaly Extremities: edema none, varicose veins noted and diffuse ecchymosis Skin: Skin color, texture, turgor normal. No rashes or lesions or very fragile and thin  Lab results: Basic Metabolic Panel:  Recent Labs  16/10/96 1027  NA 142  K 3.0*  CL 108  CO2 19  GLUCOSE 121*  BUN 27*  CREATININE 1.65*  CALCIUM 8.0*   Liver Function Tests:  Recent Labs  09/23/12 1027  AST 18  ALT 11  ALKPHOS 95  BILITOT 0.2*  PROT 6.3  ALBUMIN 2.5*    Recent Labs  09/23/12 1027  LIPASE 30  AMYLASE 23   CBC:  Recent Labs  09/23/12 1027  WBC 6.2  NEUTROABS 4.3  HGB 11.7*  HCT 36.2  MCV 85.8  PLT 147*   Urinalysis:  Recent Labs  09/23/12 1210  COLORURINE YELLOW  LABSPEC 1.017  PHURINE 5.5  GLUCOSEU NEGATIVE  HGBUR NEGATIVE  BILIRUBINUR SMALL*  KETONESUR NEGATIVE  PROTEINUR 30*  UROBILINOGEN 0.2  NITRITE NEGATIVE  LEUKOCYTESUR NEGATIVE    Imaging results:  Dg Abd Acute W/chest  09/23/2012   *RADIOLOGY REPORT*  Clinical Data: Abdominal pain, diarrhea, past history of colon cancer, smoking  ACUTE ABDOMEN SERIES (ABDOMEN 2 VIEW & CHEST 1 VIEW)  Comparison: 06/01/2012  Findings: Normal heart size and pulmonary vascularity. Tortuous aorta. Lungs emphysematous but clear. No pleural effusion or pneumothorax. Gaseous distention of colon with bowel interposition between liver and diaphragm. No definite bowel wall thickening or free intraperitoneal air. Distended air filled loops of small bowel are also visualized. Osseous demineralization with prior lumbar fusion. Numerous pelvic phleboliths.  IMPRESSION: Mild gaseous distention of large and small bowel loops in abdomen without discrete focal point of obstruction identified. Findings could represent colonic obstruction or ileus; if further imaging is clinically indicated, consider computed  tomography of the abdomen and pelvis with IV and oral contrast.   Original Report Authenticated By: Ulyses Southward, M.D.     Assessment & Plan by Problem: 1. Chronic Ileus complicated by recent GE vs viral illness: Given pts hx of recent nausea/vomiting/dearrhea and similar illness in daughter indicating probably viral GE. Pt doesn't meet SIRS criteria. Lipase and amylase were normal. -c. Diff pcr -TSH -UA and UCx -influenza panel  2. Hypotension: Pt origionally hypotensive with SBP 80s this responded well to IVF and likely 2/2 decreased po intake given recent nausea/vomiting and diarrhea.  -cont to monitor -NS 125 cc -repeat orthostatics  3. Acute on chronic kidney injury: Pt Cr 1.0 baseline and 1.65 today likely prerenal in setting of dehydration and decreased PO intake.  -IVF -cont to monitor  4. Hypokalemia: K 3.0 in setting of diarrhea.  -replaced K -cont to monitor  5. Adrenal insufficiency: Pt has hx of AI on chronic prednisone  -cont prednisone -AM cortisol  6. Failure to thrive: Pt cachectic and low albumin 2.5.   Dispo: Disposition is deferred at this time, awaiting improvement of current medical problems. Anticipated discharge in approximately 1-2 day(s).   The patient does have a current PCP (GARG, ANKIT, MD), therefore will be requiring [facility]PC follow-up after discharge.   The patient does not have transportation limitations that hinder transportation to clinic appointments.  Signed: Christen Bame 09/23/2012, 3:25 PM  Pgr: [phone]

## 2012-09-24 LAB — BASIC METABOLIC PANEL
BUN: 21 mg/dL (ref 6–23)
CO2: 18 mEq/L — ABNORMAL LOW (ref 19–32)
Creatinine, Ser: 1.39 mg/dL — ABNORMAL HIGH (ref 0.50–1.10)
GFR calc non Af Amer: 34 mL/min — ABNORMAL LOW (ref >90)
Glucose, Bld: 76 mg/dL (ref 70–99)
Potassium: 3.4 mEq/L — ABNORMAL LOW (ref 3.5–5.1)
Sodium: 144 mEq/L (ref 135–145)

## 2012-09-24 LAB — INFLUENZA PANEL BY PCR (TYPE A & B)
H1N1 flu by pcr: NOT DETECTED
Influenza B By PCR: POSITIVE — AB

## 2012-09-24 LAB — CBC
HCT: 33 % — ABNORMAL LOW (ref 36.0–46.0)
Hemoglobin: 10.8 g/dL — ABNORMAL LOW (ref 12.0–15.0)
MCH: 28.4 pg (ref 26.0–34.0)
MCHC: 32.7 g/dL (ref 30.0–36.0)
MCV: 86.8 fL (ref 78.0–100.0)
Platelets: 100 10*3/uL — ABNORMAL LOW (ref 150–400)
RBC: 3.8 MIL/uL — ABNORMAL LOW (ref 3.87–5.11)
WBC: 3.1 10*3/uL — ABNORMAL LOW (ref 4.0–10.5)

## 2012-09-24 LAB — CREATININE, URINE, RANDOM: Creatinine, Urine: 85.2 mg/dL

## 2012-09-24 LAB — URINE CULTURE: Culture: NO GROWTH

## 2012-09-24 MED ORDER — HYDROCORTISONE 20 MG PO TABS
20.0000 mg | ORAL_TABLET | Freq: Every day | ORAL | Status: DC
  Administered 2012-09-24 – 2012-09-26 (×3): 20 mg via ORAL
  Filled 2012-09-24 (×4): qty 1

## 2012-09-24 MED ORDER — HYDROCORTISONE 10 MG PO TABS
10.0000 mg | ORAL_TABLET | Freq: Every day | ORAL | Status: DC
  Administered 2012-09-24 – 2012-09-26 (×3): 10 mg via ORAL
  Filled 2012-09-24 (×4): qty 1

## 2012-09-24 MED ORDER — SODIUM CHLORIDE 0.9 % IV SOLN
INTRAVENOUS | Status: DC
  Administered 2012-09-24 (×2): 75 mL/h via INTRAVENOUS
  Administered 2012-09-25: 10 mL/h via INTRAVENOUS

## 2012-09-24 MED ORDER — BOOST / RESOURCE BREEZE PO LIQD
1.0000 | Freq: Two times a day (BID) | ORAL | Status: DC
  Administered 2012-09-24 – 2012-09-27 (×7): 1 via ORAL

## 2012-09-24 MED ORDER — OSELTAMIVIR PHOSPHATE 75 MG PO CAPS
75.0000 mg | ORAL_CAPSULE | Freq: Every day | ORAL | Status: AC
  Administered 2012-09-24 – 2012-09-28 (×5): 75 mg via ORAL
  Filled 2012-09-24 (×5): qty 1

## 2012-09-24 NOTE — Progress Notes (Signed)
INITIAL NUTRITION ASSESSMENT  DOCUMENTATION CODES Per approved criteria  -Severe malnutrition in the context of chronic illness -Underweight   INTERVENTION: 1. Recommend Resource Breeze po BID, each supplement provides 250 kcal and 9 grams of protein. 2. Recommend MVI once advanced to diet. 3. RD to continue to follow nutrition care plan  NUTRITION DIAGNOSIS: Inadequate oral intake related to influenza as evidenced by dietary recall.   Goal: Intake to meet >90% of estimated nutrition needs.  Monitor:  weight trends, lab trends, I/O's, PO intake, supplement tolerance  Reason for Assessment: MD Consult for "Malnutrition"  79 y.o. female  Admitting Dx: Abdominal pain  ASSESSMENT: Hx of celiac disease, chronic diarrhea and dementia. Admitted with fever, cough, n/d. Family also notes that they have had the flu this past week. Pt has ileus on xray but no obvious SBO. Influenza B positive.  Family reports that pt has not been eating for 3-5 days, only sipping on water. Patient states that she does not adhere to gluten-free diet, but is not positive because daughter does the cooking in the household. Clear liquid diet ordered at this time. Pt to advance to Gluten Free diet when able to tolerate po's.  Pt reports that she is now much improved. Able to eat all of her foods this morning and is requesting more.  Per chart review, pt with 7% wt loss x 3 months. This is not significant for this time frame.  Nutrition Focused Physical Exam:  Subcutaneous Fat:  Orbital Region: mild-moderate depletion Upper Arm Region: severe depletion Thoracic and Lumbar Region: severe depletion  Muscle:  Temple Region: severe depletion Clavicle Bone Region: severe depletion Clavicle and Acromion Bone Region: severe depletion Scapular Bone Region: n/a Dorsal Hand: n/a Patellar Region: severe depletion Anterior Thigh Region: mild-moderate depletion Posterior Calf Region: mild-moderate  depletion  Edema: none  Pt meets criteria for severe MALNUTRITION in the context of chronic illness as evidenced by severe muscle and fat mass loss and intake of <50% x at least 5 days.  Height: Ht Readings from Last 1 Encounters:  09/23/12 5\' 7"  (1.702 m)    Weight: Wt Readings from Last 1 Encounters:  09/23/12 116 lb 10 oz (52.9 kg)    Ideal Body Weight: 135 lb  % Ideal Body Weight: 86%  Wt Readings from Last 10 Encounters:  09/23/12 116 lb 10 oz (52.9 kg)  06/15/12 123 lb 9.6 oz (56.065 kg)  02/25/12 122 lb 1.6 oz (55.384 kg)  01/03/12 123 lb 8 oz (56.019 kg)  12/27/11 141 lb 11.2 oz (64.275 kg)  12/23/11 119 lb (53.978 kg)  12/04/11 132 lb 11.2 oz (60.192 kg)  11/22/11 143 lb 1.3 oz (64.9 kg)  11/04/11 125 lb 8 oz (56.926 kg)  08/22/11 125 lb (56.7 kg)    Usual Body Weight: 125 lb  % Usual Body Weight: 93%  BMI:  Body mass index is 18.26 kg/(m^2). Underweight.  Estimated Nutritional Needs: Kcal: 1350 - 1550 Protein: 50 - 60 grams Fluid: at least 1.5 liters daily  Skin: intact  Diet Order: Clear Liquid  EDUCATION NEEDS: -No education needs identified at this time  No intake or output data in the 24 hours ending 09/24/12 0910  Last BM: 5/29 (diarrhea)  Labs:   Recent Labs Lab 09/23/12 1027 09/24/12 0540  NA 142 144  K 3.0* 3.4*  CL 108 117*  CO2 19 18*  BUN 27* 21  CREATININE 1.65* 1.39*  CALCIUM 8.0* 7.6*  GLUCOSE 121* 76    CBG (  last 3)  No results found for this basename: GLUCAP,  in the last 72 hours  Scheduled Meds: . aspirin EC  81 mg Oral Daily  . enoxaparin (LOVENOX) injection  30 mg Subcutaneous Q24H  . hydrocortisone  10 mg Oral Q1500  . hydrocortisone  20 mg Oral Daily  . oseltamivir  75 mg Oral Daily  . sertraline  50 mg Oral Daily  . sodium chloride  3 mL Intravenous Q12H  . sodium chloride  3 mL Intravenous Q12H    Continuous Infusions: . sodium chloride 75 mL/hr (09/24/12 0849)    Past Medical History   Diagnosis Date  . MGUS (monoclonal gammopathy of unknown significance)      [facility]  . Anxiety   . History of colon cancer      History of bowel  obstruction. on treatment with Colace and MiraLax.  followed by Dr. Loreta Ave and Dr. Purnell Shoemaker  . Depression   . Diverticulosis of colon   . Osteopenia   . Lumbar spinal stenosis   . Atrophic vaginitis   . Renal cyst   . History of tobacco abuse     at least 64 pack year. Started about age 79 and continued until about age 22.  Marland Kitchen Dermatitis   . Hip pain      History  . Actinic keratosis   . Dementia   . Adrenal suppression     central, on chronic steroids, followed by Dr. Sharl Ma of endocrinology  . Gluten intolerance     August 2011 endoscopy with biopsy negative for celiac disease. Prior to this patient had elevated TTG IgG of 93 (normal <20). Dr Loreta Ave made clinical diagnosis of gluten intolerance based on diarrhea/symptoms from eating gluten. Needs GF diet.     Past Surgical History  Procedure Laterality Date  . Hemicolectomy       Right-sided  .  resection of ovarian cyst    . Cataract extraction       Left eye  . Knee arthroscopy       Left knee  . Appendectomy    . Carpal tunnel release    . Lumbar laminectomy    . Lumbar fusion    . Replacement total knee      left knee    Jarold Motto MS, RD, LDN Pager: [phone] After-hours pager: [phone]

## 2012-09-24 NOTE — Telephone Encounter (Signed)
Agree with assessment. 

## 2012-09-24 NOTE — Progress Notes (Signed)
  Date: 09/24/2012  Patient name: Rachel Bridges  Medical record number: [id number]  Date of birth: [birth year]   This patient has been seen and the plan of care was discussed with the house staff. Please see their note for complete details. I concur with their findings with the following additions/corrections: Pt's flu came back +. She still feels poorly and lethargic but unable to give more speficis. Seems to indicate that did have fecal incontinence (old problem) last PM. Gap closed to 9. BP stil lsoft but will start stress dose steroids and cont IVF. Cont symptomatic tx. Agree with stress dose steroids and PT/OT.  Burns Spain, MD 09/24/2012, 2:16 PM

## 2012-09-24 NOTE — Evaluation (Signed)
Physical Therapy Evaluation Patient Details Name: Rachel Bridges MRN: [id number] DOB: 1925/03/24 Today's Date: 09/24/2012 Time: 4098-1191 PT Time Calculation (min): 30 min  PT Assessment / Plan / Recommendation Clinical Impression  Pt is an 15 y. female admitted with abdominal pain and flu like symptoms. .  Pt will benefit from acute PT to progress mobility and activity tolerance for safe return to home.  Pt will require 24/7 supervision for d.c home.     PT Assessment  Patient needs continued PT services    Follow Up Recommendations  SNF;Supervision/Assistance - 24 hour    Does the patient have the potential to tolerate intense rehabilitation      Barriers to Discharge None      Equipment Recommendations  None recommended by PT    Recommendations for Other Services Rehab consult   Frequency Min 4X/week    Precautions / Restrictions Precautions Precautions: Fall;Other (comment) Precaution Comments: Contact Droplet Restrictions Weight Bearing Restrictions: No   Pertinent Vitals/Pain No c/o pain      Mobility  Bed Mobility Bed Mobility: Supine to Sit;Sitting - Scoot to [place] of Bed;Rolling Right;Rolling Left Rolling Right: 4: Min assist Rolling Left: 4: Min assist Supine to Sit: 4: Min assist Sitting - Scoot to [place] of Bed: 4: Min assist Details for Bed Mobility Assistance: Cues for hand placement and to rotate shoulders and hips.  Assist to raise shoulder from  bed.   Transfers Transfers: Sit to Stand;Stand to Sit Sit to Stand: 4: Min assist;From bed;With upper extremity assist Stand to Sit: 3: Mod assist;With armrests;To chair/3-in-1 Details for Transfer Assistance: Assist to steady pt secondary to LE weakness. Ambulation/Gait Ambulation/Gait Assistance: 3: Mod assist Ambulation Distance (Feet): 10 Feet Assistive device: Rolling walker Ambulation/Gait Assistance Details: Manual facilitation to maintain balance and manage rw Gait Pattern: Step-to  pattern;Decreased hip/knee flexion - left;Decreased hip/knee flexion - right;Decreased stride length;Festinating Stairs: No Wheelchair Mobility Wheelchair Mobility: No    Exercises     PT Diagnosis: Difficulty walking;Generalized weakness;Acute pain  PT Problem List: Decreased strength;Decreased activity tolerance;Decreased balance;Decreased mobility;Decreased knowledge of use of DME PT Treatment Interventions: DME instruction;Gait training;Functional mobility training;Therapeutic activities;Stair training;Therapeutic exercise;Balance training   PT Goals Acute Rehab PT Goals PT Goal Formulation: With patient Time For Goal Achievement: 10/15/12 Potential to Achieve Goals: Good Pt will go Supine/Side to Sit: with modified independence PT Goal: Supine/Side to Sit - Progress: Goal set today Pt will go Sit to Supine/Side: with supervision PT Goal: Sit to Supine/Side - Progress: Goal set today Pt will go Stand to Sit: with supervision PT Goal: Stand to Sit - Progress: Goal set today Pt will Transfer Bed to Chair/Chair to Bed: with supervision PT Transfer Goal: Bed to Chair/Chair to Bed - Progress: Goal set today Pt will Ambulate: 16 - 50 feet;with min assist;with least restrictive assistive device PT Goal: Ambulate - Progress: Goal set today  Visit Information  Last PT Received On: 09/24/12 Assistance Needed: +1    Subjective Data  Subjective: agree to PT eval.   Prior Functioning  Home Living Lives With: Other (Comment) (sister) Available Help at Discharge: Family;Available 24 hours/day Type of Home: House Home Access: Stairs to enter [employer] of Steps: 3 Entrance Stairs-Rails: Right Home Layout: One level Home Adaptive Equipment: Walker - rolling;Straight cane Prior Function Level of Independence: Independent with assistive device(s) Able to Take Stairs?: Yes Driving: No Vocation: Retired    Copywriter, advertising Arousal/Alertness: Awake/alert Behavior  During Therapy: WFL for tasks assessed/performed Overall Cognitive  Status: History of cognitive impairments - at baseline    Extremity/Trunk Assessment Right Lower Extremity Assessment RLE ROM/Strength/Tone: Deficits RLE ROM/Strength/Tone Deficits: gross LE strength 4/5  Left Lower Extremity Assessment LLE ROM/Strength/Tone: Deficits LLE ROM/Strength/Tone Deficits: Gross LE strength 4/5   Balance    End of Session PT - End of Session Equipment Utilized During Treatment: Gait belt Activity Tolerance: Patient limited by pain;Patient limited by fatigue Patient left: in chair;with call bell/phone within reach  GP     [facility] 09/24/2012, 3:37 PM Domingo Fuson L. Chilton Si, DPT   Pager [phone]     Cell [phone]

## 2012-09-24 NOTE — Progress Notes (Signed)
CRITICAL VALUE ALERT  Critical value received:  Influenza B PCR Positive   Date of notification:  05/29  Time of notification:  08:15  Critical value read back:yes  Nurse who received alert:  Melvyn Novas  MD notified (1st page):  Sadek  Time of first page:  08:17  MD notified (2nd page):  Time of second page:  Responding MD:  Burtis Junes  Time MD responded:  08:19

## 2012-09-24 NOTE — Progress Notes (Signed)
Medical Student Daily Progress Note  Subjective: NAEO  Patient reports that she is much improved.  She was able to eat all her food and was requesting more.  Patient states that nausea has improved.  Denies vomiting.  Denies abdominal pain, SOB.  States that she continues to have watery stools, but is unsure. Objective: Vital signs in last 24 hours: Filed Vitals:   09/23/12 1745 09/23/12 1951 09/24/12 0443 09/24/12 0947  BP: 101/73 127/68 109/58 89/45  Pulse: 62 57 56 58  Temp:  98 F (36.7 C) 98.7 F (37.1 C) 98 F (36.7 C)  TempSrc:  Oral Oral Axillary  Resp: 22 22 22 19   Height:  5\' 7"  (1.702 m)    Weight:  52.9 kg (116 lb 10 oz)    SpO2: 97% 99% 95% 95%   Weight change:   Intake/Output Summary (Last 24 hours) at 09/24/12 0953 Last data filed at 09/24/12 0900  Gross per 24 hour  Intake    560 ml  Output      0 ml  Net    560 ml   Physical Exam: Vitals reviewed.  General: elderly, pleasant woman resting in bed, NAD  HEENT: Pharynx Pink and moist, no erythema, and no exudates.  Cardiac: Regular Rate and Rhythm, no rubs, murmurs or gallops. No JVD appreciated  Pulm: No increased WOB. Clear to auscultation bilaterally, no wheezes, rales, or rhonchi  Abd: abdomen soft, nontender, nondistended, with normoactive BS  Ext: No edema, with poor skin turgor. Has ecchymosis over L knee, no joint effusions, pain or decreased ROM.  Neuro: alert and oriented to person and place, not to date.  Stable.  Cranial nerves II-XII grossly intact, strength and sensation to light touch equal in bilateral upper and lower extremities. Normally interactive, good eye contact, not anxious appearing, and not depressed appearing.  Lab Results:  Recent Labs Lab 09/23/12 1027 09/24/12 0540  NA 142 144  K 3.0* 3.4*  CL 108 117*  CO2 19 18*  GLUCOSE 121* 76  BUN 27* 21  CREATININE 1.65* 1.39*  CALCIUM 8.0* 7.6*  MG  --  1.4*    Liver Function Tests:  Recent Labs Lab 09/23/12 1027  AST 18   ALT 11  ALKPHOS 95  BILITOT 0.2*  PROT 6.3  ALBUMIN 2.5*    Recent Labs Lab 09/23/12 1027  LIPASE 30  AMYLASE 23   No results found for this basename: AMMONIA,  in the last 168 hours  CBC:  Recent Labs Lab 09/23/12 1027 09/24/12 0540  WBC 6.2 3.1*  NEUTROABS 4.3  --   HGB 11.7* 10.8*  HCT 36.2 33.0*  MCV 85.8 86.8  PLT 147* 100*    Cardiac Enzymes: No results found for this basename: CKTOTAL, CKMB, CKMBINDEX, TROPONINI,  in the last 168 hours  BNP: BNP (last 3 results) No results found for this basename: PROBNP,  in the last 8760 hours  Urinalysis    Component Value Date/Time   COLORURINE YELLOW 09/23/2012 1210   APPEARANCEUR CLOUDY* 09/23/2012 1210   LABSPEC 1.017 09/23/2012 1210   PHURINE 5.5 09/23/2012 1210   GLUCOSEU NEGATIVE 09/23/2012 1210   HGBUR NEGATIVE 09/23/2012 1210   BILIRUBINUR SMALL* 09/23/2012 1210   KETONESUR NEGATIVE 09/23/2012 1210   PROTEINUR 30* 09/23/2012 1210   UROBILINOGEN 0.2 09/23/2012 1210   NITRITE NEGATIVE 09/23/2012 1210   LEUKOCYTESUR NEGATIVE 09/23/2012 1210    Micro Results: No results found for this or any previous visit (from the past 240 hour(s)).  Studies/Results: Dg Abd Acute W/chest  09/23/2012   *RADIOLOGY REPORT*  Clinical Data: Abdominal pain, diarrhea, past history of colon cancer, smoking  ACUTE ABDOMEN SERIES (ABDOMEN 2 VIEW & CHEST 1 VIEW)  Comparison: 06/01/2012  Findings: Normal heart size and pulmonary vascularity. Tortuous aorta. Lungs emphysematous but clear. No pleural effusion or pneumothorax. Gaseous distention of colon with bowel interposition between liver and diaphragm. No definite bowel wall thickening or free intraperitoneal air. Distended air filled loops of small bowel are also visualized. Osseous demineralization with prior lumbar fusion. Numerous pelvic phleboliths.  IMPRESSION: Mild gaseous distention of large and small bowel loops in abdomen without discrete focal point of obstruction identified. Findings  could represent colonic obstruction or ileus; if further imaging is clinically indicated, consider computed tomography of the abdomen and pelvis with IV and oral contrast.   Original Report Authenticated By: Ulyses Southward, M.D.   Medications: I have reviewed the patient's current medications. Scheduled Meds: . aspirin EC  81 mg Oral Daily  . enoxaparin (LOVENOX) injection  30 mg Subcutaneous Q24H  . hydrocortisone  10 mg Oral Q1500  . hydrocortisone  20 mg Oral Daily  . oseltamivir  75 mg Oral Daily  . sertraline  50 mg Oral Daily  . sodium chloride  3 mL Intravenous Q12H  . sodium chloride  3 mL Intravenous Q12H   Continuous Infusions: . sodium chloride 75 mL/hr (09/24/12 0849)   PRN Meds:.sodium chloride, acetaminophen, acetaminophen, ondansetron (ZOFRAN) IV, ondansetron, sodium chloride Assessment/Plan: Principal Problem:   Abdominal pain Active Problems:   VITAMIN B12 DEFICIENCY   DEPRESSION   Adult failure to thrive   Chronic kidney disease   Adrenal insufficiency   Gluten intolerance   Ileus   AKI (acute kidney injury)   Hypokalemia   Hypotension  79 y.o woman presenting with N/V/D and decreased PO.   1.Viral illness vs Gastroenteritis complicated 2/2 chronic ileus. Pt admitted for similar symptoms 2/14. This has persisted for six days. In addition she has had nausea, vomiting that has been presistant, and now decreased po intake. Symptoms and nausea and vomiting and daughter that had recent illness could suggest influenza, but unlikely given chronic course without symptom of myalgias, etc. Loose stool could indicate C. Diff, but this appears to be a chronic issue. She currently is hemodynamically stable without fever, tachycardia. Urinalysis does not demonstrate infection. Amylase and lipases normal. Does not meet SIRS criteria  - Admit to floor.  - Acute Abdomen Series w/ chest: Mild gaseous distention of large and small bowel loops in abdomen without discrete focal point of  obstruction identified. Findings could represent colonic obstruction or ileus; if further imaging is clinically indicated, consider computed tomography of the abdomen and pelvis with IV and oral contrast.  - C. Diff stool PCR pending - influenza PCR: influenza B positive, H1N1 negative - am CBC and BMET - Urine Cr, Urine BUN pending -TSH: 4.199 - UA wnl and UCx pending - I/Os  - lactic acid 1.50  - POC troponin negative - Rx Zofran 4 mg q8 prn iv, acetaminophen 650 mg, NS 75 cc/hr,  - Tylenol 650 mg prn  - Nutrition consulted  - Will consider GI consult   2. Hypotension. Likely 2/2 to decreased po and dehydration. SBPs 80s that responded to IVF. Resolved. -hydrating with NS at 89ml/hr, concern for hx pulmonary edema  -consider obtain orthostatics  -Consult PT/OT   3. Acute on chronic renal failure. Most likely pre-renal to GI loss and decreased po intake.  -  monitor Cr 1.65-->1.39 trending down.  Likely 2/2 to fluid resuscitation.   Ilda Foil pending   4. Hypokalemia: K 3.0 likely 2/2 to diarrhea on admission -K: 3.0-->3.4 -replace K  -daily BMET   5. Anion Gap Metabolic Acidosis - no delta delta. Likely 2/2 to decrease po intake or renal failure.  -monitor BMET   6. Adrenal suppression. Her home dose was Cortef 10 mg qam and 5 mg qpm  -Increase Cortef home dose from 10 mg qam and 5 mg qpm to 20 qac and 10 qhs -am cortisol pending   7. Severe protein calorie malnutrition Albumin 2.5. Patient is cachetic .Likely multifactorial with component of gluten and lactose intolerance, gut dysmotility, recurrent diarrhea, possible chronic intestinal pseudo-obstruction. Patient's reports decreased po intake  - Nutritional services consulted   8. Hx Gluten intolerance  -when pt can tolerate po start gluten free diet, advance as tolerated  9. Hx Chronic normocytic anemia  Likely anemia of chronic disease. Has h/o of B12 deficiency which is treated w monthly injections, last level within  normal limits. Hgb 11.7 on admission, baseline.  - Hgb 10.8, decrease likely 2/2 to fluid replacement, monitor    10. Hx Dementia Alert and oriented to person and place only. Cannot repeat 3 words when asked. Cannot recall president. Unknown baseline. Daughter is caretaker.  -PT/OT to assess baseline, need to assistance.   11. Depression  -Rx home Zoloft 50 mg qday   12. DVT px  -SCDs, Hep sq, Asprin 81mg  qday   13. F/E/N  -75 cc/hr NS  -Thin liquids-will need gluten free diet when able to tolerate po.  Advance as tolerated   Dispo: Disposition is deferred at this time, awaiting improvement of current medical problems. Anticipated discharge in approximately 1-2 day(s).   The patient does have a current PCP (GARG, ANKIT, MD), therefore {will berequiring OPC follow-up after discharge.  The patient does not have transportation limitations that hinder transportation to clinic appointments.    LOS: 1 day   This is a Psychologist, occupational Note.  The care of the patient was discussed with Dr. Burtis Junes and the assessment and plan formulated with their assistance.  Please see their attached note for official documentation of the daily encounter.  Balinda Quails 09/24/2012, 9:53 AM

## 2012-09-24 NOTE — Progress Notes (Signed)
Subjective: Pt is doing very well this AM. Tolerating advanced diet. No complaints this AM. Some diarrhea overnight nothing above baseline.   Objective: Vital signs in last 24 hours: Filed Vitals:   09/23/12 1745 09/23/12 1951 09/24/12 0443 09/24/12 0947  BP: 101/73 127/68 109/58 89/45  Pulse: 62 57 56 58  Temp:  98 F (36.7 C) 98.7 F (37.1 C) 98 F (36.7 C)  TempSrc:  Oral Oral Axillary  Resp: 22 22 22 19   Height:  5\' 7"  (1.702 m)    Weight:  116 lb 10 oz (52.9 kg)    SpO2: 97% 99% 95% 95%   Weight change:   Intake/Output Summary (Last 24 hours) at 09/24/12 1034 Last data filed at 09/24/12 0900  Gross per 24 hour  Intake    560 ml  Output      0 ml  Net    560 ml   General: elderly, pleasant woman resting in bed, NAD  HEENT: Pharynx Pink and moist, no erythema, and no exudates.  Cardiac: Regular Rate and Rhythm, no rubs, murmurs or gallops. No JVD appreciated  Pulm: No increased WOB. Clear to auscultation bilaterally, no wheezes, rales, or rhonchi  Abd: abdomen soft, nontender, nondistended, with normoactive BS  Ext: No edema, with poor skin turgor. Has ecchymosis over L knee, no joint effusions, pain or decreased ROM.  Neuro: alert and oriented to person and place, not to date. Stable. Cranial nerves II-XII grossly intact, strength and sensation to light touch equal in bilateral upper and lower extremities. Normally interactive, good eye contact, not anxious appearing, and not depressed appearing.  Lab Results: Basic Metabolic Panel:  Recent Labs Lab 09/23/12 1027 09/24/12 0540  NA 142 144  K 3.0* 3.4*  CL 108 117*  CO2 19 18*  GLUCOSE 121* 76  BUN 27* 21  CREATININE 1.65* 1.39*  CALCIUM 8.0* 7.6*  MG  --  1.4*   Liver Function Tests:  Recent Labs Lab 09/23/12 1027  AST 18  ALT 11  ALKPHOS 95  BILITOT 0.2*  PROT 6.3  ALBUMIN 2.5*    Recent Labs Lab 09/23/12 1027  LIPASE 30  AMYLASE 23   CBC:  Recent Labs Lab 09/23/12 1027 09/24/12 0540   WBC 6.2 3.1*  NEUTROABS 4.3  --   HGB 11.7* 10.8*  HCT 36.2 33.0*  MCV 85.8 86.8  PLT 147* 100*   Thyroid Function Tests:  Recent Labs Lab 09/23/12 2043  TSH 4.199   Urinalysis:  Recent Labs Lab 09/23/12 1210  COLORURINE YELLOW  LABSPEC 1.017  PHURINE 5.5  GLUCOSEU NEGATIVE  HGBUR NEGATIVE  BILIRUBINUR SMALL*  KETONESUR NEGATIVE  PROTEINUR 30*  UROBILINOGEN 0.2  NITRITE NEGATIVE  LEUKOCYTESUR NEGATIVE   Micro Results: No results found for this or any previous visit (from the past 240 hour(s)). Studies/Results: Dg Abd Acute W/chest  09/23/2012   *RADIOLOGY REPORT*  Clinical Data: Abdominal pain, diarrhea, past history of colon cancer, smoking  ACUTE ABDOMEN SERIES (ABDOMEN 2 VIEW & CHEST 1 VIEW)  Comparison: 06/01/2012  Findings: Normal heart size and pulmonary vascularity. Tortuous aorta. Lungs emphysematous but clear. No pleural effusion or pneumothorax. Gaseous distention of colon with bowel interposition between liver and diaphragm. No definite bowel wall thickening or free intraperitoneal air. Distended air filled loops of small bowel are also visualized. Osseous demineralization with prior lumbar fusion. Numerous pelvic phleboliths.  IMPRESSION: Mild gaseous distention of large and small bowel loops in abdomen without discrete focal point of obstruction identified. Findings could  represent colonic obstruction or ileus; if further imaging is clinically indicated, consider computed tomography of the abdomen and pelvis with IV and oral contrast.   Original Report Authenticated By: Ulyses Southward, M.D.   Medications: I have reviewed the patient's current medications. Scheduled Meds: . aspirin EC  81 mg Oral Daily  . enoxaparin (LOVENOX) injection  30 mg Subcutaneous Q24H  . hydrocortisone  10 mg Oral Q1500  . hydrocortisone  20 mg Oral Daily  . oseltamivir  75 mg Oral Daily  . sertraline  50 mg Oral Daily  . sodium chloride  3 mL Intravenous Q12H  . sodium chloride  3 mL  Intravenous Q12H   Continuous Infusions: . sodium chloride 75 mL/hr (09/24/12 0849)   PRN Meds:.sodium chloride, acetaminophen, acetaminophen, ondansetron (ZOFRAN) IV, ondansetron, sodium chloride Assessment/Plan: # influenza B infection: Pt presented with viral prodrome like symptoms and similar symptoms in daughter who is primary caregiver. Infectious workup included UCx NTD, lactic acid wnl (indicating no ischemic colitis) , c. Diff pcr pending, and flu pcr + influenza B negative H1N1 and pt hadn't received flu vaccine this year. ACS r/o with negative trops and unchanged EKGs, TSH wnl. KUB negative for acute obstruction but consistent with previously known ileus.  -zofran prn -advance diet -IVF 75cc /hr - supportive management -PT/OT eval  #Adrenal insuffiencey: in acute illness will increase pt solucortef from home dose to 20mg  qAM and 15 mg qPM for 3 days.   #AKI: in setting of dehydration given decreased po intake. Cr. 1.6>>1.3 this AM.  -cont IVF -trend Bmet  #Severe protein calorie malnutrition Albumin 2.5. Patient is cachetic .Likely multifactorial with component of gluten and lactose intolerance, gut dysmotility, recurrent diarrhea, possible chronic intestinal pseudo-obstruction. Patient's reports decreased po intake  - Nutritional services consulted   #Chronic normocytic anemia  Likely anemia of chronic disease. Has h/o of B12 deficiency which is treated w monthly injections, last level within normal limits. Hgb 11.7 on admission, baseline.  - Hgb 10.8, decrease likely 2/2 to fluid replacement, monitor   Dispo: Disposition is deferred at this time, awaiting improvement of current medical problems.  Anticipated discharge in approximately 1-2 day(s).   The patient does have a current PCP (GARG, ANKIT, MD), therefore will be requiring OPC follow-up after discharge.   The patient does not have transportation limitations that hinder transportation to clinic  appointments.  .Services Needed at time of discharge: Y = Yes, Blank = No PT:   OT:   RN:   Equipment:   Other:     LOS: 1 day   Christen Bame 09/24/2012, 10:34 AM Pgr: [phone]

## 2012-09-25 LAB — BASIC METABOLIC PANEL
CO2: 18 mEq/L — ABNORMAL LOW (ref 19–32)
Chloride: 115 mEq/L — ABNORMAL HIGH (ref 96–112)
Creatinine, Ser: 1.36 mg/dL — ABNORMAL HIGH (ref 0.50–1.10)
Glucose, Bld: 84 mg/dL (ref 70–99)
Potassium: 3.4 mEq/L — ABNORMAL LOW (ref 3.5–5.1)
Sodium: 141 mEq/L (ref 135–145)

## 2012-09-25 MED ORDER — POTASSIUM CHLORIDE CRYS ER 20 MEQ PO TBCR
40.0000 meq | EXTENDED_RELEASE_TABLET | Freq: Once | ORAL | Status: AC
  Administered 2012-09-25: 40 meq via ORAL
  Filled 2012-09-25: qty 2

## 2012-09-25 MED ORDER — MAGNESIUM SULFATE 40 MG/ML IJ SOLN
2.0000 g | Freq: Once | INTRAMUSCULAR | Status: AC
  Administered 2012-09-25: 2 g via INTRAVENOUS
  Filled 2012-09-25: qty 50

## 2012-09-25 NOTE — Discharge Summary (Signed)
Internal Medicine Teaching [facility] Discharge Note  Name: Rachel Bridges MRN: [id number] DOB: 1925/01/09 79 y.o.  Date of Admission: 09/23/2012 10:09 AM Date of Discharge: 09/25/2012 Attending Physician: Burns Spain, MD  Discharge Diagnosis: Principal Problem:   Abdominal pain Active Problems:   VITAMIN B12 DEFICIENCY   DEPRESSION   Adult failure to thrive   Chronic kidney disease   Adrenal insufficiency   Gluten intolerance   Ileus   AKI (acute kidney injury)   Hypokalemia   Hypotension   Discharge Medications:   Medication List    ASK your doctor about these medications       acetaminophen 325 MG tablet  Commonly known as:  TYLENOL  Take 325 mg by mouth once.     aspirin 81 MG tablet  Take 81 mg by mouth daily.     calcium citrate-vitamin D 315-200 MG-UNIT per tablet  Commonly known as:  CITRACAL+D  Take 1 tablet by mouth 2 (two) times daily.     cyanocobalamin 1000 MCG/ML injection  Commonly known as:  (VITAMIN B-12)  Inject 1 mL (1,000 mcg total) into the muscle every 30 (thirty) days.     feeding supplement Liqd  Take 237 mLs by mouth daily. Vanilla.  Hasn't been giving since the sickness started.     furosemide 20 MG tablet  Commonly known as:  LASIX  Take 1 tablet (20 mg total) by mouth daily.     hydrocortisone 10 MG tablet  Commonly known as:  CORTEF  Take 5-10 mg by mouth 2 (two) times daily. 10 mg in the a.m. And 5 mg in the afternoon.     ondansetron 4 MG tablet  Commonly known as:  ZOFRAN  Take 1 tablet (4 mg total) by mouth every 8 (eight) hours as needed for nausea. Nausea.     ondansetron 8 MG disintegrating tablet  Commonly known as:  ZOFRAN ODT  Take 1 tablet (8 mg total) by mouth every 8 (eight) hours as needed for nausea.     potassium chloride 10 MEQ tablet  Commonly known as:  K-DUR  Take 20 mEq by mouth 2 (two) times daily.     sertraline 50 MG tablet  Commonly known as:  ZOLOFT  Take 1 tablet (50 mg total)  by mouth daily.     simethicone 80 MG chewable tablet  Commonly known as:  MYLICON  Chew 160 mg by mouth once.     ZEMPLAR 1 MCG capsule  Generic drug:  paricalcitol  Take 1 mcg by mouth every Monday, Wednesday, and Friday.        Disposition and follow-up:   RachelRachel Bridges was discharged from [facility] in Stable condition.  At the hospital follow up visit please address: 1. Resolution of flu 2. HH services 3. Chronic diarrhea 4. Dietary compliance  Follow-up Appointments:       Future Appointments Provider Department Dept Phone   02/17/2013 1:00 PM [facility]-RAD[facility] [phone]   02/17/2013 2:00 PM Mauri [facility] MEDICAL ONCOLOGY [phone]   02/24/2013 9:00 AM Si Gaul, MD [facility] MEDICAL ONCOLOGY [phone]      Consultations:    Procedures Performed:  Ct Abdomen Pelvis W Contrast  09/02/2012   *RADIOLOGY REPORT*  Clinical Data: 79 year old female with distended abdomen nausea and diarrhea.  Abdominal pain.  CT ABDOMEN AND PELVIS WITH CONTRAST  Technique:  Multidetector CT imaging of the abdomen and  pelvis was performed following the standard protocol during bolus administration of intravenous contrast.  Contrast: OMNIPAQUE IOHEXOL 300 MG/ML  SOLN  Comparison: CT abdomen and pelvis 12/18/2011 and earlier.  Findings: Stable lung bases.  Postoperative changes in the lumbar spine.  Scoliosis. Stable visualized osseous structures. Osteopenia.  Redundant distal colon with sigmoid diverticulosis.  As on prior studies the sigmoid and rectum largely are decompressed.  There may be mild wall thickening of the segments.  The diverticulosis continues into the left colon which is decompressed.  Chronic severe redundancy and gaseous distention of the more proximal colon then is re-identified and has not significantly changed.  Some of the severely dilated bowel is  small bowel.  A as before there is only a small amount of normal caliber small bowel present.  Oral contrast in the stomach and duodenum which are not distended.  The appearance is not significantly changed since 12/18/2011 and earlier exams.  Stable liver.  The gallbladder is not identified.  The portal venous system is patent.  The spleen and pancreas are stable and within normal limits.  Adrenal glands are within normal limits.  Bulky exophytic low-density renal lesions are chronic and have mildly enlarged since 2007.  No left hydronephrosis.  Smaller exophytic right renal lesions likewise are stable.  No right hydronephrosis.  No abdominal free fluid or pneumoperitoneum. Major arterial structures in the abdomen and pelvis appear patent. Largely decompressed bladder.  Chronic prominent mesenteric lymph nodes also are stable over this series of exams.  These measure up to 15 mm individually in short axis as before.  IMPRESSION: 1.  Chronic severe dilated proximal and mid colon as well as distal small bowel.  Appearance not significantly changed over multiple prior exams and suggests a severe chronic dysmotility.  As before, the distal colon is decompressed. 2.  There is diverticulosis of the distal colon, but no convincing evidence of distal colitis/diverticulitis. 3. Stable additional chronic findings in the abdomen and pelvis as above.   Original Report Authenticated By: Erskine Speed, M.D.   Dg Abd Acute W/chest  09/23/2012   *RADIOLOGY REPORT*  Clinical Data: Abdominal pain, diarrhea, past history of colon cancer, smoking  ACUTE ABDOMEN SERIES (ABDOMEN 2 VIEW & CHEST 1 VIEW)  Comparison: 06/01/2012  Findings: Normal heart size and pulmonary vascularity. Tortuous aorta. Lungs emphysematous but clear. No pleural effusion or pneumothorax. Gaseous distention of colon with bowel interposition between liver and diaphragm. No definite bowel wall thickening or free intraperitoneal air. Distended air filled loops of  small bowel are also visualized. Osseous demineralization with prior lumbar fusion. Numerous pelvic phleboliths.  IMPRESSION: Mild gaseous distention of large and small bowel loops in abdomen without discrete focal point of obstruction identified. Findings could represent colonic obstruction or ileus; if further imaging is clinically indicated, consider computed tomography of the abdomen and pelvis with IV and oral contrast.   Original Report Authenticated By: Ulyses Southward, M.D.    Admission HPI: Rachel Bridges is 79 yo Caucasian woman pmh AI followed by Dr. Sharl Ma, Celiac disease s/p colectomy and non-compliant with gluten dietary restrictions, MGUS p/w ongoing abdominal pain, nausea, vomiting, and diarrhea over the past 3 days. Pt states daughter had similar symptoms 1 wk prior. Pt has chronic problems with diarrhea and constipation along with hypotension. Pt has had some intermittent presyncopal episodes but no falls or LOC. Pt is usually semi independent in terms of ADLs and has been ambulating fine with a cane until it was lost around 1  wk ago. Pt denied any CP, SOB, DOE, palpitations, syncope, or fevers. Pt has been compliant with medications but tends to indulge in regular breads and pastas and doesn't seem to understand or know she is on a restricted diet. Her daughter is the one who organizes her medications and her meals. Pt was a former smoker, doesn't drink etoh, or use any other drugs.   Hospital Course by problem list: # influenza B infection: Pt presented with viral prodrome like symptoms and similar symptoms in daughter who is primary caregiver. Infectious workup included UCx NTD, lactic acid wnl (indicating no ischemic colitis) , c. Diff pcr negative and flu pcr + influenza B negative H1N1 and pt hadn't received flu vaccine this year. ACS r/o with negative trops and unchanged EKGs, TSH wnl. KUB negative for acute obstruction but consistent with previously known ileus. Patient still has dry cough and  wheezing which is most likely from her flu. Tamiflu and zofran prn along with supportive management with robitussin for cough and prn Albuterol for wheezing. Pt was evaluated by PT/OT and recommended SNF but pt and daughter refused and requested HH.  #Adrenal insuffiencey: in acute illness will increase pt solucortef from home dose to 20mg  qAM and 10 mg qPM for 3 days (started on 5/29- 6/1). Pt d/c on home dose of cortef: 10 mg in AM and 5 mg in PM   # Hypokalemia: In the setting of diarrhea this was able to be replaced. Would recheck as outp.    # AKI-resolved: in setting of dehydration given decreased po intake and diarrhea. Cr 1.6>>1.35-->1.43>>1.18 upon d/c.   #Severe protein calorie malnutrition Albumin 2.5. Patient is cachetic .Likely multifactorial with component of gluten and lactose intolerance, gut dysmotility, recurrent diarrhea. C diff pcr negative. Extensive education regarding Gluten free diet, started loperamide.  #: Gluten intolerrance: according to record in Epic, Dr. Loreta Ave made dx. August 2011 endoscopy with biopsy was negative for celiac disease. Prior to this patient had elevated TTG IgG of 93 (normal <20). Dr Loreta Ave made clinical diagnosis of gluten intolerance based on diarrhea/symptoms from eating gluten.   #Chronic normocytic anemia: Likely anemia of chronic disease. Has h/o of B12 deficiency which is treated w monthly injections, last level within normal limits. Hgb 11.7 on admission, baseline.   Discharge Vitals:  BP 109/64  Pulse 59  Temp(Src) 98.8 F (37.1 C) (Oral)  Resp 17  Ht 5\' 7"  (1.702 m)  Wt 121 lb 0.5 oz (54.9 kg)  BMI 18.95 kg/m2  SpO2 96% General: elderly, pleasant woman resting in bed, NAD  HEENT: Pharynx Pink and moist, no erythema, and no exudates.  Cardiac: Regular Rate and Rhythm, no rubs, murmurs or gallops. No JVD appreciated  Pulm: Has mild wheezing bilaterally. No rales or rhonchi  Abd: abdomen soft, nontender, nondistended, with normoactive BS   Ext: No edema, with poor skin turgor. Has ecchymosis over L knee, no joint effusions, pain or decreased ROM.  Neuro: alert and oriented to person and place, not to date. Stable. Cranial nerves II-XII grossly intact, strength and sensation to light touch equal in bilateral upper and lower extremities. Normally interactive, good eye contact, not anxious appearing, and not depressed appearing.  Discharge Labs:  Results for orders placed during the hospital encounter of 09/23/12 (from the past 24 hour(s))  CLOSTRIDIUM DIFFICILE BY PCR     Status: None   Collection Time    09/25/12  6:44 AM      Result Value Range   C  difficile by pcr NEGATIVE  NEGATIVE  BASIC METABOLIC PANEL     Status: Abnormal   Collection Time    09/25/12  8:05 AM      Result Value Range   Sodium 141  135 - 145 mEq/L   Potassium 3.4 (*) 3.5 - 5.1 mEq/L   Chloride 115 (*) 96 - 112 mEq/L   CO2 18 (*) 19 - 32 mEq/L   Glucose, Bld 84  70 - 99 mg/dL   BUN 15  6 - 23 mg/dL   Creatinine, Ser 8.11 (*) 0.50 - 1.10 mg/dL   Calcium 7.3 (*) 8.4 - 10.5 mg/dL   GFR calc non Af Amer 34 (*) >90 mL/min   GFR calc Af Amer 40 (*) >90 mL/min    Signed: Christen Bame 09/25/2012, 12:53 PM   Time Spent on Discharge: 40 min Services Ordered on Discharge: [facility] PT/OT/Health Aide Equipment Ordered on Discharge: none

## 2012-09-25 NOTE — H&P (Signed)
Resident Addendum to Medical Student Note   I have seen and examined the patient, and agree with the the medical student assessment and plan outlined above. Please see my brief note below for additional details.  Length of Stay: 2   Christen Bame 09/25/2012, 7:09 AM Pgr: [phone]

## 2012-09-25 NOTE — Evaluation (Signed)
Occupational Therapy Evaluation Patient Details Name: Rachel Bridges MRN: [id number] DOB: 1924-12-11 Today's Date: 09/25/2012 Time: 4098-1191 OT Time Calculation (min): 24 min  OT Assessment / Plan / Recommendation Clinical Impression  This 79 yo female admitted nausea/vomiting presents to acute OT with problems below. Will benefit from acute OT with follow up OT at SNF.    OT Assessment  Patient needs continued OT Services    Follow Up Recommendations  SNF    Barriers to Discharge None    Equipment Recommendations  None recommended by OT       Frequency  Min 2X/week    Precautions / Restrictions Precautions Precautions: Fall Precaution Comments: Contact Droplet Restrictions Weight Bearing Restrictions: No       ADL  Eating/Feeding: Simulated;Independent Where Assessed - Eating/Feeding: Chair Grooming: Simulated;Set up;Supervision/safety Where Assessed - Grooming: Supported sitting Upper Body Bathing: Simulated;Set up;Supervision/safety Where Assessed - Upper Body Bathing: Supported sitting Lower Body Bathing: Simulated;Minimal assistance Where Assessed - Lower Body Bathing: Supported sit to stand Upper Body Dressing: Simulated;Minimal assistance Where Assessed - Upper Body Dressing: Supported sitting Lower Body Dressing: Simulated;Moderate assistance Where Assessed - Lower Body Dressing: Supported sit to Pharmacist, hospital: Performed;Minimal Dentist Method: Surveyor, minerals: Materials engineer and Hygiene: Performed;+1 Total assistance (back peri care) Where Assessed - Engineer, mining and Hygiene: Standing Equipment Used: Gait belt Transfers/Ambulation Related to ADLs: min A    OT Diagnosis: Generalized weakness;Cognitive deficits  OT Problem List: Decreased strength;Decreased activity tolerance;Decreased safety awareness;Decreased knowledge of use of DME or AE OT  Treatment Interventions: Self-care/ADL training;Balance training;Patient/family education;DME and/or AE instruction;Therapeutic activities   OT Goals Acute Rehab OT Goals OT Goal Formulation: With patient Time For Goal Achievement: 10/09/12 Potential to Achieve Goals: Good ADL Goals Pt Will Perform Grooming: Standing at sink;Unsupported (min gurad A, 1 task) ADL Goal: Grooming - Progress: Goal set today Pt Will Perform Upper Body Bathing: with set-up;with supervision;Unsupported;Sit to stand from bed ADL Goal: Upper Body Bathing - Progress: Goal set today Pt Will Perform Lower Body Bathing: with min assist;Sit to stand from bed;Unsupported ADL Goal: Lower Body Bathing - Progress: Progressing toward goals Pt Will Transfer to Toilet: Ambulation;with DME;Regular height toilet;Comfort height toilet;3-in-1;Grab bars (min guard A) ADL Goal: Toilet Transfer - Progress: Goal set today Pt Will Perform Toileting - Clothing Manipulation: with min assist;Standing ADL Goal: Toileting - Clothing Manipulation - Progress: Goal set today Pt Will Perform Toileting - Hygiene: with mod assist;Sit to stand from 3-in-1/toilet ADL Goal: Toileting - Hygiene - Progress: Goal set today Miscellaneous OT Goals Miscellaneous OT Goal #1: Pt will be able to get OOB with Min A for BADLs and transfers OT Goal: Miscellaneous Goal #1 - Progress: Goal set today  Visit Information  Last OT Received On: 09/25/12 Assistance Needed: +1    Subjective Data  Subjective: "I'm not going to no nursing home"   Prior Functioning     Home Living Lives With:  (sister) Available Help at Discharge: Family;Available 24 hours/day Type of Home: House Home Access: Stairs to enter [employer] of Steps: 3 Entrance Stairs-Rails: Right Home Layout: One level Bathroom Shower/Tub: Forensic scientist: Standard Home Adaptive Equipment: Walker - rolling;Straight cane;Shower chair with back;Bedside  commode/3-in-1;Hand-held shower hose Prior Function Level of Independence: Independent with assistive device(s) Able to Take Stairs?: Yes Driving: No Vocation: Retired Musician: No difficulties Dominant Hand: Right            Cognition  Cognition Arousal/Alertness: Awake/alert Behavior During Therapy: WFL for tasks assessed/performed Overall Cognitive Status: History of cognitive impairments - at baseline    Extremity/Trunk Assessment Right Upper Extremity Assessment RUE ROM/Strength/Tone: Within functional levels Left Upper Extremity Assessment LUE ROM/Strength/Tone: Within functional levels     Mobility Bed Mobility Bed Mobility: Supine to Sit;Sitting - Scoot to Edge of Bed Supine to Sit: 3: Mod assist;With rails;HOB elevated Sitting - Scoot to Edge of Bed: 4: Min assist;With rail Transfers Transfers: Sit to Stand;Stand to Sit Sit to Stand: 4: Min assist;With upper extremity assist;From bed Stand to Sit: 4: Min assist;With upper extremity assist;With armrests;To chair/3-in-1           End of Session OT - End of Session Equipment Utilized During Treatment: Gait belt Activity Tolerance: Patient limited by fatigue Patient left: in chair;with call bell/phone within reach;with chair alarm set       Evette Georges  [phone] 09/25/2012, 11:30 AM

## 2012-09-25 NOTE — Progress Notes (Signed)
  Date: 09/25/2012  Patient name: Rachel Bridges  Medical record number: [id number]  Date of birth: 08/17/1924   This patient has been seen and the plan of care was discussed with the house staff. Please see their note for complete details. I concur with their findings with the following additions/corrections: Ms Wubben was sitting in chair. She had no complaints. Was eating graham crackers. Tolerating regular diet. Her admission sxs of hypotension, N/V, anorexia have all resolved. Her flu sxs will slowly improve. Will need to discuss with daughter about dispo plans.   Burns Spain, MD 09/25/2012, 11:23 AM

## 2012-09-25 NOTE — Progress Notes (Signed)
Subjective: Pt is doing very well this AM. Tolerating advanced diet. No complaints this AM. Some urinary incontinence with skin breakdown and therefore required foley insertion. Will need to touch base with daughter in terms of placement as PT recommended SNF and pt is adamantly refusing.    Objective: Vital signs in last 24 hours: Filed Vitals:   09/24/12 1355 09/24/12 1715 09/24/12 2146 09/25/12 0404  BP: 91/52 126/68 120/60 128/66  Pulse: 66 65 53 61  Temp: 98.6 F (37 C) 98.4 F (36.9 C) 98.4 F (36.9 C) 97.8 F (36.6 C)  TempSrc: Oral Oral Oral Oral  Resp: 18 18 20 22   Height:      Weight:   121 lb 0.5 oz (54.9 kg)   SpO2: 98% 98% 97% 98%   Weight change: -6 lb 15.5 oz (-3.16 kg)  Intake/Output Summary (Last 24 hours) at 09/25/12 0721 Last data filed at 09/25/12 0407  Gross per 24 hour  Intake 2668.75 ml  Output    250 ml  Net 2418.75 ml   General: elderly, pleasant woman resting in bed, NAD  HEENT: Pharynx Pink and moist, no erythema, and no exudates.  Cardiac: Regular Rate and Rhythm, no rubs, murmurs or gallops. No JVD appreciated  Pulm: No increased WOB. Clear to auscultation bilaterally, no wheezes, rales, or rhonchi  Abd: abdomen soft, nontender, nondistended, with normoactive BS  Ext: No edema, with poor skin turgor. Has ecchymosis over L knee, no joint effusions, pain or decreased ROM.  Neuro: alert and oriented to person and place, not to date. Stable. Cranial nerves II-XII grossly intact, strength and sensation to light touch equal in bilateral upper and lower extremities. Normally interactive, good eye contact, not anxious appearing, and not depressed appearing.  Lab Results: Basic Metabolic Panel:  Recent Labs Lab 09/23/12 1027 09/24/12 0540  NA 142 144  K 3.0* 3.4*  CL 108 117*  CO2 19 18*  GLUCOSE 121* 76  BUN 27* 21  CREATININE 1.65* 1.39*  CALCIUM 8.0* 7.6*  MG  --  1.4*   Liver Function Tests:  Recent Labs Lab 09/23/12 1027  AST 18   ALT 11  ALKPHOS 95  BILITOT 0.2*  PROT 6.3  ALBUMIN 2.5*    Recent Labs Lab 09/23/12 1027  LIPASE 30  AMYLASE 23   CBC:  Recent Labs Lab 09/23/12 1027 09/24/12 0540  WBC 6.2 3.1*  NEUTROABS 4.3  --   HGB 11.7* 10.8*  HCT 36.2 33.0*  MCV 85.8 86.8  PLT 147* 100*   Thyroid Function Tests:  Recent Labs Lab 09/23/12 2043  TSH 4.199   Urinalysis:  Recent Labs Lab 09/23/12 1210  COLORURINE YELLOW  LABSPEC 1.017  PHURINE 5.5  GLUCOSEU NEGATIVE  HGBUR NEGATIVE  BILIRUBINUR SMALL*  KETONESUR NEGATIVE  PROTEINUR 30*  UROBILINOGEN 0.2  NITRITE NEGATIVE  LEUKOCYTESUR NEGATIVE   Micro Results: Recent Results (from the past 240 hour(s))  URINE CULTURE     Status: None   Collection Time    09/23/12 12:10 PM      Result Value Range Status   Specimen Description URINE, CATHETERIZED   Final   Special Requests NONE   Final   Culture  Setup Time 09/23/2012 13:12   Final   Colony Count NO GROWTH   Final   Culture NO GROWTH   Final   Report Status 09/24/2012 FINAL   Final   Studies/Results: Dg Abd Acute W/chest  09/23/2012   *RADIOLOGY REPORT*  Clinical Data: Abdominal pain, diarrhea,  past history of colon cancer, smoking  ACUTE ABDOMEN SERIES (ABDOMEN 2 VIEW & CHEST 1 VIEW)  Comparison: 06/01/2012  Findings: Normal heart size and pulmonary vascularity. Tortuous aorta. Lungs emphysematous but clear. No pleural effusion or pneumothorax. Gaseous distention of colon with bowel interposition between liver and diaphragm. No definite bowel wall thickening or free intraperitoneal air. Distended air filled loops of small bowel are also visualized. Osseous demineralization with prior lumbar fusion. Numerous pelvic phleboliths.  IMPRESSION: Mild gaseous distention of large and small bowel loops in abdomen without discrete focal point of obstruction identified. Findings could represent colonic obstruction or ileus; if further imaging is clinically indicated, consider computed  tomography of the abdomen and pelvis with IV and oral contrast.   Original Report Authenticated By: Ulyses Southward, M.D.   Medications: I have reviewed the patient's current medications. Scheduled Meds: . aspirin EC  81 mg Oral Daily  . enoxaparin (LOVENOX) injection  30 mg Subcutaneous Q24H  . feeding supplement  1 Container Oral BID BM  . hydrocortisone  10 mg Oral Q1500  . hydrocortisone  20 mg Oral Daily  . oseltamivir  75 mg Oral Daily  . sertraline  50 mg Oral Daily  . sodium chloride  3 mL Intravenous Q12H  . sodium chloride  3 mL Intravenous Q12H   Continuous Infusions: . sodium chloride 75 mL/hr (09/24/12 1146)   PRN Meds:.sodium chloride, acetaminophen, acetaminophen, ondansetron (ZOFRAN) IV, ondansetron, sodium chloride Assessment/Plan: # influenza B infection: Pt presented with viral prodrome like symptoms and similar symptoms in daughter who is primary caregiver. Infectious workup included UCx NTD, lactic acid wnl (indicating no ischemic colitis) , c. Diff pcr pending, and flu pcr + influenza B negative H1N1 and pt hadn't received flu vaccine this year. ACS r/o with negative trops and unchanged EKGs, TSH wnl. KUB negative for acute obstruction but consistent with previously known ileus.  -zofran prn -advance diet -IVF TKO - supportive management -touch base with daughter in terms of placement/HH services  #Adrenal insuffiencey: in acute illness will increase pt solucortef from home dose to 20mg  qAM and 15 mg qPM for 3 days.   # Hypokalemia in setting of colectomy and chronic diarrhea: K 3.4 this AM likely in setting of frequent diarrhea over course of 3-4 days and chronically.  -replace Kdur -check Mg and replace  # AKI-resolving: in setting of dehydration given decreased po intake. Cr. 1.6>>1.3 this AM.  -cont IVF -trend Bmet  #Severe protein calorie malnutrition Albumin 2.5. Patient is cachetic .Likely multifactorial with component of gluten and lactose intolerance, gut  dysmotility, recurrent diarrhea, possible chronic intestinal pseudo-obstruction. Patient's reports decreased po intake  - Nutritional services consulted   #Chronic normocytic anemia  Likely anemia of chronic disease. Has h/o of B12 deficiency which is treated w monthly injections, last level within normal limits. Hgb 11.7 on admission, baseline.  - Hgb 10.8, decrease likely 2/2 to fluid replacement, monitor   Dispo: Disposition is deferred at this time, awaiting improvement of current medical problems.  Anticipated discharge in approximately 1-2 day(s).   The patient does have a current PCP (GARG, ANKIT, MD), therefore will be requiring OPC follow-up after discharge.   The patient does not have transportation limitations that hinder transportation to clinic appointments.  .Services Needed at time of discharge: Y = Yes, Blank = No PT:   OT:   RN:   Equipment:   Other:     LOS: 2 days   Christen Bame 09/25/2012, 7:21 AM Pgr: [phone]

## 2012-09-25 NOTE — Progress Notes (Signed)
Resident Addendum to Medical Student Note   I have seen and examined the patient, and agree with the the medical student assessment and plan outlined above. Please see my brief note below for additional details.  Rachel Bridges 09/25/2012, 7:09 AM  Pgr: [phone]

## 2012-09-26 LAB — BASIC METABOLIC PANEL
BUN: 13 mg/dL (ref 6–23)
CO2: 17 mEq/L — ABNORMAL LOW (ref 19–32)
Chloride: 120 mEq/L — ABNORMAL HIGH (ref 96–112)
Creatinine, Ser: 1.35 mg/dL — ABNORMAL HIGH (ref 0.50–1.10)
GFR calc non Af Amer: 35 mL/min — ABNORMAL LOW (ref >90)
Glucose, Bld: 83 mg/dL (ref 70–99)
Potassium: 3.8 mEq/L (ref 3.5–5.1)
Sodium: 145 mEq/L (ref 135–145)

## 2012-09-26 NOTE — Progress Notes (Signed)
Subjective:  - Pt is doing well this AM. Tolerating liquid diet. Some urinary incontinence with skin breakdown and therefore required foley insertion.   - has mild cough, no chest pain.  - PT/OT recommended SNF placement, but patient strongly refused it due to a bad experience in the past. I spoke with one of her daughters, Ms. Sloyd on the phone. Her daughter would like patient be discharged home this time with Home health RN for help. Her daughter was living with pt 24 hours a day before admission and can still to do so if pt is discharged home. Her daughter needs to discuss with her brothers about pt's care before patient is discharged, possibley discharge on next Monday.   Objective: Vital signs in last 24 hours: Filed Vitals:   09/25/12 2054 09/26/12 0430 09/26/12 0941 09/26/12 1318  BP: 115/69 112/57 98/53 111/71  Pulse: 60 99 52 55  Temp: 97.9 F (36.6 C) 97.9 F (36.6 C) 97.8 F (36.6 C) 97.6 F (36.4 C)  TempSrc: Oral Oral    Resp: 20 20 18 18   Height: 5\' 7"  (1.702 m)     Weight: 121 lb 8 oz (55.112 kg)     SpO2: 92% 94% 97% 97%   Weight change: 7.5 oz (0.212 kg)  Intake/Output Summary (Last 24 hours) at 09/26/12 1529 Last data filed at 09/26/12 1319  Gross per 24 hour  Intake    590 ml  Output    450 ml  Net    140 ml   General: elderly, pleasant woman resting in bed, NAD  HEENT: Pharynx Pink and moist, no erythema, and no exudates.  Cardiac: Regular Rate and Rhythm, no rubs, murmurs or gallops. No JVD appreciated  Pulm: No increased WOB. Clear to auscultation bilaterally, no wheezes, rales, or rhonchi  Abd: abdomen soft, nontender, nondistended, with normoactive BS  Ext: No edema, with poor skin turgor. Has ecchymosis over L knee, no joint effusions, pain or decreased ROM.  Neuro: alert and oriented to person and place, not to date. Stable. Cranial nerves II-XII grossly intact, strength and sensation to light touch equal in bilateral upper and lower extremities.  Normally interactive, good eye contact, not anxious appearing, and not depressed appearing.  Lab Results: Basic Metabolic Panel:  Recent Labs Lab 09/24/12 0540 09/25/12 0805 09/26/12 0605  NA 144 141 145  K 3.4* 3.4* 3.8  CL 117* 115* 120*  CO2 18* 18* 17*  GLUCOSE 76 84 83  BUN 21 15 13   CREATININE 1.39* 1.36* 1.35*  CALCIUM 7.6* 7.3* 7.7*  MG 1.4*  --  2.1   Liver Function Tests:  Recent Labs Lab 09/23/12 1027  AST 18  ALT 11  ALKPHOS 95  BILITOT 0.2*  PROT 6.3  ALBUMIN 2.5*    Recent Labs Lab 09/23/12 1027  LIPASE 30  AMYLASE 23   CBC:  Recent Labs Lab 09/23/12 1027 09/24/12 0540  WBC 6.2 3.1*  NEUTROABS 4.3  --   HGB 11.7* 10.8*  HCT 36.2 33.0*  MCV 85.8 86.8  PLT 147* 100*   Thyroid Function Tests:  Recent Labs Lab 09/23/12 2043  TSH 4.199   Urinalysis:  Recent Labs Lab 09/23/12 1210  COLORURINE YELLOW  LABSPEC 1.017  PHURINE 5.5  GLUCOSEU NEGATIVE  HGBUR NEGATIVE  BILIRUBINUR SMALL*  KETONESUR NEGATIVE  PROTEINUR 30*  UROBILINOGEN 0.2  NITRITE NEGATIVE  LEUKOCYTESUR NEGATIVE   Micro Results: Recent Results (from the past 240 hour(s))  URINE CULTURE     Status:  None   Collection Time    09/23/12 12:10 PM      Result Value Range Status   Specimen Description URINE, CATHETERIZED   Final   Special Requests NONE   Final   Culture  Setup Time 09/23/2012 13:12   Final   Colony Count NO GROWTH   Final   Culture NO GROWTH   Final   Report Status 09/24/2012 FINAL   Final  CLOSTRIDIUM DIFFICILE BY PCR     Status: None   Collection Time    09/25/12  6:44 AM      Result Value Range Status   C difficile by pcr NEGATIVE  NEGATIVE Final   Studies/Results: No results found. Medications: I have reviewed the patient's current medications. Scheduled Meds: . aspirin EC  81 mg Oral Daily  . enoxaparin (LOVENOX) injection  30 mg Subcutaneous Q24H  . feeding supplement  1 Container Oral BID BM  . hydrocortisone  10 mg Oral Q1500  .  hydrocortisone  20 mg Oral Daily  . oseltamivir  75 mg Oral Daily  . sertraline  50 mg Oral Daily  . sodium chloride  3 mL Intravenous Q12H  . sodium chloride  3 mL Intravenous Q12H   Continuous Infusions: . sodium chloride 10 mL/hr (09/25/12 1010)   PRN Meds:.sodium chloride, acetaminophen, acetaminophen, ondansetron (ZOFRAN) IV, ondansetron, sodium chloride Assessment/Plan: # influenza B infection: Pt presented with viral prodrome like symptoms and similar symptoms in daughter who is primary caregiver. Infectious workup included UCx NTD, lactic acid wnl (indicating no ischemic colitis) , c. Diff pcr negative and flu pcr + influenza B negative H1N1 and pt hadn't received flu vaccine this year. ACS r/o with negative trops and unchanged EKGs, TSH wnl. KUB negative for acute obstruction but consistent with previously known ileus.   - Tamiflu and zofran prn - advance to regular diet - upportive management -touch base with daughter in terms of placement/HH services  #Adrenal insuffiencey: in acute illness will increase pt solucortef from home dose to 20mg  qAM and 10 mg qPM for 3 days (started on 5/29). Will go back to home dose on 6/1.  # Hypokalemia: resolved. K=3.8  # AKI-resolving: in setting of dehydration given decreased po intake. Cr. 1.6>>1.35 this AM.   -trend Bmet  #Severe protein calorie malnutrition Albumin 2.5. Patient is cachetic .Likely multifactorial with component of gluten and lactose intolerance, gut dysmotility, recurrent diarrhea, possible chronic intestinal pseudo-obstruction. Patient's reports decreased po intake  - Nutritional services consulted   #Chronic normocytic anemia  Likely anemia of chronic disease. Has h/o of B12 deficiency which is treated w monthly injections, last level within normal limits. Hgb 11.7 on admission, baseline.  - Hgb 10.8, decrease likely 2/2 to fluid replacement, monitor   Dispo: Disposition is deferred at this time, awaiting improvement  of current medical problems  Anticipated discharge home on 6/2.   PT/OT recommended SNF placement, but patient strongly refused it due to a bad experience in the past. I spoke with one of her daughters, Ms. Sloyd on the phone. Her daughter would like patient be discharged home this time with Home health RN for help. Her daughter was living with pt 24 hours a day before admission and can still to do so if pt is discharged home. Her daughter needs to discuss with her brothers about pt's care before patient is discharged, possibley discharge on next Monday.  The patient does have a current PCP (GARG, ANKIT, MD), therefore will be requiring OPC follow-up  after discharge.   The patient does not have transportation limitations that hinder transportation to clinic appointments.  .Services Needed at time of discharge: Y = Yes, Blank = No PT:   OT:   RN:   Equipment:   Other:     LOS: 3 days   Lorretta Harp 09/26/2012, 3:29 PM Pgr: [phone]

## 2012-09-27 LAB — BASIC METABOLIC PANEL
BUN: 15 mg/dL (ref 6–23)
Chloride: 116 mEq/L — ABNORMAL HIGH (ref 96–112)
Creatinine, Ser: 1.43 mg/dL — ABNORMAL HIGH (ref 0.50–1.10)
GFR calc Af Amer: 38 mL/min — ABNORMAL LOW (ref >90)
GFR calc non Af Amer: 32 mL/min — ABNORMAL LOW (ref >90)
Potassium: 3.2 mEq/L — ABNORMAL LOW (ref 3.5–5.1)
Sodium: 142 mEq/L (ref 135–145)

## 2012-09-27 MED ORDER — POTASSIUM CHLORIDE CRYS ER 20 MEQ PO TBCR
40.0000 meq | EXTENDED_RELEASE_TABLET | ORAL | Status: AC
  Administered 2012-09-27 (×2): 40 meq via ORAL
  Filled 2012-09-27 (×2): qty 2

## 2012-09-27 MED ORDER — HYDROCORTISONE 10 MG PO TABS
10.0000 mg | ORAL_TABLET | Freq: Every day | ORAL | Status: DC
  Administered 2012-09-27 – 2012-09-28 (×2): 10 mg via ORAL
  Filled 2012-09-27 (×2): qty 1

## 2012-09-27 MED ORDER — GUAIFENESIN 100 MG/5ML PO SYRP
100.0000 mg | ORAL_SOLUTION | ORAL | Status: DC | PRN
  Administered 2012-09-27 (×2): 100 mg via ORAL
  Filled 2012-09-27 (×3): qty 5

## 2012-09-27 MED ORDER — ALBUTEROL SULFATE HFA 108 (90 BASE) MCG/ACT IN AERS
2.0000 | INHALATION_SPRAY | Freq: Four times a day (QID) | RESPIRATORY_TRACT | Status: DC | PRN
  Administered 2012-09-27: 2 via RESPIRATORY_TRACT
  Filled 2012-09-27: qty 6.7

## 2012-09-27 MED ORDER — HYDROCORTISONE 5 MG PO TABS
5.0000 mg | ORAL_TABLET | Freq: Every day | ORAL | Status: DC
  Administered 2012-09-27 – 2012-09-28 (×2): 5 mg via ORAL
  Filled 2012-09-27 (×2): qty 1

## 2012-09-27 MED ORDER — SODIUM CHLORIDE 0.9 % IV SOLN
INTRAVENOUS | Status: AC
  Administered 2012-09-27: 12:00:00 via INTRAVENOUS

## 2012-09-27 MED ORDER — LOPERAMIDE HCL 2 MG PO CAPS
2.0000 mg | ORAL_CAPSULE | ORAL | Status: DC | PRN
  Administered 2012-09-27: 2 mg via ORAL
  Filled 2012-09-27 (×2): qty 1

## 2012-09-27 NOTE — Progress Notes (Signed)
Subjective:  - Pt feels good.   - Had 2 BW with loose stools. No abdominal pain.  - Has dry cough, no chest pain, No fever or chills.  - K=3.2; Cre 1.35-->1.43 (GFR 32)  Objective: Vital signs in last 24 hours: Filed Vitals:   09/26/12 1318 09/26/12 1715 09/26/12 2112 09/27/12 0340  BP: 111/71 117/75 117/70 126/72  Pulse: 55 55 61 54  Temp: 97.6 F (36.4 C) 97.5 F (36.4 C) 98.1 F (36.7 C) 97.5 F (36.4 C)  TempSrc:   Oral Oral  Resp: 18 17 18 18   Height:   5\' 7"  (1.702 m)   Weight:   121 lb 7.6 oz (55.1 kg)   SpO2: 97% 96% 95% 94%   Weight change: -0.4 oz (-0.012 kg)  Intake/Output Summary (Last 24 hours) at 09/27/12 0911 Last data filed at 09/26/12 2300  Gross per 24 hour  Intake    690 ml  Output    100 ml  Net    590 ml   General: elderly, pleasant woman resting in bed, NAD  HEENT: Pharynx Pink and moist, no erythema, and no exudates.  Cardiac: Regular Rate and Rhythm, no rubs, murmurs or gallops. No JVD appreciated  Pulm: Has mild wheezing bilaterally. No rales or rhonchi  Abd: abdomen soft, nontender, nondistended, with normoactive BS  Ext: No edema, with poor skin turgor. Has ecchymosis over L knee, no joint effusions, pain or decreased ROM.  Neuro: alert and oriented to person and place, not to date. Stable. Cranial nerves II-XII grossly intact, strength and sensation to light touch equal in bilateral upper and lower extremities. Normally interactive, good eye contact, not anxious appearing, and not depressed appearing.  Lab Results: Basic Metabolic Panel:  Recent Labs Lab 09/24/12 0540  09/26/12 0605 09/27/12 0452  NA 144  < > 145 142  K 3.4*  < > 3.8 3.2*  CL 117*  < > 120* 116*  CO2 18*  < > 17* 19  GLUCOSE 76  < > 83 110*  BUN 21  < > 13 15  CREATININE 1.39*  < > 1.35* 1.43*  CALCIUM 7.6*  < > 7.7* 7.4*  MG 1.4*  --  2.1  --   < > = values in this interval not displayed. Liver Function Tests:  Recent Labs Lab 09/23/12 1027  AST 18  ALT  11  ALKPHOS 95  BILITOT 0.2*  PROT 6.3  ALBUMIN 2.5*    Recent Labs Lab 09/23/12 1027  LIPASE 30  AMYLASE 23   CBC:  Recent Labs Lab 09/23/12 1027 09/24/12 0540  WBC 6.2 3.1*  NEUTROABS 4.3  --   HGB 11.7* 10.8*  HCT 36.2 33.0*  MCV 85.8 86.8  PLT 147* 100*   Thyroid Function Tests:  Recent Labs Lab 09/23/12 2043  TSH 4.199   Urinalysis:  Recent Labs Lab 09/23/12 1210  COLORURINE YELLOW  LABSPEC 1.017  PHURINE 5.5  GLUCOSEU NEGATIVE  HGBUR NEGATIVE  BILIRUBINUR SMALL*  KETONESUR NEGATIVE  PROTEINUR 30*  UROBILINOGEN 0.2  NITRITE NEGATIVE  LEUKOCYTESUR NEGATIVE   Micro Results: Recent Results (from the past 240 hour(s))  URINE CULTURE     Status: None   Collection Time    09/23/12 12:10 PM      Result Value Range Status   Specimen Description URINE, CATHETERIZED   Final   Special Requests NONE   Final   Culture  Setup Time 09/23/2012 13:12   Final   Colony Count NO GROWTH  Final   Culture NO GROWTH   Final   Report Status 09/24/2012 FINAL   Final  CLOSTRIDIUM DIFFICILE BY PCR     Status: None   Collection Time    09/25/12  6:44 AM      Result Value Range Status   C difficile by pcr NEGATIVE  NEGATIVE Final   Studies/Results: No results found. Medications: I have reviewed the patient's current medications. Scheduled Meds: . aspirin EC  81 mg Oral Daily  . enoxaparin (LOVENOX) injection  30 mg Subcutaneous Q24H  . feeding supplement  1 Container Oral BID BM  . hydrocortisone  10 mg Oral Daily  . hydrocortisone  5 mg Oral Q1500  . oseltamivir  75 mg Oral Daily  . potassium chloride  40 mEq Oral Q4H  . sertraline  50 mg Oral Daily  . sodium chloride  3 mL Intravenous Q12H  . sodium chloride  3 mL Intravenous Q12H   Continuous Infusions: . sodium chloride 10 mL/hr (09/25/12 1010)  . sodium chloride     PRN Meds:.sodium chloride, acetaminophen, acetaminophen, albuterol, guaifenesin, loperamide, ondansetron (ZOFRAN) IV, ondansetron,  sodium chloride Assessment/Plan:  # influenza B infection: Pt presented with viral prodrome like symptoms and similar symptoms in daughter who is primary caregiver. Infectious workup included UCx NTD, lactic acid wnl (indicating no ischemic colitis) , c. Diff pcr negative and flu pcr + influenza B negative H1N1 and pt hadn't received flu vaccine this year. ACS r/o with negative trops and unchanged EKGs, TSH wnl. KUB negative for acute obstruction but consistent with previously known ileus. Patient still has dry cough and wheezing which is most likely from her flu..  - Tamiflu and zofran prn - supportive management - add robitussin for cough and prn Albuterol for wheezing  #Adrenal insuffiencey: in acute illness will increase pt solucortef from home dose to 20mg  qAM and 10 mg qPM for 3 days (started on 5/29).   -Will go back to home dose of cortef: 10 mg in AM and 5 mg in PM   # Hypokalemia: K=3.2  - oral KCl 40 mEq X 2 doses - f/u BMP and Mg   # AKI-resolving: in setting of dehydration given decreased po intake and diarrhea. Cr. 1.6>>1.35-->1.43 (GFR 32). -  IV NS 100 ml/h X 12 hrs -trend Bmet  #Severe protein calorie malnutrition Albumin 2.5. Patient is cachetic .Likely multifactorial with component of gluten and lactose intolerance, gut dysmotility, recurrent diarrhea. C diff pcr negative  - start Gluten free diet - start  loperamide - Nutritional services consulted   #:  Gluten intolerrance: according to record in Epic,  Dr. Loreta Ave made dx. August 2011 endoscopy with biopsy was negative for celiac disease. Prior to this patient had elevated TTG IgG of 93 (normal <20). Dr Loreta Ave made clinical diagnosis of gluten intolerance based on diarrhea/symptoms from eating gluten.   - Gluten free diet  #Chronic normocytic anemia  Likely anemia of chronic disease. Has h/o of B12 deficiency which is treated w monthly injections, last level within normal limits. Hgb 11.7 on admission, baseline.  -  Hgb 10.8, decrease likely 2/2 to fluid replacement, monitor     Dispo: Disposition is deferred at this time, awaiting improvement of current medical problems  Anticipated discharge home on 6/2.   PT/OT recommended SNF placement, but patient strongly refused it due to a bad experience in the past. I spoke with one of her daughters, Ms. Sloyd on the phone. Her daughter would like patient be  discharged home this time with Home health RN for help. Her daughter was living with pt 24 hours a day before admission and can still to do so if pt is discharged home. Her daughter needs to discuss with her brothers about pt's care before patient is discharged, possibley discharge on next Monday.  The patient does have a current PCP (GARG, ANKIT, MD), therefore will be requiring OPC follow-up after discharge.   The patient does not have transportation limitations that hinder transportation to clinic appointments.  .Services Needed at time of discharge: Y = Yes, Blank = No PT:   OT:   RN:   Equipment:   Other:     LOS: 4 days   Lorretta Harp 09/27/2012, 9:11 AM Pgr: [phone]

## 2012-09-28 LAB — BASIC METABOLIC PANEL
CO2: 20 mEq/L (ref 19–32)
Calcium: 7.6 mg/dL — ABNORMAL LOW (ref 8.4–10.5)
Creatinine, Ser: 1.18 mg/dL — ABNORMAL HIGH (ref 0.50–1.10)
GFR calc Af Amer: 47 mL/min — ABNORMAL LOW (ref >90)
Potassium: 4.1 mEq/L (ref 3.5–5.1)
Sodium: 144 mEq/L (ref 135–145)

## 2012-09-28 LAB — MAGNESIUM: Magnesium: 1.8 mg/dL (ref 1.5–2.5)

## 2012-09-28 MED ORDER — GUAIFENESIN 100 MG/5ML PO SYRP: 100.0000 mg | ORAL_SOLUTION | ORAL | Status: DC | PRN

## 2012-09-28 NOTE — Progress Notes (Signed)
Pt discharged to home after visit summary reviewed with pt's daughter. Pt remains stable. No signs and symptoms of distress. Educated to return to ER in the event of SOB, dizziness, chest pain, or fainting. Laverda Sorenson, RN

## 2012-09-28 NOTE — Care Management Note (Signed)
Case Management following for d/c needs, noted in progress notes of 09/27/2012 daughter request for [facility], however not skilled needs noted at this time that would justify HHRN.  Please clarify. Pt would most likely benefit from HHPT and HHOT, would you agree? Will arrange [facility] when ordered.  Thank you? [name] RN MPH Case Manager [phone]

## 2012-09-28 NOTE — Progress Notes (Signed)
Occupational Therapy Treatment Patient Details Name: Rachel Bridges MRN: [id number] DOB: December 23, 1924 Today's Date: 09/28/2012 Time: 2956-2130 OT Time Calculation (min): 33 min  OT Assessment / Plan / Recommendation Comments on Treatment Session This 79 yo female admitted nausea/vomiting and found to have flu presents to acute OT making progress; however still really debiliated. Would benefit from SNF; however pt refusing so will need HHOT and 24 hour A/S.    Follow Up Recommendations  Home health OT       Equipment Recommendations  None recommended by OT       Frequency Min 2X/week   Plan Discharge plan needs to be updated    Precautions / Restrictions Precautions Precautions: Fall Precaution Comments: Contact Droplet Restrictions Weight Bearing Restrictions: No   Pertinent Vitals/Pain Pain in Bil feet with ambulation--repositioned and made RN aware    ADL  Toilet Transfer: Performed;Minimal assistance Toilet Transfer Method: Sit to stand Toilet Transfer Equipment: Raised toilet seat with arms (or 3-in-1 over toilet) Toileting - Clothing Manipulation and Hygiene: Performed;+1 Total assistance (Pt able to maintain standing with min guard A) Where Assessed - Toileting Clothing Manipulation and Hygiene: Standing Equipment Used: Rolling walker;Gait belt Transfers/Ambulation Related to ADLs: Mod A sit to stand from bed; Min A sit to stand from 3n1 and stand to sit to 3n1 and recliner; min A ambulation with RW     OT Goals ADL Goals Pt Will Transfer to Toilet: with min assist;Ambulation;with DME;Regular height toilet;Comfort height toilet;3-in-1;Grab bars ADL Goal: Statistician - Progress: Progressing toward goals (however fatigues easily) ADL Goal: Toileting - Clothing Manipulation - Progress: Not progressing (same as eval) ADL Goal: Toileting - Hygiene - Progress: Not progressing (same as eval) Miscellaneous OT Goals OT Goal: Miscellaneous Goal #1 - Progress: Progressing  toward goals  Visit Information  Last OT Received On: 09/28/12 Assistance Needed: +1    Subjective Data  Subjective: "My feet are hurting"      Cognition  Cognition Arousal/Alertness: Awake/alert Behavior During Therapy: WFL for tasks assessed/performed Overall Cognitive Status: History of cognitive impairments - at baseline    Mobility  Bed Mobility Bed Mobility: Supine to Sit;Sitting - Scoot to Edge of Bed Supine to Sit: 4: Min assist;HOB elevated;With rails Sitting - Scoot to Edge of Bed: 3: Mod assist;With rail Transfers Transfers: Sit to Stand;Stand to Sit Sit to Stand: With upper extremity assist (Mod A from bed, min A from 3n1) Stand to Sit: 4: Min assist;With upper extremity assist;With armrests;To chair/3-in-1 Details for Transfer Assistance: VCs for safe hand placement          End of Session OT - End of Session Equipment Utilized During Treatment: Gait belt Activity Tolerance: Patient limited by fatigue (however did more activity with me today v. eval) Patient left: in chair;with call bell/phone within reach;with chair alarm set Nurse Communication:  (front desk: Need to have a bowel movement)       Evette Georges [phone] 09/28/2012, 2:26 PM

## 2012-09-28 NOTE — Progress Notes (Signed)
Subjective: Pt doing well this AM. No acute events. 4 stools overnight nothing above baseline.   Objective: Vital signs in last 24 hours: Filed Vitals:   09/27/12 1239 09/27/12 1709 09/27/12 2051 09/28/12 0423  BP: 98/56 95/52 126/70 156/75  Pulse: 56 77 64 59  Temp: 97.6 F (36.4 C) 99.3 F (37.4 C) 98.5 F (36.9 C) 97.9 F (36.6 C)  TempSrc:   Oral Oral  Resp: 18 18 20 19   Height:   5\' 7"  (1.702 m)   Weight:   120 lb 8 oz (54.658 kg)   SpO2: 96% 93% 96% 97%   Weight change: -15.6 oz (-0.442 kg)  Intake/Output Summary (Last 24 hours) at 09/28/12 1025 Last data filed at 09/27/12 1817  Gross per 24 hour  Intake 913.33 ml  Output    300 ml  Net 613.33 ml   General: elderly, pleasant woman resting in bed, NAD  HEENT: Pharynx Pink and moist, no erythema, and no exudates.  Cardiac: Regular Rate and Rhythm, no rubs, murmurs or gallops. No JVD appreciated  Pulm: Has mild wheezing bilaterally. No rales or rhonchi  Abd: abdomen soft, nontender, nondistended, with normoactive BS  Ext: No edema, with poor skin turgor. Has ecchymosis over L knee, no joint effusions, pain or decreased ROM.  Neuro: alert and oriented to person and place, not to date. Stable. Cranial nerves II-XII grossly intact, strength and sensation to light touch equal in bilateral upper and lower extremities. Normally interactive, good eye contact, not anxious appearing, and not depressed appearing.  Lab Results: Basic Metabolic Panel:  Recent Labs Lab 09/26/12 0605 09/27/12 0452 09/28/12 0405  NA 145 142 144  K 3.8 3.2* 4.1  CL 120* 116* 117*  CO2 17* 19 20  GLUCOSE 83 110* 130*  BUN 13 15 17   CREATININE 1.35* 1.43* 1.18*  CALCIUM 7.7* 7.4* 7.6*  MG 2.1  --  1.8   Liver Function Tests:  Recent Labs Lab 09/23/12 1027  AST 18  ALT 11  ALKPHOS 95  BILITOT 0.2*  PROT 6.3  ALBUMIN 2.5*    Recent Labs Lab 09/23/12 1027  LIPASE 30  AMYLASE 23   CBC:  Recent Labs Lab 09/23/12 1027  09/24/12 0540  WBC 6.2 3.1*  NEUTROABS 4.3  --   HGB 11.7* 10.8*  HCT 36.2 33.0*  MCV 85.8 86.8  PLT 147* 100*   Thyroid Function Tests:  Recent Labs Lab 09/23/12 2043  TSH 4.199   Urinalysis:  Recent Labs Lab 09/23/12 1210  COLORURINE YELLOW  LABSPEC 1.017  PHURINE 5.5  GLUCOSEU NEGATIVE  HGBUR NEGATIVE  BILIRUBINUR SMALL*  KETONESUR NEGATIVE  PROTEINUR 30*  UROBILINOGEN 0.2  NITRITE NEGATIVE  LEUKOCYTESUR NEGATIVE   Micro Results: Recent Results (from the past 240 hour(s))  URINE CULTURE     Status: None   Collection Time    09/23/12 12:10 PM      Result Value Range Status   Specimen Description URINE, CATHETERIZED   Final   Special Requests NONE   Final   Culture  Setup Time 09/23/2012 13:12   Final   Colony Count NO GROWTH   Final   Culture NO GROWTH   Final   Report Status 09/24/2012 FINAL   Final  CLOSTRIDIUM DIFFICILE BY PCR     Status: None   Collection Time    09/25/12  6:44 AM      Result Value Range Status   C difficile by pcr NEGATIVE  NEGATIVE Final  Studies/Results: No results found. Medications: I have reviewed the patient's current medications. Scheduled Meds: . aspirin EC  81 mg Oral Daily  . enoxaparin (LOVENOX) injection  30 mg Subcutaneous Q24H  . feeding supplement  1 Container Oral BID BM  . hydrocortisone  10 mg Oral Daily  . hydrocortisone  5 mg Oral Q1500  . sertraline  50 mg Oral Daily  . sodium chloride  3 mL Intravenous Q12H  . sodium chloride  3 mL Intravenous Q12H   Continuous Infusions: . sodium chloride 10 mL/hr (09/25/12 1010)   PRN Meds:.sodium chloride, acetaminophen, acetaminophen, albuterol, guaifenesin, loperamide, ondansetron (ZOFRAN) IV, ondansetron, sodium chloride Assessment/Plan:  # influenza B infection: Pt presented with viral prodrome like symptoms and similar symptoms in daughter who is primary caregiver. Infectious workup included UCx NTD, lactic acid wnl (indicating no ischemic colitis) , c. Diff  pcr negative and flu pcr + influenza B negative H1N1 and pt hadn't received flu vaccine this year. ACS r/o with negative trops and unchanged EKGs, TSH wnl. KUB negative for acute obstruction but consistent with previously known ileus. Patient still has dry cough and wheezing which is most likely from her flu..  - Tamiflu and zofran prn - supportive management - add robitussin for cough and prn Albuterol for wheezing  #Adrenal insuffiencey: in acute illness will increase pt solucortef from home dose to 20mg  qAM and 10 mg qPM for 3 days (started on 5/29- 6/1).   -Will go back to home dose of cortef: 10 mg in AM and 5 mg in PM   # Hypokalemia: K=4.0 this AM in setting of diarrhea  - f/u BMP and Mg   # AKI-resolved: in setting of dehydration given decreased po intake and diarrhea. Cr. 1.6>>1.35-->1.43>>1.18 -  IV NS 100 ml/h X 12 hrs -trend Bmet  #Severe protein calorie malnutrition Albumin 2.5. Patient is cachetic .Likely multifactorial with component of gluten and lactose intolerance, gut dysmotility, recurrent diarrhea. C diff pcr negative  - start Gluten free diet - start  loperamide - Nutritional services consulted   #:  Gluten intolerrance: according to record in Epic,  Dr. Loreta Ave made dx. August 2011 endoscopy with biopsy was negative for celiac disease. Prior to this patient had elevated TTG IgG of 93 (normal <20). Dr Loreta Ave made clinical diagnosis of gluten intolerance based on diarrhea/symptoms from eating gluten.   - Gluten free diet  #Chronic normocytic anemia  Likely anemia of chronic disease. Has h/o of B12 deficiency which is treated w monthly injections, last level within normal limits. Hgb 11.7 on admission, baseline.  - Hgb 10.8, decrease likely 2/2 to fluid replacement, monitor     Dispo: Disposition is deferred at this time, awaiting improvement of current medical problems  Anticipated discharge home on 6/2.   PT/OT recommended SNF placement, but patient strongly  refused it due to a bad experience in the past. I spoke with one of her daughters, Ms. Sloyd on the phone. Her daughter would like patient be discharged home this time with Home health RN for help. HH was set up and pt to be d/c on 6/2  The patient does have a current PCP (GARG, ANKIT, MD), therefore will be requiring OPC follow-up after discharge.   The patient does not have transportation limitations that hinder transportation to clinic appointments.  .Services Needed at time of discharge: Y = Yes, Blank = No PT:   OT:   RN:   Equipment:   Other:     LOS: 5  days   Christen Bame 09/28/2012, 10:25 AM Pgr: [phone]

## 2012-09-28 NOTE — Care Management Note (Signed)
   CARE MANAGEMENT NOTE 09/28/2012  Patient:  Rachel Bridges, Rachel Bridges   Account Number:  [id number]  Date Initiated:  09/28/2012  Documentation initiated by:  [name]  Subjective/Objective Assessment:   Pt and family declining SNF, pt may need HHPT and HHOT, noted request for [facility], however no skilled need identified.     Action/Plan:   Case manager following for [facility] needs.  09/28/12 calls placed to pt daughter, who states that both daughter be come to the hospital @ 1pm. CM will meet and order [facility] as needed.   Anticipated DC Date:  09/28/2012   Anticipated DC Plan:  HOME W HOME HEALTH SERVICES         Choice offered to / List presented to:          [facility] arranged  HH-2 PT  HH-3 OT  HH-4 NURSE'S AIDE      HH agency  [facility]   Status of service:  Completed, signed off Medicare Important Message given?   (If response is "NO", the following Medicare IM given date fields will be blank) Date Medicare IM given:   Date Additional Medicare IM given:    Discharge Disposition:  HOME W HOME HEALTH SERVICES  Per UR Regulation:    If discussed at Long Length of Stay Meetings, dates discussed:    Comments:  09/28/2012 Spoke with pt daughter, [name] , who provides 24 hr care for this pt and she wishes to use [place] for HHPT/OT and aide. Mary with [name] notified. Family would also like to use PT, Minerva Areola as he was able to work successfully with this patient previously. [name] RN MPH Case Manager [phone]

## 2012-10-02 ENCOUNTER — Encounter: Payer: Self-pay | Admitting: Internal Medicine

## 2012-10-02 ENCOUNTER — Ambulatory Visit (INDEPENDENT_AMBULATORY_CARE_PROVIDER_SITE_OTHER): Payer: Medicare Other | Admitting: Internal Medicine

## 2012-10-02 VITALS — BP 101/63 | HR 61 | Temp 97.2°F | Wt 118.1 lb

## 2012-10-02 DIAGNOSIS — K9041 Non-celiac gluten sensitivity: Secondary | ICD-10-CM

## 2012-10-02 DIAGNOSIS — J111 Influenza due to unidentified influenza virus with other respiratory manifestations: Secondary | ICD-10-CM

## 2012-10-02 DIAGNOSIS — J101 Influenza due to other identified influenza virus with other respiratory manifestations: Secondary | ICD-10-CM

## 2012-10-02 DIAGNOSIS — K900 Celiac disease: Secondary | ICD-10-CM

## 2012-10-02 DIAGNOSIS — E876 Hypokalemia: Secondary | ICD-10-CM

## 2012-10-02 NOTE — Assessment & Plan Note (Signed)
She has chronic diarrhea secondary to noncompliance with a gluten-free diet.  She says she feels better recently with only occasional diarrhea.  I've discussed with her and her daughter about importance of gluten-free diet  -verbal and written education given to patient and her daughter

## 2012-10-02 NOTE — Assessment & Plan Note (Signed)
Patient was diagnosed with influenza PE during admission in May 2014.  She did not receive influenza vaccine last year.  She was treated with Tamiflu full course and is discharged home with home health.  She reports that her symptoms are completely resolved, and she is feeling fine now. -Education on importance of the yearly influenza vaccine given to patient and her daughter. -Patient and her daughter was understanding, and will get the influenza vaccine this year

## 2012-10-02 NOTE — Progress Notes (Signed)
Subjective:   Patient ID: Rachel Bridges female   DOB: [birth year] 79 y.o.   MRN: [id number]  HPI: [name] is a 79 y.o. woman with PMH of adrenal suppression followed by Dr. Sharl Ma, celiac disease status post colectomy and noncompliance with gluten dietary restrictions,  MGUS, colon cancer, dementia and depression who presents to clinic for hospital followup.  Patient was admitted to hospital for nausea, vomiting, diarrhea and abdominal pain, and was noted to have influenza B infection.  She was treated with Tamiflu along with supportive management with Robitussin for cough and the when necessary albuterol for wheezing.  She was evaluated by PT/OT and recommended sniff the patient and daughter refused and requested home health.  Patient states that she's been fine since she was discharged from hospital.   She has a chronic diarrhea secondary to noncompliant with a gluten-free diet.  She says she has occasional diarrhea which is at her baseline.   She states that she has a slightly difficulty to start urination, and she is fine once she started urination.  She reports occasional dysuria, but no persistent dysuria, urinary frequency or urgency.  Denies fever and chills.  Denies chest pain, chest pressure or shortness breath. Denies nausea, vomiting or abdominal pain. She likes home health PT and OT therapy.      Past Medical History  Diagnosis Date  . MGUS (monoclonal gammopathy of unknown significance)      [facility]  . Anxiety   . History of colon cancer      History of bowel  obstruction. on treatment with Colace and MiraLax.  followed by Dr. Loreta Ave and Dr. Purnell Shoemaker  . Depression   . Diverticulosis of colon   . Osteopenia   . Lumbar spinal stenosis   . Atrophic vaginitis   . Renal cyst   . History of tobacco abuse     at least 64 pack year. Started about age 10 and continued until about age 84.  Marland Kitchen Dermatitis   . Hip pain      History   . Actinic keratosis   . Dementia   . Adrenal suppression     central, on chronic steroids, followed by Dr. Sharl Ma of endocrinology  . Gluten intolerance     August 2011 endoscopy with biopsy negative for celiac disease. Prior to this patient had elevated TTG IgG of 93 (normal <20). Dr Loreta Ave made clinical diagnosis of gluten intolerance based on diarrhea/symptoms from eating gluten. Needs GF diet.    Current Outpatient Prescriptions  Medication Sig Dispense Refill  . acetaminophen (TYLENOL) 325 MG tablet Take 325 mg by mouth once.      Marland Kitchen aspirin 81 MG tablet Take 81 mg by mouth daily.       . calcium citrate-vitamin D (CITRACAL+D) 315-200 MG-UNIT per tablet Take 1 tablet by mouth 2 (two) times daily.  60 tablet  11  . cyanocobalamin (,VITAMIN B-12,) 1000 MCG/ML injection Inject 1 mL (1,000 mcg total) into the muscle every 30 (thirty) days.  1 mL  11  . feeding supplement (ENSURE COMPLETE) LIQD Take 237 mLs by mouth daily. Vanilla.  Hasn't been giving since the sickness started.      . furosemide (LASIX) 20 MG tablet Take 1 tablet (20 mg total) by mouth daily.  45 tablet  5  . guaifenesin (ROBITUSSIN) 100 MG/5ML syrup Take 5 mLs (100 mg total) by mouth every 4 (four) hours as needed for congestion.  120  mL  0  . hydrocortisone (CORTEF) 10 MG tablet Take 5-10 mg by mouth 2 (two) times daily. 10 mg in the a.m. And 5 mg in the afternoon.      . ondansetron (ZOFRAN ODT) 8 MG disintegrating tablet Take 1 tablet (8 mg total) by mouth every 8 (eight) hours as needed for nausea.  10 tablet  0  . paricalcitol (ZEMPLAR) 1 MCG capsule Take 1 mcg by mouth every Monday, Wednesday, and Friday.      . potassium chloride (K-DUR) 10 MEQ tablet Take 20 mEq by mouth 2 (two) times daily.      . sertraline (ZOLOFT) 50 MG tablet Take 1 tablet (50 mg total) by mouth daily.  30 tablet  5  . simethicone (MYLICON) 80 MG chewable tablet Chew 160 mg by mouth once.       No current facility-administered medications for this  visit.   No family history on file. History   Social History  . Marital Status: Widowed    Spouse Name: N/A    Number of Children: N/A  . Years of Education: N/A   Social History Main Topics  . Smoking status: Former Smoker -- 1.00 packs/day for 24 years    Types: Cigarettes    Quit date: 08/01/1988  . Smokeless tobacco: None  . Alcohol Use: No  . Drug Use: No  . Sexually Active: No   Other Topics Concern  . None   Social History Narrative    Husband died of myeloma.    Patient is to daughter's one of whom she lives with her.   Review of Systems: Review of Systems:  Constitutional:  Denies fever, chills, diaphoresis, appetite change and fatigue.   HEENT:  Denies congestion, sore throat, rhinorrhea, sneezing, mouth sores, trouble swallowing, neck pain   Respiratory:  Denies SOB, DOE, cough, and wheezing.   Cardiovascular:  Denies palpitations and leg swelling.   Gastrointestinal:  Denies nausea, vomiting, abdominal pain,constipation, blood in stool and abdominal distention.  Positive for chronic diarrhea   Genitourinary:  Denies dysuria, urgency, frequency, hematuria, flank pain and difficulty urinating.   Musculoskeletal:  Denies myalgias, back pain, joint swelling, arthralgias and gait problem.   Skin:  Denies pallor, rash and wound.   Neurological:  Denies dizziness, seizures, syncope, weakness, light-headedness, numbness and headaches.    .    Objective:  Physical Exam: Filed Vitals:   10/02/12 1432  BP: 101/63  Pulse: 61  Temp: 97.2 F (36.2 C)  TempSrc: Oral  Weight: 118 lb 1.6 oz (53.57 kg)  SpO2: 96%   General: alert,  cooperative to examination.  Fragile elderly female  Head: normocephalic and atraumatic.  Eyes: vision grossly intact, pupils equal, pupils round, pupils reactive to light, no injection and anicteric.  Mouth: pharynx pink and moist, no erythema, and no exudates.  Neck: supple, full ROM, no thyromegaly, no JVD, and no carotid bruits.   Lungs: normal respiratory effort, no accessory muscle use, normal breath sounds, no crackles, and no wheezes. Heart: normal rate, regular rhythm, no murmur, no gallop, and no rub.  Abdomen: soft, non-tender, normal bowel sounds, no distention, no guarding, no rebound tenderness, no hepatomegaly, and no splenomegaly.  Msk: no joint swelling, no joint warmth, and no redness over joints.  Pulses: 2+ DP/PT pulses bilaterally Extremities: No cyanosis, clubbing, edema Neurologic: alert & oriented X3, cranial nerves II-XII intact, strength normal in all extremities, sensation intact to light touch, and gait normal.  Skin: turgor normal and no  rashes.  Psych: Oriented X3, memory intact for recent and remote, normally interactive, good eye contact, not anxious appearing, and not depressed appearing.     Assessment & Plan:

## 2012-10-02 NOTE — Assessment & Plan Note (Signed)
Completely resolved upon discharge.  She is on home potassium supplement dosage.  - No need to check her BMP today

## 2012-10-02 NOTE — Patient Instructions (Addendum)
1. Follow up in 3 months  Gluten-Free Diet Gluten is a protein found in many grains. Gluten is present in wheat, rye, and barley. Gluten from wheat, rye, and barley protein interferes with the absorption of food in people with gluten sensitivity. It may also cause intestinal injury when eaten by individuals with gluten sensitivity.  A sample piece (biopsy) of the small intestine is usually required for a positive diagnosis of gluten sensitivity. Dietary treatment consists of eliminating foods and food ingredients from wheat, rye, and barley. When these are taken out of the diet completely, most people regain function of the small intestine. Strict compliance is important even during symptom-free periods. People with gluten sensitivity need to be on a gluten-free diet for a lifetime. During the first stages of treatment, some people will also need to restrict dairy products that contain lactose, which is a naturally occurring sugar. Lactose is difficult to absorb when the small intestines are damaged (lactose intolerance).  WHO NEEDS THIS DIET Some people who have certain diseases need to be on a gluten-free diet. These diseases include:  Celiac disease.  Nontropical sprue.  Gluten-sensitive enteropathy.  Dermatitis herpetiformis. SPECIAL NOTES  Read all labels because gluten may have been added as an incidental ingredient. Words to check for on the label include: flour, starch, durum flour, graham flour, phosphated flour, self-rising flour, semolina, farina, modified food starch, cereal, thickening, fillers, emulsifiers, any kind of malt flavoring, and hydrolyzed vegetable protein. A registered dietician can help you identify possible harmful ingredients in the foods you normally eat.  If you are not sure whether an ingredient contains gluten, check with the manufacturer. Note that some manufacturers may change ingredients without notice. Always read labels.  Since flour and cereal products  are often used in the preparation of foods, it is important to be aware of the methods of preparation used, as well as the foods themselves. This is especially true when you are dining out. Starches  Allowed: Only those prepared from arrowroot, corn, potato, rice, and bean flours. Rice wafers(*), pure cornmeal tortillas, popcorn, some crackers, and chips(*). Hot cereals made from cornmeal. Ask your dietician which specific hot and cold cereals are allowed. White or sweet potatoes, yams, hominy, rice or wild rice, and special gluten-free pasta. Some oriental rice noodles or bean noodles.  Avoid: All wheat and rye cereals, wheat germ, barley, bran, graham, malt, bulgur, and millet(-). NOTE: Avoid cereals containing malt as a flavoring, such as rice cereal. Regular noodles, spaghetti, macaroni, and most packaged rice mixes(*). All others containing wheat, rye, or barley. Vegetables  Allowed: All plain, fresh, frozen, or canned vegetables.  Avoid: Creamed vegetables(*) and vegetables canned in sauces(*). Any prepared with wheat, rye, or barley. Fruit  Allowed: All fresh, frozen, canned, or dried fruits. Fruit juices.  Avoid: Thickened or prepared fruits and some pie fillings(*). Meat and Meat Substitutes  Allowed: Meat, fish, poultry, or eggs prepared without added wheat, rye, or barley. Luncheon meat(*), frankfurters(*), and pure meat. All aged cheese and processed cheese products(*). Cottage cheese(+) and cream cheese(+). Dried beans, dried peas, and lentils.  Avoid: Any meat or meat alternate containing wheat, rye, barley, or gluten stabilizers. Bread-containing products, such as Swiss steak, croquettes, and meatloaf. Tuna canned in vegetable broth(*); [place] with HVP injected as part of the basting; any cheese product containing oat gum as an ingredient. Milk  Allowed: Milk. Yogurt made with allowed ingredients(*).  Avoid: Commercial chocolate milk which may have cereal added(*). Malted  milk. Soups and  Combination Foods  Allowed: Homemade broth and soups made with allowed ingredients; some canned or frozen soups are allowed(*). Combination or prepared foods that do not contain gluten(*). Read labels.  Avoid: All soups containing wheat, rye, or barley flour. Bouillon and bouillon cubes that contain hydrolyzed vegetable protein (HVP). Combination or prepared foods that contain gluten(*). Desserts  Allowed:  Custard, some pudding mixes(*), homemade puddings from cornstarch, rice, and tapioca. Gelatin desserts, ices, and sherbet(*). Cake, cookies, and other desserts prepared with allowed flours. Some commercial ice creams(*). Ask your dietician about specific brands of dessert that are allowed.  Avoid: Cakes, cookies, doughnuts, and pastries that are prepared with wheat, rye, or barley flour. Some commercial ice creams(*), ice cream flavors which contain cookies, crumbs, or cheesecake(*). Ice cream cones. All commercially prepared mixes for cakes, cookies, and other desserts(*). Bread pudding and other puddings thickened with flour. Sweets  Allowed: Sugar, honey, syrup(*), molasses, jelly, jam, plain hard candy, marshmallows, gumdrops, homemade candies free from wheat, rye, or barley. Coconut.  Avoid: Commercial candies containing wheat, rye, or barley(*). Certain buttercrunch toffees are dusted with wheat flour. Ask your dietician about specific brands that are not allowed. Chocolate-coated nuts, which are often rolled in flour. Fats and Oils  Allowed: Butter, margarine, vegetable oil, sour cream(+), whipping cream, shortening, lard, cream, mayonnaise(*). Some commercial salad dressings(*). Peanut butter.  Avoid: Some commercial salad dressings(*). Beverages  Allowed: Coffee (regular or decaffeinated), tea, herbal tea (read label to be sure that no wheat flour has been added). Carbonated beverages and some root beers(*). Wine, sake, and distilled spirits, such as gin, vodka, and  whiskey.  Avoid:  Certain cereal beverages. Ask your dietician about specific brands that are not allowed. Beer (unless gluten-free), ale, malted milk, and some root beers, wine, and sake. Condiments/ Miscellaneous  Allowed: Salt, pepper, herbs, spices, extracts, and food colorings. Monosodium glutamate (MSG). Cider, rice, and wine vinegar. Baking soda and baking powder. Certain soy sauces. Ask your dietician about specific brands that are allowed. Nuts, coconut, chocolate, and pure cocoa powder.  Avoid: Some curry powder(*), some dry seasoning mixes(*), some gravy extracts(*), some meat sauces(*), some catsup(*), some prepared mustard(*), horseradish(*), some soy sauce(*), chip dips(*), and some chewing gum(*). Yeast extract (contains barley). Caramel color (may contain malt). Ask your dietician about specific brands of condiments to avoid. Flour and Thickening Agents  Allowed: Arrowroot starch (A); Corn bran (B); Corn flour (B,C,D); Corn germ (B); Cornmeal (B,C,D); Corn starch (A); Potato flour (B,C,E); Potato starch flour (B,C,E); Rice bran (B); Rice flours: Plain, brown (B,C,D,E), and Sweet (A,B,C,F). Rice polish (B,C,G); Soy flour (B,C,G); Tapioca starch (A). The flour and thickening agents described above are good for: (A) Good thickening agent (B) Good when combined with other flours (C) Best combined with milk and eggs in baked products (D) Best in grainy-textured products (E) Produces drier product than other flours (F) Produces moister product than other flours (G) Adds distinct flavor to product. Use in moderation. (*) Check labels and investigate any questionable ingredients.  (-) Additional research is needed before this product can be recommended. (+) Check vegetable gum used. SAMPLE MEAL PLAN Breakfast   Orange juice.  Banana.  Rice or corn cereal.  Toast (gluten-free bread).  Heart-healthy tub margarine.  Jam.  Milk.  Coffee or tea. Lunch  Chicken salad  sandwich (with gluten-free bread and mayonnaise).  Sliced tomatoes.  Heart-healthy tub margarine.  Apple.  Milk.  Coffee or tea. Dinner  [place].  Baked potato.  Broccoli.  Lettuce salad with gluten-free dressing.  Gluten-free bread.  Custard.  Heart-healthy margarine.  Coffee or tea. These meal plans are provided as samples. Your daily meal plans will vary. Document Released: 04/15/2005 Document Revised: 10/15/2011 Document Reviewed: 05/26/2011 [facility] Patient Information 2014 [place], [place].

## 2012-10-05 NOTE — Progress Notes (Signed)
Case discussed with Dr. Li (at time of visit, soon after the resident saw the patient).  We reviewed the resident's history and exam and pertinent patient test results.  I agree with the assessment, diagnosis, and plan of care documented in the resident's note.  

## 2012-10-10 ENCOUNTER — Other Ambulatory Visit: Payer: Self-pay | Admitting: Internal Medicine

## 2012-10-14 ENCOUNTER — Emergency Department (HOSPITAL_COMMUNITY): Payer: Medicare Other

## 2012-10-14 ENCOUNTER — Inpatient Hospital Stay (HOSPITAL_COMMUNITY)
Admission: EM | Admit: 2012-10-14 | Discharge: 2012-10-19 | DRG: 388 | Disposition: A | Discharge status: Home Health | Payer: Medicare Other | Attending: Internal Medicine | Admitting: Internal Medicine

## 2012-10-14 ENCOUNTER — Telehealth: Payer: Self-pay | Admitting: *Deleted

## 2012-10-14 ENCOUNTER — Encounter (HOSPITAL_COMMUNITY): Payer: Self-pay | Admitting: Family Medicine

## 2012-10-14 DIAGNOSIS — E860 Dehydration: Secondary | ICD-10-CM | POA: Diagnosis present

## 2012-10-14 DIAGNOSIS — Z888 Allergy status to other drugs, medicaments and biological substances status: Secondary | ICD-10-CM

## 2012-10-14 DIAGNOSIS — Z96659 Presence of unspecified artificial knee joint: Secondary | ICD-10-CM

## 2012-10-14 DIAGNOSIS — F329 Major depressive disorder, single episode, unspecified: Secondary | ICD-10-CM | POA: Diagnosis present

## 2012-10-14 DIAGNOSIS — K5730 Diverticulosis of large intestine without perforation or abscess without bleeding: Secondary | ICD-10-CM | POA: Diagnosis present

## 2012-10-14 DIAGNOSIS — G309 Alzheimer's disease, unspecified: Secondary | ICD-10-CM | POA: Diagnosis present

## 2012-10-14 DIAGNOSIS — Z85038 Personal history of other malignant neoplasm of large intestine: Secondary | ICD-10-CM

## 2012-10-14 DIAGNOSIS — E876 Hypokalemia: Secondary | ICD-10-CM | POA: Diagnosis present

## 2012-10-14 DIAGNOSIS — Z9119 Patient's noncompliance with other medical treatment and regimen: Secondary | ICD-10-CM

## 2012-10-14 DIAGNOSIS — F3289 Other specified depressive episodes: Secondary | ICD-10-CM | POA: Diagnosis present

## 2012-10-14 DIAGNOSIS — Z66 Do not resuscitate: Secondary | ICD-10-CM | POA: Diagnosis not present

## 2012-10-14 DIAGNOSIS — Z9089 Acquired absence of other organs: Secondary | ICD-10-CM

## 2012-10-14 DIAGNOSIS — K9041 Non-celiac gluten sensitivity: Secondary | ICD-10-CM | POA: Diagnosis present

## 2012-10-14 DIAGNOSIS — E43 Unspecified severe protein-calorie malnutrition: Secondary | ICD-10-CM | POA: Diagnosis present

## 2012-10-14 DIAGNOSIS — N179 Acute kidney failure, unspecified: Secondary | ICD-10-CM | POA: Diagnosis present

## 2012-10-14 DIAGNOSIS — R112 Nausea with vomiting, unspecified: Secondary | ICD-10-CM | POA: Diagnosis present

## 2012-10-14 DIAGNOSIS — Z91199 Patient's noncompliance with other medical treatment and regimen due to unspecified reason: Secondary | ICD-10-CM

## 2012-10-14 DIAGNOSIS — E2740 Unspecified adrenocortical insufficiency: Secondary | ICD-10-CM | POA: Diagnosis present

## 2012-10-14 DIAGNOSIS — Z981 Arthrodesis status: Secondary | ICD-10-CM

## 2012-10-14 DIAGNOSIS — E236 Other disorders of pituitary gland: Secondary | ICD-10-CM | POA: Diagnosis present

## 2012-10-14 DIAGNOSIS — N183 Chronic kidney disease, stage 3 unspecified: Secondary | ICD-10-CM | POA: Diagnosis present

## 2012-10-14 DIAGNOSIS — N390 Urinary tract infection, site not specified: Secondary | ICD-10-CM | POA: Diagnosis present

## 2012-10-14 DIAGNOSIS — K529 Noninfective gastroenteritis and colitis, unspecified: Secondary | ICD-10-CM | POA: Diagnosis present

## 2012-10-14 DIAGNOSIS — D472 Monoclonal gammopathy: Secondary | ICD-10-CM | POA: Diagnosis present

## 2012-10-14 DIAGNOSIS — K900 Celiac disease: Secondary | ICD-10-CM | POA: Diagnosis present

## 2012-10-14 DIAGNOSIS — Z9849 Cataract extraction status, unspecified eye: Secondary | ICD-10-CM

## 2012-10-14 DIAGNOSIS — F0390 Unspecified dementia without behavioral disturbance: Secondary | ICD-10-CM | POA: Diagnosis present

## 2012-10-14 DIAGNOSIS — K567 Ileus, unspecified: Secondary | ICD-10-CM | POA: Diagnosis present

## 2012-10-14 DIAGNOSIS — Z681 Body mass index (BMI) 19 or less, adult: Secondary | ICD-10-CM

## 2012-10-14 DIAGNOSIS — R197 Diarrhea, unspecified: Secondary | ICD-10-CM | POA: Diagnosis present

## 2012-10-14 DIAGNOSIS — R627 Adult failure to thrive: Secondary | ICD-10-CM | POA: Diagnosis present

## 2012-10-14 DIAGNOSIS — Z7982 Long term (current) use of aspirin: Secondary | ICD-10-CM

## 2012-10-14 DIAGNOSIS — IMO0002 Reserved for concepts with insufficient information to code with codable children: Secondary | ICD-10-CM | POA: Diagnosis present

## 2012-10-14 DIAGNOSIS — E2749 Other adrenocortical insufficiency: Secondary | ICD-10-CM | POA: Diagnosis present

## 2012-10-14 DIAGNOSIS — Z79899 Other long term (current) drug therapy: Secondary | ICD-10-CM

## 2012-10-14 DIAGNOSIS — K560 Paralytic ileus: Principal | ICD-10-CM | POA: Diagnosis present

## 2012-10-14 DIAGNOSIS — Z87891 Personal history of nicotine dependence: Secondary | ICD-10-CM

## 2012-10-14 DIAGNOSIS — R109 Unspecified abdominal pain: Secondary | ICD-10-CM

## 2012-10-14 DIAGNOSIS — N182 Chronic kidney disease, stage 2 (mild): Secondary | ICD-10-CM | POA: Diagnosis present

## 2012-10-14 DIAGNOSIS — F0280 Dementia in other diseases classified elsewhere without behavioral disturbance: Secondary | ICD-10-CM | POA: Diagnosis present

## 2012-10-14 DIAGNOSIS — Z515 Encounter for palliative care: Secondary | ICD-10-CM

## 2012-10-14 DIAGNOSIS — F411 Generalized anxiety disorder: Secondary | ICD-10-CM | POA: Diagnosis present

## 2012-10-14 HISTORY — DX: Malignant neoplasm of colon, unspecified: C18.9

## 2012-10-14 HISTORY — DX: Shortness of breath: R06.02

## 2012-10-14 HISTORY — DX: Unspecified osteoarthritis, unspecified site: M19.90

## 2012-10-14 LAB — CBC WITH DIFFERENTIAL/PLATELET
Basophils Absolute: 0 10*3/uL (ref 0.0–0.1)
Basophils Relative: 0 % (ref 0–1)
HCT: 36.8 % (ref 36.0–46.0)
Lymphocytes Relative: 12 % (ref 12–46)
MCHC: 33.7 g/dL (ref 30.0–36.0)
Neutro Abs: 6.3 10*3/uL (ref 1.7–7.7)
Neutrophils Relative %: 85 % — ABNORMAL HIGH (ref 43–77)
Platelets: 202 10*3/uL (ref 150–400)
RDW: 15.5 % (ref 11.5–15.5)
WBC: 7.5 10*3/uL (ref 4.0–10.5)

## 2012-10-14 LAB — COMPREHENSIVE METABOLIC PANEL
ALT: 9 U/L (ref 0–35)
AST: 12 U/L (ref 0–37)
Albumin: 2.8 g/dL — ABNORMAL LOW (ref 3.5–5.2)
CO2: 22 mEq/L (ref 19–32)
Chloride: 104 mEq/L (ref 96–112)
Creatinine, Ser: 1.24 mg/dL — ABNORMAL HIGH (ref 0.50–1.10)
GFR calc non Af Amer: 38 mL/min — ABNORMAL LOW (ref >90)
Potassium: 3.1 mEq/L — ABNORMAL LOW (ref 3.5–5.1)
Sodium: 141 mEq/L (ref 135–145)
Total Bilirubin: 0.3 mg/dL (ref 0.3–1.2)

## 2012-10-14 MED ORDER — MAGNESIUM SULFATE IN D5W 10-5 MG/ML-% IV SOLN
1.0000 g | Freq: Once | INTRAVENOUS | Status: AC
  Administered 2012-10-14: 1 g via INTRAVENOUS
  Filled 2012-10-14: qty 100

## 2012-10-14 MED ORDER — ONDANSETRON HCL 4 MG/2ML IJ SOLN
4.0000 mg | Freq: Once | INTRAMUSCULAR | Status: AC
  Administered 2012-10-14: 4 mg via INTRAVENOUS
  Filled 2012-10-14: qty 2

## 2012-10-14 MED ORDER — DEXTROSE-NACL 5-0.45 % IV SOLN
INTRAVENOUS | Status: DC
  Administered 2012-10-14 – 2012-10-15 (×2): via INTRAVENOUS

## 2012-10-14 MED ORDER — ONDANSETRON HCL 4 MG/2ML IJ SOLN: 4.0000 mg | Freq: Four times a day (QID) | INTRAMUSCULAR | Status: DC | PRN

## 2012-10-14 MED ORDER — HEPARIN SODIUM (PORCINE) 5000 UNIT/ML IJ SOLN
5000.0000 [IU] | Freq: Three times a day (TID) | INTRAMUSCULAR | Status: DC
  Administered 2012-10-14 – 2012-10-19 (×14): 5000 [IU] via SUBCUTANEOUS
  Filled 2012-10-14 (×17): qty 1

## 2012-10-14 MED ORDER — HYDROCORTISONE SOD SUCCINATE 100 MG IJ SOLR
5.0000 mg | Freq: Every evening | INTRAMUSCULAR | Status: DC
  Administered 2012-10-15 – 2012-10-17 (×4): 5 mg via INTRAVENOUS
  Filled 2012-10-14 (×5): qty 0.1

## 2012-10-14 MED ORDER — HYDROCORTISONE SOD SUCCINATE 100 MG IJ SOLR
10.0000 mg | Freq: Every day | INTRAMUSCULAR | Status: DC
  Administered 2012-10-15: 10 mg via INTRAVENOUS
  Administered 2012-10-16: 10:00:00 via INTRAVENOUS
  Administered 2012-10-17 – 2012-10-18 (×2): 10 mg via INTRAVENOUS
  Filled 2012-10-14 (×6): qty 0.2

## 2012-10-14 MED ORDER — IOHEXOL 300 MG/ML  SOLN
100.0000 mL | Freq: Once | INTRAMUSCULAR | Status: AC | PRN
  Administered 2012-10-14: 75 mL via INTRAVENOUS

## 2012-10-14 MED ORDER — SODIUM CHLORIDE 0.9 % IJ SOLN
3.0000 mL | Freq: Two times a day (BID) | INTRAMUSCULAR | Status: DC
Start: 2012-10-14 — End: 2012-10-19
  Administered 2012-10-14 – 2012-10-19 (×4): 3 mL via INTRAVENOUS

## 2012-10-14 MED ORDER — IOHEXOL 300 MG/ML  SOLN
25.0000 mL | INTRAMUSCULAR | Status: DC
  Administered 2012-10-14: 25 mL via ORAL

## 2012-10-14 MED ORDER — POTASSIUM CHLORIDE 10 MEQ/100ML IV SOLN
10.0000 meq | INTRAVENOUS | Status: AC
  Administered 2012-10-14 – 2012-10-15 (×4): 10 meq via INTRAVENOUS
  Filled 2012-10-14 (×4): qty 100

## 2012-10-14 NOTE — ED Notes (Signed)
Pt had large loose stool.  Pt cleaned and new diaper placed.  

## 2012-10-14 NOTE — Telephone Encounter (Signed)
Agree with ER.

## 2012-10-14 NOTE — Telephone Encounter (Signed)
[facility]N ccalls and states pt's family called and state that pt has diarrhea, fever, N&V, and very distended abd. Attempted to call family, home ph disconnected and no one answers at mobile, [facility] is called and she states she convinced family and pt to call 911 and pt should be in ED if not now very shortly. Will monitor pt record and call pt again if no ED visit

## 2012-10-14 NOTE — ED Notes (Signed)
Pt had large loose stool.  Pt cleaned and new diaper placed.

## 2012-10-14 NOTE — ED Notes (Signed)
Assisted cleaning patient with loose stool.

## 2012-10-14 NOTE — H&P (Signed)
Date: 10/14/2012               Patient Name:  Rachel Bridges MRN: [id number]  DOB: May 23, 1924 Age / Sex: 79 y.o., female   PCP: Rachel Mage, MD         Medical Service: Internal Medicine Teaching Service         Attending Physician: Dr. Dalphine Bridges    First Contact: Dr. Garald Bridges Pager: [phone]  Second Contact: Dr. Dorise Bridges Pager: [phone]       After Hours (After 5p/  First Contact Pager: [phone]  weekends / holidays): Second Contact Pager: [phone]   Chief Complaint: Abdominal distention, nausea, vomiting  History of Present Illness:  [name] is an 79 year-old woman with PMH significant for adrenal insufficiency (empty sella syndrome), monoclonal gammopathy, vitamin B12 deficiency, colon cancer (s/p hemicolectomy), and chronic colonic ileus who presents to [facility] ED on 10/14/12 with abdominal distention, nausea and minimal bilious, non-bloody vomiting.  She is accompanied by her daughter, Rachel Bridges.  Symptoms began at 2:30a on the morning prior to admission (suspect patient awoke due to power outage and feeling hot).  She had abdominal pain early this morning, which she cannot characterize, and has subsequently resolved.  Therapies tried without improvement of nausea include simethicone, zofran and coke.  Her daughter noticed pockets of bowel becoming visible this morning, that has since improved.  Patient reports regular BMs, with most recent BM after abdominal CT in ED.  Stool was loose, green, mucous appearing without blood.  She has history of chronic diarrhea due to gluten intolerance.  She reports subjective fever & chills, as well as indigestion.  She notes decreased appetite on day of admission, but normal appetite yesterday with good fluid intake.  She felt dizzy earlier this morning, and fell while moving from the bedroom to the living room while using her walker.  She did not lose consciousness or hit her head.  Fall was witnessed by her daughter and son in law.  Symptoms feels similar to  her last admission in May 2014.     Review of Systems: HEENT: Chronic vision disturbances due to history of macular degeneration, cataracts and artificial lens in the lef eye; chronically feels ears are stopped up due to wax; chronic rhinorrhea with eating  Respiratory: Denies SOB, DOE, chest tightness, and wheezing. Persistent dry cough since flu in May Cardiovascular: Denies chest pain, palpitations and leg swelling.  Gastrointestinal: per HPI Genitourinary: Denies dysuria, urgency, frequency, hematuria, flank pain and difficulty urinating.  Musculoskeletal: Denies new myalgias, back pain, joint swelling, arthralgias  Skin: Denies pallor, rash and wound.  Neurological: Denies seizures, syncope, numbness and headaches.    Meds: Medication Sig  . acetaminophen (TYLENOL) 325 MG tablet Take 325 mg by mouth daily as needed. For pain  . aspirin 81 MG tablet Take 81 mg by mouth daily.   . calcium citrate-vitamin D (CITRACAL+D) 315-200 MG-UNIT per tablet Take 1 tablet by mouth 2 (two) times daily.  . cyanocobalamin (,VITAMIN B-12,) 1000 MCG/ML injection Inject 1 mL (1,000 mcg total) into the muscle every 30 (thirty) days.  . feeding supplement (ENSURE COMPLETE) LIQD Take 237 mLs by mouth daily. Vanilla.  . furosemide (LASIX) 20 MG tablet Take 1 tablet (20 mg total) by mouth daily.  Marland Kitchen guaifenesin (ROBITUSSIN) 100 MG/5ML syrup Take 5 mLs (100 mg total) by mouth every 4 (four) hours as needed for congestion.  . hydrocortisone (CORTEF) 10 MG tablet Take 5-10 mg by mouth 2 (two) times daily.  10 mg in the a.m. And 5 mg in the afternoon.  . ondansetron (ZOFRAN ODT) 8 MG disintegrating tablet Take 1 tablet (8 mg total) by mouth every 8 (eight) hours as needed for nausea.  . paricalcitol (ZEMPLAR) 1 MCG capsule Take 1 mcg by mouth every Monday, Wednesday, and Friday.  . potassium chloride (K-DUR) 10 MEQ tablet Take 20 mEq by mouth 2 (two) times daily.  . sertraline (ZOLOFT) 50 MG tablet Take 1 tablet (50  mg total) by mouth daily.  . simethicone (MYLICON) 80 MG chewable tablet Chew 160 mg by mouth daily as needed. For flatulence    Allergies: Allergies as of 10/14/2012 - Review Complete 10/14/2012  Allergen Reaction Noted  . Oxycodone hcl Hallucinations 03/02/2011  . Morphine and related Delirium 03/02/2011  . Pantoprazole (pantoprazole sodium) Nausea Only 03/02/2011   Past Medical History  Diagnosis Date  . MGUS (monoclonal gammopathy of unknown significance)      [facility]  . Anxiety   . History of colon cancer      History of bowel  obstruction. on treatment with Colace and MiraLax.  followed by Dr. Loreta Bridges and Dr. Purnell Bridges  . Depression   . Diverticulosis of colon   . Osteopenia   . Lumbar spinal stenosis   . Atrophic vaginitis   . Renal cyst   . History of tobacco abuse     at least 64 pack year. Started about age 73 and continued until about age 21.  Marland Kitchen Dermatitis   . Hip pain      History  . Actinic keratosis   . Dementia   . Adrenal suppression     central, on chronic steroids, followed by Rachel Bridges of endocrinology  . Gluten intolerance     August 2011 endoscopy with biopsy negative for celiac disease. Prior to this patient had elevated TTG IgG of 93 (normal <20). Dr Rachel Bridges made clinical diagnosis of gluten intolerance based on diarrhea/symptoms from eating gluten. Needs GF diet.   Daughter tries to control diet.   Past Surgical History  Procedure Laterality Date  . Hemicolectomy       Right-sided  .  resection of ovarian cyst    . Cataract extraction       Left eye  . Knee arthroscopy       Left knee  . Appendectomy    . Carpal tunnel release    . Lumbar laminectomy    . Lumbar fusion    . Replacement total knee      left knee   History reviewed. No pertinent family history. History   Social History  . Marital Status: Widowed    Spouse Name: N/A    Number of Children: N/A  . Years of Education: N/A   Occupational  History  . Not on file.   Social History Main Topics  . Smoking status: Former Smoker -- 1.00 packs/day for 24 years    Types: Cigarettes    Quit date: 08/01/1988  . Smokeless tobacco: Not on file  . Alcohol Use: No  . Drug Use: No  . Sexually Active: No   Other Topics Concern  . Not on file   Social History Narrative    Husband died of myeloma.  Patient lives with daughter and son in [place], with [facility] services.    Physical Exam: Blood pressure 126/78, pulse 68, temperature 97.8 F (36.6 C), temperature source Oral, resp. rate 22, SpO2 94.00%. General:  resting in bed, no acute distress HEENT: EOMI, no scleral icterus, NGT placed with 700cc out, dry mucous membranes Cardiac: RRR, no rubs, murmurs or gallops Pulm: clear to auscultation bilaterally, moving normal volumes of air Abd: soft, nontender, +distended, BS hyperactive BS heard posteriorly but hypoactive BS heard anteriorly Ext: warm and well perfused, trace b/l LE pitting edema Neuro: alert and oriented X3, cranial nerves II-XII grossly intact  Lab results: Basic Metabolic Panel:  Recent Labs  16/10/96 1645 10/14/12 2014  NA 141  --   K 3.1*  --   CL 104  --   CO2 22  --   GLUCOSE 181*  --   BUN 22  --   CREATININE 1.24*  --   CALCIUM 8.0*  --   MG  --  1.5  GFR 38 Anion Gap: 15  Lactic Acid: 2.1 (0.5-2.2)  Liver Function Tests:  Recent Labs  10/14/12 1645  AST 12  ALT 9  ALKPHOS 127*  BILITOT 0.3  PROT 6.8  ALBUMIN 2.8*    Recent Labs  10/14/12 1645  LIPASE 24   CBC:  Recent Labs  10/14/12 1645  WBC 7.5  NEUTROABS 6.3  HGB 12.4  HCT 36.8  MCV 85.4  PLT 202    Imaging results:  Ct Abdomen Pelvis W Contrast 10/14/2012  Clinical Data: Abdominal distention, nausea, small bowel obstruction.   Findings: Lung bases are clear.  No pleural effusions.  Heart is normal size.  Continued gaseous distention of the proximal and mid colon which is decompressed beyond the distal  transverse colon. Worsening small bowel distention since prior study.  Distal small bowel is markedly dilated, with more proximal small bowel also now dilated.  It is unclear this is related to severe ileus/chronic dysmotility or could be related to colonic obstruction.  No obstructing process is visualized and again, the appearance of the colon is similar to prior study.  Liver, spleen, pancreas, adrenals are unremarkable.  Bilateral renal cysts.  No hydronephrosis.  Aorta and iliac vessels demonstrate scattered calcifications without aneurysm.  Postsurgical changes in the thoracic spine.  No acute bony abnormality.   IMPRESSION: Continued severe gaseous distention of the proximal and midportions of the colon, decompressed beyond the splenic flexure.  There is worsening dilatation of the small bowel since prior study. Multiple air-fluid levels.  It is unclear if the findings are related to severe dysmotility/ileus or possibly an obstruction, but no obstructing process visualized at the area of caliber change in the distal transverse colon.   Original Report Authenticated By: Charlett Nose, M.D.   Other results: EKG: No EKG at admission  Assessment & Plan by Problem: Ms. Shukla is a pleasant 79 year-old woman with PMH significant for adrenal insufficiency (empty sella syndrome), monoclonal gammopathy, colon cancer (s/p hemicolectomy), & history of chronic ileus admitted on 10/14/12 with abdominal distention and N/V.  Principal Problem:   Nausea & vomiting Active Problems:   DEPRESSION   CKD (chronic kidney disease) stage 2, GFR 60-89 ml/min   Adrenal insufficiency   Gluten intolerance   Severe malnutrition   Ileus   Hypokalemia   Chronic diarrhea  # Bowel ileus: This is a recurrent problem possibly related to celiac disease and noncompliance to gluten free diet. She s/p colectomy due to colon cancer. Adrenal insufficiency can cause these GI manifestations as well. Electrolytes abnormalities as the  etiology of symptoms excluded by the initial labs. She does not have symptoms/signs of infectious cause. She is currently hemodynamically stable.  CT scan bowel gaseous distention involving both colon and small bowel. She actually has diarrhea, which rules out complete obstruction. Lactic acid level is not elevated which makes bowel ischemia unlikely. MR angiography in 11/2011 neg for visceral arterial stenosis. Not on meds with potential for inducing ileus. TSH was normal 5/28.   Plan  - NGT tube with suctioning  - IVF with D5/1/2 NS at 100 cc/hr. Will monitor carefully given history of pulmonary edema.  - NPO - Zofran prn  - Hold oral meds for now  - serial abdominal exam  - pt denies current abdominal pain   # Hypokalemia: K level 3.1. Related to diarrhea. Magnesium low normal  Plan  - replete with 10 meq X4 - I.V magnesium 1g  - monitor with BMET  # Chronic Diarrhea: This again is likely related to gluten intolerance and poor compliance to gluten free diet, s/p colectomy. She does not have any features of infection to suspect C.diff or other infectious causes. Previous c.diff checks have been negative with most recently being 5/28. She denies recent antibiotic or PPI use. She was evaluated by Dr Rachel Bridges with endoscopy in 11/2009 which was negative. Prior she had elevated TTG IgG of 93. Gluten intolerance was based on clinical symptoms and since then she has been on GF free with poor compliance. Plan  - consider C.diff pcr if indicated (fevers or leukocytosis) - IVF as above   # Adrenal insuffiency: Dx in Nov 2012. Pt is followed by Dr Sharl Bridges of endocrinology. She takes Hydrocortisone 10 mg in a.m and 5 mg in p.m. She has no clinical suspicion for acute adrenal crisis. Plan  -Convert to IV with Solu-cortef 10 mg a.m and 5 mg p.m - consider reevaluation for adrenal insuffiency if her symptoms persist  #CKD stage 2: Stable creatine level. Will monitor in the setting of vomiting and diarrhea which  can easily result into prerenal acute renal injury.   #Depression: She takes Zoloft. Restart once able to take po  #Code: Limited code . Does not wish for intubation.   #DVT ppx: Heparin.   Dispo: Disposition is deferred at this time, awaiting improvement of current medical problems. Anticipated discharge in approximately 2-3 day(s).   The patient does have a current PCP (Rachel Mage, MD) and does need an [facility] hospital follow-up appointment after discharge.  The patient does not have transportation limitations that hinder transportation to clinic appointments.  Signed:  Dow Adolph, MD PGY-1 Internal Medicine Teaching Service Pager: [phone] (7pm-7am) 10/14/2012, 10:36 PM

## 2012-10-14 NOTE — ED Notes (Signed)
Report called to jessica, rn

## 2012-10-14 NOTE — ED Provider Notes (Signed)
History     CSN: [id number]  Arrival date & time 10/14/12  1605   First MD Initiated Contact with Patient 10/14/12 1633      Chief Complaint  Patient presents with  . Abdominal Pain     The history is provided by the patient.   patient is a nausea vomiting or the past 24 hours.  She did have a bowel movement earlier today.  She called her primary care physician and was told to come emergency apartment given her nausea vomiting and worsening abdominal distention today.  The patient has a history of small bowel obstructions.  She's been seen several times per the patient per the patient's family by general surgery who is deemed her to be a non-operable candidate given her age and comorbidities.  Patient states this occurs recurrently and she has had an NG tube before in the past.  She states this feels like her heart valve obstructions.  Her symptoms are mild to moderate in severity.  Nothing worsens or improves her symptoms.  She feels weak and has been unable to keep fluids down today because of the nausea and vomiting.  Past Medical History  Diagnosis Date  . MGUS (monoclonal gammopathy of unknown significance)      [facility]  . Anxiety   . History of colon cancer      History of bowel  obstruction. on treatment with Colace and MiraLax.  followed by Dr. Loreta Ave and Dr. Purnell Shoemaker  . Depression   . Diverticulosis of colon   . Osteopenia   . Lumbar spinal stenosis   . Atrophic vaginitis   . Renal cyst   . History of tobacco abuse     at least 64 pack year. Started about age 72 and continued until about age 23.  Marland Kitchen Dermatitis   . Hip pain      History  . Actinic keratosis   . Dementia   . Adrenal suppression     central, on chronic steroids, followed by Dr. Sharl Ma of endocrinology  . Gluten intolerance     August 2011 endoscopy with biopsy negative for celiac disease. Prior to this patient had elevated TTG IgG of 93 (normal <20). Dr Loreta Ave made  clinical diagnosis of gluten intolerance based on diarrhea/symptoms from eating gluten. Needs GF diet.     Past Surgical History  Procedure Laterality Date  . Hemicolectomy       Right-sided  .  resection of ovarian cyst    . Cataract extraction       Left eye  . Knee arthroscopy       Left knee  . Appendectomy    . Carpal tunnel release    . Lumbar laminectomy    . Lumbar fusion    . Replacement total knee      left knee    History reviewed. No pertinent family history.  History  Substance Use Topics  . Smoking status: Former Smoker -- 1.00 packs/day for 24 years    Types: Cigarettes    Quit date: 08/01/1988  . Smokeless tobacco: Not on file  . Alcohol Use: No    OB History   Grav Para Term Preterm Abortions TAB SAB Ect Mult Living                  Review of Systems  Gastrointestinal: Positive for abdominal pain.  All other systems reviewed and are negative.    Allergies  Oxycodone hcl;  Morphine and related; and Pantoprazole  Home Medications   Current Outpatient Rx  Name  Route  Sig  Dispense  Refill  . acetaminophen (TYLENOL) 325 MG tablet   Oral   Take 325 mg by mouth daily as needed. For pain         . aspirin 81 MG tablet   Oral   Take 81 mg by mouth daily.          . calcium citrate-vitamin D (CITRACAL+D) 315-200 MG-UNIT per tablet   Oral   Take 1 tablet by mouth 2 (two) times daily.   60 tablet   11   . cyanocobalamin (,VITAMIN B-12,) 1000 MCG/ML injection   Intramuscular   Inject 1 mL (1,000 mcg total) into the muscle every 30 (thirty) days.   1 mL   11   . feeding supplement (ENSURE COMPLETE) LIQD   Oral   Take 237 mLs by mouth daily. Vanilla.         . furosemide (LASIX) 20 MG tablet   Oral   Take 1 tablet (20 mg total) by mouth daily.   45 tablet   5     Take 1 extra tab(total 2) lasix dose for 5 days wh ...   . guaifenesin (ROBITUSSIN) 100 MG/5ML syrup   Oral   Take 5 mLs (100 mg total) by mouth every 4 (four)  hours as needed for congestion.   120 mL   0   . hydrocortisone (CORTEF) 10 MG tablet   Oral   Take 5-10 mg by mouth 2 (two) times daily. 10 mg in the a.m. And 5 mg in the afternoon.         . ondansetron (ZOFRAN ODT) 8 MG disintegrating tablet   Oral   Take 1 tablet (8 mg total) by mouth every 8 (eight) hours as needed for nausea.   10 tablet   0   . paricalcitol (ZEMPLAR) 1 MCG capsule   Oral   Take 1 mcg by mouth every Monday, Wednesday, and Friday.         . potassium chloride (K-DUR) 10 MEQ tablet   Oral   Take 20 mEq by mouth 2 (two) times daily.         . sertraline (ZOLOFT) 50 MG tablet   Oral   Take 1 tablet (50 mg total) by mouth daily.   30 tablet   5   . simethicone (MYLICON) 80 MG chewable tablet   Oral   Chew 160 mg by mouth daily as needed. For flatulence           BP 159/91  Pulse 84  Temp(Src) 97.9 F (36.6 C) (Oral)  Resp 16  SpO2 95%  Physical Exam  Nursing note and vitals reviewed. Constitutional: She is oriented to person, place, and time. She appears well-developed and well-nourished. No distress.  HENT:  Head: Normocephalic and atraumatic.  Eyes: EOM are normal.  Neck: Normal range of motion.  Cardiovascular: Normal rate, regular rhythm and normal heart sounds.   Pulmonary/Chest: Effort normal and breath sounds normal.  Abdominal: Soft. She exhibits distension. There is no tenderness. There is no rebound and no guarding.  Musculoskeletal: Normal range of motion.  Neurological: She is alert and oriented to person, place, and time.  Skin: Skin is warm and dry.  Psychiatric: She has a normal mood and affect. Judgment normal.    ED Course  Procedures (including critical care time)  Labs Reviewed  CBC WITH DIFFERENTIAL -  Abnormal; Notable for the following:    Neutrophils Relative % 85 (*)    All other components within normal limits  COMPREHENSIVE METABOLIC PANEL - Abnormal; Notable for the following:    Potassium 3.1 (*)     Glucose, Bld 181 (*)    Creatinine, Ser 1.24 (*)    Calcium 8.0 (*)    Albumin 2.8 (*)    Alkaline Phosphatase 127 (*)    GFR calc non Af Amer 38 (*)    GFR calc Af Amer 45 (*)    All other components within normal limits  LIPASE, BLOOD  URINALYSIS, ROUTINE W REFLEX MICROSCOPIC   Ct Abdomen Pelvis W Contrast  10/14/2012    see the*RADIOLOGY REPORT*  Clinical Data: Abdominal distention, nausea, small bowel obstruction.  CT ABDOMEN AND PELVIS WITH CONTRAST  Technique:  Multidetector CT imaging of the abdomen and pelvis was performed following the standard protocol during bolus administration of intravenous contrast.  Contrast: 75mL OMNIPAQUE IOHEXOL 300 MG/ML  SOLN  Comparison: 09/02/2012 CT.  Plain films 09/23/2012.  Findings: Lung bases are clear.  No pleural effusions.  Heart is normal size.  Continued gaseous distention of the proximal and mid colon which is decompressed beyond the distal transverse colon. Worsening small bowel distention since prior study.  Distal small bowel is markedly dilated, with more proximal small bowel also now dilated.  It is unclear this is related to severe ileus/chronic dysmotility or could be related to colonic obstruction.  No obstructing process is visualized and again, the appearance of the colon is similar to prior study.  Liver, spleen, pancreas, adrenals are unremarkable.  Bilateral renal cysts.  No hydronephrosis.  Aorta and iliac vessels demonstrate scattered calcifications without aneurysm.  Postsurgical changes in the thoracic spine.  No acute bony abnormality.  IMPRESSION: Continued severe gaseous distention of the proximal and midportions of the colon, decompressed beyond the splenic flexure.  There is worsening dilatation of the small bowel since prior study. Multiple air-fluid levels.  It is unclear if the findings are related to severe dysmotility/ileus or possibly an obstruction, but no obstructing process visualized at the area of caliber change in the  distal transverse colon.   Original Report Authenticated By: Charlett Nose, M.D.   I personally reviewed the imaging tests through PACS system I reviewed available ER/hospitalization records through the EMR   1. Abdominal pain       MDM  6:59 PM May represent developing small bowel obstruction.  NG tube placed.  Patient be admitted the hospital.  No peritonitis on exam.  Vital signs normal.  White blood cell count is normal.        Lyanne Co, MD 10/14/12 1901

## 2012-10-14 NOTE — ED Notes (Signed)
Per family pt sent here by MD for bowel obstruction. sts N,V. Hx of the same.

## 2012-10-15 DIAGNOSIS — IMO0002 Reserved for concepts with insufficient information to code with codable children: Secondary | ICD-10-CM | POA: Diagnosis present

## 2012-10-15 DIAGNOSIS — F0390 Unspecified dementia without behavioral disturbance: Secondary | ICD-10-CM | POA: Diagnosis present

## 2012-10-15 DIAGNOSIS — R109 Unspecified abdominal pain: Secondary | ICD-10-CM

## 2012-10-15 LAB — BASIC METABOLIC PANEL
BUN: 21 mg/dL (ref 6–23)
CO2: 22 mEq/L (ref 19–32)
Calcium: 8 mg/dL — ABNORMAL LOW (ref 8.4–10.5)
Chloride: 110 mEq/L (ref 96–112)
Creatinine, Ser: 1.11 mg/dL — ABNORMAL HIGH (ref 0.50–1.10)
Glucose, Bld: 130 mg/dL — ABNORMAL HIGH (ref 70–99)

## 2012-10-15 LAB — URINE MICROSCOPIC-ADD ON

## 2012-10-15 LAB — URINALYSIS, ROUTINE W REFLEX MICROSCOPIC
Glucose, UA: NEGATIVE mg/dL
Ketones, ur: NEGATIVE mg/dL
pH: 5.5 (ref 5.0–8.0)

## 2012-10-15 LAB — GLUCOSE, CAPILLARY
Glucose-Capillary: 120 mg/dL — ABNORMAL HIGH (ref 70–99)
Glucose-Capillary: 85 mg/dL (ref 70–99)

## 2012-10-15 LAB — PHOSPHORUS: Phosphorus: 3.1 mg/dL (ref 2.3–4.6)

## 2012-10-15 MED ORDER — DIPHENHYDRAMINE HCL 50 MG/ML IJ SOLN
12.5000 mg | Freq: Once | INTRAMUSCULAR | Status: AC
  Administered 2012-10-15: 12.5 mg via INTRAVENOUS
  Filled 2012-10-15: qty 1

## 2012-10-15 MED ORDER — POLYVINYL ALCOHOL 1.4 % OP SOLN
1.0000 [drp] | OPHTHALMIC | Status: DC | PRN
  Filled 2012-10-15: qty 15

## 2012-10-15 MED ORDER — DEXTROSE 5 % IV SOLN
1.0000 g | INTRAVENOUS | Status: DC
  Administered 2012-10-15: 1 g via INTRAVENOUS
  Filled 2012-10-15 (×2): qty 10

## 2012-10-15 NOTE — H&P (Signed)
Internal Medicine Teaching Service Attending Note Date: 10/15/2012  Patient name: Rachel Bridges  Medical record number: [id number]  Date of birth: 15-Feb-1925   79 year old female with adrenal insufficiency (empty sella syndrome), monoclonal gammopathy, gluten intolerance (clinically diagnosed, no tissue diagnosis) colon cancer s/p hemicolectomy and chronic colonic ileus, comes in with worsening distension, pain abdomen, nausea and vomiting with loose stools for one day.   Since admission the patient has had two more loose bowel movements, with no visible blood. She has had no vomiting. She has been drained of 700cc of fluid after NGT placement. She is a very poor historian, however she does say that she feels better since then.  Past history, social history reviewed.  Vitals reviewed.  Pertinent labs:   Recent Labs Lab 10/14/12 1645  HGB 12.4  HCT 36.8  WBC 7.5  PLT 202    Recent Labs Lab 10/14/12 1645 10/14/12 2014 10/15/12 0522  NA 141  --  142  K 3.1*  --  3.5  CL 104  --  110  CO2 22  --  22  GLUCOSE 181*  --  130*  BUN 22  --  21  CREATININE 1.24*  --  1.11*  CALCIUM 8.0*  --  8.0*  MG  --  1.5  --   PHOS  --  3.1  --     Recent Labs Lab 10/14/12 1645  AST 12  ALT 9  ALKPHOS 127*  BILITOT 0.3  PROT 6.8  ALBUMIN 2.8*     Imaging done: CT abdomen Pelvis. Reviewed. Large ileus. Multiple air-fluid levels. No intestinal obstruction seen.    Assessment and Plan   Exacerbation of chronic ileus, multifactorial etiology -  - UTI, per urinalysis - infection could be a cause to exacerbate ileus as well. We will treat with rocephin IV daily, and change to oral when the patient can tolerate oral therapy.   - Adrenal insufficiency can precipitate ileus. Continue home steroid dosing as IV.   - Supportive treatment for pain, nausea, vomiting, NGT, hydration, NPO until symptoms resolve.  - Monitor electrolytes. Avoid opiates.  - follow gluten diet  Diarrhea -  Likely related to gluten diet non-compliance (as per family, she get similar diarrhea when she eats non-gluten diet). We will check C.Diff. We will hydrate cautiously. Follow gluten diet.  Kidney Injury - Improving. Likely due to GI losses & dehydration, with poor po intake.  Rest per resident note.   Cela Newcom 10/15/2012, 1:13 PM.

## 2012-10-15 NOTE — Care Management Note (Signed)
    Page 1 of 2   10/19/2012     11:21:22 AM   CARE MANAGEMENT NOTE 10/19/2012  Patient:  JENNAFER, GLADUE   Account Number:  [id number]  Date Initiated:  10/15/2012  Documentation initiated by:  GRAVES-BIGELOW,Zackary Mckeone  Subjective/Objective Assessment:   Pt admitted with Abdominal distention, nausea, vomiting. Pt is from home with daughtr and son-n-law.     Action/Plan:   CM did speak to MD and daughter in reference to [facility] services that pt has. Pt is active with [facility] services for RN,PT,OT Aide. Family to meet today around 1:30 - 2 pm to discuss plan of care for pt. DME Hospital Bed.   Anticipated DC Date:  10/17/2012   Anticipated DC Plan:  HOME W HOME HEALTH SERVICES      DC Planning Services  CM consult      [facility] Choice  Resumption Of Svcs/PTA Provider   Choice offered to / List presented to:  C-4 Adult Children        HH arranged  HH-1 RN  HH-10 DISEASE MANAGEMENT  HH-2 PT  HH-3 OT  HH-4 NURSE'S AIDE      HH agency  [employer]   Status of service:  Completed, signed off Medicare Important Message given?   (If response is "NO", the following Medicare IM given date fields will be blank) Date Medicare IM given:   Date Additional Medicare IM given:    Discharge Disposition:  HOME W HOME HEALTH SERVICES  Per UR Regulation:  Reviewed for med. necessity/level of care/duration of stay  If discussed at Long Length of Stay Meetings, dates discussed:    Comments:  10-19-12 [address] [place], [place] [phone] CM did make referral with [facility] services. Pt to d/c today and the plan is to resume [facility] services. No further needs from CM at this time.

## 2012-10-15 NOTE — Progress Notes (Signed)
INTERNAL MEDICINE TEACHING SERVICE Night Float Progress Note   Subjective:    We were called overnight by the RN for evaluation of sore throat and appeared a little anxious, and irritation in her eyes. She also requests for something for sleep. She  At time of evaluation, pt was complaining of sore throat likely from NGT. She reports to be have chills but no fevers. Her daughter is not sure whether her eyes are infected bilaterally. She uses Azithromycin eye drop at home from time to time.    Objective:    BP 102/62  Pulse 101  Temp(Src) 98.1 F (36.7 C) (Oral)  Resp 18  Ht 5\' 7"  (1.702 m)  Wt 119 lb 14.4 oz (54.386 kg)  BMI 18.77 kg/m2  SpO2 93%   Labs: Basic Metabolic Panel:    Component Value Date/Time   NA 142 10/15/2012 0522   K 3.5 10/15/2012 0522   CL 110 10/15/2012 0522   CO2 22 10/15/2012 0522   BUN 21 10/15/2012 0522   CREATININE 1.11* 10/15/2012 0522   CREATININE 0.96 01/03/2012 1426   GLUCOSE 130* 10/15/2012 0522   CALCIUM 8.0* 10/15/2012 0522    CBC:    Component Value Date/Time   WBC 7.5 10/14/2012 1645   WBC 6.6 02/07/2012 1224   HGB 12.4 10/14/2012 1645   HGB 13.0 02/07/2012 1224   HCT 36.8 10/14/2012 1645   HCT 39.3 02/07/2012 1224   PLT 202 10/14/2012 1645   PLT 225 02/07/2012 1224   MCV 85.4 10/14/2012 1645   MCV 88.6 02/07/2012 1224   NEUTROABS 6.3 10/14/2012 1645   NEUTROABS 4.6 02/07/2012 1224   LYMPHSABS 0.9 10/14/2012 1645   LYMPHSABS 1.5 02/07/2012 1224   MONOABS 0.2 10/14/2012 1645   MONOABS 0.5 02/07/2012 1224   EOSABS 0.0 10/14/2012 1645   EOSABS 0.1 02/07/2012 1224   BASOSABS 0.0 10/14/2012 1645   BASOSABS 0.0 02/07/2012 1224    Cardiac Enzymes: Lab Results  Component Value Date   CKTOTAL 32 05/02/2011   CKMB 2.8 05/02/2011   TROPONINI <0.30 05/02/2011    Physical Exam: Filed Vitals:   10/15/12 1400  BP: 102/62  Pulse: 101  Temp: 98.1 F (36.7 C)  Resp: 18    General: Vital signs reviewed and noted. Well-developed, well-nourished, in no  acute distress; alert, appropriate and cooperative throughout examination. Daughter at bedside  Eyes: appear teary but no conjuctival injection. No pus discharge.   Lungs:  Normal respiratory effort. Clear to auscultation BL without crackles or wheezes.  Heart: RRR. S1 and S2 normal without gallop, murmur, or rubs.  Abdomen:  BS normoactive. Soft, Nondistended, non-tender.  No masses or organomegaly. NGT still draining.   Extremities: No pretibial edema.     Assessment/ Plan:    #Bilateral eyes irritation: Eye unlikely infected. Will order artificial tears. Evaluate tomorrow #Sore throat: likely due to NGT. Ordered Benadryl 12.5 mg once to help with sleep and pain. She uses Zoloft at home and this can be restarted once she is oral.  Dow Adolph, MD  10/15/2012, 9:36 PM

## 2012-10-15 NOTE — Evaluation (Signed)
Physical Therapy Evaluation Patient Details Name: Rachel Bridges MRN: [id number] DOB: 1924-07-11 Today's Date: 10/15/2012 Time: 4098-1191 PT Time Calculation (min): 29 min  PT Assessment / Plan / Recommendation Clinical Impression    Pt is an [name]  admitted with Abdominal distention, nausea, vomiting. Pt is from home with daughtr and son-n-law. Pt demonstrates deficits in functional mobility at this time and will benefit from continued skilled PT to address deficits as indicated below.  Pt will need 24/7 hands on assist for OOB at this time. If family cannot provide rec SNF.       PT Assessment  Patient needs continued PT services    Follow Up Recommendations  SNF;Supervision/Assistance - 24 hour (will benefit from SNF, otherwise needs 24 hr hands on assist)    Does the patient have the potential to tolerate intense rehabilitation      Barriers to Discharge None      Equipment Recommendations  None recommended by PT    Recommendations for Other Services     Frequency Min 3X/week    Precautions / Restrictions Precautions Precautions: Fall Restrictions Weight Bearing Restrictions: No   Pertinent Vitals/Pain No pain indicated at this time      Mobility  Bed Mobility Bed Mobility: Supine to Sit;Sitting - Scoot to Edge of Bed Supine to Sit: 4: Min assist;HOB elevated;With rails Sitting - Scoot to Edge of Bed: 4: Min assist Details for Bed Mobility Assistance: VCs for hand placement and positioning Transfers Transfers: Sit to Stand;Stand to Sit Sit to Stand: 4: Min assist;With upper extremity assist Stand to Sit: 4: Min assist;With upper extremity assist;With armrests;To chair/3-in-1 Details for Transfer Assistance: VCs for safe hand placement Ambulation/Gait Ambulation/Gait Assistance: 4: Min assist Ambulation Distance (Feet): 60 Feet Assistive device: Rolling walker Ambulation/Gait Assistance Details: PT responded well to Verbal and manual pacing. Cues  for increased stride. Pt with some instability Gait Pattern: Step-to pattern;Decreased hip/knee flexion - left;Decreased hip/knee flexion - right;Decreased stride length;Festinating Gait velocity: significantly decreased General Gait Details: Unsteady with ambulation requires hands on assist Stairs: No Wheelchair Mobility Wheelchair Mobility: No        PT Diagnosis: Difficulty walking;Generalized weakness;Acute pain  PT Problem List: Decreased strength;Decreased activity tolerance;Decreased balance;Decreased mobility;Decreased knowledge of use of DME PT Treatment Interventions: DME instruction;Gait training;Functional mobility training;Therapeutic activities;Stair training;Therapeutic exercise;Balance training   PT Goals Acute Rehab PT Goals PT Goal Formulation: With patient Time For Goal Achievement: 10/15/12 Potential to Achieve Goals: Good Pt will go Supine/Side to Sit: with modified independence PT Goal: Supine/Side to Sit - Progress: Goal set today Pt will go Sit to Supine/Side: with supervision PT Goal: Sit to Supine/Side - Progress: Goal set today Pt will go Stand to Sit: with supervision PT Goal: Stand to Sit - Progress: Goal set today Pt will Transfer Bed to Chair/Chair to Bed: with supervision PT Transfer Goal: Bed to Chair/Chair to Bed - Progress: Goal set today Pt will Ambulate: 16 - 50 feet;with min assist;with least restrictive assistive device PT Goal: Ambulate - Progress: Goal set today  Visit Information  Last PT Received On: 10/15/12 Assistance Needed: +1    Subjective Data  Subjective: agree to PT eval.   Prior Functioning  Home Living Lives With: Family Available Help at Discharge: Family;Available 24 hours/day Type of Home: House Home Access: Stairs to enter [employer] of Steps: 3 Entrance Stairs-Rails: Right Home Layout: One level Bathroom Shower/Tub: Forensic scientist: Standard Home Adaptive Equipment: Walker -  rolling;Straight cane;Shower chair  with back;Bedside commode/3-in-1;Hand-held shower hose Prior Function Level of Independence: Needs assistance Able to Take Stairs?: Yes Driving: No Vocation: Retired Musician: No difficulties Dominant Hand: Right    Cognition  Cognition Arousal/Alertness: Awake/alert Behavior During Therapy: WFL for tasks assessed/performed Overall Cognitive Status: History of cognitive impairments - at baseline    Extremity/Trunk Assessment Right Upper Extremity Assessment RUE ROM/Strength/Tone: Within functional levels Left Upper Extremity Assessment LUE ROM/Strength/Tone: Within functional levels Right Lower Extremity Assessment RLE ROM/Strength/Tone: Deficits RLE ROM/Strength/Tone Deficits: gross LE strength 4/5  Left Lower Extremity Assessment LLE ROM/Strength/Tone: Deficits LLE ROM/Strength/Tone Deficits: Gross LE strength 4/5   Balance Balance Balance Assessed: Yes Static Sitting Balance Static Sitting - Balance Support: Feet supported Static Sitting - Level of Assistance: 7: Independent Static Sitting - Comment/# of Minutes: 7 minutes on bedside commode; 3 mins EOB  End of Session PT - End of Session Equipment Utilized During Treatment: Gait belt Activity Tolerance: Patient limited by pain;Patient limited by fatigue Patient left: in chair;with call bell/phone within reach Nurse Communication: Mobility status  GP     Fabio Asa 10/15/2012, 12:48 PM Charlotte Crumb, PT DPT  [phone]

## 2012-10-15 NOTE — Progress Notes (Signed)
Subjective: She pulled the NG tube off this morning stating that she was "tired of that tube". She denies nausea, vomiting, or abdominal pain. Her diarrhea is improving, she reports it as less frequent. She has urinary frequency but denies dysuria.    Objective: Vital signs in last 24 hours: Filed Vitals:   10/14/12 2231 10/15/12 0449 10/15/12 0642 10/15/12 1400  BP: 128/74  122/71 102/62  Pulse: 72  65 101  Temp:   98.1 F (36.7 C) 98.1 F (36.7 C)  TempSrc:   Oral Oral  Resp: 18  18 18   Height:      Weight:  119 lb 14.4 oz (54.386 kg)    SpO2: 96%  94% 93%   Weight change:   Intake/Output Summary (Last 24 hours) at 10/15/12 1602 Last data filed at 10/15/12 0800  Gross per 24 hour  Intake      3 ml  Output   1350 ml  Net  -1347 ml   Vitals reviewed. General: Sitting up in chair, in NAD HEENT: no scleral icterus Cardiac: RRR, no rubs, murmurs or gallops, distant heart sounds Pulm: clear to auscultation bilaterally, no wheezes, rales, or rhonchi Abd: soft, nontender, distended, hypoactive BS present Ext: warm and well perfused, no pedal edema Neuro: alert and oriented X1 (to place but not to time or person), cranial nerves II-XII grossly intact, strength and sensation to light touch equal in bilateral upper and lower extremities  Lab Results: Basic Metabolic Panel:  Recent Labs Lab 10/14/12 1645 10/14/12 2014 10/15/12 0522  NA 141  --  142  K 3.1*  --  3.5  CL 104  --  110  CO2 22  --  22  GLUCOSE 181*  --  130*  BUN 22  --  21  CREATININE 1.24*  --  1.11*  CALCIUM 8.0*  --  8.0*  MG  --  1.5  --   PHOS  --  3.1  --    Liver Function Tests:  Recent Labs Lab 10/14/12 1645  AST 12  ALT 9  ALKPHOS 127*  BILITOT 0.3  PROT 6.8  ALBUMIN 2.8*    Recent Labs Lab 10/14/12 1645  LIPASE 24   CBC:  Recent Labs Lab 10/14/12 1645  WBC 7.5  NEUTROABS 6.3  HGB 12.4  HCT 36.8  MCV 85.4  PLT 202   CBG:  Recent Labs Lab 10/15/12 0035  10/15/12 0444 10/15/12 1115  GLUCAP 119* 120* 85     Urinalysis:  Recent Labs Lab 10/15/12 0324  COLORURINE YELLOW  LABSPEC 1.042*  PHURINE 5.5  GLUCOSEU NEGATIVE  HGBUR TRACE*  BILIRUBINUR NEGATIVE  KETONESUR NEGATIVE  PROTEINUR NEGATIVE  UROBILINOGEN 0.2  NITRITE NEGATIVE  LEUKOCYTESUR MODERATE*   Micro Results: No results found for this or any previous visit (from the past 240 hour(s)). Studies/Results: Ct Abdomen Pelvis W Contrast  10/14/2012    see the*RADIOLOGY REPORT*  Clinical Data: Abdominal distention, nausea, small bowel obstruction.  CT ABDOMEN AND PELVIS WITH CONTRAST  Technique:  Multidetector CT imaging of the abdomen and pelvis was performed following the standard protocol during bolus administration of intravenous contrast.  Contrast: 75mL OMNIPAQUE IOHEXOL 300 MG/ML  SOLN  Comparison: 09/02/2012 CT.  Plain films 09/23/2012.  Findings: Lung bases are clear.  No pleural effusions.  Heart is normal size.  Continued gaseous distention of the proximal and mid colon which is decompressed beyond the distal transverse colon. Worsening small bowel distention since prior study.  Distal small  bowel is markedly dilated, with more proximal small bowel also now dilated.  It is unclear this is related to severe ileus/chronic dysmotility or could be related to colonic obstruction.  No obstructing process is visualized and again, the appearance of the colon is similar to prior study.  Liver, spleen, pancreas, adrenals are unremarkable.  Bilateral renal cysts.  No hydronephrosis.  Aorta and iliac vessels demonstrate scattered calcifications without aneurysm.  Postsurgical changes in the thoracic spine.  No acute bony abnormality.  IMPRESSION: Continued severe gaseous distention of the proximal and midportions of the colon, decompressed beyond the splenic flexure.  There is worsening dilatation of the small bowel since prior study. Multiple air-fluid levels.  It is unclear if the findings  are related to severe dysmotility/ileus or possibly an obstruction, but no obstructing process visualized at the area of caliber change in the distal transverse colon.   Original Report Authenticated By: Charlett Nose, M.D.   Medications: I have reviewed the patient's current medications. Scheduled Meds: . cefTRIAXone (ROCEPHIN) IVPB 1 gram/50 mL D5W  1 g Intravenous Q24H  . heparin  5,000 Units Subcutaneous Q8H  . hydrocortisone sod succinate (SOLU-CORTEF) inj  10 mg Intravenous Q breakfast   And  . hydrocortisone sod succinate (SOLU-CORTEF) inj  5 mg Intravenous QPM  . sodium chloride  3 mL Intravenous Q12H   Continuous Infusions: . dextrose 5 % and 0.45% NaCl 100 mL/hr at 10/15/12 1216   PRN Meds:.ondansetron (ZOFRAN) IV Assessment/Plan:  Bowel ileus: This is a recurrent problem likely related to celiac disease and noncompliance to gluten free diet. She  does have UTI that could have contributed to her ileus. CT scan bowel gaseous distention involving both colon and small bowel. She had large BM with diarrhea today. She remains hemodynamically stable. She removed her NG tube today stating that she was "tired" and wanted to eat. Brief goals of care meeting with her two daughters, [name] and [name] today with more focus on comfort. Ms. Kelch agrees to NG tube reinsertion but requests sips of water/ice while with the "tube".  Plan  - Reinsert NGT tube with suctioning  - IVF with D5/1/2 NS at 75 cc/hr. Will monitor carefully given history of pulmonary edema.  - NPO with sips of water, juice/Italian ice - Zofran prn  - Hold oral meds for now  - serial abdominal exam  - pt denies current abdominal pain  - Palliative care consult for symptoms management and planning options  - Spiritual/Pastoral care for assistance with HPOA forms  UTI. UA with pyuria. Her daughter reports increased urinary frequency. This could be a contributing factor to her ileus.  -Rocephin IV started today  Hypokalemia:  Resolved with repletion. K of 3.5 today was 3/1 on admission. K- replete with 10 meq X4  - monitor with BMET   Chronic Diarrhea: Likely related to gluten intolerance and poor compliance to gluten free diet, s/p partial colectomy secondary to colon cancer (in presumed remission). She denies recent antibiotic or PPI use with C.diff negative PCR recently. She was evaluated by Dr Loreta Ave with endoscopy in 11/2009 which was negative. Prior she had elevated TTG IgG of 93. Gluten intolerance was based on clinical symptoms and since then she has been on GF free with poor compliance as the patient loves to eat bread and candy.  - C.diff PCR ordered - IVF as above   Adrenal insuffiency: Dx in Nov 2012. Pt is followed by Dr Sharl Ma of endocrinology. She takes Hydrocortisone 10 mg in  a.m and 5 mg in p.m. She has no clinical suspicion for acute adrenal crisis.  - Continue IV with Solu-cortef 10 mg a.m and 5 mg p.m  - consider reevaluation for adrenal insuffiency if her symptoms persist   CKD stage 2: Stable creatine level, slightly trended down today.   Depression: She takes Zoloft. Restart once able to take medications per mouth.   Dementia. Moderate to severe Alzheimer's with steady decline per her daughter/caregiver.   Failure to thrive.  Recent weight loss and albumin of 2.8, likely secondary to progressive dementia, chronic diarrhea.    Code Status: Brief goals of care meeting today with her and her two caregivers daughters, [name] (pursuing HPOA), and [place] with decision for DNR/DNI.   DVT ppx: Heparin.   Dispo: Disposition is deferred at this time, awaiting improvement of current medical problems.  Anticipated discharge in approximately 2-3 day(s).    The patient does have a current PCP (Lars Mage, MD) and does need an [facility] hospital follow-up appointment after discharge.  The patient does not have transportation limitations that hinder transportation to clinic appointments.  .Services Needed at time  of discharge: Y = Yes, Blank = No PT:   OT:   RN:   Equipment:   Other:     LOS: 1 day   Ky Barban, MD 10/15/2012, 4:02 PM

## 2012-10-15 NOTE — Progress Notes (Signed)
Thank you for consulting the Palliative Medicine Team at [facility] to meet your patient's and family's needs.   The reason that you asked [place] to see your patient is  For Goals of Care   We have scheduled your patient for a meeting: 10/16/12 at 11:30a  The Surrogate decision make is: daughter, [name]  Contact [phone]  Other family members that need to be present:  Daughter, [name]  Your patient is able/unable to participate: confused, with history of dementia  Additional Narrative:   Patient is an 79 yo WF, with PMH of Adrenal Insufficiency, Monoclonal Gammopathy, CRC (s/p hemicolectomy) and chronic ileus. Admitted to ER from home 10/14/12 with abdominal distention, and nausea/vomiting. Being treated for bowel ileus and hypokalemia  Rachel Bridges, CNS-C Palliative Medicine Team [facility] Team Phone: [phone] Pager: [phone]

## 2012-10-16 DIAGNOSIS — Z515 Encounter for palliative care: Secondary | ICD-10-CM

## 2012-10-16 LAB — GLUCOSE, CAPILLARY
Glucose-Capillary: 109 mg/dL — ABNORMAL HIGH (ref 70–99)
Glucose-Capillary: 66 mg/dL — ABNORMAL LOW (ref 70–99)
Glucose-Capillary: 70 mg/dL (ref 70–99)
Glucose-Capillary: 95 mg/dL (ref 70–99)

## 2012-10-16 LAB — COMPREHENSIVE METABOLIC PANEL
AST: 12 U/L (ref 0–37)
Albumin: 2.2 g/dL — ABNORMAL LOW (ref 3.5–5.2)
BUN: 19 mg/dL (ref 6–23)
Calcium: 8.2 mg/dL — ABNORMAL LOW (ref 8.4–10.5)
Chloride: 112 mEq/L (ref 96–112)
Creatinine, Ser: 1.24 mg/dL — ABNORMAL HIGH (ref 0.50–1.10)
Total Bilirubin: 0.2 mg/dL — ABNORMAL LOW (ref 0.3–1.2)
Total Protein: 5.5 g/dL — ABNORMAL LOW (ref 6.0–8.3)

## 2012-10-16 LAB — CBC
HCT: 32.2 % — ABNORMAL LOW (ref 36.0–46.0)
Hemoglobin: 10.6 g/dL — ABNORMAL LOW (ref 12.0–15.0)
MCH: 28.4 pg (ref 26.0–34.0)
MCV: 86.3 fL (ref 78.0–100.0)
RBC: 3.73 MIL/uL — ABNORMAL LOW (ref 3.87–5.11)
WBC: 5.8 10*3/uL (ref 4.0–10.5)

## 2012-10-16 MED ORDER — DEXTROSE 5 % IV SOLN
1.0000 g | INTRAVENOUS | Status: DC
  Administered 2012-10-17 – 2012-10-18 (×2): 1 g via INTRAVENOUS
  Filled 2012-10-16 (×3): qty 10

## 2012-10-16 MED ORDER — POTASSIUM CHLORIDE 2 MEQ/ML IV SOLN
INTRAVENOUS | Status: DC
  Administered 2012-10-16: 22:00:00 via INTRAVENOUS
  Filled 2012-10-16 (×4): qty 1000

## 2012-10-16 MED ORDER — POTASSIUM CHLORIDE 10 MEQ/100ML IV SOLN
10.0000 meq | INTRAVENOUS | Status: DC
  Administered 2012-10-16 (×4): 10 meq via INTRAVENOUS
  Filled 2012-10-16 (×6): qty 100

## 2012-10-16 MED ORDER — POTASSIUM CHLORIDE 10 MEQ/100ML IV SOLN
10.0000 meq | INTRAVENOUS | Status: DC
  Administered 2012-10-16 (×2): 10 meq via INTRAVENOUS
  Filled 2012-10-16 (×2): qty 100

## 2012-10-16 NOTE — Consult Note (Signed)
Patient Rachel Bridges      DOB: 16-Oct-1924      NWG:[id number]     Consult Note from the Palliative Medicine Team at [facility]    Consult Requested by: Dr Garald Braver     PCP: Lars Mage, MD Reason for Consultation:Goals of Care    Phone [phone]  Assessment of patients Current state: Patient very soft-spoken, but alert, oriented, able to respond appropriately to question. N/G in R nare, d/w clear brownish colored gastric fluid, patient taking small sips/ice chips orally.  Reviewed chart, spoke with staff and proceeded to have Goals of Care meeting with patient, and her daughters, [name] and Radene Journey. Prior to this admission Per daughters patient was able to ambulate with walker 40 foot, activity consist mainly of sitting in chair while awake, appetite was good, she was continent of bowel/bladder. Had home health services from from [place] several times per week in place prior to admission.   Discussed the philosophy of palliative care and hospice support and services provided. Also engaged in decision-making with patient and  family regarding concepts of Medical Orders for Scope of Treatment pertaining to cardiac and pulmonary resuscitation, desire for acute future medical interventions for issues, the use of antibiotic therapy, intravenous hydration and continuing artificial tube feedings. A MOST form was completed and placed on chart with DNR/DNI form.  At this time daughters would like patient brought to hospital for any acute medical issues that require evaluation and treatment. Daughters are aware of potential for decline and understand that future decline may warrant transition to full comfort approach with hospice support.  Goals of Care: 1.  Code Status: DNR/DNI   Desire to continue limited aggressive medical management for acute medical issues  2. Scope of Treatment: 1. Vital Signs: routine 2. Respiratory/Oxygen: support as indicated 3. Nutritional  Support/Tube Feeds: No artificial means of nutritional support 4. Antibiotics: use as indicated for identified infections 5. Blood Products: as indicated 6. IVF: for defined period of time 7. Review of Medications to be discontinued: none 8. Labs/CBG monitoring: as indicated 9. Telemetry: as indicated 10. Consults: Spiritual consult for HPOA and advanced directives  4. Disposition: Plan at this time is to return home with home health services, daughters are adamant they do not want mother placed in SNF.   3. Symptom Management:  No symptoms identified that require intervention. Daughters emphasized patient cannot take stronger opiates such as Morphine or Oxycodone due to sever mental status changes. She is able to take Vicodin for pain. (Patient denies pain does not require pain medication this admission)   4. Psychosocial: Patient is cared for in her home by daughter, [name] and her husband Harvie Heck. Patient has been cared for by daughter for past 10 years. Family is very close knit and supportive of patient, patient also has 4 son's. Patients husband passed away in 09-07-2005.   5. Spiritual: chaplin involved in patients care, assisting family with obtaining HPOA agent and also filling out Advanced Directive document.   Patient Documents Completed or Given: Document Given Completed  Advanced Directives Pkt    MOST  YES  DNR  YES  Gone from My Sight    Hard Choices      Brief HPI: Patient is an 79 yo WF, with PMH of Adrenal Insufficiency, Monoclonal Gammopathy, CRC (s/p hemicolectomy) and chronic ileus.  Admitted to ER from home 10/14/12 with abdominal distention, and nausea/vomiting. Being treated for bowel ileus and hypokalemia    ROS: + dry mouth,  denies nausea, pain, diarrhea, anxiety    PMH:  Past Medical History  Diagnosis Date  . MGUS (monoclonal gammopathy of unknown significance)      [facility]  . Anxiety   . Depression   .  Diverticulosis of colon   . Osteopenia   . Lumbar spinal stenosis   . Atrophic vaginitis   . History of tobacco abuse     at least 64 pack year. Started about age 79 and continued until about age 25.  Marland Kitchen Dermatitis   . Hip pain      History  . Actinic keratosis   . Dementia   . Adrenal suppression     central, on chronic steroids, followed by Dr. Sharl Ma of endocrinology  . Gluten intolerance     August 2011 endoscopy with biopsy negative for celiac disease. Prior to this patient had elevated TTG IgG of 93 (normal <20). Dr Loreta Ave made clinical diagnosis of gluten intolerance based on diarrhea/symptoms from eating gluten. Needs GF diet.   Daughter tries to control diet.  . Colon cancer     History of bowel  obstruction. on treatment with Colace and MiraLax.  followed by Dr. Loreta Ave and Dr. Purnell Shoemaker  . Shortness of breath     "sometimes; can happen at any time" (10/14/2012)  . Arthritis     "probably" (10/14/2012)  . Renal cyst   . CKD (chronic kidney disease), stage II     /notes 10/14/2012     PSH: Past Surgical History  Procedure Laterality Date  . Hemicolectomy       Right-sided  . Ovarian cyst removal    . Cataract extraction w/ intraocular lens  implant, bilateral Bilateral   . Knee arthroscopy Left   . Appendectomy    . Carpal tunnel release Left   . Lumbar laminectomy    . Lumbar fusion    . Replacement total knee Left   . Tonsillectomy  1930's   I have reviewed the FH and SH and  If appropriate update it with new information. Allergies  Allergen Reactions  . Oxycodone Hcl Other (See Comments)    Hallucinations   . Morphine And Related Other (See Comments)    delirium  . Pantoprazole (Pantoprazole Sodium) Nausea Only   Scheduled Meds: . cefTRIAXone (ROCEPHIN) IVPB 1 gram/50 mL D5W  1 g Intravenous Q24H  . heparin  5,000 Units Subcutaneous Q8H  . hydrocortisone sod succinate (SOLU-CORTEF) inj  10 mg Intravenous Q breakfast   And  . hydrocortisone sod succinate  (SOLU-CORTEF) inj  5 mg Intravenous QPM  . potassium chloride  10 mEq Intravenous Q1 Hr x 6  . sodium chloride  3 mL Intravenous Q12H   Continuous Infusions: . dextrose 5 % and 0.45% NaCl 100 mL/hr at 10/15/12 1216   PRN Meds:.ondansetron (ZOFRAN) IV, polyvinyl alcohol    BP 111/65  Pulse 68  Temp(Src) 98.9 F (37.2 C) (Oral)  Resp 18  Ht 5\' 7"  (1.702 m)  Wt 54.568 kg (120 lb 4.8 oz)  BMI 18.84 kg/m2  SpO2 96%   PPS:30% (currently NPO due to ileus)   Intake/Output Summary (Last 24 hours) at 10/16/12 1415 Last data filed at 10/16/12 0600  Gross per 24 hour  Intake 2166.67 ml  Output    500 ml  Net 1666.67 ml   LBM: 10/15/12  Physical Exam:  General: Alert, verbally responsive HEENT:  Buccal mucosa and lips dry, small circular ecchymosed area on tip of tongue  Chest:  CTA bilaterally CVS: RRR Abdomen:distended, hypoactive BS, non-tender upon light palpation Ext: trace pedal edema bilaterally Neuro: oriented x 3  Labs: CBC    Component Value Date/Time   WBC 5.8 10/16/2012 0510   WBC 6.6 02/07/2012 1224   RBC 3.73* 10/16/2012 0510   RBC 4.44 02/07/2012 1224   HGB 10.6* 10/16/2012 0510   HGB 13.0 02/07/2012 1224   HCT 32.2* 10/16/2012 0510   HCT 39.3 02/07/2012 1224   PLT 188 10/16/2012 0510   PLT 225 02/07/2012 1224   MCV 86.3 10/16/2012 0510   MCV 88.6 02/07/2012 1224   MCH 28.4 10/16/2012 0510   MCH 29.4 02/07/2012 1224   MCHC 32.9 10/16/2012 0510   MCHC 33.1 02/07/2012 1224   RDW 15.8* 10/16/2012 0510   RDW 15.3* 02/07/2012 1224   LYMPHSABS 0.9 10/14/2012 1645   LYMPHSABS 1.5 02/07/2012 1224   MONOABS 0.2 10/14/2012 1645   MONOABS 0.5 02/07/2012 1224   EOSABS 0.0 10/14/2012 1645   EOSABS 0.1 02/07/2012 1224   BASOSABS 0.0 10/14/2012 1645   BASOSABS 0.0 02/07/2012 1224    BMET    Component Value Date/Time   NA 142 10/16/2012 0510   K 2.8* 10/16/2012 0510   CL 112 10/16/2012 0510   CO2 21 10/16/2012 0510   GLUCOSE 98 10/16/2012 0510    BUN 19 10/16/2012 0510   CREATININE 1.24* 10/16/2012 0510   CREATININE 0.96 01/03/2012 1426   CALCIUM 8.2* 10/16/2012 0510   GFRNONAA 38* 10/16/2012 0510   GFRAA 45* 10/16/2012 0510    CMP     Component Value Date/Time   NA 142 10/16/2012 0510   K 2.8* 10/16/2012 0510   CL 112 10/16/2012 0510   CO2 21 10/16/2012 0510   GLUCOSE 98 10/16/2012 0510   BUN 19 10/16/2012 0510   CREATININE 1.24* 10/16/2012 0510   CREATININE 0.96 01/03/2012 1426   CALCIUM 8.2* 10/16/2012 0510   PROT 5.5* 10/16/2012 0510   ALBUMIN 2.2* 10/16/2012 0510   AST 12 10/16/2012 0510   ALT 8 10/16/2012 0510   ALKPHOS 86 10/16/2012 0510   BILITOT 0.2* 10/16/2012 0510   GFRNONAA 38* 10/16/2012 0510   GFRAA 45* 10/16/2012 0510    CT Abdomen/Pelvis of the Head Reviewed/Impressions:10/14/12 IMPRESSION:  Continued severe gaseous distention of the proximal and midportions  of the colon, decompressed beyond the splenic flexure. There is  worsening dilatation of the small bowel since prior study.  Multiple air-fluid levels. It is unclear if the findings are  related to severe dysmotility/ileus or possibly an obstruction, but  no obstructing process visualized at the area of caliber change in  the distal transverse colon.  Time In Time Out Total Time Spent with Patient Total Overall Time  1:00p 2:15p 75 min 75 min    Greater than 50%  of this time was spent counseling and coordinating care related to the above assessment and plan.  Freddie Breech, CNS-C Palliative Medicine Team [facility] Team Phone: [phone] Pager: [phone]

## 2012-10-16 NOTE — Progress Notes (Signed)
Internal Medicine Teaching Service Attending Note Date: 10/16/2012  Patient name: Rachel Bridges  Medical record number: [id number]  Date of birth: 11-Dec-1924   Rachel Bridges seems fatigued today. She has the NGT on and she is constantly draining dark colored fluid from NGT. Her total output yesterday has been 1000 ml. She has been on ice chips and sips of water.   Filed Vitals:   10/15/12 1400 10/15/12 2000 10/16/12 0031 10/16/12 0500  BP: 102/62 123/73 102/57 111/65  Pulse: 101 64 55 68  Temp: 98.1 F (36.7 C) 97.9 F (36.6 C) 97.9 F (36.6 C) 98.9 F (37.2 C)  TempSrc: Oral Oral Oral Oral  Resp: 18 18 18 18   Height:      Weight:    120 lb 4.8 oz (54.568 kg)  SpO2: 93% 96% 97% 96%    Looks fatigued, no acute distress.  S1S2 RRR, no murmur Lungs clear Abdomen- distended, lesser than yesterday, soft, non-tender. BS+ Ext: No pedal edema  Scheduled Meds . cefTRIAXone (ROCEPHIN) IVPB 1 gram/50 mL D5W  1 g Intravenous Q24H  . heparin  5,000 Units Subcutaneous Q8H  . hydrocortisone sod succinate (SOLU-CORTEF) inj  10 mg Intravenous Q breakfast   And  . hydrocortisone sod succinate (SOLU-CORTEF) inj  5 mg Intravenous QPM  . potassium chloride  10 mEq Intravenous Q1 Hr x 6  . sodium chloride  3 mL Intravenous Q12H     Assessment and Plan   Chronic Bowel Ileus - Continue NGT. Bowel rest. Continue hydration.  Replete electrolytes - correct hypokalemia.  Continue rocephin - day 2. Proceed with palliative care and comfort care talks per family wishes.      Rachel Bridges 10/16/2012, 1:12 PM.

## 2012-10-16 NOTE — Progress Notes (Signed)
Subjective: Her eyes were dry last night and she had a sore throat from the NG tube but she states that she feels better today. She denies abdominal pain.   Objective: Vital signs in last 24 hours: Filed Vitals:   10/15/12 1400 10/15/12 2000 10/16/12 0031 10/16/12 0500  BP: 102/62 123/73 102/57 111/65  Pulse: 101 64 55 68  Temp: 98.1 F (36.7 C) 97.9 F (36.6 C) 97.9 F (36.6 C) 98.9 F (37.2 C)  TempSrc: Oral Oral Oral Oral  Resp: 18 18 18 18   Height:      Weight:    120 lb 4.8 oz (54.568 kg)  SpO2: 93% 96% 97% 96%   Weight change: 6.4 oz (0.181 kg)  Intake/Output Summary (Last 24 hours) at 10/16/12 1434 Last data filed at 10/16/12 0600  Gross per 24 hour  Intake 2166.67 ml  Output    500 ml  Net 1666.67 ml   Vitals reviewed. General: resting in bed, in NAD HEENT: no scleral icterus, clear conjuctiva bl, NG tube in place Cardiac: RRR, no rubs, murmurs or gallops Pulm: clear to auscultation bilaterally, no wheezes, rales, or rhonchi Abd: soft, nontender, distended (much improved from yesterday), hypoactive BS present Ext: warm and well perfused, no pedal edema Neuro: alert and oriented to place, moves 4 extremities bilaterally  Lab Results: Basic Metabolic Panel:  Recent Labs Lab 10/14/12 2014 10/15/12 0522 10/16/12 0510  NA  --  142 142  K  --  3.5 2.8*  CL  --  110 112  CO2  --  22 21  GLUCOSE  --  130* 98  BUN  --  21 19  CREATININE  --  1.11* 1.24*  CALCIUM  --  8.0* 8.2*  MG 1.5  --   --   PHOS 3.1  --   --    Liver Function Tests:  Recent Labs Lab 10/14/12 1645 10/16/12 0510  AST 12 12  ALT 9 8  ALKPHOS 127* 86  BILITOT 0.3 0.2*  PROT 6.8 5.5*  ALBUMIN 2.8* 2.2*    Recent Labs Lab 10/14/12 1645  LIPASE 24   CBC:  Recent Labs Lab 10/14/12 1645 10/16/12 0510  WBC 7.5 5.8  NEUTROABS 6.3  --   HGB 12.4 10.6*  HCT 36.8 32.2*  MCV 85.4 86.3  PLT 202 188   CBG:  Recent Labs Lab 10/15/12 1956 10/16/12 0004 10/16/12 0452  10/16/12 0724 10/16/12 1147 10/16/12 1150  GLUCAP 100* 109* 95 97 66* 70     Urinalysis:  Recent Labs Lab 10/15/12 0324  COLORURINE YELLOW  LABSPEC 1.042*  PHURINE 5.5  GLUCOSEU NEGATIVE  HGBUR TRACE*  BILIRUBINUR NEGATIVE  KETONESUR NEGATIVE  PROTEINUR NEGATIVE  UROBILINOGEN 0.2  NITRITE NEGATIVE  LEUKOCYTESUR MODERATE*     Micro Results: Recent Results (from the past 240 hour(s))  URINE CULTURE     Status: None   Collection Time    10/15/12  3:24 AM      Result Value Range Status   Specimen Description URINE, RANDOM   Final   Special Requests CX ADDED AT 0406 ON [id number]   Final   Culture  Setup Time 10/15/2012 04:52   Final   Colony Count >=100,000 COLONIES/ML   Final   Culture GRAM NEGATIVE RODS   Final   Report Status PENDING   Incomplete   Studies/Results: Ct Abdomen Pelvis W Contrast  10/14/2012    see the*RADIOLOGY REPORT*  Clinical Data: Abdominal distention, nausea, small bowel obstruction.  CT ABDOMEN AND PELVIS WITH CONTRAST  Technique:  Multidetector CT imaging of the abdomen and pelvis was performed following the standard protocol during bolus administration of intravenous contrast.  Contrast: 75mL OMNIPAQUE IOHEXOL 300 MG/ML  SOLN  Comparison: 09/02/2012 CT.  Plain films 09/23/2012.  Findings: Lung bases are clear.  No pleural effusions.  Heart is normal size.  Continued gaseous distention of the proximal and mid colon which is decompressed beyond the distal transverse colon. Worsening small bowel distention since prior study.  Distal small bowel is markedly dilated, with more proximal small bowel also now dilated.  It is unclear this is related to severe ileus/chronic dysmotility or could be related to colonic obstruction.  No obstructing process is visualized and again, the appearance of the colon is similar to prior study.  Liver, spleen, pancreas, adrenals are unremarkable.  Bilateral renal cysts.  No hydronephrosis.  Aorta and iliac vessels demonstrate  scattered calcifications without aneurysm.  Postsurgical changes in the thoracic spine.  No acute bony abnormality.  IMPRESSION: Continued severe gaseous distention of the proximal and midportions of the colon, decompressed beyond the splenic flexure.  There is worsening dilatation of the small bowel since prior study. Multiple air-fluid levels.  It is unclear if the findings are related to severe dysmotility/ileus or possibly an obstruction, but no obstructing process visualized at the area of caliber change in the distal transverse colon.   Original Report Authenticated By: Charlett Nose, M.D.   Medications: I have reviewed the patient's current medications. Scheduled Meds: . cefTRIAXone (ROCEPHIN) IVPB 1 gram/50 mL D5W  1 g Intravenous Q24H  . heparin  5,000 Units Subcutaneous Q8H  . hydrocortisone sod succinate (SOLU-CORTEF) inj  10 mg Intravenous Q breakfast   And  . hydrocortisone sod succinate (SOLU-CORTEF) inj  5 mg Intravenous QPM  . potassium chloride  10 mEq Intravenous Q1 Hr x 6  . sodium chloride  3 mL Intravenous Q12H   Continuous Infusions: . dextrose 5 % and 0.45% NaCl 100 mL/hr at 10/15/12 1216   PRN Meds:.ondansetron (ZOFRAN) IV, polyvinyl alcohol Assessment/Plan: Bowel ileus: This is a recurrent problem likely related to celiac disease and noncompliance to gluten free diet. She does have UTI that could have contributed to her ileus. CT scan bowel gaseous distention involving both colon and small bowel. She has had no BM today. She remains hemodynamically stable. NG tube reinserted yesterday with 500cc output today.  She denies abdominal pain, her abdomen is less distended today.  - NG tube to suctioning  - IVF with D5/1/2 NS at 75 cc/hr. Will monitor carefully given history of pulmonary edema.  - NPO with sips of water, juice/Italian ice  - Zofran prn  - Hold oral meds for now  - serial abdominal exam  - Palliative care consult for symptoms management and planning options  -  Spiritual/Pastoral care for assistance with HPOA forms   UTI. UA with pyuria. Her daughter reports that Ms. Escalona has had increased urinary frequency. This could be a contributing factor to her ileus.  -Continue Rocephin IV   Hypokalemia: K of 2.8 today. Likely 2/2 GI losses. Replete with K IV x 6  - monitor with BMET   Chronic Diarrhea: Likely related to gluten intolerance and poor compliance to gluten free diet, s/p partial colectomy secondary to colon cancer (in presumed remission). She denies recent antibiotic or PPI use with C.diff negative PCR recently. She was evaluated by Dr Loreta Ave with endoscopy in 11/2009 which was negative. Prior she  had elevated TTG IgG of 93. Gluten intolerance was based on clinical symptoms and since then she has been on GF free with poor compliance as the patient loves to eat bread and candy. No BM today.  - C.diff PCR ordered  - IVF as above   Adrenal insuffiency: Dx in Nov 2012. Pt is followed by Dr Sharl Ma of endocrinology. She takes Hydrocortisone 10 mg in a.m and 5 mg in p.m. She has no clinical suspicion for acute adrenal crisis.  - Continue IV with Solu-cortef 10 mg a.m and 5 mg p.m  - consider reevaluation for adrenal insuffiency if her symptoms persist   CKD stage 2: Stable creatine level, slightly trended down.   Depression: She takes Zoloft. Restart once able to take medications per mouth.   Dementia. Moderate to severe Alzheimer's with steady decline per her daughter/caregiver.   Failure to thrive. Recent weight loss and albumin of 2.8, likely secondary to progressive dementia, chronic diarrhea.   Code Status: Brief goals of care meeting on 6/19 with her and her two caregivers daughters, [name] (pursuing HPOA), and [place] with decision for DNR/DNI.   DVT ppx: Heparin TID.   Dispo: Disposition is deferred at this time, awaiting improvement of current medical problems.  Anticipated discharge in approximately 2-3 day(s).   The patient does have a  current PCP (Lars Mage, MD) and does need an [facility] hospital follow-up appointment after discharge.  The patient does not have transportation limitations that hinder transportation to clinic appointments.  .Services Needed at time of discharge: Y = Yes, Blank = No PT:   OT:   RN:   Equipment:   Other:     LOS: 2 days   Ky Barban, MD 10/16/2012, 2:34 PM

## 2012-10-16 NOTE — Progress Notes (Signed)
Chaplain Note: First met pt at 11:30 for scheduled goals of care meeting and advance directive education. Meeting rescheduled to 1:00 pm by pt's dtrs.  1:00 pm...participated in goals of care meeting conducted by MM at bedside.  Pt's 2 daughters present and very clear on what the pt's wishes are. They both appear to be at peace with pt's current condition and are realistic about future outcomes.  Following GOC I carried out Advance Directive education and helped pt and her older daughter both complete Living Will and HCPOA for each of them.  Will follow up as needed.   Rutherford Nail Chaplain [phone]

## 2012-10-16 NOTE — Progress Notes (Signed)
Patient WU:[id number] Rachel Bridges      DOB: July 30, 1924      NWG:[id number]  PMT phone message left by patients daughter [name] requesting to delay PMT meeting from 11:30a until 1:00p  Freddie Breech, CNS-C Palliative Medicine Team [facility] Team Phone: [phone] Pager: [phone]

## 2012-10-17 LAB — CBC
HCT: 33.6 % — ABNORMAL LOW (ref 36.0–46.0)
Hemoglobin: 10.7 g/dL — ABNORMAL LOW (ref 12.0–15.0)
MCH: 27.6 pg (ref 26.0–34.0)
MCHC: 31.8 g/dL (ref 30.0–36.0)
MCV: 86.8 fL (ref 78.0–100.0)
Platelets: 161 K/uL (ref 150–400)
RBC: 3.87 MIL/uL (ref 3.87–5.11)
RDW: 15.6 % — ABNORMAL HIGH (ref 11.5–15.5)
WBC: 4.3 K/uL (ref 4.0–10.5)

## 2012-10-17 LAB — COMPREHENSIVE METABOLIC PANEL WITH GFR
ALT: 8 U/L (ref 0–35)
AST: 12 U/L (ref 0–37)
Albumin: 2.1 g/dL — ABNORMAL LOW (ref 3.5–5.2)
Alkaline Phosphatase: 87 U/L (ref 39–117)
BUN: 16 mg/dL (ref 6–23)
CO2: 21 meq/L (ref 19–32)
Calcium: 8.1 mg/dL — ABNORMAL LOW (ref 8.4–10.5)
Chloride: 114 meq/L — ABNORMAL HIGH (ref 96–112)
Creatinine, Ser: 1.19 mg/dL — ABNORMAL HIGH (ref 0.50–1.10)
GFR calc Af Amer: 47 mL/min — ABNORMAL LOW
GFR calc non Af Amer: 40 mL/min — ABNORMAL LOW
Glucose, Bld: 84 mg/dL (ref 70–99)
Potassium: 3.8 meq/L (ref 3.5–5.1)
Sodium: 140 meq/L (ref 135–145)
Total Bilirubin: 0.2 mg/dL — ABNORMAL LOW (ref 0.3–1.2)
Total Protein: 5.3 g/dL — ABNORMAL LOW (ref 6.0–8.3)

## 2012-10-17 LAB — GLUCOSE, CAPILLARY
Glucose-Capillary: 57 mg/dL — ABNORMAL LOW (ref 70–99)
Glucose-Capillary: 78 mg/dL (ref 70–99)
Glucose-Capillary: 83 mg/dL (ref 70–99)
Glucose-Capillary: 93 mg/dL (ref 70–99)
Glucose-Capillary: 95 mg/dL (ref 70–99)

## 2012-10-17 LAB — URINE CULTURE

## 2012-10-17 MED ORDER — MENTHOL 3 MG MT LOZG
1.0000 | LOZENGE | OROMUCOSAL | Status: DC | PRN
  Filled 2012-10-17: qty 9

## 2012-10-17 MED ORDER — DIPHENHYDRAMINE HCL 50 MG/ML IJ SOLN
12.5000 mg | Freq: Once | INTRAMUSCULAR | Status: AC
  Administered 2012-10-17: 12.5 mg via INTRAVENOUS
  Filled 2012-10-17: qty 1

## 2012-10-17 NOTE — Progress Notes (Addendum)
Subjective: She has a cough today with scant clear sputum and asks for cough drops. She denies abdominal pain, she has been passing flatus.   Objective: Vital signs in last 24 hours: Filed Vitals:   10/16/12 1500 10/16/12 1947 10/17/12 0000 10/17/12 0434  BP: 111/59 127/69  116/67  Pulse: 64 64  67  Temp: 97.6 F (36.4 C) 97.9 F (36.6 C)  97.9 F (36.6 C)  TempSrc: Oral Oral  Oral  Resp: 16 18  18   Height:      Weight:   116 lb 14.4 oz (53.025 kg)   SpO2: 96% 95%  98%   Weight change: -3 lb 6.4 oz (-1.542 kg)  Intake/Output Summary (Last 24 hours) at 10/17/12 1046 Last data filed at 10/17/12 0820  Gross per 24 hour  Intake    565 ml  Output    800 ml  Net   -235 ml   Vitals reviewed.  General: resting in bed, in NAD  HEENT: no scleral icterus, clear conjuctiva bl, NG tube in place  Cardiac: RRR, no rubs, murmurs or gallops  Pulm: clear to auscultation bilaterally, no wheezes, rales, or rhonchi  Abd: soft, nontender, distended (much improved from yesterday), hypoactive BS present  Ext: warm and well perfused, trace pedal edema bilaterally Neuro: alert and oriented to place, moves 4 extremities bilaterally  Lab Results: Basic Metabolic Panel:  Recent Labs Lab 10/14/12 2014  10/16/12 0510 10/17/12 0610  NA  --   < > 142 140  K  --   < > 2.8* 3.8  CL  --   < > 112 114*  CO2  --   < > 21 21  GLUCOSE  --   < > 98 84  BUN  --   < > 19 16  CREATININE  --   < > 1.24* 1.19*  CALCIUM  --   < > 8.2* 8.1*  MG 1.5  --   --   --   PHOS 3.1  --   --   --   < > = values in this interval not displayed. Liver Function Tests:  Recent Labs Lab 10/16/12 0510 10/17/12 0610  AST 12 12  ALT 8 8  ALKPHOS 86 87  BILITOT 0.2* 0.2*  PROT 5.5* 5.3*  ALBUMIN 2.2* 2.1*    Recent Labs Lab 10/14/12 1645  LIPASE 24   CBC:  Recent Labs Lab 10/14/12 1645 10/16/12 0510 10/17/12 0610  WBC 7.5 5.8 4.3  NEUTROABS 6.3  --   --   HGB 12.4 10.6* 10.7*  HCT 36.8 32.2*  33.6*  MCV 85.4 86.3 86.8  PLT 202 188 161   CBG:  Recent Labs Lab 10/16/12 1946 10/17/12 0008 10/17/12 0430 10/17/12 0752 10/17/12 0830 10/17/12 0923  GLUCAP 77 93 78 57* 63* 85      Urinalysis:  Recent Labs Lab 10/15/12 0324  COLORURINE YELLOW  LABSPEC 1.042*  PHURINE 5.5  GLUCOSEU NEGATIVE  HGBUR TRACE*  BILIRUBINUR NEGATIVE  KETONESUR NEGATIVE  PROTEINUR NEGATIVE  UROBILINOGEN 0.2  NITRITE NEGATIVE  LEUKOCYTESUR MODERATE*     Micro Results: Recent Results (from the past 240 hour(s))  URINE CULTURE     Status: None   Collection Time    10/15/12  3:24 AM      Result Value Range Status   Specimen Description URINE, RANDOM   Final   Special Requests CX ADDED AT 0406 ON [id number]   Final   Culture  Setup Time 10/15/2012  04:52   Final   Colony Count >=100,000 COLONIES/ML   Final   Culture KLEBSIELLA PNEUMONIAE   Final   Report Status 10/17/2012 FINAL   Final   Organism ID, Bacteria KLEBSIELLA PNEUMONIAE   Final   Studies/Results: No results found. Medications: I have reviewed the patient's current medications. Scheduled Meds: . cefTRIAXone (ROCEPHIN) IVPB 1 gram/50 mL D5W  1 g Intravenous Q24H  . heparin  5,000 Units Subcutaneous Q8H  . hydrocortisone sod succinate (SOLU-CORTEF) inj  10 mg Intravenous Q breakfast   And  . hydrocortisone sod succinate (SOLU-CORTEF) inj  5 mg Intravenous QPM  . sodium chloride  3 mL Intravenous Q12H   Continuous Infusions: . dextrose 5 % and 0.45% NaCl 1,000 mL with potassium chloride 40 mEq infusion 75 mL/hr at 10/16/12 2204   PRN Meds:.ondansetron (ZOFRAN) IV, polyvinyl alcohol Assessment/Plan: Bowel ileus: This is a recurrent problem likely related to celiac disease and noncompliance to gluten free diet. She does have UTI that could have contributed to her ileus. CT scan bowel gaseous distention involving both colon and small bowel. She remains hemodynamically stable. NG tube with minimum output this morning after being  clamped. She had large BM, diarrhea (chornic) with no blood. She denies abdominal pain, her abdomen is less distended today.  - Discontinue NG tube  - Clear liquid diet, advance as tolerated (Gluten free diet) - IVF with D5/1/2 NS at 75 cc/hr. Will discontinue once she is tolerating intake per mouth  - Zofran prn  - Hold oral meds for now  - serial abdominal exam  - Palliative care consult for symptoms management and planning options  - Appreciate Spiritual/Pastoral care for assistance with HPOA forms   UTI. UA with pyuria. Her daughter reports that Ms. Nolte has had increased urinary frequency. This could be a contributing factor to her ileus. Urine culture with Klebsiella Pneumoniae sensitive to Rocephin.  -Continue Rocephin IV   Hypokalemia: K of 3.8 today. Likely 2/2 GI losses.  -Continue D5, 1/2 NS with KCL at 83ml/hr - monitor with BMET   Chronic Diarrhea: Likely related to gluten intolerance and poor compliance to gluten free diet, s/p partial colectomy secondary to colon cancer (in presumed remission). She denies recent antibiotic or PPI use with C.diff negative PCR recently. She was evaluated by Dr Loreta Ave with endoscopy in 11/2009 which was negative. Prior she had elevated TTG IgG of 93. Gluten intolerance was based on clinical symptoms and since then she has been on GF free with poor compliance as the patient loves to eat bread and candy. BM today with diarrhea.  - C.diff PCR ordered  - IVF as above   Adrenal insuffiency: Dx in Nov 2012. Pt is followed by Dr Sharl Ma of endocrinology. She takes Hydrocortisone 10 mg in a.m and 5 mg in p.m. She has no clinical suspicion for acute adrenal crisis.  - Continue IV with Solu-cortef 10 mg a.m and 5 mg p.m  - consider reevaluation for adrenal insuffiency if her symptoms persist   CKD stage 2: Stable creatine level, slightly trended down.   Depression: She takes Zoloft. Restart once able to take medications per mouth (tomorrow)    Dementia. Moderate to severe Alzheimer's with steady decline per her daughter/caregiver. GOC with Palliative, please see their notes.   Failure to thrive. Recent weight loss and albumin of 2.8, likely secondary to progressive dementia, chronic diarrhea. .  Code Status: Brief goals of care meeting on 6/19 with her and her two caregivers daughters,  [name] (HPOA), and [place] with decision for DNR/DNI.   DVT ppx: Heparin TID.   Dispo: Disposition is deferred at this time, awaiting improvement of current medical problems.  Anticipated discharge in approximately 1-2 day(s).   The patient does have a current PCP (Lars Mage, MD) and does need an [facility] hospital follow-up appointment after discharge.  The patient does not have transportation limitations that hinder transportation to clinic appointments.  .Services Needed at time of discharge: Y = Yes, Blank = No PT:   OT:   RN:   Equipment:   Other:     LOS: 3 days   Ky Barban, MD 10/17/2012, 10:46 AM

## 2012-10-18 LAB — COMPREHENSIVE METABOLIC PANEL
ALT: 7 U/L (ref 0–35)
AST: 13 U/L (ref 0–37)
Albumin: 1.9 g/dL — ABNORMAL LOW (ref 3.5–5.2)
Calcium: 7.9 mg/dL — ABNORMAL LOW (ref 8.4–10.5)
Potassium: 4.6 mEq/L (ref 3.5–5.1)
Sodium: 138 mEq/L (ref 135–145)
Total Protein: 4.9 g/dL — ABNORMAL LOW (ref 6.0–8.3)

## 2012-10-18 LAB — CBC
HCT: 31.9 % — ABNORMAL LOW (ref 36.0–46.0)
Hemoglobin: 10.3 g/dL — ABNORMAL LOW (ref 12.0–15.0)
MCH: 28.3 pg (ref 26.0–34.0)
MCHC: 32.3 g/dL (ref 30.0–36.0)
MCV: 87.6 fL (ref 78.0–100.0)

## 2012-10-18 MED ORDER — HYDROCORTISONE 5 MG PO TABS: 5.0000 mg | ORAL_TABLET | Freq: Two times a day (BID) | ORAL | Status: DC

## 2012-10-18 MED ORDER — ZOLPIDEM TARTRATE 5 MG PO TABS
5.0000 mg | ORAL_TABLET | Freq: Every evening | ORAL | Status: DC | PRN
  Administered 2012-10-18: 5 mg via ORAL
  Filled 2012-10-18: qty 1

## 2012-10-18 MED ORDER — HYDROCORTISONE 10 MG PO TABS
10.0000 mg | ORAL_TABLET | Freq: Every day | ORAL | Status: DC
  Administered 2012-10-19: 10 mg via ORAL
  Filled 2012-10-18 (×2): qty 1

## 2012-10-18 MED ORDER — PARICALCITOL 1 MCG PO CAPS
1.0000 ug | ORAL_CAPSULE | ORAL | Status: DC
  Administered 2012-10-19: 1 ug via ORAL
  Filled 2012-10-18: qty 1

## 2012-10-18 MED ORDER — SERTRALINE HCL 50 MG PO TABS
50.0000 mg | ORAL_TABLET | Freq: Every day | ORAL | Status: DC
  Administered 2012-10-18 – 2012-10-19 (×2): 50 mg via ORAL
  Filled 2012-10-18 (×2): qty 1

## 2012-10-18 MED ORDER — HYDROCORTISONE 5 MG PO TABS
5.0000 mg | ORAL_TABLET | Freq: Every evening | ORAL | Status: DC
  Administered 2012-10-18: 5 mg via ORAL
  Filled 2012-10-18 (×2): qty 1

## 2012-10-18 MED ORDER — DIPHENHYDRAMINE HCL 25 MG PO CAPS: 25.0000 mg | ORAL_CAPSULE | Freq: Every evening | ORAL | Status: DC | PRN

## 2012-10-18 NOTE — Progress Notes (Signed)
Subjective: She got NG tube out yesterday and is doing well this morning. She is still passing gas and had a bowel movement last night and today. She has not had pain or vomiting. There was no blood in the bowel movements. She has tolerated liquid diet and would like some more soft foods today. She is not having any SOB or chest pain. She did not sleep well due to repeated glucose checks. Her daughter is present in the room and helps to provide history.   Objective: Vital signs in last 24 hours: Filed Vitals:   10/17/12 0434 10/17/12 1523 10/17/12 2100 10/18/12 0500  BP: 116/67 115/58 144/65 133/61  Pulse: 67 68 63 55  Temp: 97.9 F (36.6 C) 98.4 F (36.9 C) 98 F (36.7 C) 97.4 F (36.3 C)  TempSrc: Oral Oral    Resp: 18   18  Height:      Weight:    121 lb (54.885 kg)  SpO2: 98% 98% 95% 100%   Weight change: 4 lb 1.6 oz (1.86 kg)  Intake/Output Summary (Last 24 hours) at 10/18/12 1018 Last data filed at 10/17/12 2100  Gross per 24 hour  Intake    540 ml  Output    250 ml  Net    290 ml   Vitals reviewed.  General: resting in bed, in NAD  HEENT: no scleral icterus, clear conjuctiva bl Cardiac: RRR, no rubs, murmurs or gallops  Pulm: clear to auscultation bilaterally, no wheezes, rales, or rhonchi  Abd: soft, nontender, minimal distention, hypoactive BS present  Ext: warm and well perfused, trace pedal edema bilaterally Neuro: alert and oriented to person and place, moves 4 extremities bilaterally  Lab Results: Basic Metabolic Panel:  Recent Labs Lab 10/14/12 2014  10/17/12 0610 10/18/12 0500  NA  --   < > 140 138  K  --   < > 3.8 4.6  CL  --   < > 114* 113*  CO2  --   < > 21 20  GLUCOSE  --   < > 84 88  BUN  --   < > 16 11  CREATININE  --   < > 1.19* 1.11*  CALCIUM  --   < > 8.1* 7.9*  MG 1.5  --   --   --   PHOS 3.1  --   --   --   < > = values in this interval not displayed. Liver Function Tests:  Recent Labs Lab 10/17/12 0610 10/18/12 0500  AST 12  13  ALT 8 7  ALKPHOS 87 75  BILITOT 0.2* 0.2*  PROT 5.3* 4.9*  ALBUMIN 2.1* 1.9*    Recent Labs Lab 10/14/12 1645  LIPASE 24   CBC:  Recent Labs Lab 10/14/12 1645  10/17/12 0610 10/18/12 0500  WBC 7.5  < > 4.3 3.8*  NEUTROABS 6.3  --   --   --   HGB 12.4  < > 10.7* 10.3*  HCT 36.8  < > 33.6* 31.9*  MCV 85.4  < > 86.8 87.6  PLT 202  < > 161 159  < > = values in this interval not displayed. CBG:  Recent Labs Lab 10/17/12 0923 10/17/12 1134 10/17/12 1619 10/17/12 2038 10/18/12 0005 10/18/12 0416  GLUCAP 85 83 95 108* 104* 89   Medications: I have reviewed the patient's current medications. Scheduled Meds: . cefTRIAXone (ROCEPHIN) IVPB 1 gram/50 mL D5W  1 g Intravenous Q24H  . heparin  5,000 Units  Subcutaneous Q8H  . hydrocortisone sod succinate (SOLU-CORTEF) inj  10 mg Intravenous Q breakfast   And  . hydrocortisone sod succinate (SOLU-CORTEF) inj  5 mg Intravenous QPM  . sodium chloride  3 mL Intravenous Q12H   Continuous Infusions: . dextrose 5 % and 0.45% NaCl 1,000 mL with potassium chloride 40 mEq infusion 75 mL/hr at 10/16/12 2204   PRN Meds:.menthol-cetylpyridinium, ondansetron (ZOFRAN) IV, polyvinyl alcohol Assessment/Plan: Bowel ileus: This is a recurrent problem likely related to celiac disease and noncompliance to gluten free diet. She does have UTI that could have contributed to her ileus. Doing well and having BMs and gas without the NG and tolerated diet. - Increase to mechanical soft low residue diet - Zofran prn  - Hold oral meds for now  - Palliative care consult for symptoms management and planning options  - Appreciate Spiritual/Pastoral care for assistance with HPOA forms   UTI. UA with pyuria. Her daughter reports that Ms. Toothaker has had increased urinary frequency. This could be a contributing factor to her ileus. Urine culture with Klebsiella Pneumoniae sensitive to Rocephin.  -Continue Rocephin IV   Chronic Diarrhea: Likely related  to gluten intolerance and poor compliance to gluten free diet, s/p partial colectomy secondary to colon cancer (in presumed remission). She denies recent antibiotic or PPI use with C.diff negative PCR recently. She was evaluated by Dr Loreta Ave with endoscopy in 11/2009 which was negative. Prior she had elevated TTG IgG of 93. Gluten intolerance was based on clinical symptoms and since then she has been on GF free with poor compliance as the patient loves to eat bread and candy.  - C.diff PCR in process   Adrenal insuffiency: Dx in Nov 2012. Pt is followed by Dr Sharl Ma of endocrinology. She takes Hydrocortisone 10 mg in a.m and 5 mg in p.m. She has no clinical suspicion for acute adrenal crisis.  -Switch back to home regimen    CKD stage 2: Stable creatine level.  Depression: She takes Zoloft and will restart.  Dementia. Moderate to severe Alzheimer's with steady decline per her daughter/caregiver. GOC with Palliative, please see their notes.   Failure to thrive. Recent weight loss and albumin of 2.8, likely secondary to progressive dementia, chronic diarrhea. .  Code Status: Brief goals of care meeting on 6/19 with her and her two caregivers daughters, [name] (HPOA), and [place] with decision for DNR/DNI.   DVT ppx: Heparin TID.   Dispo: Disposition is deferred at this time, awaiting improvement of current medical problems.  Anticipated discharge in approximately 1 day(s).   The patient does have a current PCP (Lars Mage, MD) and does need an [facility] hospital follow-up appointment after discharge.  The patient does not have transportation limitations that hinder transportation to clinic appointments.  .Services Needed at time of discharge: Y = Yes, Blank = No PT:   OT:   RN:   Equipment:   Other:     LOS: 4 days   Judie Bonus, MD 10/18/2012, 10:18 AM

## 2012-10-19 DIAGNOSIS — N390 Urinary tract infection, site not specified: Secondary | ICD-10-CM | POA: Diagnosis present

## 2012-10-19 MED ORDER — ZOLPIDEM TARTRATE 5 MG PO TABS: 5.0000 mg | ORAL_TABLET | Freq: Every evening | ORAL | Status: DC | PRN

## 2012-10-19 NOTE — Discharge Summary (Signed)
Name: Rachel Bridges MRN: [id number] DOB: Sep 25, 1924 79 y.o. PCP: Lars Mage, MD  Date of Admission: 10/14/2012  4:23 PM Date of Discharge: 10/19/2012 Attending Physician: Aletta Edouard, MD  Discharge Diagnosis: Principal Problem:   Ileus Active Problems:   UTI (urinary tract infection)   Gluten intolerance   Failure to thrive   Severe malnutrition   Hypokalemia   Nausea & vomiting   Failure to thrive   CKD (chronic kidney disease) stage 2, GFR 60-89 ml/min   Adrenal insufficiency   Chronic diarrhea   Dementia   DEPRESSION   Palliative care encounter  Discharge Medications:   Medication List    TAKE these medications       acetaminophen 325 MG tablet  Commonly known as:  TYLENOL  Take 325 mg by mouth daily as needed. For pain     aspirin 81 MG tablet  Take 81 mg by mouth daily.     calcium citrate-vitamin D 315-200 MG-UNIT per tablet  Commonly known as:  CITRACAL+D  Take 1 tablet by mouth 2 (two) times daily.     cyanocobalamin 1000 MCG/ML injection  Commonly known as:  (VITAMIN B-12)  Inject 1 mL (1,000 mcg total) into the muscle every 30 (thirty) days.     feeding supplement Liqd  Take 237 mLs by mouth daily. Vanilla.     furosemide 20 MG tablet  Commonly known as:  LASIX  Take 1 tablet (20 mg total) by mouth daily.     guaifenesin 100 MG/5ML syrup  Commonly known as:  ROBITUSSIN  Take 5 mLs (100 mg total) by mouth every 4 (four) hours as needed for congestion.     hydrocortisone 10 MG tablet  Commonly known as:  CORTEF  Take 5-10 mg by mouth 2 (two) times daily. 10 mg in the a.m. And 5 mg in the afternoon.     ondansetron 8 MG disintegrating tablet  Commonly known as:  ZOFRAN ODT  Take 1 tablet (8 mg total) by mouth every 8 (eight) hours as needed for nausea.     potassium chloride 10 MEQ tablet  Commonly known as:  K-DUR  Take 20 mEq by mouth 2 (two) times daily.     sertraline 50 MG tablet  Commonly known as:  ZOLOFT  Take 1 tablet (50  mg total) by mouth daily.     simethicone 80 MG chewable tablet  Commonly known as:  MYLICON  Chew 160 mg by mouth daily as needed. For flatulence     ZEMPLAR 1 MCG capsule  Generic drug:  paricalcitol  Take 1 mcg by mouth every Monday, Wednesday, and Friday.     zolpidem 5 MG tablet  Commonly known as:  AMBIEN  Take 1 tablet (5 mg total) by mouth at bedtime as needed for sleep.        Disposition and follow-up:   Rachel Bridges was discharged from [facility] in Stable condition.  At the hospital follow up visit please address:  1.  Reexamine her abdomen for worsening distention (she has moderate distention at baseline). Assure adequate follow up of Home Health services ordered prior to her discharge. Reassess her for UTI symptoms such as increased frequency and dysuria and consider continued antibiotic treatment.   2.  Labs / imaging needed at time of follow-up: BMET for potassium and creatinine trending. Consider repeat UA and urine culture if symptoms concerning for recurrent UTI.   3.  Pending labs/ test needing follow-up: None  Follow-up Appointments: Follow-up Information   Follow up with Bronson Curb, MD On 10/26/2012. (at 9:15AM)    Contact information:   [address] [place] [address] [phone]       Discharge Instructions:  Future Appointments Provider Department Dept Phone   10/26/2012 9:15 AM Bronson Curb, MD [facility] [phone]   02/17/2013 1:00 PM [facility] [phone]   02/17/2013 2:00 PM Mauri [place] [facility] MEDICAL ONCOLOGY [phone]   02/24/2013 9:00 AM Si Gaul, MD [facility] MEDICAL ONCOLOGY [phone]      Consultations: Treatment Team:  Palliative [facility]  Procedures Performed:  Ct Abdomen Pelvis W Contrast  10/14/2012    see the*RADIOLOGY REPORT*  Clinical Data:  Abdominal distention, nausea, small bowel obstruction.  CT ABDOMEN AND PELVIS WITH CONTRAST  Technique:  Multidetector CT imaging of the abdomen and pelvis was performed following the standard protocol during bolus administration of intravenous contrast.  Contrast: 75mL OMNIPAQUE IOHEXOL 300 MG/ML  SOLN  Comparison: 09/02/2012 CT.  Plain films 09/23/2012.  Findings: Lung bases are clear.  No pleural effusions.  Heart is normal size.  Continued gaseous distention of the proximal and mid colon which is decompressed beyond the distal transverse colon. Worsening small bowel distention since prior study.  Distal small bowel is markedly dilated, with more proximal small bowel also now dilated.  It is unclear this is related to severe ileus/chronic dysmotility or could be related to colonic obstruction.  No obstructing process is visualized and again, the appearance of the colon is similar to prior study.  Liver, spleen, pancreas, adrenals are unremarkable.  Bilateral renal cysts.  No hydronephrosis.  Aorta and iliac vessels demonstrate scattered calcifications without aneurysm.  Postsurgical changes in the thoracic spine.  No acute bony abnormality.  IMPRESSION: Continued severe gaseous distention of the proximal and midportions of the colon, decompressed beyond the splenic flexure.  There is worsening dilatation of the small bowel since prior study. Multiple air-fluid levels.  It is unclear if the findings are related to severe dysmotility/ileus or possibly an obstruction, but no obstructing process visualized at the area of caliber change in the distal transverse colon.   Original Report Authenticated By: Charlett Nose, M.D.   Dg Abd Acute W/chest  09/23/2012   *RADIOLOGY REPORT*  Clinical Data: Abdominal pain, diarrhea, past history of colon cancer, smoking  ACUTE ABDOMEN SERIES (ABDOMEN 2 VIEW & CHEST 1 VIEW)  Comparison: 06/01/2012  Findings: Normal heart size and pulmonary vascularity. Tortuous aorta. Lungs  emphysematous but clear. No pleural effusion or pneumothorax. Gaseous distention of colon with bowel interposition between liver and diaphragm. No definite bowel wall thickening or free intraperitoneal air. Distended air filled loops of small bowel are also visualized. Osseous demineralization with prior lumbar fusion. Numerous pelvic phleboliths.  IMPRESSION: Mild gaseous distention of large and small bowel loops in abdomen without discrete focal point of obstruction identified. Findings could represent colonic obstruction or ileus; if further imaging is clinically indicated, consider computed tomography of the abdomen and pelvis with IV and oral contrast.   Original Report Authenticated By: Ulyses Southward, M.D.    Admission HPI:  Rachel Bridges is an 79 year-old woman with PMH significant for adrenal insufficiency (empty sella syndrome), monoclonal gammopathy, vitamin B12 deficiency, colon cancer (s/p hemicolectomy), and chronic colonic ileus who presents to [facility] ED on 10/14/12 with abdominal distention, nausea and minimal bilious, non-bloody vomiting. She is accompanied by her daughter,  Beverly. Symptoms began at 2:30a on the morning prior to admission (suspect patient awoke due to power outage and feeling hot). She had abdominal pain early this morning, which she cannot characterize, and has subsequently resolved. Therapies tried without improvement of nausea include simethicone, zofran and coke. Her daughter noticed pockets of bowel becoming visible this morning, that has since improved. Patient reports regular BMs, with most recent BM after abdominal CT in ED. Stool was loose, green, mucous appearing without blood. She has history of chronic diarrhea due to gluten intolerance. She reports subjective fever & chills, as well as indigestion. She notes decreased appetite on day of admission, but normal appetite yesterday with good fluid intake. She felt dizzy earlier this morning, and fell while moving from the bedroom  to the living room while using her walker. She did not lose consciousness or hit her head. Fall was witnessed by her daughter and son in law. Symptoms feels similar to her last admission in May 2014.   Hospital Course by problem list:  Bowel ileus: On presentation she had abdominal pain and abdominal distention increased from her baseline with CT abdomen/pelvis remarkable for severe gaseous distention of the proximal and midportions of the colon but no obstructing process. Acute abdomen series X-ray with no free intraperitoneal air. Bowel ileus, unfortunately, is a recurrent problem for Rachel Bridges that is likely related to celiac disease and noncompliance to gluten free diet. Her UA was remarkable for pyuria concerning for UTI that was treated as this could have worsened her ileus. She was treated with bowel rest, including NG tube with high output at first but with gradual decrease in return. Her abdominal pain resolved by the second day of her hospitalization. Her diet was slowly advanced after her NG tube was removed. Of note, on the second day of her admission, she removed her NG tube and at first refused to have it replaced. After meeting her family, however, she agreed to having her NG tube replaced and tolerated it relatively well. The Palliative Care Team was consulted for further goals of care and planning options and the family. Rachel Bridges recovered from her ileus having bowel movements, passing flatus, and tolerating a diet well prior to her discharge. She will follow up at the [facility] and will be discharged home with Home Health RN, Nurse aide, and PT/OT.    UTI. UA on presentation remarkable for pyuria. Her daughter reports that Rachel Bridges has had increased urinary frequency prior to her presentation but it was unclear if this was chronic or acute (reported as present for weeks to months). She was treated with Rocephin IV. Her urine culture with Klebsiella Pneumoniae sensitive to Rocephin. She denied  dysuria or urinary frequency. She will follow up with the [facility] for evaluation of her symptoms and need for continue antibiotic treatment.    Chronic Diarrhea: Likely related to gluten intolerance and poor compliance to gluten free diet and possibly due to herpartial colectomy secondary to colon cancer (in presumed remission). She denies recent antibiotic or PPI use with stool C.diff PCR negative. She was evaluated by Dr Loreta Ave with endoscopy in 11/2009 which was unremarkable. She had elevated TTG IgG of 93 concerning for celiac diseaase. Gluten intolerance was based on clinical symptoms and since then she has been on a Gluten free diet with poor compliance as the patient loves to eat breads and candy. Her daughters plan to follow a gluten free diet as well in order to minimize Rachel Bridges access to foods  with gluten.  Adrenal insuffiency: Stable. She was diagnosed in Nov 2012 and is  followed by Dr. Sharl Ma in Endocrinology. She takes Hydrocortisone 10 mg in a.m and 5 mg in p.m. She had no clinical suspicion for acute adrenal crisis and we continued her home regimen, intravenously while she was NPO and back to PO once her intake per mouth was resumed.   CKD stage 2: Stable creatine level. Repeat BMET at [facility] follow up.   Depression: Stable. She takes Zoloft which was restarted once she tolerated intake per mouth.    Dementia. Moderate to severe Alzheimer's with steady decline per her daughter/caregiver. Goals of Care meeting with Palliative Care during this hospitalization. MOST form filled out, placed in chart with DNR/DNI form. At this time her daughters have elected to bring Rachel Bridges for any acute medical issues for evaluation and treatment but they are aware of potential decline and understand that full comfort approach with hospice support may be warranted with future decline.   Failure to thrive. Recent weight loss and albumin of 2.8, likely secondary to progressive dementia, chronic diarrhea. Her  daughters will continue caring for her at home with a more strict gluten free diet. Home Health services ordered for PT/OT, RN, nurse aide. Please see above for discussion of Palliative Care meeting during hospitalization.   Discharge Vitals:   BP 151/73  Pulse 59  Temp(Src) 97.9 F (36.6 C) (Oral)  Resp 16  Ht 5\' 7"  (1.702 m)  Wt 118 lb (53.524 kg)  BMI 18.48 kg/m2  SpO2 92%  Discharge Labs:  Basic Metabolic Panel:   Recent Labs  Lab  10/14/12 2014   10/17/12 0610  10/18/12 0500   NA  --  < >  140  138   K  --  < >  3.8  4.6   CL  --  < >  114*  113*   CO2  --  < >  21  20   GLUCOSE  --  < >  84  88   BUN  --  < >  16  11   CREATININE  --  < >  1.19*  1.11*   CALCIUM  --  < >  8.1*  7.9*   MG  1.5  --  --  --   PHOS  3.1  --  --  --   < > = values in this interval not displayed.  Liver Function Tests:   Recent Labs  Lab  10/17/12 0610  10/18/12 0500   AST  12  13   ALT  8  7   ALKPHOS  87  75   BILITOT  0.2*  0.2*   PROT  5.3*  4.9*   ALBUMIN  2.1*  1.9*     Recent Labs  Lab  10/14/12 1645   LIPASE  24    CBC:   Recent Labs  Lab  10/14/12 1645   10/17/12 0610  10/18/12 0500   WBC  7.5  < >  4.3  3.8*   NEUTROABS  6.3  --  --  --   HGB  12.4  < >  10.7*  10.3*   HCT  36.8  < >  33.6*  31.9*   MCV  85.4  < >  86.8  87.6   PLT  202  < >  161  159   < > = values in this interval not displayed.    CBG:  Recent Labs  Lab  10/17/12 0923  10/17/12 1134  10/17/12 1619  10/17/12 2038  10/18/12 0005  10/18/12 0416   GLUCAP  85  83  95  108*  104*  89       Signed: Ky Barban, MD 10/19/2012, 9:28 AM   Time Spent on Discharge: 45 minutes Services Ordered on Discharge: Home Health PT/OT, RN, nurse aide Equipment Ordered on Discharge: None

## 2012-10-19 NOTE — Progress Notes (Signed)
Patient ZO:XWRUEAV BRENA WINDSOR      DOB: 04-Sep-1924      WUJ:[id number]   Palliative Medicine Team at [facility] Progress Note    Subjective: Patient more alert,admits to feeling much better, eating well, having flatulence, last BM 10/18/12. Looking forward to getting back home.   Filed Vitals:   10/19/12 0500  BP: 151/73  Pulse: 59  Temp: 97.9 F (36.6 C)  Resp: 16   Physical exam: General: More alert and verbally responsive, eating well, feels better HEENT: buccal mucosa moist, n/g out CHEST: CTA bilaterally CVS: bradycardic ABD: remains distended, BS hypoactive, no pain upon palpation GU: voiding NEURO: alert, oriented    Assessment and plan:  Code Status: DNR/DNI  No symptom management needs  Education: Patient requested list of gluten free foods-given list  Disposition: plan for possible discharge back home with daughter [name] today, with home health services   Time In Time Out Total Time Spent with Patient Total Overall Time  8:40a 9:00a 20 min 20 min    Freddie Breech, CNS-C Palliative Medicine Team [facility] Team Phone: [phone] Pager: [phone]

## 2012-10-19 NOTE — Progress Notes (Signed)
Subjective: She tolerated breakfast well this morning with no nausea, vomiting, or abdominal pain. She states that she feels much better and is ready to go home.   Objective: Vital signs in last 24 hours: Filed Vitals:   10/18/12 1400 10/18/12 2150 10/19/12 0254 10/19/12 0500  BP: 96/62 118/67  151/73  Pulse: 93 61  59  Temp: 97.5 F (36.4 C) 97.9 F (36.6 C)  97.9 F (36.6 C)  TempSrc: Oral Oral  Oral  Resp: 18 18  16   Height:      Weight:   118 lb (53.524 kg)   SpO2: 96% 97%  92%   Weight change: -3 lb (-1.361 kg)  Intake/Output Summary (Last 24 hours) at 10/19/12 0905 Last data filed at 10/19/12 0847  Gross per 24 hour  Intake    720 ml  Output    452 ml  Net    268 ml   Vitals reviewed. General: resting in bed, in NAD, more interactive today HEENT:  no scleral icterus Cardiac: RRR, no rubs, murmurs or gallops Pulm: clear to auscultation bilaterally, no wheezes, rales, or rhonchi Abd: soft, nontender, distended (much improved), hyperactive BS present Ext: warm and well perfused, no pedal edema Neuro: alert and oriented to place, cranial nerves II-XII grossly intact, strength and sensation to light touch equal in bilateral upper and lower extremities  Lab Results: Basic Metabolic Panel:  Recent Labs Lab 10/14/12 2014  10/17/12 0610 10/18/12 0500  NA  --   < > 140 138  K  --   < > 3.8 4.6  CL  --   < > 114* 113*  CO2  --   < > 21 20  GLUCOSE  --   < > 84 88  BUN  --   < > 16 11  CREATININE  --   < > 1.19* 1.11*  CALCIUM  --   < > 8.1* 7.9*  MG 1.5  --   --   --   PHOS 3.1  --   --   --   < > = values in this interval not displayed. Liver Function Tests:  Recent Labs Lab 10/17/12 0610 10/18/12 0500  AST 12 13  ALT 8 7  ALKPHOS 87 75  BILITOT 0.2* 0.2*  PROT 5.3* 4.9*  ALBUMIN 2.1* 1.9*    Recent Labs Lab 10/14/12 1645  LIPASE 24   CBC:  Recent Labs Lab 10/14/12 1645  10/17/12 0610 10/18/12 0500  WBC 7.5  < > 4.3 3.8*  NEUTROABS  6.3  --   --   --   HGB 12.4  < > 10.7* 10.3*  HCT 36.8  < > 33.6* 31.9*  MCV 85.4  < > 86.8 87.6  PLT 202  < > 161 159  < > = values in this interval not displayed. No results found for this basename: DDIMER,  in the last 168 hours CBG:  Recent Labs Lab 10/17/12 0923 10/17/12 1134 10/17/12 1619 10/17/12 2038 10/18/12 0005 10/18/12 0416  GLUCAP 85 83 95 108* 104* 89   Urinalysis:  Recent Labs Lab 10/15/12 0324  COLORURINE YELLOW  LABSPEC 1.042*  PHURINE 5.5  GLUCOSEU NEGATIVE  HGBUR TRACE*  BILIRUBINUR NEGATIVE  KETONESUR NEGATIVE  PROTEINUR NEGATIVE  UROBILINOGEN 0.2  NITRITE NEGATIVE  LEUKOCYTESUR MODERATE*    Micro Results: Recent Results (from the past 240 hour(s))  URINE CULTURE     Status: None   Collection Time    10/15/12  3:24 AM  Result Value Range Status   Specimen Description URINE, RANDOM   Final   Special Requests CX ADDED AT 0406 ON 161096   Final   Culture  Setup Time 10/15/2012 04:52   Final   Colony Count >=100,000 COLONIES/ML   Final   Culture KLEBSIELLA PNEUMONIAE   Final   Report Status 10/17/2012 FINAL   Final   Organism ID, Bacteria KLEBSIELLA PNEUMONIAE   Final  CLOSTRIDIUM DIFFICILE BY PCR     Status: None   Collection Time    10/17/12  6:20 PM      Result Value Range Status   C difficile by pcr NEGATIVE  NEGATIVE Final   Medications: I have reviewed the patient's current medications. Scheduled Meds: . heparin  5,000 Units Subcutaneous Q8H  . hydrocortisone  10 mg Oral Q0600  . hydrocortisone  5 mg Oral QPM  . paricalcitol  1 mcg Oral Q M,W,F  . sertraline  50 mg Oral Daily  . sodium chloride  3 mL Intravenous Q12H   Continuous Infusions:  PRN Meds:.menthol-cetylpyridinium, ondansetron (ZOFRAN) IV, polyvinyl alcohol, zolpidem Assessment/Plan:  Bowel ileus: This is a recurrent problem likely related to celiac disease and noncompliance to gluten free diet. She does have UTI that could have contributed to her ileus. Doing  well and having BMs and gas without the NG and tolerated diet.  - Continue mechanical soft low residue diet  - Zofran prn  - Resume oral meds   - Palliative care consult for symptoms management and planning options  - Appreciate Spiritual/Pastoral care for assistance with HPOA forms   UTI. UA with pyuria. Her daughter reports that Ms. Depree has had increased urinary frequency prior to her presentation. This could be a contributing factor to her ileus. Urine culture with Klebsiella Pneumoniae sensitive to Rocephin and cipro.  -Continue Rocephin IV, pt to be discharged home with ciprofloxacin for 3 more days for a total of 5 days of antibiotic therapy.   Chronic Diarrhea: Likely related to gluten intolerance and poor compliance to gluten free diet, s/p partial colectomy secondary to colon cancer (in presumed remission). She denies recent antibiotic or PPI use with C.diff negative PCR recently. She was evaluated by Dr Loreta Ave with endoscopy in 11/2009 which was negative. Prior she had elevated TTG IgG of 93. Gluten intolerance was based on clinical symptoms and since then she has been on GF free with poor compliance as the patient loves to eat bread and candy.  - C.diff PCR Negative  Adrenal insuffiency: Dx in Nov 2012. Pt is followed by Dr Sharl Ma of endocrinology. She takes Hydrocortisone 10 mg in a.m and 5 mg in p.m. She has no clinical suspicion for acute adrenal crisis.  -Switch back to home regimen, PO  CKD stage 2: Stable creatine level.   Depression: She takes Zoloft which was restarted yesterda.   Dementia. Moderate to severe Alzheimer's with steady decline per her daughter/caregiver. GOC with Palliative, please see their notes.   Failure to thrive. Recent weight loss and albumin of 2.8, likely secondary to progressive dementia, chronic diarrhea.  .  Code Status: Brief goals of care meeting on 6/19 with her and her two caregivers daughters, [name] (HPOA), and Madeira with decision for DNR/DNI.     DVT ppx: Heparin TID.   Dispo: Disposition is deferred at this time, awaiting improvement of current medical problems.  Anticipated discharge in approximately today.   The patient does have a current PCP (Lars Mage, MD) and does need an [facility]  hospital follow-up appointment after discharge.  The patient does not have transportation limitations that hinder transportation to clinic appointments.  .Services Needed at time of discharge: Y = Yes, Blank = No PT: Y  OT:   RN:   Equipment:   Other:     LOS: 5 days   Ky Barban, MD 10/19/2012, 9:05 AM

## 2012-10-19 NOTE — Progress Notes (Signed)
D/c orders received;IV removed with gauze on, pt remains in stable condition, pt meds and instructions reviewed and given to pt; pt d/c to home with [facility] services

## 2012-10-20 NOTE — Telephone Encounter (Signed)
Patient needs to be seen in office.

## 2012-10-21 ENCOUNTER — Telehealth: Payer: Self-pay | Admitting: *Deleted

## 2012-10-21 NOTE — Telephone Encounter (Signed)
Will send form to Dr Rogelia Boga to resume all home health services before admission. Will send forms to [facility]. [name] Khailee Mick RN 10/21/12 3:15PM

## 2012-10-22 NOTE — Telephone Encounter (Signed)
I will be in [facility] tomorrow and will look for form

## 2012-10-22 NOTE — Discharge Summary (Signed)
Internal Medicine Teaching Service Attending Note Date: 10/22/2012  Patient name: Rachel Bridges  Medical record number: [id number]  Date of birth: [birth year]    I evaluated the patient on the day of discharge. She looked much improved clinically, and was able to eat her regular diet. Vitals were stable, and she denied any pain. Her abdominal distension had progressively decreased during her hospital stay, and she denied any pain abdomen, nausea or vomiting. I discussed the discharge plan with my resident team. I agree with the discharge documentation and disposition by Dr. Garald Braver.     Thanks Aletta Edouard 10/22/2012, 5:30 PM

## 2012-10-26 ENCOUNTER — Ambulatory Visit: Payer: Medicare Other | Admitting: Internal Medicine

## 2012-10-27 NOTE — Consult Note (Signed)
I have reviewed and discussed the care of this patient in detail with the nurse practitioner including pertinent patient records, physical exam findings and data. I agree with details of this encounter.  

## 2012-10-28 ENCOUNTER — Telehealth: Payer: Self-pay | Admitting: *Deleted

## 2012-10-28 NOTE — Telephone Encounter (Signed)
Call from [place], PT with [name] [phone] Pt is home after hospital stay for chronic ileus, he wants VO to continue home physical therapy.  Will this be okay?

## 2012-10-28 NOTE — Telephone Encounter (Signed)
I think home health PT is a good idea. Also, she needs to be seen in

## 2012-10-28 NOTE — Telephone Encounter (Signed)
Physical Therapist wants to continue home health PT but needs a Verbal Order.   I will give you order for this.

## 2012-10-29 NOTE — Telephone Encounter (Signed)
Sounds good. She also has an appt next week in clinic.

## 2012-11-05 ENCOUNTER — Ambulatory Visit (INDEPENDENT_AMBULATORY_CARE_PROVIDER_SITE_OTHER): Payer: Medicare Other | Admitting: Internal Medicine

## 2012-11-05 ENCOUNTER — Encounter: Payer: Self-pay | Admitting: Internal Medicine

## 2012-11-05 ENCOUNTER — Encounter: Payer: Medicare Other | Admitting: Internal Medicine

## 2012-11-05 VITALS — BP 99/62 | HR 68 | Temp 97.4°F | Ht 67.0 in | Wt 115.5 lb

## 2012-11-05 DIAGNOSIS — K9041 Non-celiac gluten sensitivity: Secondary | ICD-10-CM

## 2012-11-05 DIAGNOSIS — B372 Candidiasis of skin and nail: Secondary | ICD-10-CM

## 2012-11-05 DIAGNOSIS — G4700 Insomnia, unspecified: Secondary | ICD-10-CM

## 2012-11-05 DIAGNOSIS — K900 Celiac disease: Secondary | ICD-10-CM

## 2012-11-05 DIAGNOSIS — N182 Chronic kidney disease, stage 2 (mild): Secondary | ICD-10-CM

## 2012-11-05 DIAGNOSIS — K560 Paralytic ileus: Secondary | ICD-10-CM

## 2012-11-05 DIAGNOSIS — K567 Ileus, unspecified: Secondary | ICD-10-CM

## 2012-11-05 MED ORDER — NYSTATIN 100000 UNIT/GM EX POWD: CUTANEOUS | Status: DC

## 2012-11-05 MED ORDER — ZOLPIDEM TARTRATE 5 MG PO TABS: 2.5000 mg | ORAL_TABLET | Freq: Every evening | ORAL | Status: DC | PRN

## 2012-11-05 NOTE — Assessment & Plan Note (Signed)
Per the patient's daughter she has occasional yeast infections within her skin folds during summer months was hot outside. She's been on nystatin powder in the past him and her daughter requests a prescription for nystatin powder to have on hand at home to use as needed. -Nystatin powder like to affected areas as needed

## 2012-11-05 NOTE — Patient Instructions (Addendum)
**Take 1/2 (2.5mg ) Ambien tablet each night before bed. Let's see how this does for her insomnia.  **I have referred you to the Nutritional educator. She will contact you regarding an appointment to discuss a gluten-free diet   Gluten-Free Diet Gluten is a protein found in many grains. Gluten is present in wheat, rye, and barley. Gluten from wheat, rye, and barley protein interferes with the absorption of food in people with gluten sensitivity. It may also cause intestinal injury when eaten by individuals with gluten sensitivity.  A sample piece (biopsy) of the small intestine is usually required for a positive diagnosis of gluten sensitivity. Dietary treatment consists of eliminating foods and food ingredients from wheat, rye, and barley. When these are taken out of the diet completely, most people regain function of the small intestine. Strict compliance is important even during symptom-free periods. People with gluten sensitivity need to be on a gluten-free diet for a lifetime. During the first stages of treatment, some people will also need to restrict dairy products that contain lactose, which is a naturally occurring sugar. Lactose is difficult to absorb when the small intestines are damaged (lactose intolerance).  WHO NEEDS THIS DIET Some people who have certain diseases need to be on a gluten-free diet. These diseases include:  Celiac disease.  Nontropical sprue.  Gluten-sensitive enteropathy.  Dermatitis herpetiformis. SPECIAL NOTES  Read all labels because gluten may have been added as an incidental ingredient. Words to check for on the label include: flour, starch, durum flour, graham flour, phosphated flour, self-rising flour, semolina, farina, modified food starch, cereal, thickening, fillers, emulsifiers, any kind of malt flavoring, and hydrolyzed vegetable protein. A registered dietician can help you identify possible harmful ingredients in the foods you normally eat.  If you  are not sure whether an ingredient contains gluten, check with the manufacturer. Note that some manufacturers may change ingredients without notice. Always read labels.  Since flour and cereal products are often used in the preparation of foods, it is important to be aware of the methods of preparation used, as well as the foods themselves. This is especially true when you are dining out. Starches  Allowed: Only those prepared from arrowroot, corn, potato, rice, and bean flours. Rice wafers(*), pure cornmeal tortillas, popcorn, some crackers, and chips(*). Hot cereals made from cornmeal. Ask your dietician which specific hot and cold cereals are allowed. White or sweet potatoes, yams, hominy, rice or wild rice, and special gluten-free pasta. Some oriental rice noodles or bean noodles.  Avoid: All wheat and rye cereals, wheat germ, barley, bran, graham, malt, bulgur, and millet(-). NOTE: Avoid cereals containing malt as a flavoring, such as rice cereal. Regular noodles, spaghetti, macaroni, and most packaged rice mixes(*). All others containing wheat, rye, or barley. Vegetables  Allowed: All plain, fresh, frozen, or canned vegetables.  Avoid: Creamed vegetables(*) and vegetables canned in sauces(*). Any prepared with wheat, rye, or barley. Fruit  Allowed: All fresh, frozen, canned, or dried fruits. Fruit juices.  Avoid: Thickened or prepared fruits and some pie fillings(*). Meat and Meat Substitutes  Allowed: Meat, fish, poultry, or eggs prepared without added wheat, rye, or barley. Luncheon meat(*), frankfurters(*), and pure meat. All aged cheese and processed cheese products(*). Cottage cheese(+) and cream cheese(+). Dried beans, dried peas, and lentils.  Avoid: Any meat or meat alternate containing wheat, rye, barley, or gluten stabilizers. Bread-containing products, such as Swiss steak, croquettes, and meatloaf. Tuna canned in vegetable broth(*); [place] with HVP injected as part of the  basting; any cheese product containing oat gum as an ingredient. Milk  Allowed: Milk. Yogurt made with allowed ingredients(*).  Avoid: Commercial chocolate milk which may have cereal added(*). Malted milk. Soups and Combination Foods  Allowed: Homemade broth and soups made with allowed ingredients; some canned or frozen soups are allowed(*). Combination or prepared foods that do not contain gluten(*). Read labels.  Avoid: All soups containing wheat, rye, or barley flour. Bouillon and bouillon cubes that contain hydrolyzed vegetable protein (HVP). Combination or prepared foods that contain gluten(*). Desserts  Allowed:  Custard, some pudding mixes(*), homemade puddings from cornstarch, rice, and tapioca. Gelatin desserts, ices, and sherbet(*). Cake, cookies, and other desserts prepared with allowed flours. Some commercial ice creams(*). Ask your dietician about specific brands of dessert that are allowed.  Avoid: Cakes, cookies, doughnuts, and pastries that are prepared with wheat, rye, or barley flour. Some commercial ice creams(*), ice cream flavors which contain cookies, crumbs, or cheesecake(*). Ice cream cones. All commercially prepared mixes for cakes, cookies, and other desserts(*). Bread pudding and other puddings thickened with flour. Sweets  Allowed: Sugar, honey, syrup(*), molasses, jelly, jam, plain hard candy, marshmallows, gumdrops, homemade candies free from wheat, rye, or barley. Coconut.  Avoid: Commercial candies containing wheat, rye, or barley(*). Certain buttercrunch toffees are dusted with wheat flour. Ask your dietician about specific brands that are not allowed. Chocolate-coated nuts, which are often rolled in flour. Fats and Oils  Allowed: Butter, margarine, vegetable oil, sour cream(+), whipping cream, shortening, lard, cream, mayonnaise(*). Some commercial salad dressings(*). Peanut butter.  Avoid: Some commercial salad dressings(*). Beverages  Allowed: Coffee  (regular or decaffeinated), tea, herbal tea (read label to be sure that no wheat flour has been added). Carbonated beverages and some root beers(*). Wine, sake, and distilled spirits, such as gin, vodka, and whiskey.  Avoid:  Certain cereal beverages. Ask your dietician about specific brands that are not allowed. Beer (unless gluten-free), ale, malted milk, and some root beers, wine, and sake. Condiments/ Miscellaneous  Allowed: Salt, pepper, herbs, spices, extracts, and food colorings. Monosodium glutamate (MSG). Cider, rice, and wine vinegar. Baking soda and baking powder. Certain soy sauces. Ask your dietician about specific brands that are allowed. Nuts, coconut, chocolate, and pure cocoa powder.  Avoid: Some curry powder(*), some dry seasoning mixes(*), some gravy extracts(*), some meat sauces(*), some catsup(*), some prepared mustard(*), horseradish(*), some soy sauce(*), chip dips(*), and some chewing gum(*). Yeast extract (contains barley). Caramel color (may contain malt). Ask your dietician about specific brands of condiments to avoid. Flour and Thickening Agents  Allowed: Arrowroot starch (A); Corn bran (B); Corn flour (B,C,D); Corn germ (B); Cornmeal (B,C,D); Corn starch (A); Potato flour (B,C,E); Potato starch flour (B,C,E); Rice bran (B); Rice flours: Plain, brown (B,C,D,E), and Sweet (A,B,C,F). Rice polish (B,C,G); Soy flour (B,C,G); Tapioca starch (A). The flour and thickening agents described above are good for: (A) Good thickening agent (B) Good when combined with other flours (C) Best combined with milk and eggs in baked products (D) Best in grainy-textured products (E) Produces drier product than other flours (F) Produces moister product than other flours (G) Adds distinct flavor to product. Use in moderation. (*) Check labels and investigate any questionable ingredients.  (-) Additional research is needed before this product can be recommended. (+) Check vegetable gum  used. SAMPLE MEAL PLAN Breakfast   Orange juice.  Banana.  Rice or corn cereal.  Toast (gluten-free bread).  Heart-healthy tub margarine.  Jam.  Milk.  Coffee or tea. Lunch  Chicken salad sandwich (with gluten-free bread and mayonnaise).  Sliced tomatoes.  Heart-healthy tub margarine.  Apple.  Milk.  Coffee or tea. Dinner  [place].  Baked potato.  Broccoli.  Lettuce salad with gluten-free dressing.  Gluten-free bread.  Custard.  Heart-healthy margarine.  Coffee or tea. These meal plans are provided as samples. Your daily meal plans will vary. Document Released: 04/15/2005 Document Revised: 10/15/2011 Document Reviewed: 05/26/2011 [facility] Patient Information 2014 [place], [place].

## 2012-11-05 NOTE — Progress Notes (Signed)
Patient ID: Rachel Bridges, female   DOB: 01/17/1925, 79 y.o.   MRN: [id number]  Subjective:   Patient ID: Rachel Bridges female   DOB: 1924-11-13 79 y.o.   MRN: [id number]  HPI: [name] is a 79 y.o. F with PMH adrenal insufficiency (empty sella syndrome), CKD stage 2, gluten intolerance, colon cancer status post colectomy, and recurrent ileus, who present to [facility] for a hospital follow up.  She was admitted 6/18 with a recurrent ileus. She was placed on a gluten free diet and is eating fresh foods, no more canned foods. Pt and her daughter state that she is doing really well. Her diarrhea has improved.  She saw her Nephrologist, Dr. Anner Crete, and was told that she was doing well. He was concerned with her edema in her feet, and checked her protein levels which were low, but was told that low levels are normal for her age group.   Pt put on Ambien in the hospital, daughter wants to stop this due to worsening of her sun-downing. She is requesting something else for sleep.   Daughter is requesting Nystatin powder to have as needed in the warm summer months, as she states that the pt is prone to epidermal yeast infections in her skin folds.     Past Medical History  Diagnosis Date  . MGUS (monoclonal gammopathy of unknown significance)      [facility]  . Anxiety   . Depression   . Diverticulosis of colon   . Osteopenia   . Lumbar spinal stenosis   . Atrophic vaginitis   . History of tobacco abuse     at least 64 pack year. Started about age 66 and continued until about age 29.  Marland Kitchen Dermatitis   . Hip pain      History  . Actinic keratosis   . Dementia   . Adrenal suppression     central, on chronic steroids, followed by Dr. Sharl Ma of endocrinology  . Gluten intolerance     August 2011 endoscopy with biopsy negative for celiac disease. Prior to this patient had elevated TTG IgG of 93 (normal <20). Dr Loreta Ave made clinical diagnosis of gluten  intolerance based on diarrhea/symptoms from eating gluten. Needs GF diet.   Daughter tries to control diet.  . Colon cancer     History of bowel  obstruction. on treatment with Colace and MiraLax.  followed by Dr. Loreta Ave and Dr. Purnell Shoemaker  . Shortness of breath     "sometimes; can happen at any time" (10/14/2012)  . Arthritis     "probably" (10/14/2012)  . Renal cyst   . CKD (chronic kidney disease), stage II     Rachel Bridges 10/14/2012   Current Outpatient Prescriptions  Medication Sig Dispense Refill  . aspirin 81 MG tablet Take 81 mg by mouth daily.       . calcium citrate-vitamin D (CITRACAL+D) 315-200 MG-UNIT per tablet Take 1 tablet by mouth 2 (two) times daily.  60 tablet  11  . cyanocobalamin (,VITAMIN B-12,) 1000 MCG/ML injection Inject 1 mL (1,000 mcg total) into the muscle every 30 (thirty) days.  1 mL  11  . feeding supplement (ENSURE COMPLETE) LIQD Take 237 mLs by mouth daily. Vanilla.      . furosemide (LASIX) 20 MG tablet Take 1 tablet (20 mg total) by mouth daily.  45 tablet  5  . guaifenesin (ROBITUSSIN) 100 MG/5ML syrup Take 5 mLs (100 mg total) by  mouth every 4 (four) hours as needed for congestion.  120 mL  0  . hydrocortisone (CORTEF) 10 MG tablet Take 5-10 mg by mouth 2 (two) times daily. 10 mg in the a.m. And 5 mg in the afternoon.      . ondansetron (ZOFRAN ODT) 8 MG disintegrating tablet Take 1 tablet (8 mg total) by mouth every 8 (eight) hours as needed for nausea.  10 tablet  0  . paricalcitol (ZEMPLAR) 1 MCG capsule Take 1 mcg by mouth every Monday, Wednesday, and Friday.      . potassium chloride (K-DUR) 10 MEQ tablet Take 20 mEq by mouth 2 (two) times daily.      . sertraline (ZOLOFT) 50 MG tablet Take 1 tablet (50 mg total) by mouth daily.  30 tablet  5  . simethicone (MYLICON) 80 MG chewable tablet Chew 160 mg by mouth daily as needed. For flatulence      . acetaminophen (TYLENOL) 325 MG tablet Take 325 mg by mouth daily as needed. For pain      . nystatin  (MYCOSTATIN/NYSTOP) 100000 UNIT/GM POWD Apply to affected skin as needed for yeast infection in skin folds.  1 Bottle  0  . zolpidem (AMBIEN) 5 MG tablet Take 0.5 tablets (2.5 mg total) by mouth at bedtime as needed for sleep.  30 tablet  2   No current facility-administered medications for this visit.   No family history on file. History   Social History  . Marital Status: Widowed    Spouse Name: N/A    Number of Children: N/A  . Years of Education: N/A   Social History Main Topics  . Smoking status: Former Smoker -- 1.00 packs/day for 47 years    Types: Cigarettes    Quit date: 08/01/1988  . Smokeless tobacco: Never Used  . Alcohol Use: Yes     Comment: 10/14/2012 "used to drink; none in years; ;never had problem w/it"  . Drug Use: No  . Sexually Active: No   Other Topics Concern  . None   Social History Narrative    Husband died of myeloma.  Patient lives with daughter and son in Social worker, with [facility]: [facility] services.   Review of Systems: Constitutional: Denies fever, chills, diaphoresis, appetite change and fatigue.  HEENT: Denies eye pain, redness, ear pain, congestion, sore throat, rhinorrhea, sneezing, mouth sores, trouble swallowing, neck pain, neck stiffness and tinnitus.   Respiratory: Denies SOB, DOE, cough, chest tightness,  and wheezing.   Cardiovascular: Denies chest pain, palpitations or leg swelling.  Gastrointestinal: +diarrhea and nausea occasionally. Denies vomiting, abdominal pain, constipation, blood in stool, or abdominal distention.  Genitourinary: Denies dysuria, frequency, hematuria, flank pain and difficulty urinating.  Endocrine: Denies: hot or cold intolerance, sweats, changes in hair or nails, polyuria, polydipsia. Musculoskeletal: +difficutly ambulating, requires a rolling walker. Denies myalgias, back pain, joint swelling, arthralgias Skin: +thin skin with skin tears and bruising easily Neurological: Denies dizziness, seizures, syncope,  light-headedness, numbness or headaches.  Psychiatric/Behavioral: Denies mood changes. +confusion- unchanged from baseline  Objective:  Physical Exam: Filed Vitals:   11/05/12 1406  BP: 99/62  Pulse: 68  Temp: 97.4 F (36.3 C)  TempSrc: Oral  Height: 5\' 7"  (1.702 m)  Weight: 115 lb 8 oz (52.39 kg)  SpO2: 97%   Constitutional: Vital signs reviewed.  Patient is a well-developed and well-nourished female in no acute distress and cooperative with exam. Alert and oriented x3.  Head: Normocephalic and atraumatic Mouth: no erythema or exudates,  MMM Eyes: PERRL, EOMI, conjunctivae normal, No scleral icterus.  Neck: Supple, Trachea midline, normal ROM Cardiovascular: RRR, S1 normal, S2 normal, no MRG, pulses symmetric and intact bilaterally Pulmonary/Chest: normal respiratory effort, CTAB, no wheezes, rales, or rhonchi Abdominal: Soft. Non-tender, non-distended, bowel sounds are normal, no masses, organomegaly, or guarding present.  Musculoskeletal: No joint deformities, erythema, or ecchymoses. Nonpitting edema at ankles bilaterally with TTP in left ankle.  Neurological: A&O x4, cranial nerve II-XII are grossly intact, no focal motor deficit Skin: Skin appears thin with scattered ecchymoses. Large, new skin tear on right forearm- appears clean and uninfected, bandage over it with dried sanguinous drainage. Not actively bleeding. Psychiatric: Normal mood and affect. Speech and behavior is normal.   Assessment & Plan:  Please refer to Problem List based Assessment and Plan

## 2012-11-05 NOTE — Assessment & Plan Note (Signed)
Her ileus is resolved. No further symptoms. Patient states she's feeling very good. On exam abdomen soft nontender nondistended.

## 2012-11-05 NOTE — Assessment & Plan Note (Signed)
She started on Ambien 5 mg each bedtime on the hospital in June. Per her daughter this does help her sleep at night but then she has confusion in the morning. She's requesting a different medication for sleep. Will try 2.5 mg of Ambien each bedtime, and see if her confusion improves. -Ambien 2.5 mg by mouth each bedtime

## 2012-11-05 NOTE — Assessment & Plan Note (Signed)
Labs from 6/22 with Cr 1.11 and GFR 44. Pt is doing well, with good uop. Denies any dysuria or problems urinating. She was recently seen by her Nephrologist, Dr. Anner Crete who was pleased with how well she is doing.

## 2012-11-05 NOTE — Assessment & Plan Note (Addendum)
She has been tolerating a gluten free diet and is eating all fresh foods. Her daughter states that she is still having trouble getting her mother to eat meat. She'll chew up but then spits it out because she says it tastes bad. Her daughter is requesting to meet with nutritionist.   Her diarrhea has been very well controlled per patient and her daughter. She did have one episode of significant diarrhea the other night, and her daughter's not sure what the cause of that was. Patient is not experience any further episodes of diarrhea. She denies any abdominal pain or constipation.   -Referral was placed for nutritional counseling with [facility].

## 2012-11-06 NOTE — Progress Notes (Signed)
Case discussed with Dr. Glenn at the time of the visit.  We reviewed the resident's history and exam and pertinent patient test results.  I agree with the assessment, diagnosis, and plan of care documented in the resident's note.   

## 2012-11-10 ENCOUNTER — Telehealth: Payer: Self-pay | Admitting: *Deleted

## 2012-11-11 ENCOUNTER — Telehealth: Payer: Self-pay | Admitting: *Deleted

## 2012-11-11 NOTE — Telephone Encounter (Signed)
ERROR

## 2012-11-11 NOTE — Telephone Encounter (Signed)
Call from Minerva Areola, Physical Therapist with [name] - # [phone] Called to request VO to continue PT, in home,  twice a week for 3 more weeks. He is working on ambulation and balance.  VO given, will you approve?

## 2012-11-12 ENCOUNTER — Emergency Department (HOSPITAL_COMMUNITY): Payer: Medicare Other

## 2012-11-12 ENCOUNTER — Encounter (HOSPITAL_COMMUNITY): Payer: Self-pay | Admitting: *Deleted

## 2012-11-12 ENCOUNTER — Inpatient Hospital Stay (HOSPITAL_COMMUNITY)
Admission: EM | Admit: 2012-11-12 | Discharge: 2012-11-17 | DRG: 469 | Disposition: A | Discharge status: Skilled Nursing Facility | Payer: Medicare Other | Attending: Internal Medicine | Admitting: Internal Medicine

## 2012-11-12 DIAGNOSIS — Z9849 Cataract extraction status, unspecified eye: Secondary | ICD-10-CM

## 2012-11-12 DIAGNOSIS — E2740 Unspecified adrenocortical insufficiency: Secondary | ICD-10-CM

## 2012-11-12 DIAGNOSIS — D472 Monoclonal gammopathy: Secondary | ICD-10-CM | POA: Diagnosis present

## 2012-11-12 DIAGNOSIS — D62 Acute posthemorrhagic anemia: Secondary | ICD-10-CM | POA: Diagnosis not present

## 2012-11-12 DIAGNOSIS — Z87891 Personal history of nicotine dependence: Secondary | ICD-10-CM

## 2012-11-12 DIAGNOSIS — K900 Celiac disease: Secondary | ICD-10-CM | POA: Diagnosis present

## 2012-11-12 DIAGNOSIS — Z7901 Long term (current) use of anticoagulants: Secondary | ICD-10-CM

## 2012-11-12 DIAGNOSIS — Z9089 Acquired absence of other organs: Secondary | ICD-10-CM

## 2012-11-12 DIAGNOSIS — K5730 Diverticulosis of large intestine without perforation or abscess without bleeding: Secondary | ICD-10-CM | POA: Diagnosis present

## 2012-11-12 DIAGNOSIS — M810 Age-related osteoporosis without current pathological fracture: Secondary | ICD-10-CM | POA: Diagnosis present

## 2012-11-12 DIAGNOSIS — Z79899 Other long term (current) drug therapy: Secondary | ICD-10-CM

## 2012-11-12 DIAGNOSIS — Z7982 Long term (current) use of aspirin: Secondary | ICD-10-CM

## 2012-11-12 DIAGNOSIS — M949 Disorder of cartilage, unspecified: Secondary | ICD-10-CM | POA: Diagnosis present

## 2012-11-12 DIAGNOSIS — S72012A Unspecified intracapsular fracture of left femur, initial encounter for closed fracture: Secondary | ICD-10-CM | POA: Diagnosis present

## 2012-11-12 DIAGNOSIS — W010XXA Fall on same level from slipping, tripping and stumbling without subsequent striking against object, initial encounter: Secondary | ICD-10-CM | POA: Diagnosis present

## 2012-11-12 DIAGNOSIS — Z961 Presence of intraocular lens: Secondary | ICD-10-CM

## 2012-11-12 DIAGNOSIS — F411 Generalized anxiety disorder: Secondary | ICD-10-CM | POA: Diagnosis present

## 2012-11-12 DIAGNOSIS — S72002S Fracture of unspecified part of neck of left femur, sequela: Secondary | ICD-10-CM

## 2012-11-12 DIAGNOSIS — E43 Unspecified severe protein-calorie malnutrition: Secondary | ICD-10-CM | POA: Diagnosis present

## 2012-11-12 DIAGNOSIS — D649 Anemia, unspecified: Secondary | ICD-10-CM | POA: Diagnosis not present

## 2012-11-12 DIAGNOSIS — Z66 Do not resuscitate: Secondary | ICD-10-CM | POA: Diagnosis present

## 2012-11-12 DIAGNOSIS — E2749 Other adrenocortical insufficiency: Secondary | ICD-10-CM | POA: Diagnosis present

## 2012-11-12 DIAGNOSIS — M899 Disorder of bone, unspecified: Secondary | ICD-10-CM | POA: Diagnosis present

## 2012-11-12 DIAGNOSIS — F0390 Unspecified dementia without behavioral disturbance: Secondary | ICD-10-CM | POA: Diagnosis present

## 2012-11-12 DIAGNOSIS — D539 Nutritional anemia, unspecified: Secondary | ICD-10-CM

## 2012-11-12 DIAGNOSIS — N182 Chronic kidney disease, stage 2 (mild): Secondary | ICD-10-CM

## 2012-11-12 DIAGNOSIS — Z96659 Presence of unspecified artificial knee joint: Secondary | ICD-10-CM

## 2012-11-12 DIAGNOSIS — F329 Major depressive disorder, single episode, unspecified: Secondary | ICD-10-CM | POA: Diagnosis present

## 2012-11-12 DIAGNOSIS — Y92009 Unspecified place in unspecified non-institutional (private) residence as the place of occurrence of the external cause: Secondary | ICD-10-CM

## 2012-11-12 DIAGNOSIS — S72033A Displaced midcervical fracture of unspecified femur, initial encounter for closed fracture: Principal | ICD-10-CM | POA: Diagnosis present

## 2012-11-12 DIAGNOSIS — S72002A Fracture of unspecified part of neck of left femur, initial encounter for closed fracture: Secondary | ICD-10-CM

## 2012-11-12 DIAGNOSIS — K219 Gastro-esophageal reflux disease without esophagitis: Secondary | ICD-10-CM | POA: Diagnosis present

## 2012-11-12 DIAGNOSIS — R627 Adult failure to thrive: Secondary | ICD-10-CM | POA: Diagnosis present

## 2012-11-12 DIAGNOSIS — F3289 Other specified depressive episodes: Secondary | ICD-10-CM | POA: Diagnosis present

## 2012-11-12 DIAGNOSIS — Z85038 Personal history of other malignant neoplasm of large intestine: Secondary | ICD-10-CM

## 2012-11-12 DIAGNOSIS — Z981 Arthrodesis status: Secondary | ICD-10-CM

## 2012-11-12 DIAGNOSIS — K9041 Non-celiac gluten sensitivity: Secondary | ICD-10-CM | POA: Diagnosis present

## 2012-11-12 DIAGNOSIS — N183 Chronic kidney disease, stage 3 unspecified: Secondary | ICD-10-CM | POA: Diagnosis present

## 2012-11-12 HISTORY — DX: Stress incontinence (female) (male): N39.3

## 2012-11-12 HISTORY — DX: Unspecified intracapsular fracture of left femur, initial encounter for closed fracture: S72.012A

## 2012-11-12 LAB — BASIC METABOLIC PANEL
BUN: 19 mg/dL (ref 6–23)
Calcium: 8 mg/dL — ABNORMAL LOW (ref 8.4–10.5)
Creatinine, Ser: 1.23 mg/dL — ABNORMAL HIGH (ref 0.50–1.10)
GFR calc Af Amer: 45 mL/min — ABNORMAL LOW (ref >90)
GFR calc non Af Amer: 39 mL/min — ABNORMAL LOW (ref >90)

## 2012-11-12 LAB — CBC WITH DIFFERENTIAL/PLATELET
Basophils Absolute: 0 10*3/uL (ref 0.0–0.1)
Basophils Relative: 0 % (ref 0–1)
Eosinophils Absolute: 0 10*3/uL (ref 0.0–0.7)
Eosinophils Relative: 0 % (ref 0–5)
HCT: 34.4 % — ABNORMAL LOW (ref 36.0–46.0)
Hemoglobin: 11.3 g/dL — ABNORMAL LOW (ref 12.0–15.0)
MCH: 28.8 pg (ref 26.0–34.0)
MCHC: 32.8 g/dL (ref 30.0–36.0)
Monocytes Absolute: 0.6 10*3/uL (ref 0.1–1.0)
Monocytes Relative: 4 % (ref 3–12)
RDW: 14.9 % (ref 11.5–15.5)

## 2012-11-12 MED ORDER — FENTANYL CITRATE 0.05 MG/ML IJ SOLN
25.0000 ug | Freq: Once | INTRAMUSCULAR | Status: AC
  Administered 2012-11-12: 25 ug via INTRAVENOUS
  Filled 2012-11-12: qty 2

## 2012-11-12 MED ORDER — TRAMADOL HCL 50 MG PO TABS
50.0000 mg | ORAL_TABLET | Freq: Once | ORAL | Status: AC
  Administered 2012-11-12: 50 mg via ORAL
  Filled 2012-11-12: qty 1

## 2012-11-12 NOTE — H&P (Signed)
Date: 11/13/2012               Patient Name:  Rachel Bridges MRN: [id number]  DOB: Jul 31, 1924 Age / Sex: 79 y.o., female   PCP: Genelle Gather, MD         Medical Service: Internal Medicine Teaching Service         Attending Physician: Dr. Jonah Blue, DO    First Contact: Dr. Kirke Corin Pager: [phone]  Second Contact: Dr. Stacy Gardner Pager: [phone]       After Hours (After 5p/  First Contact Pager: [phone]  weekends / holidays): Second Contact Pager: [phone]   Chief Complaint: Left Hip Pain  History of Present Illness: Rachel Bridges is an 79 yo wf with PMH of adrenal insufficiency, colon cancer s/p partial colectomy, CKD stage II, osteopenia, failure to thrive, and other comorbidities.  According to her daughters, the patient was walking to the kitchen this afternoon and slipped on the hardwood floors.  She typically ambulates with a walker but failed to use her walker when she got up.  Family members were able to help her up and on the commode.  The patient immediately c/o left hip pain and was unable to bear weight on it.  The family denies any LOC or other injuries.  There was no chest pain, SOB, or palpitations.  The patient is DNR/DNI.    Meds: No current facility-administered medications for this encounter.   Current Outpatient Prescriptions  Medication Sig Dispense Refill  . acetaminophen (TYLENOL) 325 MG tablet Take 325 mg by mouth daily as needed. For pain      . aspirin 81 MG tablet Take 81 mg by mouth daily.       . calcium citrate-vitamin D (CITRACAL+D) 315-200 MG-UNIT per tablet Take 1 tablet by mouth 2 (two) times daily.  60 tablet  11  . cyanocobalamin (,VITAMIN B-12,) 1000 MCG/ML injection Inject 1 mL (1,000 mcg total) into the muscle every 30 (thirty) days.  1 mL  11  . feeding supplement (ENSURE COMPLETE) LIQD Take 237 mLs by mouth daily. Vanilla.      . furosemide (LASIX) 20 MG tablet Take 1 tablet (20 mg total) by mouth daily.  45 tablet  5  .  guaifenesin (ROBITUSSIN) 100 MG/5ML syrup Take 5 mLs (100 mg total) by mouth every 4 (four) hours as needed for congestion.  120 mL  0  . hydrocortisone (CORTEF) 10 MG tablet Take 5-10 mg by mouth 2 (two) times daily. 10 mg in the a.m. And 5 mg in the afternoon.      . nystatin (MYCOSTATIN/NYSTOP) 100000 UNIT/GM POWD Apply to affected skin as needed for yeast infection in skin folds.  1 Bottle  0  . ondansetron (ZOFRAN ODT) 8 MG disintegrating tablet Take 1 tablet (8 mg total) by mouth every 8 (eight) hours as needed for nausea.  10 tablet  0  . paricalcitol (ZEMPLAR) 1 MCG capsule Take 1 mcg by mouth every Monday, Wednesday, and Friday.      . potassium chloride (K-DUR) 10 MEQ tablet Take 20 mEq by mouth 2 (two) times daily.      . sertraline (ZOLOFT) 50 MG tablet Take 1 tablet (50 mg total) by mouth daily.  30 tablet  5  . simethicone (MYLICON) 80 MG chewable tablet Chew 160 mg by mouth daily as needed. For flatulence      . zolpidem (AMBIEN) 5 MG tablet Take 0.5 tablets (2.5 mg total) by  mouth at bedtime as needed for sleep.  30 tablet  2    Allergies: Allergies as of 11/12/2012 - Review Complete 11/12/2012  Allergen Reaction Noted  . Oxycodone hcl Other (See Comments) 03/02/2011  . Morphine and related Other (See Comments) 03/02/2011  . Pantoprazole (pantoprazole sodium) Nausea Only 03/02/2011   Past Medical History  Diagnosis Date  . MGUS (monoclonal gammopathy of unknown significance)      [facility]  . Anxiety   . Depression   . Diverticulosis of colon   . Osteopenia   . Lumbar spinal stenosis   . Atrophic vaginitis   . History of tobacco abuse     at least 64 pack year. Started about age 4 and continued until about age 15.  Marland Kitchen Dermatitis   . Hip pain      History  . Actinic keratosis   . Dementia   . Adrenal suppression     central, on chronic steroids, followed by Dr. Sharl Ma of endocrinology  . Gluten intolerance     August 2011  endoscopy with biopsy negative for celiac disease. Prior to this patient had elevated TTG IgG of 93 (normal <20). Dr Loreta Ave made clinical diagnosis of gluten intolerance based on diarrhea/symptoms from eating gluten. Needs GF diet.   Daughter tries to control diet.  . Colon cancer     History of bowel  obstruction. on treatment with Colace and MiraLax.  followed by Dr. Loreta Ave and Dr. Purnell Shoemaker  . Shortness of breath     "sometimes; can happen at any time" (10/14/2012)  . Arthritis     "probably" (10/14/2012)  . Renal cyst   . CKD (chronic kidney disease), stage II     /notes 10/14/2012   Past Surgical History  Procedure Laterality Date  . Hemicolectomy       Right-sided  . Ovarian cyst removal    . Cataract extraction w/ intraocular lens  implant, bilateral Bilateral   . Knee arthroscopy Left   . Appendectomy    . Carpal tunnel release Left   . Lumbar laminectomy    . Lumbar fusion    . Replacement total knee Left   . Tonsillectomy  1930's   No family history on file. History   Social History  . Marital Status: Widowed    Spouse Name: N/A    Number of Children: N/A  . Years of Education: N/A   Occupational History  . Not on file.   Social History Main Topics  . Smoking status: Former Smoker -- 1.00 packs/day for 47 years    Types: Cigarettes    Quit date: 08/01/1988  . Smokeless tobacco: Never Used  . Alcohol Use: Yes     Comment: 10/14/2012 "used to drink; none in years; ;never had problem w/it"  . Drug Use: No  . Sexually Active: No   Other Topics Concern  . Not on file   Social History Narrative    Husband died of myeloma.  Patient lives with daughter and son in Social worker, with [facility] services.    Review of Systems: Pertinent items are noted in HPI.  Physical Exam: Blood pressure 112/66, pulse 73, temperature 98.4 F (36.9 C), temperature source Oral, resp. rate 17, height 5' 7.25" (1.708 m), weight 51.256 kg (113 lb), SpO2 95.00%.  General: resting in  bed in NAD;  HEENT: PERRL, EOMI Cardiac: RRR, no rubs, murmurs or gallops Pulm: clear to auscultation bilaterally, Abd: soft, nontender, nondistended,  BS present Ext: warm and well perfused, no pedal edema; pt. lying on left side because more comfortable Neuro: alert and oriented X3, cranial nerves II-XII grossly intact   Lab results: Basic Metabolic Panel:  Recent Labs  16/10/96 2148  NA 145  K 3.8  CL 109  CO2 26  GLUCOSE 93  BUN 19  CREATININE 1.23*  CALCIUM 8.0*   CBC:  Recent Labs  11/12/12 2148  WBC 14.3*  NEUTROABS 12.5*  HGB 11.3*  HCT 34.4*  MCV 87.8  PLT 259   Urinalysis:  Recent Labs  11/13/12 0031  COLORURINE YELLOW  LABSPEC 1.010  PHURINE 5.0  GLUCOSEU NEGATIVE  HGBUR NEGATIVE  BILIRUBINUR NEGATIVE  KETONESUR NEGATIVE  PROTEINUR NEGATIVE  UROBILINOGEN 0.2  NITRITE NEGATIVE  LEUKOCYTESUR NEGATIVE   Imaging results:  Dg Hip Complete Left  11/12/2012   *RADIOLOGY REPORT*  Clinical Data: Left groin pain.  LEFT HIP - COMPLETE 2+ VIEW  Comparison: CT 10/14/2012  Findings: Again seen is marked gaseous distention of bowel loops throughout the abdomen and pelvis.  Pedicle screw and rod fixation in the lumbar spine.  There is a displaced fracture of the proximal left femur.  This is a subcapital fracture with superior displacement of the distal fragment.  Left femoral head is located.  IMPRESSION: Displaced left subcapital hip fracture.   Original Report Authenticated By: Richarda Overlie, M.D.   Dg Knee Complete 4 Views Left  11/12/2012   *RADIOLOGY REPORT*  Clinical Data: Unable to straighten the left knee.  LEFT KNEE - COMPLETE 4+ VIEW  Comparison: None.  Findings: Status post left total knee arthroplasty.  The femoral and tibial components appear to be well situated.  No acute fracture or dislocation is noted.  The knee is in flexion.  IMPRESSION: Status post left total knee arthroplasty.  No acute fracture or dislocation is noted.   Original Report  Authenticated By: Lupita Raider.,  M.D.    Assessment & Plan:  1. Left femoral neck fracture: Pt had mechanical fall; no syncope. Pt has been weak in her left leg since having knee replacement in June.  XR showed left femoral neck fracture.  The ED physician spoke with the patient, family, and Dr. Merlyn Albert (orthopedic surgery) regarding possible surgery tomorrow.    -medsurg  -fentanyl q2h prn pain (HCPOA and pt refusing morphine) -IVF 75cc/hr -NPO MDN   -according CHEST 2012 VTE ppx in orthopedic surgery: For patients undergoing major orthopedic surgery (THA, TKA, HFS) and receiving LMWH as thromboprophylaxis, we recommend starting either 12 h or more preoperatively or 12 h or more postoperatively rather than within 4 h or less preoperatively or 4 h or less postoperatively (Grade 1B). Chest. 2012;141(2_suppl):e278S-e325S. doi:10.1378/chest.02-2403. Therefore since pt and family have not decided if they will pursue surgery w/in the next 12hrs VTE prophylaxsis was not started but will be readdressed in the AM when family has made decision.  2. Adrenal insufficiency: Pt on hydrocortisone 10mg  in AM and 5mg  PM. Since the patient may undergo hip surgery (moderate surgical stress) the divided IV dose equivalents to hydrocortisone 50 to 75 mg are recommended on sugery day and postop day 1.  Regular dosage returns on postop day 2. Reference: [Perioperative glucocorticoid coverage. A reassessment 42 years after emergence of a problem.Ann Surg. 1994;219(4):416.]  -If patient decides to pursue surgery will follow guidelines noted above  3. Failure to thrive: Pt has poor po intake and nutritional status due to multiple comorbidities.  HCPOA moving towards palliative  interventions.   4. CKD stage 2: Stable creatine    Dispo: Disposition is deferred at this time, awaiting improvement of current medical problems. Anticipated discharge in approximately 3-4 day(s).   The patient does have a current  PCP Genelle Gather, MD) and does need an [facility] hospital follow-up appointment after discharge.  The patient does not have transportation limitations that hinder transportation to clinic appointments.  Signed: Boykin Peek, MD 11/13/2012, 3:38 AM       ROS Physical Exam  Constitutional: She is oriented to person, place, and time.  Neurological: She is alert and oriented to person, place, and time. She has normal strength. A sensory deficit (decreased LUE sensation) is present.

## 2012-11-12 NOTE — Telephone Encounter (Signed)
Yes to PT. Thanks

## 2012-11-12 NOTE — ED Notes (Signed)
Pt reports Lt groin pain after Pt reports falling like a split and  Caused groin pain.

## 2012-11-12 NOTE — ED Notes (Signed)
Clinical Social Work Department BRIEF PSYCHOSOCIAL ASSESSMENT 11/12/2012  Patient:  Rachel Bridges, Rachel Bridges     Account Number:  [id number]     Admit date:  11/12/2012  Clinical Social Worker:  Illene Silver  Date/Time:  11/12/2012 10:55 PM  Referred by:  CSW  Date Referred:  11/12/2012 Referred for  Other - See comment   Other Referral:   hip fx/dcp   Interview type:  Family Other interview type:   Patient.    PSYCHOSOCIAL DATA Living Status:  FAMILY Admitted from facility:   Level of care:   Primary support name:  Rachel Bridges Primary support relationship to patient:  CHILD, ADULT Degree of support available:   Good.  Pt lives with Rachel Bridges and her husband and her other daughter, Rachel Bridges, lives close by.  Pt has 24 hr care at home and is active with [facility] for [facility].  Rachel Bridges is primary HCPOA and Rachel Bridges is secondary.  Rachel Bridges can be reached at [phone] (c) or [phone] (h).  Rachel Bridges can be reached at [phone].    CURRENT CONCERNS Current Concerns  Other - See comment   Other Concerns:   Home vs SNF at d/c.  Family prefers to take pt home if possible.  Pt has been at [facility] in the past, but family does not want her to return there at d/c.  They would consider another SNF if necessary.    SOCIAL WORK ASSESSMENT / PLAN Unit CSW will follow for dcp.  family familiar with dcp process.   Assessment/plan status:  Psychosocial Support/Ongoing Assessment of Needs Other assessment/ plan:   Information/referral to community resources:    PATIENT'S/FAMILY'S RESPONSE TO PLAN OF CARE: pt anxious about needing to go a SNF again, she prefers to go home.  Dtrs. understanding ofthe need for CSW interview. Emotional support offered.

## 2012-11-12 NOTE — ED Provider Notes (Signed)
Medical screening examination/treatment/procedure(s) were performed by non-physician practitioner and as supervising physician I was immediately available for consultation/collaboration.  Toy Baker, MD 11/12/12 2322

## 2012-11-12 NOTE — ED Provider Notes (Signed)
History    CSN: [id number] Arrival date & time 11/12/12  1907  First MD Initiated Contact with Patient 11/12/12 2011     Chief Complaint  Patient presents with  . Groin Pain   HPI  History provided by patient and family. Patient is an 79 year old female with history of osteopenia, lumbar spinal stenosis, arthritis and mild dementia who presents after a fall home. Patient was walking to the kitchen slipped on the hardwood floors. Patient reports doing a splits with her left lower extremity being significantly abducted.  Patient's daughter was able to come right away. Patient did not have any significant head trauma no LOC. Family was able to help the patient up and into the bathroom where she needs to use the restroom. Afterward the patient was complaining of continued significant pain to the groin area. It was becoming more difficult to stand and walk. And was concerned and had patient brought to the emergency room. They did not give any medications or treatment further symptoms. Patient denies any other pain or injury.    Past Medical History  Diagnosis Date  . MGUS (monoclonal gammopathy of unknown significance)      [facility]  . Anxiety   . Depression   . Diverticulosis of colon   . Osteopenia   . Lumbar spinal stenosis   . Atrophic vaginitis   . History of tobacco abuse     at least 64 pack year. Started about age 60 and continued until about age 58.  Marland Kitchen Dermatitis   . Hip pain      History  . Actinic keratosis   . Dementia   . Adrenal suppression     central, on chronic steroids, followed by Dr. Sharl Ma of endocrinology  . Gluten intolerance     August 2011 endoscopy with biopsy negative for celiac disease. Prior to this patient had elevated TTG IgG of 93 (normal <20). Dr Loreta Ave made clinical diagnosis of gluten intolerance based on diarrhea/symptoms from eating gluten. Needs GF diet.   Daughter tries to control diet.  . Colon cancer    History of bowel  obstruction. on treatment with Colace and MiraLax.  followed by Dr. Loreta Ave and Dr. Purnell Shoemaker  . Shortness of breath     "sometimes; can happen at any time" (10/14/2012)  . Arthritis     "probably" (10/14/2012)  . Renal cyst   . CKD (chronic kidney disease), stage II     /notes 10/14/2012   Past Surgical History  Procedure Laterality Date  . Hemicolectomy       Right-sided  . Ovarian cyst removal    . Cataract extraction w/ intraocular lens  implant, bilateral Bilateral   . Knee arthroscopy Left   . Appendectomy    . Carpal tunnel release Left   . Lumbar laminectomy    . Lumbar fusion    . Replacement total knee Left   . Tonsillectomy  1930's   No family history on file. History  Substance Use Topics  . Smoking status: Former Smoker -- 1.00 packs/day for 47 years    Types: Cigarettes    Quit date: 08/01/1988  . Smokeless tobacco: Never Used  . Alcohol Use: Yes     Comment: 10/14/2012 "used to drink; none in years; ;never had problem w/it"   OB History   Grav Para Term Preterm Abortions TAB SAB Ect Mult Living  Review of Systems  Cardiovascular: Negative for chest pain.  Gastrointestinal: Negative for abdominal pain.  Musculoskeletal: Negative for back pain.  Neurological: Negative for syncope, weakness, numbness and headaches.  All other systems reviewed and are negative.    Allergies  Oxycodone hcl; Morphine and related; and Pantoprazole  Home Medications   Current Outpatient Rx  Name  Route  Sig  Dispense  Refill  . acetaminophen (TYLENOL) 325 MG tablet   Oral   Take 325 mg by mouth daily as needed. For pain         . aspirin 81 MG tablet   Oral   Take 81 mg by mouth daily.          . calcium citrate-vitamin D (CITRACAL+D) 315-200 MG-UNIT per tablet   Oral   Take 1 tablet by mouth 2 (two) times daily.   60 tablet   11   . cyanocobalamin (,VITAMIN B-12,) 1000 MCG/ML injection   Intramuscular   Inject 1 mL (1,000 mcg  total) into the muscle every 30 (thirty) days.   1 mL   11   . feeding supplement (ENSURE COMPLETE) LIQD   Oral   Take 237 mLs by mouth daily. Vanilla.         . furosemide (LASIX) 20 MG tablet   Oral   Take 1 tablet (20 mg total) by mouth daily.   45 tablet   5     Take 1 extra tab(total 2) lasix dose for 5 days wh ...   . guaifenesin (ROBITUSSIN) 100 MG/5ML syrup   Oral   Take 5 mLs (100 mg total) by mouth every 4 (four) hours as needed for congestion.   120 mL   0   . hydrocortisone (CORTEF) 10 MG tablet   Oral   Take 5-10 mg by mouth 2 (two) times daily. 10 mg in the a.m. And 5 mg in the afternoon.         . nystatin (MYCOSTATIN/NYSTOP) 100000 UNIT/GM POWD      Apply to affected skin as needed for yeast infection in skin folds.   1 Bottle   0   . ondansetron (ZOFRAN ODT) 8 MG disintegrating tablet   Oral   Take 1 tablet (8 mg total) by mouth every 8 (eight) hours as needed for nausea.   10 tablet   0   . paricalcitol (ZEMPLAR) 1 MCG capsule   Oral   Take 1 mcg by mouth every Monday, Wednesday, and Friday.         . potassium chloride (K-DUR) 10 MEQ tablet   Oral   Take 20 mEq by mouth 2 (two) times daily.         . sertraline (ZOLOFT) 50 MG tablet   Oral   Take 1 tablet (50 mg total) by mouth daily.   30 tablet   5   . simethicone (MYLICON) 80 MG chewable tablet   Oral   Chew 160 mg by mouth daily as needed. For flatulence         . zolpidem (AMBIEN) 5 MG tablet   Oral   Take 0.5 tablets (2.5 mg total) by mouth at bedtime as needed for sleep.   30 tablet   2    BP 118/72  Pulse 77  Temp(Src) 98.4 F (36.9 C) (Oral)  Resp 20  SpO2 94% Physical Exam  Nursing note and vitals reviewed. Constitutional: She is oriented to person, place, and time. She appears well-developed and well-nourished. No distress.  HENT:  Head: Normocephalic.  Mouth/Throat: Oropharynx is clear and moist.  Few old excoriations to right chin and cheek.  No signs  of injury.  No Battle sign or Raccoon eyes.  Neck: Normal range of motion. Neck supple.  No cervical midline tenderness  Cardiovascular: Normal rate and regular rhythm.   Pulmonary/Chest: Effort normal and breath sounds normal. No respiratory distress. She has no wheezes. She has no rales.  Abdominal: Soft. There is no tenderness. There is no rebound and no guarding.  Mild distention  Musculoskeletal: She exhibits edema and tenderness.       Left hip: She exhibits tenderness.       Thoracic back: Normal.       Lumbar back: Normal.       Legs: Moderate swelling to bilateral dorsal feet and ankles with 2+ pitting.  Normal dorsal pedal pulses. Movement equal to bilateral feet. No shortening or rotation of the left lower extremity. Moderate tenderness around the left knee. No significant swelling. No erythema. Slightly reduced range of motion of the knee otherwise full. Tenderness at the left medial groin and hip. Pain with passive range of motion. No gross deformity. No pain along the pelvis. Pelvis stable.  Neurological: She is alert and oriented to person, place, and time.  Skin: Skin is warm and dry.  Psychiatric: She has a normal mood and affect. Her behavior is normal.    ED Course  Procedures      Dg Hip Complete Left  11/12/2012   *RADIOLOGY REPORT*  Clinical Data: Left groin pain.  LEFT HIP - COMPLETE 2+ VIEW  Comparison: CT 10/14/2012  Findings: Again seen is marked gaseous distention of bowel loops throughout the abdomen and pelvis.  Pedicle screw and rod fixation in the lumbar spine.  There is a displaced fracture of the proximal left femur.  This is a subcapital fracture with superior displacement of the distal fragment.  Left femoral head is located.  IMPRESSION: Displaced left subcapital hip fracture.   Original Report Authenticated By: Richarda Overlie, M.D.   Dg Knee Complete 4 Views Left  11/12/2012   *RADIOLOGY REPORT*  Clinical Data: Unable to straighten the left knee.  LEFT KNEE  - COMPLETE 4+ VIEW  Comparison: None.  Findings: Status post left total knee arthroplasty.  The femoral and tibial components appear to be well situated.  No acute fracture or dislocation is noted.  The knee is in flexion.  IMPRESSION: Status post left total knee arthroplasty.  No acute fracture or dislocation is noted.   Original Report Authenticated By: Lupita Raider.,  M.D.      1. Hip fracture, left, closed, initial encounter       MDM  8:30PM patient seen and evaluated. Patient appears in slight discomfort. No acute distress.   9:45PM x-rays show a left femoral neck in subcapital fracture. Patient discussed with attending physician. We'll plan for admission. Basic labs ordered.  9:55 PM spoke with Dr. Merlyn Albert with orthopedic surgery. He will follow patient and see tomorrow with plans for possible surgery. Would like hospital admission.  10:43PM Spoke with Internal med resident. They will see pt and admit.  No orders at this time.  Angus Seller, PA-C 11/12/12 2248

## 2012-11-13 ENCOUNTER — Encounter (HOSPITAL_COMMUNITY)
Admission: EM | Disposition: A | Discharge status: Skilled Nursing Facility | Payer: Self-pay | Source: Home / Self Care | Attending: Internal Medicine

## 2012-11-13 ENCOUNTER — Inpatient Hospital Stay (HOSPITAL_COMMUNITY): Payer: Medicare Other

## 2012-11-13 ENCOUNTER — Encounter (HOSPITAL_COMMUNITY): Payer: Self-pay | Admitting: Anesthesiology

## 2012-11-13 ENCOUNTER — Inpatient Hospital Stay (HOSPITAL_COMMUNITY): Payer: Medicare Other | Admitting: Anesthesiology

## 2012-11-13 DIAGNOSIS — F0390 Unspecified dementia without behavioral disturbance: Secondary | ICD-10-CM

## 2012-11-13 DIAGNOSIS — E43 Unspecified severe protein-calorie malnutrition: Secondary | ICD-10-CM | POA: Diagnosis present

## 2012-11-13 DIAGNOSIS — S72002A Fracture of unspecified part of neck of left femur, initial encounter for closed fracture: Secondary | ICD-10-CM

## 2012-11-13 DIAGNOSIS — S72012A Unspecified intracapsular fracture of left femur, initial encounter for closed fracture: Secondary | ICD-10-CM

## 2012-11-13 DIAGNOSIS — N182 Chronic kidney disease, stage 2 (mild): Secondary | ICD-10-CM

## 2012-11-13 DIAGNOSIS — E2749 Other adrenocortical insufficiency: Secondary | ICD-10-CM

## 2012-11-13 DIAGNOSIS — S72009A Fracture of unspecified part of neck of unspecified femur, initial encounter for closed fracture: Secondary | ICD-10-CM

## 2012-11-13 HISTORY — PX: HIP ARTHROPLASTY: SHX981

## 2012-11-13 HISTORY — DX: Unspecified intracapsular fracture of left femur, initial encounter for closed fracture: S72.012A

## 2012-11-13 LAB — URINALYSIS, ROUTINE W REFLEX MICROSCOPIC
Bilirubin Urine: NEGATIVE
Hgb urine dipstick: NEGATIVE
Nitrite: NEGATIVE
Protein, ur: NEGATIVE mg/dL
Specific Gravity, Urine: 1.01 (ref 1.005–1.030)
Urobilinogen, UA: 0.2 mg/dL (ref 0.0–1.0)

## 2012-11-13 LAB — CBC
MCH: 28.5 pg (ref 26.0–34.0)
MCV: 87 fL (ref 78.0–100.0)
Platelets: 194 10*3/uL (ref 150–400)
RDW: 15 % (ref 11.5–15.5)

## 2012-11-13 LAB — PROTIME-INR: Prothrombin Time: 14 seconds (ref 11.6–15.2)

## 2012-11-13 LAB — SURGICAL PCR SCREEN: Staphylococcus aureus: NEGATIVE

## 2012-11-13 SURGERY — HEMIARTHROPLASTY, HIP, DIRECT ANTERIOR APPROACH, FOR FRACTURE
Anesthesia: Spinal | Laterality: Left | Wound class: Clean

## 2012-11-13 MED ORDER — PHENOL 1.4 % MT LIQD: 1.0000 | OROMUCOSAL | Status: DC | PRN

## 2012-11-13 MED ORDER — FENTANYL CITRATE 0.05 MG/ML IJ SOLN
INTRAMUSCULAR | Status: AC
  Filled 2012-11-13: qty 2

## 2012-11-13 MED ORDER — ENOXAPARIN SODIUM 40 MG/0.4ML ~~LOC~~ SOLN
40.0000 mg | SUBCUTANEOUS | Status: DC
  Administered 2012-11-14 – 2012-11-15 (×2): 40 mg via SUBCUTANEOUS
  Filled 2012-11-13 (×3): qty 0.4

## 2012-11-13 MED ORDER — ACETAMINOPHEN 325 MG PO TABS: 325.0000 mg | ORAL_TABLET | Freq: Every day | ORAL | Status: DC | PRN

## 2012-11-13 MED ORDER — HYDROCORTISONE SOD SUCCINATE 100 MG IJ SOLR
50.0000 mg | Freq: Two times a day (BID) | INTRAMUSCULAR | Status: DC
Start: 2012-11-13 — End: 2012-11-15
  Administered 2012-11-13 – 2012-11-15 (×5): 50 mg via INTRAVENOUS
  Filled 2012-11-13 (×7): qty 1

## 2012-11-13 MED ORDER — SODIUM CHLORIDE 0.9 % IR SOLN
Status: DC | PRN
  Administered 2012-11-13: 1000 mL

## 2012-11-13 MED ORDER — SERTRALINE HCL 50 MG PO TABS
50.0000 mg | ORAL_TABLET | Freq: Every day | ORAL | Status: DC
  Administered 2012-11-14 – 2012-11-17 (×4): 50 mg via ORAL
  Filled 2012-11-13 (×5): qty 1

## 2012-11-13 MED ORDER — ONDANSETRON HCL 4 MG PO TABS: 4.0000 mg | ORAL_TABLET | Freq: Four times a day (QID) | ORAL | Status: DC | PRN

## 2012-11-13 MED ORDER — HYDROCODONE-ACETAMINOPHEN 5-325 MG PO TABS
1.0000 | ORAL_TABLET | Freq: Four times a day (QID) | ORAL | Status: DC | PRN
Start: 2012-11-13 — End: 2012-11-17

## 2012-11-13 MED ORDER — ACETAMINOPHEN 325 MG PO TABS
650.0000 mg | ORAL_TABLET | Freq: Four times a day (QID) | ORAL | Status: DC | PRN
  Administered 2012-11-15 – 2012-11-16 (×2): 650 mg via ORAL
  Filled 2012-11-13 (×3): qty 2

## 2012-11-13 MED ORDER — FENTANYL CITRATE 0.05 MG/ML IJ SOLN
INTRAMUSCULAR | Status: DC | PRN
  Administered 2012-11-13 (×3): 25 ug via INTRAVENOUS

## 2012-11-13 MED ORDER — CEFAZOLIN SODIUM-DEXTROSE 2-3 GM-% IV SOLR
2.0000 g | Freq: Four times a day (QID) | INTRAVENOUS | Status: AC
  Administered 2012-11-13 – 2012-11-14 (×2): 2 g via INTRAVENOUS
  Filled 2012-11-13 (×2): qty 50

## 2012-11-13 MED ORDER — PROPOFOL INFUSION 10 MG/ML OPTIME: INTRAVENOUS | Status: DC | PRN

## 2012-11-13 MED ORDER — MAGNESIUM CITRATE PO SOLN
1.0000 | Freq: Once | ORAL | Status: AC | PRN
  Filled 2012-11-13: qty 296

## 2012-11-13 MED ORDER — BISACODYL 10 MG RE SUPP: 10.0000 mg | Freq: Every day | RECTAL | Status: DC | PRN

## 2012-11-13 MED ORDER — POLYETHYLENE GLYCOL 3350 17 G PO PACK: 17.0000 g | PACK | Freq: Every day | ORAL | Status: DC | PRN

## 2012-11-13 MED ORDER — BUPIVACAINE IN DEXTROSE 0.75-8.25 % IT SOLN
INTRATHECAL | Status: DC | PRN
  Administered 2012-11-13: 10 mg via INTRATHECAL

## 2012-11-13 MED ORDER — HYDROCORTISONE SOD SUCCINATE 100 MG IJ SOLR
INTRAMUSCULAR | Status: DC | PRN
  Administered 2012-11-13: 100 mg via INTRAVENOUS

## 2012-11-13 MED ORDER — SENNA 8.6 MG PO TABS
1.0000 | ORAL_TABLET | Freq: Two times a day (BID) | ORAL | Status: DC
  Administered 2012-11-13 – 2012-11-14 (×2): 8.6 mg via ORAL
  Filled 2012-11-13 (×9): qty 1

## 2012-11-13 MED ORDER — DOCUSATE SODIUM 100 MG PO CAPS
100.0000 mg | ORAL_CAPSULE | Freq: Two times a day (BID) | ORAL | Status: DC
  Administered 2012-11-13 – 2012-11-17 (×3): 100 mg via ORAL
  Filled 2012-11-13 (×10): qty 1

## 2012-11-13 MED ORDER — ALUM & MAG HYDROXIDE-SIMETH 200-200-20 MG/5ML PO SUSP: 30.0000 mL | ORAL | Status: DC | PRN

## 2012-11-13 MED ORDER — PROMETHAZINE HCL 25 MG/ML IJ SOLN: 6.2500 mg | INTRAMUSCULAR | Status: DC | PRN

## 2012-11-13 MED ORDER — FENTANYL CITRATE 0.05 MG/ML IJ SOLN
25.0000 ug | INTRAMUSCULAR | Status: DC | PRN
  Administered 2012-11-13 (×4): 25 ug via INTRAVENOUS
  Filled 2012-11-13 (×3): qty 2

## 2012-11-13 MED ORDER — POTASSIUM CHLORIDE IN NACL 20-0.45 MEQ/L-% IV SOLN
INTRAVENOUS | Status: DC
  Administered 2012-11-13: 22:00:00 via INTRAVENOUS
  Filled 2012-11-13 (×4): qty 1000

## 2012-11-13 MED ORDER — SENNA-DOCUSATE SODIUM 8.6-50 MG PO TABS: 2.0000 | ORAL_TABLET | Freq: Every day | ORAL | Status: DC

## 2012-11-13 MED ORDER — ONDANSETRON HCL 4 MG/2ML IJ SOLN: 4.0000 mg | Freq: Four times a day (QID) | INTRAMUSCULAR | Status: DC | PRN

## 2012-11-13 MED ORDER — ALBUMIN HUMAN 5 % IV SOLN
INTRAVENOUS | Status: DC | PRN
  Administered 2012-11-13: 16:00:00 via INTRAVENOUS

## 2012-11-13 MED ORDER — DEXTROSE 5 % IV SOLN
INTRAVENOUS | Status: DC | PRN
  Administered 2012-11-13 (×2): via INTRAVENOUS

## 2012-11-13 MED ORDER — FENTANYL CITRATE 0.05 MG/ML IJ SOLN
25.0000 ug | INTRAMUSCULAR | Status: DC | PRN
  Administered 2012-11-13: 50 ug via INTRAVENOUS

## 2012-11-13 MED ORDER — LACTATED RINGERS IV SOLN
INTRAVENOUS | Status: DC | PRN
  Administered 2012-11-13: 13:00:00 via INTRAVENOUS

## 2012-11-13 MED ORDER — FENTANYL CITRATE 0.05 MG/ML IJ SOLN
50.0000 ug | Freq: Once | INTRAMUSCULAR | Status: AC
  Administered 2012-11-13: 50 ug via INTRAVENOUS
  Filled 2012-11-13: qty 2

## 2012-11-13 MED ORDER — CEFAZOLIN SODIUM 1-5 GM-% IV SOLN
INTRAVENOUS | Status: DC | PRN
  Administered 2012-11-13 (×2): 1 g via INTRAVENOUS

## 2012-11-13 MED ORDER — MENTHOL 3 MG MT LOZG: 1.0000 | LOZENGE | OROMUCOSAL | Status: DC | PRN

## 2012-11-13 MED ORDER — ONDANSETRON 8 MG PO TBDP
8.0000 mg | ORAL_TABLET | Freq: Three times a day (TID) | ORAL | Status: DC | PRN
  Filled 2012-11-13: qty 1

## 2012-11-13 MED ORDER — SODIUM CHLORIDE 0.9 % IV SOLN: INTRAVENOUS | Status: DC

## 2012-11-13 MED ORDER — METOCLOPRAMIDE HCL 5 MG/ML IJ SOLN
5.0000 mg | Freq: Three times a day (TID) | INTRAMUSCULAR | Status: DC | PRN
Start: 2012-11-13 — End: 2012-11-17

## 2012-11-13 MED ORDER — CEFAZOLIN SODIUM-DEXTROSE 2-3 GM-% IV SOLR
2.0000 g | Freq: Once | INTRAVENOUS | Status: DC
  Filled 2012-11-13: qty 50

## 2012-11-13 MED ORDER — SODIUM CHLORIDE 0.9 % IV SOLN
10.0000 mg | INTRAVENOUS | Status: DC | PRN
  Administered 2012-11-13: 60 ug/min via INTRAVENOUS

## 2012-11-13 MED ORDER — HYDROCODONE-ACETAMINOPHEN 5-325 MG PO TABS
1.0000 | ORAL_TABLET | Freq: Four times a day (QID) | ORAL | Status: DC | PRN
  Administered 2012-11-13 – 2012-11-14 (×4): 2 via ORAL
  Filled 2012-11-13 (×3): qty 2

## 2012-11-13 MED ORDER — ENOXAPARIN SODIUM 40 MG/0.4ML ~~LOC~~ SOLN: 40.0000 mg | SUBCUTANEOUS | Status: DC

## 2012-11-13 MED ORDER — HYDROCODONE-ACETAMINOPHEN 5-325 MG PO TABS
ORAL_TABLET | ORAL | Status: AC
  Filled 2012-11-13: qty 2

## 2012-11-13 MED ORDER — PROPOFOL INFUSION 10 MG/ML OPTIME
INTRAVENOUS | Status: DC | PRN
  Administered 2012-11-13: 50 ug/kg/min via INTRAVENOUS

## 2012-11-13 MED ORDER — ACETAMINOPHEN 650 MG RE SUPP: 650.0000 mg | Freq: Four times a day (QID) | RECTAL | Status: DC | PRN

## 2012-11-13 MED ORDER — ONDANSETRON HCL 4 MG/2ML IJ SOLN
INTRAMUSCULAR | Status: DC | PRN
  Administered 2012-11-13: 4 mg via INTRAVENOUS

## 2012-11-13 MED ORDER — METOCLOPRAMIDE HCL 10 MG PO TABS: 5.0000 mg | ORAL_TABLET | Freq: Three times a day (TID) | ORAL | Status: DC | PRN

## 2012-11-13 SURGICAL SUPPLY — 60 items
BENZOIN TINCTURE PRP APPL 2/3 (GAUZE/BANDAGES/DRESSINGS) ×2 IMPLANT
BLADE SAW SGTL 18.5X63.X.64 HD (BLADE) ×2 IMPLANT
BRUSH FEMORAL CANAL (MISCELLANEOUS) ×2 IMPLANT
CAPT HIP FX BIPOLAR/UNIPOLAR ×2 IMPLANT
CEMENT BONE DEPUY (Cement) ×4 IMPLANT
CEMENT RESTRICTOR DEPUY SZ 3 (Cement) ×2 IMPLANT
CLOTH BEACON ORANGE TIMEOUT ST (SAFETY) ×2 IMPLANT
CLSR STERI-STRIP ANTIMIC 1/2X4 (GAUZE/BANDAGES/DRESSINGS) ×2 IMPLANT
COVER BACK TABLE 24X17X13 BIG (DRAPES) IMPLANT
COVER SURGICAL LIGHT HANDLE (MISCELLANEOUS) ×2 IMPLANT
DRAPE INCISE IOBAN 66X45 STRL (DRAPES) ×2 IMPLANT
DRAPE ORTHO SPLIT 77X108 STRL (DRAPES) ×2
DRAPE SURG ORHT 6 SPLT 77X108 (DRAPES) ×2 IMPLANT
DRAPE U-SHAPE 47X51 STRL (DRAPES) ×2 IMPLANT
DRILL BIT 5/64 (BIT) ×2 IMPLANT
DRSG MEPILEX BORDER 4X12 (GAUZE/BANDAGES/DRESSINGS) IMPLANT
DRSG MEPILEX BORDER 4X8 (GAUZE/BANDAGES/DRESSINGS) ×2 IMPLANT
DURAPREP 26ML APPLICATOR (WOUND CARE) ×2 IMPLANT
ELECT BLADE 6.5 EXT (BLADE) IMPLANT
ELECT CAUTERY BLADE 6.4 (BLADE) ×2 IMPLANT
ELECT REM PT RETURN 9FT ADLT (ELECTROSURGICAL) ×2
ELECTRODE REM PT RTRN 9FT ADLT (ELECTROSURGICAL) ×1 IMPLANT
FACESHIELD LNG OPTICON STERILE (SAFETY) ×8 IMPLANT
GLOVE BIOGEL PI IND STRL 6.5 (GLOVE) ×2 IMPLANT
GLOVE BIOGEL PI INDICATOR 6.5 (GLOVE) ×2
GLOVE BIOGEL PI ORTHO PRO SZ8 (GLOVE) ×2
GLOVE ECLIPSE 6.5 STRL STRAW (GLOVE) ×2 IMPLANT
GLOVE ORTHO TXT STRL SZ7.5 (GLOVE) ×4 IMPLANT
GLOVE PI ORTHO PRO STRL SZ8 (GLOVE) ×2 IMPLANT
GLOVE SURG ORTHO 8.0 STRL STRW (GLOVE) ×4 IMPLANT
GOWN STRL NON-REIN LRG LVL3 (GOWN DISPOSABLE) ×6 IMPLANT
GOWN STRL REIN 2XL LVL4 (GOWN DISPOSABLE) ×2 IMPLANT
HANDPIECE INTERPULSE COAX TIP (DISPOSABLE) ×1
KIT BASIN OR (CUSTOM PROCEDURE TRAY) ×2 IMPLANT
KIT ROOM TURNOVER OR (KITS) ×2 IMPLANT
MANIFOLD NEPTUNE II (INSTRUMENTS) ×2 IMPLANT
NEEDLE 22X1 1/2 (OR ONLY) (NEEDLE) ×2 IMPLANT
NS IRRIG 1000ML POUR BTL (IV SOLUTION) ×2 IMPLANT
PACK TOTAL JOINT (CUSTOM PROCEDURE TRAY) ×2 IMPLANT
PAD ARMBOARD 7.5X6 YLW CONV (MISCELLANEOUS) ×4 IMPLANT
PASSER SUT SWANSON 36MM LOOP (INSTRUMENTS) IMPLANT
PILLOW ABDUCTION HIP (SOFTGOODS) ×2 IMPLANT
PRESSURIZER FEMORAL UNIV (MISCELLANEOUS) ×2 IMPLANT
RETRIEVER SUT HEWSON (MISCELLANEOUS) ×2 IMPLANT
SET HNDPC FAN SPRY TIP SCT (DISPOSABLE) ×1 IMPLANT
STRIP CLOSURE SKIN 1/2X4 (GAUZE/BANDAGES/DRESSINGS) ×2 IMPLANT
SUT FIBERWIRE #2 38 REV NDL BL (SUTURE) ×6
SUT MNCRL AB 4-0 PS2 18 (SUTURE) ×2 IMPLANT
SUT VIC AB 0 CT1 27 (SUTURE) ×1
SUT VIC AB 0 CT1 27XBRD ANBCTR (SUTURE) ×1 IMPLANT
SUT VIC AB 1 CT1 27 (SUTURE)
SUT VIC AB 1 CT1 27XBRD ANBCTR (SUTURE) IMPLANT
SUT VIC AB 3-0 SH 8-18 (SUTURE) ×2 IMPLANT
SUTURE FIBERWR#2 38 REV NDL BL (SUTURE) ×3 IMPLANT
SYR CONTROL 10ML LL (SYRINGE) ×2 IMPLANT
TOWEL OR 17X24 6PK STRL BLUE (TOWEL DISPOSABLE) ×2 IMPLANT
TOWEL OR 17X26 10 PK STRL BLUE (TOWEL DISPOSABLE) ×2 IMPLANT
TOWER CARTRIDGE SMART MIX (DISPOSABLE) ×2 IMPLANT
TRAY FOLEY CATH 16FRSI W/METER (SET/KITS/TRAYS/PACK) IMPLANT
WATER STERILE IRR 1000ML POUR (IV SOLUTION) ×2 IMPLANT

## 2012-11-13 NOTE — Transfer of Care (Signed)
Immediate Anesthesia Transfer of Care Note  Patient: Rachel Bridges  Procedure(s) Performed: Procedure(s): ARTHROPLASTY BIPOLAR HIP (Left)  Patient Location: PACU  Anesthesia Type:Spinal  Level of Consciousness: awake, alert  and patient cooperative  Airway & Oxygen Therapy: Patient Spontanous Breathing and Patient connected to face mask oxygen  Post-op Assessment: Report given to PACU RN, Post -op Vital signs reviewed and stable and Patient moving all extremities  Post vital signs: Reviewed and stable  Complications: No apparent anesthesia complications

## 2012-11-13 NOTE — Progress Notes (Signed)
Asked by dr. Thurston Hole to help in care.  Plan left hip hemi later today this afternoon around 1:30.  Full consult to follow.  Eulas Post, MD

## 2012-11-13 NOTE — Progress Notes (Signed)
Orthopedic Tech Progress Note Patient Details:  Rachel Bridges 12-29-1924 [id number] Unable to use properly  Patient ID: Rachel Bridges, female   DOB: November 20, 1924, 79 y.o.   MRN: [id number]   Rachel Bridges 11/13/2012, 8:42 PM

## 2012-11-13 NOTE — Consult Note (Signed)
ORTHOPAEDIC CONSULTATION  REQUESTING PHYSICIAN: Jonah Blue, DO  Chief Complaint: left hip pain   HPI: Rachel Bridges is a 79 y.o. female who complains of left hip pain after a mechanical fall.  Pain severe, and she is unable to ambulate, denies any other injuries. A orthopedic consultation was apparently requested, and and Dr. Thurston Hole contacted me for assistance.  Past Medical History  Diagnosis Date  . MGUS (monoclonal gammopathy of unknown significance)      [facility]  . Anxiety   . Depression   . Diverticulosis of colon   . Osteopenia   . Lumbar spinal stenosis   . Atrophic vaginitis   . History of tobacco abuse     at least 64 pack year. Started about age 27 and continued until about age 8.  Marland Kitchen Dermatitis   . Hip pain      History  . Actinic keratosis   . Dementia   . Adrenal suppression     central, on chronic steroids, followed by Dr. Sharl Ma of endocrinology  . Gluten intolerance     August 2011 endoscopy with biopsy negative for celiac disease. Prior to this patient had elevated TTG IgG of 93 (normal <20). Dr Loreta Ave made clinical diagnosis of gluten intolerance based on diarrhea/symptoms from eating gluten. Needs GF diet.   Daughter tries to control diet.  . Colon cancer     History of bowel  obstruction. on treatment with Colace and MiraLax.  followed by Dr. Loreta Ave and Dr. Purnell Shoemaker  . Shortness of breath     "sometimes; can happen at any time" (10/14/2012)  . Arthritis     "probably" (10/14/2012)  . Renal cyst   . CKD (chronic kidney disease), stage II     /notes 10/14/2012   Past Surgical History  Procedure Laterality Date  . Hemicolectomy       Right-sided  . Ovarian cyst removal    . Cataract extraction w/ intraocular lens  implant, bilateral Bilateral   . Knee arthroscopy Left   . Appendectomy    . Carpal tunnel release Left   . Lumbar laminectomy    . Lumbar fusion    . Replacement total knee Left   .  Tonsillectomy  1930's   History   Social History  . Marital Status: Widowed    Spouse Name: N/A    Number of Children: N/A  . Years of Education: N/A   Social History Main Topics  . Smoking status: Former Smoker -- 1.00 packs/day for 47 years    Types: Cigarettes    Quit date: 08/01/1988  . Smokeless tobacco: Never Used  . Alcohol Use: Yes     Comment: 10/14/2012 "used to drink; none in years; ;never had problem w/it"  . Drug Use: No  . Sexually Active: No   Other Topics Concern  . None   Social History Narrative    Husband died of myeloma.  Patient lives with daughter and son in Social [place], with [facility] services.   No family history on file. Allergies  Allergen Reactions  . Oxycodone Hcl Other (See Comments)    Hallucinations   . Morphine And Related Other (See Comments)    delirium  . Pantoprazole (Pantoprazole Sodium) Nausea Only   Prior to Admission medications   Medication Sig Start Date End Date Taking? Authorizing Provider  acetaminophen (TYLENOL) 325 MG tablet Take 325 mg by mouth daily as needed. For pain   Yes Historical  Provider, MD  aspirin 81 MG tablet Take 81 mg by mouth daily.    Yes Historical Provider, MD  calcium citrate-vitamin D (CITRACAL+D) 315-200 MG-UNIT per tablet Take 1 tablet by mouth 2 (two) times daily. 02/12/12 02/11/13 Yes Linward Headland, MD  cyanocobalamin (,VITAMIN B-12,) 1000 MCG/ML injection Inject 1 mL (1,000 mcg total) into the muscle every 30 (thirty) days. 06/15/12  Yes Lars Mage, MD  feeding supplement (ENSURE COMPLETE) LIQD Take 237 mLs by mouth daily. Vanilla. 12/28/11  Yes Ky Barban, MD  furosemide (LASIX) 20 MG tablet Take 1 tablet (20 mg total) by mouth daily. 06/15/12  Yes Lars Mage, MD  guaifenesin (ROBITUSSIN) 100 MG/5ML syrup Take 5 mLs (100 mg total) by mouth every 4 (four) hours as needed for congestion. 09/28/12  Yes Christen Bame, MD  hydrocortisone (CORTEF) 10 MG tablet Take 5-10 mg by mouth 2 (two) times daily. 10  mg in the a.m. And 5 mg in the afternoon.   Yes Historical Provider, MD  nystatin (MYCOSTATIN/NYSTOP) 100000 UNIT/GM POWD Apply to affected skin as needed for yeast infection in skin folds. 11/05/12  Yes Genelle Gather, MD  ondansetron (ZOFRAN ODT) 8 MG disintegrating tablet Take 1 tablet (8 mg total) by mouth every 8 (eight) hours as needed for nausea. 09/02/12  Yes Lyanne Co, MD  paricalcitol [facility]) 1 MCG capsule Take 1 mcg by mouth every Monday, Wednesday, and Friday.   Yes Historical Provider, MD  potassium chloride (K-DUR) 10 MEQ tablet Take 20 mEq by mouth 2 (two) times daily.   Yes Historical Provider, MD  sertraline (ZOLOFT) 50 MG tablet Take 1 tablet (50 mg total) by mouth daily. 06/15/12  Yes Lars Mage, MD  simethicone (MYLICON) 80 MG chewable tablet Chew 160 mg by mouth daily as needed. For flatulence   Yes Historical Provider, MD  zolpidem (AMBIEN) 5 MG tablet Take 0.5 tablets (2.5 mg total) by mouth at bedtime as needed for sleep. 11/05/12  Yes Genelle Gather, MD   Dg Hip Complete Left  11/12/2012   *RADIOLOGY REPORT*  Clinical Data: Left groin pain.  LEFT HIP - COMPLETE 2+ VIEW  Comparison: CT 10/14/2012  Findings: Again seen is marked gaseous distention of bowel loops throughout the abdomen and pelvis.  Pedicle screw and rod fixation in the lumbar spine.  There is a displaced fracture of the proximal left femur.  This is a subcapital fracture with superior displacement of the distal fragment.  Left femoral head is located.  IMPRESSION: Displaced left subcapital hip fracture.   Original Report Authenticated By: Richarda Overlie, M.D.   Dg Chest Portable 1 View  11/13/2012   *RADIOLOGY REPORT*  Clinical Data: Left femoral neck fracture, preoperative evaluation, history smoking, carcinoma of the colon  PORTABLE CHEST - 1 VIEW  Comparison: Portable exam 1106 hours compared to 09/23/2012  Findings: Upper normal heart size. Mediastinal contours and pulmonary vascularity normal. Slight rotation  to the left. Emphysematous changes consistent with COPD. No acute infiltrate, pleural effusion or pneumothorax. Colon interposition between liver and diaphragm. Diffuse osseous demineralization.  IMPRESSION: Emphysematous changes consistent with COPD. No acute abnormalities.   Original Report Authenticated By: Ulyses Southward, M.D.   Dg Knee Complete 4 Views Left  11/12/2012   *RADIOLOGY REPORT*  Clinical Data: Unable to straighten the left knee.  LEFT KNEE - COMPLETE 4+ VIEW  Comparison: None.  Findings: Status post left total knee arthroplasty.  The femoral and tibial components appear to be well situated.  No  acute fracture or dislocation is noted.  The knee is in flexion.  IMPRESSION: Status post left total knee arthroplasty.  No acute fracture or dislocation is noted.   Original Report Authenticated By: Lupita Raider.,  M.D.    Positive ROS: All other systems have been reviewed and were otherwise negative with the exception of those mentioned in the HPI and as above.  Physical Exam: General: Alert, no acute distress Cardiovascular: No pedal edema Respiratory: No cyanosis, no use of accessory musculature GI: No organomegaly, abdomen is soft and non-tender Skin: No lesions in the area of chief complaint Neurologic: Sensation intact distally Psychiatric: Patient is competent for consent with normal mood and affect Lymphatic: No axillary or cervical lymphadenopathy  MUSCULOSKELETAL: positive log roll, unable to lift leg, ehl and fhl firing  Assessment: Left hip fracture  Plan: The risks benefits and alternatives were discussed with the patient including but not limited to the risks of nonoperative treatment, versus surgical intervention including infection, bleeding, nerve injury, periprosthetic fracture, the need for revision surgery, dislocation, leg length discrepancy, blood clots, cardiopulmonary complications, morbidity, mortality, among others, and they were willing to proceed.    To OR  today.    Eulas Post, MD Cell [phone]   11/13/2012 3:02 PM

## 2012-11-13 NOTE — H&P (Addendum)
INTERNAL MEDICINE TEACHING SERVICE Attending Admission Note  Date: 11/13/2012  Patient name: PAYDEN BONUS  Medical record number: [id number]  Date of birth: 1924-10-18    I have seen and evaluated Salvadore Oxford and discussed their care with the Residency Team.  87 yr. Old WF w/ hx adrenal insufficiency, hx colon CA s/p partial colectomy, CKD 3, osteoporosis, OA, dementia, presented due to a fall. She was walking to the kitchen without her walker and slipped on the hardwood floors. She did not complain of CP or SOB and did not have a presyncopal or syncopal episode. She immediately complained of left hip pain and was unable to bear weight. She was found to have a displaced left subcapital hip fracture on xray.   She has a TTE from 04/2011 with EF 60-65% w/o regional wall motion abnormalities. She has HfPEF.  At this time, fall is mechanical. I do not suspect underlying medical trigger. Family is interested in surgery. Risks explained. She is low risk at this time for post op cardiac complications.  Agree with change to stress dose IV steroids given his AI and upcoming orthopedic surgery.  Jonah Blue, DO 7/18/20142:28 PM

## 2012-11-13 NOTE — Anesthesia Procedure Notes (Signed)
Spinal  Patient location during procedure: OR Start time: 11/13/2012 3:00 PM End time: 11/13/2012 3:15 PM Staffing Anesthesiologist: Ester Rink TERRILL Performed by: anesthesiologist  Preanesthetic Checklist Completed: patient identified, site marked, surgical consent, pre-op evaluation, timeout performed, IV checked, risks and benefits discussed and monitors and equipment checked Spinal Block Patient position: left lateral decubitus Prep: ChloraPrep Patient monitoring: heart rate Approach: left paramedian Location: L3-4 Injection technique: single-shot Needle Needle type: Tuohy  Needle gauge: 22 G Needle length: 5 cm Needle insertion depth: 2 cm Assessment Sensory level: T6 Additional Notes Tolerated well early.

## 2012-11-13 NOTE — Anesthesia Preprocedure Evaluation (Addendum)
Anesthesia Evaluation  Patient identified by MRN, date of birth, ID band Patient awake  General Assessment Comment:Slight nonsensical talk but very conversational  Reviewed: Allergy & Precautions, H&P , NPO status , Patient's Chart, lab work & pertinent test results, reviewed documented beta blocker date and time   Airway Mallampati: II TM Distance: >3 FB Neck ROM: Full    Dental  (+) Edentulous Upper and Edentulous Lower   Pulmonary shortness of breath,  breath sounds clear to auscultation        Cardiovascular negative cardio ROS  Rhythm:Regular Rate:Normal     Neuro/Psych Anxiety Depression Dementia    GI/Hepatic GERD-  Medicated and Controlled,  Endo/Other    Renal/GU Renal InsufficiencyRenal disease     Musculoskeletal   Abdominal   Peds  Hematology  (+) Blood dyscrasia, anemia ,   Anesthesia Other Findings   Reproductive/Obstetrics                         Anesthesia Physical Anesthesia Plan  ASA: III  Anesthesia Plan: Spinal   Post-op Pain Management:    Induction:   Airway Management Planned: Natural Airway and Simple Face Mask  Additional Equipment:   Intra-op Plan:   Post-operative Plan:   Informed Consent: I have reviewed the patients History and Physical, chart, labs and discussed the procedure including the risks, benefits and alternatives for the proposed anesthesia with the patient or authorized representative who has indicated his/her understanding and acceptance.     Plan Discussed with: CRNA and Surgeon  Anesthesia Plan Comments:        Anesthesia Quick Evaluation

## 2012-11-13 NOTE — Anesthesia Postprocedure Evaluation (Signed)
Anesthesia Post Note  Patient: Rachel Bridges  Procedure(s) Performed: Procedure(s) (LRB): ARTHROPLASTY BIPOLAR HIP (Left)  Anesthesia type: General  Patient location: PACU  Post pain: Pain level controlled  Post assessment: Patient's Cardiovascular Status Stable  Last Vitals:  Filed Vitals:   11/13/12 1915  BP: 97/57  Pulse: 66  Temp:   Resp: 20    Post vital signs: Reviewed and stable  Level of consciousness: alert  Complications: No apparent anesthesia complications

## 2012-11-13 NOTE — ED Notes (Signed)
[name] (daughter) medical POA hm [phone] cell [phone] Duanne Guess (daughter) cell [phone]

## 2012-11-13 NOTE — Preoperative (Signed)
Beta Blockers   Reason not to administer Beta Blockers:Not Applicable 

## 2012-11-13 NOTE — Progress Notes (Signed)
INITIAL NUTRITION ASSESSMENT  DOCUMENTATION CODES Per approved criteria  -Severe malnutrition in the context of chronic illness -Underweight   INTERVENTION: Supplement diet as appropriate.   NUTRITION DIAGNOSIS: Malnutrition related to altered GI function as evidenced by 6% weight loss x 1 month and severe fat and muscle wasting.   Goal: Pt to meet >/= 90% of their estimated nutrition needs.   Monitor:  Diet advancement, PO intake, weight trend  Reason for Assessment: Hip Fracture  79 y.o. female  Admitting Dx: Fracture of femoral neck, left  ASSESSMENT: Pt admitted with left hip fracture, for repair this afternoon. Per H&P pt with FTT and possibly moving towards Palliative care.  Pt with documented 6% weight loss x 1 month. Per pt and daughter pt has been eating but continues to lose weight. Per daughter pt was dx with celiac dz 4-5 years ago but they were not strict until the last couple of months. Pt is always hungry. 24 hr recall reviewed: Breakfast - egg, grits, gluten free toast, OJ; Lunch- homemade chicken and rice soup; Dinner- baked fish, baked potato, broccoli; Snack- ensure. Pt continues to have diarrhea and has some diarrhea every time she urinates. Per family a recent CT showed inflamed bowel from celiac dz and is likely cause of continued diarrhea.   Nutrition Focused Physical Exam:  Subcutaneous Fat:  Orbital Region: WNL Upper Arm Region: severe wasting Thoracic and Lumbar Region: severe wasting  Muscle:  Temple Region: severe wasting Clavicle Bone Region: severe wasting Clavicle and Acromion Bone Region: severe wasting Scapular Bone Region: severe wasting Dorsal Hand: severe wasting Patellar Region: severe wasting Anterior Thigh Region: severe wasting Posterior Calf Region: severe wasting  Edema: not present   Height: Ht Readings from Last 1 Encounters:  11/12/12 5' 7.25" (1.708 m)    Weight: Wt Readings from Last 1 Encounters:  11/12/12 113 lb  (51.256 kg)    Ideal Body Weight: 61.3 kg   % Ideal Body Weight: 84%  Wt Readings from Last 10 Encounters:  11/12/12 113 lb (51.256 kg)  11/12/12 113 lb (51.256 kg)  11/05/12 115 lb 8 oz (52.39 kg)  10/19/12 118 lb (53.524 kg)  10/02/12 118 lb 1.6 oz (53.57 kg)  09/27/12 120 lb 8 oz (54.658 kg)  06/15/12 123 lb 9.6 oz (56.065 kg)  02/25/12 122 lb 1.6 oz (55.384 kg)  01/03/12 123 lb 8 oz (56.019 kg)  12/27/11 141 lb 11.2 oz (64.275 kg)    Usual Body Weight: 120 lb   % Usual Body Weight: 94%  BMI:  Body mass index is 17.57 kg/(m^2).  Estimated Nutritional Needs: Kcal: 1400-1600 Protein: 70-80 grams Fluid: > 1.5 L/day  Skin: skin tear RUE  Diet Order: NPO  EDUCATION NEEDS: -No education needs identified at this time; family declines education for Celiac dz and feel they are comfortable with the diet at this time.    Intake/Output Summary (Last 24 hours) at 11/13/12 1123 Last data filed at 11/13/12 0630  Gross per 24 hour  Intake    300 ml  Output      0 ml  Net    300 ml    Last BM: PTA   Labs:   Recent Labs Lab 11/12/12 2148  NA 145  K 3.8  CL 109  CO2 26  BUN 19  CREATININE 1.23*  CALCIUM 8.0*  GLUCOSE 93    CBG (last 3)  No results found for this basename: GLUCAP,  in the last 72 hours  Scheduled Meds: .  hydrocortisone sod succinate (SOLU-CORTEF) inj  50 mg Intravenous Q12H  . sertraline  50 mg Oral Daily    Continuous Infusions: . sodium chloride      Past Medical History  Diagnosis Date  . MGUS (monoclonal gammopathy of unknown significance)      [facility]  . Anxiety   . Depression   . Diverticulosis of colon   . Osteopenia   . Lumbar spinal stenosis   . Atrophic vaginitis   . History of tobacco abuse     at least 64 pack year. Started about age 21 and continued until about age 70.  Marland Kitchen Dermatitis   . Hip pain      History  . Actinic keratosis   . Dementia   . Adrenal suppression      central, on chronic steroids, followed by Dr. Sharl Ma of endocrinology  . Gluten intolerance     August 2011 endoscopy with biopsy negative for celiac disease. Prior to this patient had elevated TTG IgG of 93 (normal <20). Dr Loreta Ave made clinical diagnosis of gluten intolerance based on diarrhea/symptoms from eating gluten. Needs GF diet.   Daughter tries to control diet.  . Colon cancer     History of bowel  obstruction. on treatment with Colace and MiraLax.  followed by Dr. Loreta Ave and Dr. Purnell Shoemaker  . Shortness of breath     "sometimes; can happen at any time" (10/14/2012)  . Arthritis     "probably" (10/14/2012)  . Renal cyst   . CKD (chronic kidney disease), stage II     /notes 10/14/2012    Past Surgical History  Procedure Laterality Date  . Hemicolectomy       Right-sided  . Ovarian cyst removal    . Cataract extraction w/ intraocular lens  implant, bilateral Bilateral   . Knee arthroscopy Left   . Appendectomy    . Carpal tunnel release Left   . Lumbar laminectomy    . Lumbar fusion    . Replacement total knee Left   . Tonsillectomy  1930's    Kendell Bane RD, LDN, CNSC [phone] Pager [phone] After Hours Pager

## 2012-11-13 NOTE — Op Note (Signed)
11/12/2012 - 11/13/2012  4:47 PM  PATIENT:  Rachel Bridges   MRN: [id number]  PRE-OPERATIVE DIAGNOSIS:  left hip fracture  POST-OPERATIVE DIAGNOSIS:  left hip fracture  PROCEDURE:  Procedure(s): ARTHROPLASTY BIPOLAR HIP  PREOPERATIVE INDICATIONS:  Rachel Bridges is an 79 y.o. female who was admitted 11/12/2012 with a diagnosis of Fracture of femoral neck, left and elected for surgical management.  The risks benefits and alternatives were discussed with the patient including but not limited to the risks of nonoperative treatment, versus surgical intervention including infection, bleeding, nerve injury, periprosthetic fracture, the need for revision surgery, dislocation, leg length discrepancy, blood clots, cardiopulmonary complications, morbidity, mortality, among others, and they were willing to proceed.  Predicted outcome is good, although there will be at least a six to nine month expected recovery.   OPERATIVE REPORT     SURGEON:  Teryl Lucy, MD    ASSISTANT:  Margarita Rana, M.D., and second assistant was Janace Litten, OPA-C  (Present throughout the entire procedure,  necessary for completion of procedure in a timely manner, assisting with retraction, instrumentation, and closure)     ANESTHESIA:  General    COMPLICATIONS:  None.      COMPONENTS:  Depuy [place] Basic Femoral Fracture stem size 6, with a stem centralizer, a cement restrictor, with a -3 spacer and a size 46 fracture head unipolar hip ball, and a total of 2 bags of Depuy CMW 1 Bone Cement.    PROCEDURE IN DETAIL: The patient was met in the holding area and identified.  The appropriate hip  was marked at the operative site. The patient was then transported to the OR and  placed under general anesthesia.  At that point, the patient was  placed in the lateral decubitus position with the operative side up and  secured to the operating room table and all bony prominences padded.     The operative lower extremity was  prepped from the iliac crest to the toes.  Sterile draping was performed.  Time out was performed prior to incision.      A routine posterolateral approach was utilized via sharp dissection carried down to the subcutaneous tissue.  Gross bleeders were Bovie  coagulated.  The iliotibial band was identified and incised  along the length of the skin incision.  Self-retaining retractors were  inserted.  With the hip internally rotated, the short external rotators  were identified. The piriformis was tagged with FiberWire, and the hip capsule released in a T-type fashion.  The femoral neck was exposed, and I resected the femoral neck using the appropriate jig. This was performed at approximately a thumb's breadth above the lesser trochanter.    I then exposed the deep acetabulum, cleared out any tissue including the ligamentum teres, and included the hip capsule in the FiberWire used above and below the T.    I then prepared the proximal femur using the cookie-cutter, the lateralizing reamer, and then sequentially broached.  A trial utilized, and I reduced the hip and it was found to have excellent stability with functional range of motion. Initially, the first trial was too tight, and a -3 was extremely tight and it was not possible to be reduced. Therefore I went back to a size 5 broach, sunk this slightly deeper than the initial resection, and then used the calcar planer to remove some more of the neck, and then went back to the sixth broach, and trialed with the -3, and it was then found to  have excellent stability. She had extreme stiffness in both of her hips. Interestingly, the leg length felt slightly shorter than her right side, however the soft tissue tension was appropriate and she had excellent stability, so this was the configuration that was selected.. The trial components were then removed.   I then placed a cement restrictor, and cemented the real components in place. All excess cement was  removed. Once the cement had cured, I impacted the real head ball into place. The hip was then reduced and taken through functional range of motion and found to have excellent stability. Leg lengths were restored.  I then used a 2 mm drill bits to pass the FiberWire suture from the capsule and puriform is through the greater trochanter, and secured this. Excellent posterior capsular repair was achieved. I also closed the T in the capsule.  I then irrigated the hip copiously again with pulse lavage, and repaired the fascia with Vicryl, followed by Vicryl for the subcutaneous tissue, Monocryl for the skin, Steri-Strips and sterile gauze. The wounds were injected. The patient was then awakened and returned to PACU in stable and satisfactory condition. There were no complications.  Teryl Lucy, MD Orthopedic Surgeon [phone]   11/13/2012 4:47 PM

## 2012-11-13 NOTE — Progress Notes (Signed)
Subjective: Patient resting comfortably in bed, with her two daughters present. Complaints of hip pain, says she still feels pain despite the fentanyl, but family do not want morphine because she has "sun downer" when she takes it.  Objective: Vital signs in last 24 hours: Filed Vitals:   11/13/12 0337 11/13/12 0415 11/13/12 1145 11/13/12 1219  BP: 112/66 128/78 129/69 127/64  Pulse: 73 76 68 68  Temp:  98.6 F (37 C) 98.5 F (36.9 C) 97.2 F (36.2 C)  TempSrc:   Oral Oral  Resp: 17 18 16 18   Height:      Weight:      SpO2: 95% 93% 93% 95%   Weight change:   Intake/Output Summary (Last 24 hours) at 11/13/12 1601 Last data filed at 11/13/12 1555  Gross per 24 hour  Intake    350 ml  Output    200 ml  Net    150 ml   General appearance: alert, cooperative and appears stated age Head: Normocephalic, without obvious abnormality, atraumatic Lungs: clear to auscultation bilaterally Heart: regular rate and rhythm, no murmur, click, rub or gallop Abdomen: soft, non-tender; bowel sounds present. Extremities: lying of left side, examination limited by pain.  Lab Results: Basic Metabolic Panel:  Recent Labs Lab 11/12/12 2148  NA 145  K 3.8  CL 109  CO2 26  GLUCOSE 93  BUN 19  CREATININE 1.23*  CALCIUM 8.0*   CBC:  Recent Labs Lab 11/12/12 2148 11/13/12 0600  WBC 14.3* 9.7  NEUTROABS 12.5*  --   HGB 11.3* 9.9*  HCT 34.4* 30.2*  MCV 87.8 87.0  PLT 259 194   Coagulation:  Recent Labs Lab 11/13/12 0600  LABPROT 14.0  INR 1.10     Urinalysis:  Recent Labs Lab 11/13/12 0031  COLORURINE YELLOW  LABSPEC 1.010  PHURINE 5.0  GLUCOSEU NEGATIVE  HGBUR NEGATIVE  BILIRUBINUR NEGATIVE  KETONESUR NEGATIVE  PROTEINUR NEGATIVE  UROBILINOGEN 0.2  NITRITE NEGATIVE  LEUKOCYTESUR NEGATIVE    Micro Results: Recent Results (from the past 240 hour(s))  SURGICAL PCR SCREEN     Status: None   Collection Time    11/13/12 11:41 AM      Result Value Range  Status   MRSA, PCR NEGATIVE  NEGATIVE Final   Staphylococcus aureus NEGATIVE  NEGATIVE Final   Comment:            The Xpert SA Assay (FDA     approved for NASAL specimens     in patients over 12 years of age),     is one component of     a comprehensive surveillance     program.  Test performance has     been validated by [employer] for patients greater     than or equal to 56 year old.     It is not intended     to diagnose infection nor to     guide or monitor treatment.   Studies/Results: Dg Hip Complete Left  11/12/2012   *RADIOLOGY REPORT*  Clinical Data: Left groin pain.  LEFT HIP - COMPLETE 2+ VIEW  Comparison: CT 10/14/2012  Findings: Again seen is marked gaseous distention of bowel loops throughout the abdomen and pelvis.  Pedicle screw and rod fixation in the lumbar spine.  There is a displaced fracture of the proximal left femur.  This is a subcapital fracture with superior displacement of the distal fragment.  Left femoral head is located.  IMPRESSION: Displaced left subcapital hip fracture.   Original Report Authenticated By: Richarda Overlie, M.D.   Dg Chest Portable 1 View  11/13/2012   *RADIOLOGY REPORT* IMPRESSION: Emphysematous changes consistent with COPD. No acute abnormalities.   Original Report Authenticated By: Ulyses Southward, M.D.   Dg Knee Complete 4 Views Left  11/12/2012   *RADIOLOGY REPORT*  Clinical Data: Unable to straighten the left knee.  LEFT KNEE - COMPLETE 4+ VIEW  Comparison: None.  Findings: Status post left total knee arthroplasty.  The femoral and tibial components appear to be well situated.  No acute fracture or dislocation is noted.  The knee is in flexion.  IMPRESSION: Status post left total knee arthroplasty.  No acute fracture or dislocation is noted.   Original Report Authenticated By: Lupita Raider.,  M.D.   Medications: I have reviewed the patient's current medications. Scheduled Meds: . ceFAZolin  2 g Intravenous Once  . hydrocortisone sod  succinate (SOLU-CORTEF) inj  50 mg Intravenous Q12H  . sertraline  50 mg Oral Daily   Continuous Infusions: . sodium chloride     PRN Meds:.acetaminophen, fentaNYL, ondansetron (ZOFRAN) IV, ondansetron, ondansetron, sodium chloride irrigation Assessment/Plan:   # Left femoral neck fracture: X-Ray of the hip showed left subcapital hip fracture. Patient has protein energy malnutrition, and adult failure to thrive, gluten intolerance with multiple co- morbidities, increasing px susceptibility to falls. Contact made with the orthopedic surgeons who did previous knee replacement on the patient- ?1990s. -fentanyl q2h prn pain , pt refused morphine. -IVF 75cc/hr  -NPO pending surgery.   -SCDs, pending surgery, as family has agreed to surgical intervention.  # Adrenal insufficiency: Pt was previously on hydrocortisone 10mg  in AM and 5mg  PM at home.  Since the patient may undergo hip surgery (moderate surgical stress) the divided IV dose equivalents to hydrocortisone 50 to 75 mg are recommended on sugery day and postop day 1, to continue Regular dosage on postop day 2.  - IV hydrocortisone, 50mg  IV Q12H. # Failure to thrive: Pt has poor po intake and gluten intolerance with multiple comorbidities. Albumin levels has been chronically low, last 1.9- 10/18/2012.  -Recommendations from Nutritionist appreciated. # CKD stage 2: Cr on admission- 1.23. Stable. # Depression- Was on sertraline prior to admission, will resume meds after surg. # DVT ppx- SCDs.   Dispo: Disposition is deferred at this time, awaiting improvement of current medical problems.   The patient does have a current PCP Genelle Gather, MD) and does need an [facility] hospital follow-up appointment after discharge.  The patient does not have transportation limitations that hinder transportation to clinic appointments.  .Services Needed at time of discharge: Y = Yes, Blank = No PT:   OT:   RN:   Equipment:   Other:     LOS: 1 day    Kennis Carina, MD 11/13/2012, 4:01 PM

## 2012-11-14 ENCOUNTER — Encounter (HOSPITAL_COMMUNITY): Payer: Self-pay | Admitting: Physician Assistant

## 2012-11-14 DIAGNOSIS — N393 Stress incontinence (female) (male): Secondary | ICD-10-CM | POA: Insufficient documentation

## 2012-11-14 DIAGNOSIS — C189 Malignant neoplasm of colon, unspecified: Secondary | ICD-10-CM | POA: Insufficient documentation

## 2012-11-14 DIAGNOSIS — N281 Cyst of kidney, acquired: Secondary | ICD-10-CM | POA: Insufficient documentation

## 2012-11-14 DIAGNOSIS — D649 Anemia, unspecified: Secondary | ICD-10-CM | POA: Diagnosis not present

## 2012-11-14 DIAGNOSIS — D472 Monoclonal gammopathy: Secondary | ICD-10-CM | POA: Insufficient documentation

## 2012-11-14 DIAGNOSIS — E2749 Other adrenocortical insufficiency: Secondary | ICD-10-CM | POA: Insufficient documentation

## 2012-11-14 DIAGNOSIS — R0602 Shortness of breath: Secondary | ICD-10-CM | POA: Insufficient documentation

## 2012-11-14 DIAGNOSIS — M1990 Unspecified osteoarthritis, unspecified site: Secondary | ICD-10-CM | POA: Insufficient documentation

## 2012-11-14 DIAGNOSIS — M48061 Spinal stenosis, lumbar region without neurogenic claudication: Secondary | ICD-10-CM | POA: Insufficient documentation

## 2012-11-14 DIAGNOSIS — F419 Anxiety disorder, unspecified: Secondary | ICD-10-CM | POA: Insufficient documentation

## 2012-11-14 DIAGNOSIS — N182 Chronic kidney disease, stage 2 (mild): Secondary | ICD-10-CM | POA: Insufficient documentation

## 2012-11-14 LAB — BASIC METABOLIC PANEL
BUN: 18 mg/dL (ref 6–23)
Creatinine, Ser: 1.16 mg/dL — ABNORMAL HIGH (ref 0.50–1.10)
GFR calc Af Amer: 48 mL/min — ABNORMAL LOW (ref >90)
GFR calc non Af Amer: 42 mL/min — ABNORMAL LOW (ref >90)
Potassium: 3 mEq/L — ABNORMAL LOW (ref 3.5–5.1)

## 2012-11-14 LAB — CBC
HCT: 24.6 % — ABNORMAL LOW (ref 36.0–46.0)
MCHC: 32.5 g/dL (ref 30.0–36.0)
Platelets: 170 10*3/uL (ref 150–400)
RDW: 15.1 % (ref 11.5–15.5)
WBC: 8.7 10*3/uL (ref 4.0–10.5)

## 2012-11-14 MED ORDER — POTASSIUM CHLORIDE CRYS ER 20 MEQ PO TBCR
20.0000 meq | EXTENDED_RELEASE_TABLET | Freq: Two times a day (BID) | ORAL | Status: AC
  Administered 2012-11-14: 20 meq via ORAL
  Filled 2012-11-14: qty 1

## 2012-11-14 NOTE — Progress Notes (Deleted)
Subjective: 1 Day Post-Op Procedure(s) (LRB): ARTHROPLASTY BIPOLAR HIP (Left) Patient reports pain as 3 on 0-10 scale.    Objective: Vital signs in last 24 hours: Temp:  [97.5 F (36.4 C)-98.6 F (37 C)] 98.6 F (37 C) (07/19 1333) Pulse Rate:  [58-80] 80 (07/19 1333) Resp:  [14-20] 16 (07/19 1333) BP: (71-112)/(38-64) 103/50 mmHg (07/19 1333) SpO2:  [96 %-100 %] 99 % (07/19 1333)  Intake/Output from previous day: 07/18 0701 - 07/19 0700 In: 1100 [I.V.:850; IV Piggyback:250] Out: 1460 [Urine:1160; Blood:300] Intake/Output this shift: Total I/O In: 480 [P.O.:480] Out: -    Recent Labs  11/12/12 2148 11/13/12 0600 11/14/12 0530  HGB 11.3* 9.9* 8.0*    Recent Labs  11/13/12 0600 11/14/12 0530  WBC 9.7 8.7  RBC 3.47* 2.82*  HCT 30.2* 24.6*  PLT 194 170    Recent Labs  11/12/12 2148 11/14/12 0530  NA 145 142  K 3.8 3.0*  CL 109 110  CO2 26 23  BUN 19 18  CREATININE 1.23* 1.16*  GLUCOSE 93 131*  CALCIUM 8.0* 7.2*    Recent Labs  11/13/12 0600  INR 1.10    ABD soft Neurovascular intact Sensation intact distally Intact pulses distally Dorsiflexion/Plantar flexion intact Incision: moderate drainage Dressing changed by me today  Assessment/Plan: 1 Day Post-Op Procedure(s) (LRB): ARTHROPLASTY BIPOLAR HIP (Left) Advance diet Up with therapy Discharge to SNF  [facility] 11/14/2012, 4:31 PM

## 2012-11-14 NOTE — Progress Notes (Signed)
Subjective: Patient resting comfortably in bed, eating. No complaints. Feels fine, no pain.   Objective: Vital signs in last 24 hours: Filed Vitals:   11/13/12 2306 11/14/12 0000 11/14/12 0256 11/14/12 0518  BP: 71/38  82/52 89/47  Pulse: 60  59 58  Temp: 97.8 F (36.6 C)  98.1 F (36.7 C) 97.9 F (36.6 C)  TempSrc: Oral  Oral Oral  Resp:  18 18 18   Height:      Weight:      SpO2: 98% 98% 96% 97%   Weight change:   Intake/Output Summary (Last 24 hours) at 11/14/12 0844 Last data filed at 11/14/12 0700  Gross per 24 hour  Intake   1100 ml  Output   1460 ml  Net   -360 ml   General appearance: alert, cooperative, appears stated age and no distress Head: Normocephalic, without obvious abnormality, atraumatic Lungs: clear to auscultation bilaterally Heart: regular rate and rhythm, S1, S2 normal, no murmur, click, rub or gallop Abdomen: soft, non-tender; bowel sounds normal; no masses.  Lab Results: Basic Metabolic Panel:  Recent Labs Lab 11/12/12 2148 11/14/12 0530  NA 145 142  K 3.8 3.0*  CL 109 110  CO2 26 23  GLUCOSE 93 131*  BUN 19 18  CREATININE 1.23* 1.16*  CALCIUM 8.0* 7.2*   CBC:  Recent Labs Lab 11/12/12 2148 11/13/12 0600 11/14/12 0530  WBC 14.3* 9.7 8.7  NEUTROABS 12.5*  --   --   HGB 11.3* 9.9* 8.0*  HCT 34.4* 30.2* 24.6*  MCV 87.8 87.0 87.2  PLT 259 194 170   Coagulation:  Recent Labs Lab 11/13/12 0600  LABPROT 14.0  INR 1.10   Alcohol Level: No results found for this basename: ETH,  in the last 168 hours Urinalysis:  Recent Labs Lab 11/13/12 0031  COLORURINE YELLOW  LABSPEC 1.010  PHURINE 5.0  GLUCOSEU NEGATIVE  HGBUR NEGATIVE  BILIRUBINUR NEGATIVE  KETONESUR NEGATIVE  PROTEINUR NEGATIVE  UROBILINOGEN 0.2  NITRITE NEGATIVE  LEUKOCYTESUR NEGATIVE   Micro Results: Recent Results (from the past 240 hour(s))  SURGICAL PCR SCREEN     Status: None   Collection Time    11/13/12 11:41 AM      Result Value Range  Status   MRSA, PCR NEGATIVE  NEGATIVE Final   Staphylococcus aureus NEGATIVE  NEGATIVE Final   Comment:            The Xpert SA Assay (FDA     approved for NASAL specimens     in patients over 58 years of age),     is one component of     a comprehensive surveillance     program.  Test performance has     been validated by [employer] for patients greater     than or equal to 72 year old.     It is not intended     to diagnose infection nor to     guide or monitor treatment.   Studies/Results: Dg Hip Complete Left  11/12/2012   *RADIOLOGY REPORT*  Clinical Data: Left groin pain.  LEFT HIP - COMPLETE 2+ VIEW  Comparison: CT 10/14/2012  Findings: Again seen is marked gaseous distention of bowel loops throughout the abdomen and pelvis.  Pedicle screw and rod fixation in the lumbar spine.  There is a displaced fracture of the proximal left femur.  This is a subcapital fracture with superior displacement of the distal fragment.  Left femoral head  is located.  IMPRESSION: Displaced left subcapital hip fracture.   Original Report Authenticated By: Richarda Overlie, M.D.   Dg Pelvis Portable  11/13/2012   *RADIOLOGY REPORT*  Clinical Data: Left hip replacement.  PORTABLE PELVIS  Comparison: Plain films left hip 11/12/2012.  Findings: The patient has a new bipolar left hip hemiarthroplasty. The device is located.  No fracture is identified.  IMPRESSION: Left hip replacement without complication.   Original Report Authenticated By: Holley Dexter, M.D.   Dg Chest Portable 1 View  11/13/2012   *RADIOLOGY REPORT*  Clinical Data: Left femoral neck fracture, preoperative evaluation, history smoking, carcinoma of the colon  PORTABLE CHEST - 1 VIEW  Comparison: Portable exam 1106 hours compared to 09/23/2012  Findings: Upper normal heart size. Mediastinal contours and pulmonary vascularity normal. Slight rotation to the left. Emphysematous changes consistent with COPD. No acute infiltrate, pleural effusion  or pneumothorax. Colon interposition between liver and diaphragm. Diffuse osseous demineralization.  IMPRESSION: Emphysematous changes consistent with COPD. No acute abnormalities.   Original Report Authenticated By: Ulyses Southward, M.D.   Dg Hip Portable 1 View Left  11/13/2012   *RADIOLOGY REPORT*  Clinical Data: Status post hip replacement.  PORTABLE LEFT HIP - 1 VIEW  Comparison: Plain film of the left hip 11/12/2012.  Findings: Bipolar left hip hemiarthroplasty is in place.  The device appears located.  No fracture.  IMPRESSION: Left hip replacement without evidence of complication.   Original Report Authenticated By: Holley Dexter, M.D.   Dg Knee Complete 4 Views Left  11/12/2012   *RADIOLOGY REPORT*  Clinical Data: Unable to straighten the left knee.  LEFT KNEE - COMPLETE 4+ VIEW  Comparison: None.  Findings: Status post left total knee arthroplasty.  The femoral and tibial components appear to be well situated.  No acute fracture or dislocation is noted.  The knee is in flexion.  IMPRESSION: Status post left total knee arthroplasty.  No acute fracture or dislocation is noted.   Original Report Authenticated By: Lupita Raider.,  M.D.   Medications: I have reviewed the patient's current medications. Scheduled Meds: . docusate sodium  100 mg Oral BID  . enoxaparin (LOVENOX) injection  40 mg Subcutaneous Q24H  . hydrocortisone sod succinate (SOLU-CORTEF) inj  50 mg Intravenous Q12H  . senna  1 tablet Oral BID  . sertraline  50 mg Oral Daily   Continuous Infusions: . 0.45 % NaCl with KCl 20 mEq / L 75 mL/hr at 11/13/12 2222   PRN Meds:.acetaminophen, acetaminophen, alum & mag hydroxide-simeth, bisacodyl, fentaNYL, fentaNYL, HYDROcodone-acetaminophen, menthol-cetylpyridinium, metoCLOPramide (REGLAN) injection, metoCLOPramide, ondansetron (ZOFRAN) IV, ondansetron, phenol, polyethylene glycol, promethazine Assessment/Plan: Principal Problem:   Fracture of femoral neck, left Active Problems:    MONOCLONAL GAMMOPATHY   DEPRESSION   Adult failure to thrive   CKD (chronic kidney disease) stage 2, GFR 60-89 ml/min   Adrenal insufficiency   Gluten intolerance   Protein-calorie malnutrition, severe  # Fracture of the Lt  femoral Neck- Sub-capital. - Patient had Lt Bipolar hip arthroplasty- yesterday- 11/13/2012. Marland Kitchen Procedure went well and was well tolerated.    # Adrenal insufficiency: Pt was previously on hydrocortisone 10mg  in AM and 5mg  PM at home.  Since the patient may undergo hip surgery (moderate surgical stress) the divided IV dose equivalents to hydrocortisone 50 to 75 mg are recommended on sugery day and postop day 1, to continue Regular dosage on postop day 2.  - IV hydrocortisone, 50mg  IV Q12H.  # Failure to thrive: Pt has poor  po intake and gluten intolerance with multiple comorbidities. Albumin levels has been chronically low, last 1.9- 10/18/2012.  -Recommendations from Nutritionist appreciated.  # CKD stage 2: Cr on admission- 1.23. Stable. No interventions required at this time. # Depression- Was on sertraline prior to admission, will resume meds after surg.  # DVT ppx- lovenox- 40mg  [place] Q24H.     Dispo: Disposition is deferred at this time, awaiting improvement of current medical problems.   The patient does have a current PCP Genelle Gather, MD) and does need an [facility] hospital follow-up appointment after discharge.  The patient does not have transportation limitations that hinder transportation to clinic appointments.  .Services Needed at time of discharge: Y = Yes, Blank = No PT:   OT:   RN:   Equipment:   Other:     LOS: 2 days   Kennis Carina, MD 11/14/2012, 8:44 AM

## 2012-11-14 NOTE — Evaluation (Signed)
Clinical/Bedside Swallow Evaluation Patient Details  Name: Rachel Bridges MRN: [id number] Date of Birth: [birth year]  Today's Date: 11/14/2012 Time: 6256-3893 SLP Time Calculation (min): 10 min  Past Medical History:  Past Medical History  Diagnosis Date  . MGUS (monoclonal gammopathy of unknown significance)      [facility]  . Anxiety   . Depression   . Diverticulosis of colon   . Osteopenia   . Lumbar spinal stenosis   . Atrophic vaginitis   . History of tobacco abuse     at least 64 pack year. Started about age 65 and continued until about age 70.  Marland Kitchen Dermatitis   . Hip pain      History  . Actinic keratosis   . Dementia   . Adrenal suppression     central, on chronic steroids, followed by Dr. Sharl Ma of endocrinology  . Gluten intolerance     August 2011 endoscopy with biopsy negative for celiac disease. Prior to this patient had elevated TTG IgG of 93 (normal <20). Dr Loreta Ave made clinical diagnosis of gluten intolerance based on diarrhea/symptoms from eating gluten. Needs GF diet.   Daughter tries to control diet.  . Colon cancer     History of bowel  obstruction. on treatment with Colace and MiraLax.  followed by Dr. Loreta Ave and Dr. Purnell Shoemaker  . Shortness of breath     "sometimes; can happen at any time" (10/14/2012)  . Arthritis     "probably" (10/14/2012)  . Renal cyst   . CKD (chronic kidney disease), stage II     /notes 10/14/2012   Past Surgical History:  Past Surgical History  Procedure Laterality Date  . Hemicolectomy       Right-sided  . Ovarian cyst removal    . Cataract extraction w/ intraocular lens  implant, bilateral Bilateral   . Knee arthroscopy Left   . Appendectomy    . Carpal tunnel release Left   . Lumbar laminectomy    . Lumbar fusion    . Replacement total knee Left   . Tonsillectomy  1930's   HPI:  79 y.o. female admitted with left hip fracture; underwent repair 7/18.  Hx of adrenal insufficiency, colon CA  s/p partial colectomy, CKD 3, osteoporosis, OA, dementia.  Esophagram in 2005: Nonspecific esophageal motility disorder.  Per RD, pt with malnutrition related to altered GI function as evidenced by 6% weight loss x 1 month and severe fat and muscle wasting.    Assessment / Plan / Recommendation Clinical Impression  Pt presents with normal oropharyngeal swallow function with adequate mastication despite no dentition, swift swallow trigger, and no signs of impaired airway protection.  Recommend continuing current regular diet, thin liquids per orders.  No SLP f/u warranted - will sign off.           Diet Recommendation Regular;Thin liquid   Liquid Administration via: Straw;Cup Medication Administration: Whole meds with liquid Supervision: Patient able to self feed    Other  Recommendations Oral Care Recommendations: Oral care BID   Follow Up Recommendations  None     General  Type of Study: Bedside swallow evaluation Previous Swallow Assessment: none per records Diet Prior to this Study: Regular;Thin liquids Temperature Spikes Noted: No Respiratory Status: Room air Behavior/Cognition: Alert;Cooperative Oral Cavity - Dentition: Edentulous Self-Feeding Abilities: Able to feed self Patient Positioning: Upright in bed Baseline Vocal Quality: Clear Volitional Cough: Strong Volitional Swallow: Able to elicit    Oral/Motor/Sensory Function  Overall Oral Motor/Sensory Function: Appears within functional limits for tasks assessed   Ice Chips Ice chips: Within functional limits Presentation: Self Fed   Thin Liquid Thin Liquid: Within functional limits Presentation: Cup;Straw    Nectar Thick Nectar Thick Liquid: Not tested   Honey Thick Honey Thick Liquid: Not tested   Puree Puree: Within functional limits   Solid   Rachel Bridges, [place] CCC/SLP Pager [phone]     Solid: Not tested ; no gluten-free products were available during assessment.  Pt has no oral-motor deficits that would  inhibit oral phase of swallow.      Rachel Bridges 11/14/2012,10:54 AM

## 2012-11-14 NOTE — Evaluation (Signed)
Physical Therapy Evaluation Patient Details Name: Rachel Bridges MRN: [id number] DOB: [birth year] Today's Date: 11/14/2012 Time: 5784-6962 PT Time Calculation (min): 32 min  PT Assessment / Plan / Recommendation History of Present Illness    79 year old female with history of osteopenia, lumbar spinal stenosis, arthritis and mild dementia who presents after a fall home   Clinical Impression   Pt admitted with fracture of the left femoral neck- sub-capital and s/p Lt Bipolar hip arthroplasty- yesterday- 11/13/2012  resulting in functional limitations due to the deficits listed below (see PT Problem List).  Patient will benefit from skilled PT to increase their independence and safety with mobility to allow discharge to the venue listed below.  Family not present on evaluation and pt requiring increased assist and cues for safety and precautions so recommend ST-SNF at this time.         PT Assessment  Patient needs continued PT services    Follow Up Recommendations  Supervision/Assistance - 24 hour;SNF    Does the patient have the potential to tolerate intense rehabilitation      Barriers to Discharge        Equipment Recommendations  None recommended by PT (pt reports she has RW but if not will need RW)    Recommendations for Other Services     Frequency Min 3X/week    Precautions / Restrictions Precautions Precautions: Fall;Posterior Hip Precaution Comments: pt educated on posterior hip precautions, left handout in room for staff/family Restrictions LLE Weight Bearing: Weight bearing as tolerated   Pertinent Vitals/Pain Pt reports minimal pain with activity and better with rest, repositioned to comfort in recliner with pillow between legs. SaO2 high 90's at rest sitting upright and checked again after ambulation 95% room air so left off O2 and RN aware.      Mobility  Bed Mobility Bed Mobility: Supine to Sit;Rolling Right Rolling Right: 4: Min assist Supine to Sit: 3:  Mod assist Details for Bed Mobility Assistance: verbal cues for rolling for cleaning BM upon arrival, support/assist for L LE to maintain THP, assist for L LE over EOB and trunk upright Transfers Transfers: Sit to Stand;Stand to Sit Sit to Stand: 3: Mod assist;With upper extremity assist;From bed;From chair/3-in-1 Stand to Sit: With upper extremity assist;3: Mod assist;To chair/3-in-1 Details for Transfer Assistance: verbal cues for safe technique within precautions, performed twice Ambulation/Gait Ambulation/Gait Assistance: 4: Min assist Ambulation Distance (Feet): 6 Feet Assistive device: Rolling walker Ambulation/Gait Assistance Details: max multimodal cues for technique, sequence, RW distance Gait Pattern: Decreased step length - left;Decreased weight shift to left;Decreased stance time - left;Step-to pattern;Antalgic    Exercises     PT Diagnosis: Difficulty walking;Acute pain  PT Problem List: Decreased strength;Decreased activity tolerance;Decreased mobility;Decreased balance;Decreased knowledge of precautions;Decreased safety awareness;Decreased knowledge of use of DME;Pain PT Treatment Interventions: DME instruction;Gait training;Functional mobility training;Therapeutic activities;Therapeutic exercise;Patient/family education     PT Goals(Current goals can be found in the care plan section) Acute Rehab PT Goals PT Goal Formulation: With patient Time For Goal Achievement: 11/21/12 Potential to Achieve Goals: Good  Visit Information  Last PT Received On: 11/14/12 Assistance Needed: +2       Prior Functioning  Home Living Family/patient expects to be discharged to:: Private residence Living Arrangements: Children Available Help at Discharge: Family;Available 24 hours/day Type of Home: House Home Access: Stairs to enter [employer] of Steps: 3 Entrance Stairs-Rails: Right Home Layout: One level Home Equipment: Walker - 2 wheels;Cane - single point Prior  Function Level of  Independence: Needs assistance Comments: states Rachel Bridges has been coming to house to assist her Communication Communication: No difficulties    Cognition  Cognition Arousal/Alertness: Awake/alert Behavior During Therapy: WFL for tasks assessed/performed Overall Cognitive Status:  (hx dementia)    Extremity/Trunk Assessment Lower Extremity Assessment Lower Extremity Assessment: LLE deficits/detail LLE Deficits / Details: assist required for mobility, grossly 2+/5   Balance    End of Session PT - End of Session Equipment Utilized During Treatment: Gait belt Activity Tolerance: Patient limited by fatigue Patient left: in chair;with call bell/phone within reach;with family/visitor present Nurse Communication: Mobility status  GP     Rachel Bridges,Rachel Bridges 11/14/2012, 4:13 PM Rachel Bridges, PT, DPT 11/14/2012 Pager: [phone]

## 2012-11-14 NOTE — Progress Notes (Signed)
Subjective: 1 Day Post-Op Procedure(s) (LRB): ARTHROPLASTY BIPOLAR HIP (Left) Patient reports pain as 3 on 0-10 scale.    Objective: Vital signs in last 24 hours: Temp:  [97.5 F (36.4 C)-98.6 F (37 C)] 98.6 F (37 C) (07/19 1333) Pulse Rate:  [58-80] 80 (07/19 1333) Resp:  [14-20] 16 (07/19 1333) BP: (71-112)/(38-64) 103/50 mmHg (07/19 1333) SpO2:  [96 %-100 %] 99 % (07/19 1333)  Intake/Output from previous day: 07/18 0701 - 07/19 0700 In: 1100 [I.V.:850; IV Piggyback:250] Out: 1460 [Urine:1160; Blood:300] Intake/Output this shift: Total I/O In: 480 [P.O.:480] Out: -    Recent Labs  11/12/12 2148 11/13/12 0600 11/14/12 0530  HGB 11.3* 9.9* 8.0*    Recent Labs  11/13/12 0600 11/14/12 0530  WBC 9.7 8.7  RBC 3.47* 2.82*  HCT 30.2* 24.6*  PLT 194 170    Recent Labs  11/12/12 2148 11/14/12 0530  NA 145 142  K 3.8 3.0*  CL 109 110  CO2 26 23  BUN 19 18  CREATININE 1.23* 1.16*  GLUCOSE 93 131*  CALCIUM 8.0* 7.2*    Recent Labs  11/13/12 0600  INR 1.10    ABD soft Neurovascular intact Sensation intact distally Intact pulses distally Dorsiflexion/Plantar flexion intact Incision: dressing C/D/I and scant drainage  Assessment/Plan: 1 Day Post-Op Procedure(s) (LRB): ARTHROPLASTY BIPOLAR HIP (Left) Advance diet Up with therapy Nutrition consult   Abdomen is very distended will start stool softners  Rachel Bridges J 11/14/2012, 3:06 PM

## 2012-11-14 NOTE — Clinical Social Work Note (Signed)
Clinical Social Work  CSW met with pt to address discharge plan. PT is recommending SNF. Pt is agreeable to SNF placement for rehab. With pt's permission, CSW attempted to reach pt's daughter, however was unable to leave a message. CSW will initiate SNF search and follow up with bed offers. CSW will continue to follow.   Dede Query, MSW, LCSW Clinical Social Worker Weekend Coverage [phone]

## 2012-11-15 LAB — CBC
MCHC: 32.3 g/dL (ref 30.0–36.0)
RDW: 15 % (ref 11.5–15.5)

## 2012-11-15 LAB — BASIC METABOLIC PANEL
GFR calc Af Amer: 44 mL/min — ABNORMAL LOW (ref >90)
GFR calc non Af Amer: 38 mL/min — ABNORMAL LOW (ref >90)
Potassium: 3.8 mEq/L (ref 3.5–5.1)
Sodium: 142 mEq/L (ref 135–145)

## 2012-11-15 MED ORDER — ENOXAPARIN SODIUM 30 MG/0.3ML ~~LOC~~ SOLN
30.0000 mg | SUBCUTANEOUS | Status: DC
  Administered 2012-11-16 – 2012-11-17 (×2): 30 mg via SUBCUTANEOUS
  Filled 2012-11-15 (×3): qty 0.3

## 2012-11-15 MED ORDER — HYDROCORTISONE 10 MG PO TABS
10.0000 mg | ORAL_TABLET | Freq: Two times a day (BID) | ORAL | Status: DC
  Administered 2012-11-15 – 2012-11-17 (×4): 10 mg via ORAL
  Filled 2012-11-15 (×5): qty 1

## 2012-11-15 NOTE — Progress Notes (Signed)
Subjective: 2 Days Post-Op Procedure(s) (LRB): ARTHROPLASTY BIPOLAR HIP (Left) Patient reports pain as 3 on 0-10 scale.    Objective: Vital signs in last 24 hours: Temp:  [97.5 F (36.4 C)-98.7 F (37.1 C)] 97.5 F (36.4 C) (07/20 0526) Pulse Rate:  [67-80] 67 (07/20 0526) Resp:  [16] 16 (07/20 0526) BP: (101-105)/(39-62) 105/62 mmHg (07/20 0526) SpO2:  [96 %-99 %] 99 % (07/20 0526)  Intake/Output from previous day: 07/19 0701 - 07/20 0700 In: 1710 [P.O.:960; I.V.:750] Out: 800 [Urine:800] Intake/Output this shift: Total I/O In: 360 [P.O.:360] Out: 150 [Urine:150]   Recent Labs  11/12/12 2148 11/13/12 0600 11/14/12 0530 11/15/12 0530  HGB 11.3* 9.9* 8.0* 7.4*    Recent Labs  11/14/12 0530 11/15/12 0530  WBC 8.7 7.3  RBC 2.82* 2.62*  HCT 24.6* 22.9*  PLT 170 142*    Recent Labs  11/14/12 0530 11/15/12 0530  NA 142 142  K 3.0* 3.8  CL 110 111  CO2 23 24  BUN 18 24*  CREATININE 1.16* 1.25*  GLUCOSE 131* 125*  CALCIUM 7.2* 7.2*    Recent Labs  11/13/12 0600  INR 1.10    ABD soft Neurovascular intact Sensation intact distally Intact pulses distally Dorsiflexion/Plantar flexion intact Incision: scant drainage  Assessment/Plan: 2 Days Post-Op Procedure(s) (LRB): ARTHROPLASTY BIPOLAR HIP (Left) Advance diet Up with therapy Discharge to SNF tomorrow if bed available.  Please place FL2 on the chart  Rayola Everhart J 11/15/2012, 11:59 AM

## 2012-11-15 NOTE — Progress Notes (Signed)
Subjective: No complaints today. No pain, no chest pain or SOB. @ days Post- op.   Objective: Vital signs in last 24 hours: Filed Vitals:   11/15/12 0000 11/15/12 0400 11/15/12 0526 11/15/12 1431  BP:   105/62 102/49  Pulse:   67 66  Temp:   97.5 F (36.4 C) 98.6 F (37 C)  TempSrc:      Resp: 16 16 16 16   Height:      Weight:      SpO2: 96% 99% 99% 98%   Weight change:   Intake/Output Summary (Last 24 hours) at 11/15/12 1542 Last data filed at 11/15/12 1300  Gross per 24 hour  Intake   1950 ml  Output    950 ml  Net   1000 ml   General appearance: alert, cooperative, appears stated age and no distress Head: Normocephalic, without obvious abnormality, atraumatic Lungs: clear to auscultation bilaterally Heart: regular rate and rhythm, S1, S2 normal, no murmur, click, rub or gallop Abdomen: soft, non-tender; bowel sounds normal; no masses,  no organomegaly Lab Results: Basic Metabolic Panel:  Recent Labs Lab 11/14/12 0530 11/15/12 0530  NA 142 142  K 3.0* 3.8  CL 110 111  CO2 23 24  GLUCOSE 131* 125*  BUN 18 24*  CREATININE 1.16* 1.25*  CALCIUM 7.2* 7.2*   CBC:  Recent Labs Lab 11/12/12 2148  11/14/12 0530 11/15/12 0530  WBC 14.3*  < > 8.7 7.3  NEUTROABS 12.5*  --   --   --   HGB 11.3*  < > 8.0* 7.4*  HCT 34.4*  < > 24.6* 22.9*  MCV 87.8  < > 87.2 87.4  PLT 259  < > 170 142*  < > = values in this interval not displayed. Coagulation:  Recent Labs Lab 11/13/12 0600  LABPROT 14.0  INR 1.10   Urinalysis:  Recent Labs Lab 11/13/12 0031  COLORURINE YELLOW  LABSPEC 1.010  PHURINE 5.0  GLUCOSEU NEGATIVE  HGBUR NEGATIVE  BILIRUBINUR NEGATIVE  KETONESUR NEGATIVE  PROTEINUR NEGATIVE  UROBILINOGEN 0.2  NITRITE NEGATIVE  LEUKOCYTESUR NEGATIVE    Micro Results: Recent Results (from the past 240 hour(s))  SURGICAL PCR SCREEN     Status: None   Collection Time    11/13/12 11:41 AM      Result Value Range Status   MRSA, PCR NEGATIVE   NEGATIVE Final   Staphylococcus aureus NEGATIVE  NEGATIVE Final   Comment:            The Xpert SA Assay (FDA     approved for NASAL specimens     in patients over 39 years of age),     is one component of     a comprehensive surveillance     program.  Test performance has     been validated by [employer] for patients greater     than or equal to 31 year old.     It is not intended     to diagnose infection nor to     guide or monitor treatment.   Studies/Results: Dg Pelvis Portable  11/13/2012   *RADIOLOGY REPORT*  Clinical Data: Left hip replacement.  PORTABLE PELVIS  Comparison: Plain films left hip 11/12/2012.  Findings: The patient has a new bipolar left hip hemiarthroplasty. The device is located.  No fracture is identified.  IMPRESSION: Left hip replacement without complication.   Original Report Authenticated By: Holley Dexter, M.D.   Dg Hip Portable  1 View Left  11/13/2012   *RADIOLOGY REPORT*  Clinical Data: Status post hip replacement.  PORTABLE LEFT HIP - 1 VIEW  Comparison: Plain film of the left hip 11/12/2012.  Findings: Bipolar left hip hemiarthroplasty is in place.  The device appears located.  No fracture.  IMPRESSION: Left hip replacement without evidence of complication.   Original Report Authenticated By: Holley Dexter, M.D.   Medications: I have reviewed the patient's current medications. Scheduled Meds: . docusate sodium  100 mg Oral BID  . [START ON 11/16/2012] enoxaparin (LOVENOX) injection  30 mg Subcutaneous Q24H  . hydrocortisone sod succinate (SOLU-CORTEF) inj  50 mg Intravenous Q12H  . senna  1 tablet Oral BID  . sertraline  50 mg Oral Daily   Continuous Infusions:  PRN Meds:.acetaminophen, acetaminophen, alum & mag hydroxide-simeth, bisacodyl, HYDROcodone-acetaminophen, menthol-cetylpyridinium, metoCLOPramide (REGLAN) injection, metoCLOPramide, ondansetron (ZOFRAN) IV, ondansetron, phenol, polyethylene glycol,  promethazine Assessment/Plan:  # Fracture of the Lt femoral Neck- Sub-capital. - Patient had Lt Bipolar hip arthroplasty- 2 days- 11/13/2012. Marland Kitchen Procedure went well and was well tolerated.  - Ortho seen- for possible discharge tomorrow pending SNF placement. # Adrenal insufficiency: Pt was previously on hydrocortisone 10mg  in AM and 5mg  PM at home.  -To D/c stress dose of hydrocort and continue home regimen.  # Failure to thrive: Pt has poor po intake and gluten intolerance with multiple comorbidities. Albumin levels has been chronically low, last 1.9- 10/18/2012.  -Recommendations from Nutritionist appreciated.  # CKD stage 2: Cr on admission- 1.23. Stable. No interventions required at this time.  # Depression- Was on sertraline prior to admission, will resume meds after surg.  # DVT ppx- lovenox- 30mg  [place] Q24H.     Dispo: Disposition is deferred at this time, awaiting SNF placement..  Anticipated discharge in approximately 1-2 day(s).   The patient does have a current PCP Genelle Gather, MD) and does not need an [facility] hospital follow-up appointment after discharge.  The patient does not have transportation limitations that hinder transportation to clinic appointments.  .Services Needed at time of discharge: Y = Yes, Blank = No PT:   OT:   RN:   Equipment:   Other:     LOS: 3 days   Kennis Carina, MD 11/15/2012, 3:42 PM

## 2012-11-16 DIAGNOSIS — D539 Nutritional anemia, unspecified: Secondary | ICD-10-CM

## 2012-11-16 LAB — CBC
Hemoglobin: 6.9 g/dL — CL (ref 12.0–15.0)
MCHC: 33 g/dL (ref 30.0–36.0)
RBC: 2.41 MIL/uL — ABNORMAL LOW (ref 3.87–5.11)
WBC: 6.3 10*3/uL (ref 4.0–10.5)

## 2012-11-16 LAB — PREPARE RBC (CROSSMATCH)

## 2012-11-16 MED ORDER — ENSURE COMPLETE PO LIQD
237.0000 mL | Freq: Two times a day (BID) | ORAL | Status: DC
  Administered 2012-11-16 – 2012-11-17 (×2): 237 mL via ORAL

## 2012-11-16 MED ORDER — ACETAMINOPHEN 325 MG PO TABS
650.0000 mg | ORAL_TABLET | Freq: Once | ORAL | Status: AC
  Administered 2012-11-16: 650 mg via ORAL

## 2012-11-16 MED ORDER — DIPHENHYDRAMINE HCL 25 MG PO CAPS
25.0000 mg | ORAL_CAPSULE | Freq: Once | ORAL | Status: AC
  Administered 2012-11-16: 25 mg via ORAL

## 2012-11-16 NOTE — Clinical Social Work Placement (Addendum)
Clinical Social Work Department  CLINICAL SOCIAL WORK PLACEMENT NOTE  11/16/2012 Patient: Rachel Bridges  Account Number:  [id number] Admit date: 11/12/12 Clinical Social Worker: Sabino Niemann, MSW Date/time: 11/16/2012 11:30 AM  Clinical Social Work is seeking post-discharge placement for this patient at the following level of care: SKILLED NURSING (*CSW will update this form in Epic as items are completed)  11/16/2012 Patient/family provided with [facility] Department of Clinical Social Work's list of facilities offering this level of care within the geographic area requested by the patient (or if unable, by the patient's family).  11/16/2012 Patient/family informed of their freedom to choose among providers that offer the needed level of care, that participate in Medicare, Medicaid or managed care program needed by the patient, have an available bed and are willing to accept the patient.  11/16/2012 Patient/family informed of [facility]' ownership interest in [facility], as well as of the fact that they are under no obligation to receive care at this facility.  PASARR submitted to EDS on Pre-existing  PASARR number received from EDS on FL2 transmitted to all facilities in geographic area requested by pt/family on 11/16/2012 FL2 transmitted to all facilities within larger geographic area on  Patient informed that his/her managed care company has contracts with or will negotiate with certain facilities, including the following:  Patient/family informed of bed offers received: 11/17/2012 Patient chooses bed at [facility] Physician recommends and patient chooses bed at  Patient to be transferred to on 11/17/2012 Patient to be transferred to facility by [facility] The following physician request were entered in Epic:  Additional Comments:

## 2012-11-16 NOTE — Progress Notes (Signed)
  Date: 11/16/2012  Patient name: Rachel Bridges  Medical record number: [id number]  Date of birth: 06/08/1924   This patient has been seen and the plan of care was discussed with the house staff. Please see their note for complete details. I concur with their findings with the following additions/corrections:  Feels well today. No reports of bleeding. Hip surgical site is c/d/i. Distal pulses bilat LE are intact with good ROM.  No CP, no dizziness. Given Hgb value, it would be reasonable to give one unit of blood. She will need outpatient follow up to follow her anemia and further workup if necessary. I would ask orthopedic surgery regarding need for prophylactic anticoagulation s/p hip surg (lovenox, xarelto, etc). Otherwise, medically stable for D/C to facility.  Jonah Blue, DO 11/16/2012, 2:14 PM

## 2012-11-16 NOTE — Progress Notes (Signed)
Occupational Therapy Discharge Patient Details Name: LAVONA NORSWORTHY MRN: [id number] DOB: [birth year] Today's Date: 11/16/2012 Time:  -     Patient discharged from OT services secondary to pt's plan is to go to SNF. OT eval not needed for SNF Medicare. Will defer OT eval to that facility.Evette Georges [phone] 11/16/2012, 8:02 AM

## 2012-11-16 NOTE — Progress Notes (Signed)
Patient ID: Rachel Bridges, female   DOB: [birth year], 79 y.o.   MRN: [id number]     Subjective:  Patient reports pain as mild.  Patient states that her hip is doing well and denies any CP or SOB.  Alert and follows commands   Objective:   VITALS:   Filed Vitals:   11/15/12 2000 11/15/12 2205 11/16/12 0546 11/16/12 0800  BP:  86/45 128/61   Pulse:  75 69   Temp:  99 F (37.2 C) 98.4 F (36.9 C)   TempSrc:      Resp: 16 14 16 16   Height:      Weight:      SpO2: 98% 97% 95%     ABD soft Sensation intact distally Dorsiflexion/Plantar flexion intact Incision: dressing C/D/I and no drainage   Lab Results  Component Value Date   WBC 6.3 11/16/2012   HGB 6.9* 11/16/2012   HCT 20.9* 11/16/2012   MCV 86.7 11/16/2012   PLT 153 11/16/2012     Assessment/Plan: 3 Days Post-Op   Principal Problem:   Fracture of femoral neck, left Active Problems:   MONOCLONAL GAMMOPATHY   DEPRESSION   GERD   Adult failure to thrive   CKD (chronic kidney disease) stage 2, GFR 60-89 ml/min   Adrenal insufficiency   Gluten intolerance   Severe malnutrition   Protein-calorie malnutrition, severe   Subcapital fracture of left hip   Postoperative anemia due to acute blood loss   Advance diet Up with therapy Continue plan per medicine ABLA, would benefit from blood.   Haskel Khan 11/16/2012, 8:47 AM   Teryl Lucy, MD Cell [phone]

## 2012-11-16 NOTE — Progress Notes (Signed)
Physical Therapy Treatment Patient Details Name: Rachel Bridges MRN: [id number] DOB: [birth year] Today's Date: 11/16/2012 Time: 7829-5621 PT Time Calculation (min): 31 min  PT Assessment / Plan / Recommendation  PT Comments   Pt is slowly progressing toward PT with less assistance required for functional activities.  Pt is limited by fatigue and weakness. Pt will benefit from skilled PT in order to improve strength and functional ability.   Follow Up Recommendations  Supervision/Assistance - 24 hour;SNF     Does the patient have the potential to tolerate intense rehabilitation     Barriers to Discharge        Equipment Recommendations  None recommended by PT    Recommendations for Other Services    Frequency Min 3X/week   Progress towards PT Goals Progress towards PT goals: Progressing toward goals  Plan Current plan remains appropriate    Precautions / Restrictions Precautions Precautions: Fall;Posterior Hip Precaution Comments: pt educated on posterior hip precautions, left handout in room for staff/family Restrictions Weight Bearing Restrictions: Yes LLE Weight Bearing: Weight bearing as tolerated   Pertinent Vitals/Pain Pt reported pain in LLE 8/10.    Mobility  Bed Mobility Bed Mobility: Supine to Sit;Sitting - Scoot to [place] of Bed Rolling Right: 4: Min assist Sitting - Scoot to Edge of Bed: 4: Min guard Details for Bed Mobility Assistance: min assist to bring LLE to EOB. Guarding for safety. increased time and effort. Transfers Transfers: Sit to Stand;Stand to Sit Sit to Stand: 4: Min assist;With upper extremity assist;From bed Stand to Sit: 4: Min assist;With upper extremity assist;With armrests;To chair/3-in-1 Details for Transfer Assistance: min A to achieve a standing postition and safely lower into chair. VC for hand placement, sequencing, and extend trunk. Ambulation/Gait Ambulation/Gait Assistance: 4: Min assist Ambulation Distance (Feet): 5  Feet Assistive device: Rolling walker Ambulation/Gait Assistance Details: max multimodal cues for technique, sequence, RW distance Gait Pattern: Decreased step length - left;Decreased weight shift to left;Decreased stance time - left;Step-to pattern;Antalgic Gait velocity: slow Stairs: No Wheelchair Mobility Wheelchair Mobility: No    Exercises Total Joint Exercises Ankle Circles/Pumps: AROM;Both;10 reps;Supine Quad Sets: AROM;Both;10 reps;Supine Heel Slides: AROM;Both;10 reps;Supine Hip ABduction/ADduction: AROM;Both;10 reps;Supine      PT Goals (current goals can now be found in the care plan section) Acute Rehab PT Goals Time For Goal Achievement: 11/21/12 Potential to Achieve Goals: Good  Visit Information  Last PT Received On: 11/16/12 Assistance Needed: +1    Subjective Data      Cognition  Cognition Arousal/Alertness: Awake/alert Behavior During Therapy: WFL for tasks assessed/performed    Balance     End of Session PT - End of Session Equipment Utilized During Treatment: Gait belt Activity Tolerance: Patient limited by fatigue Patient left: in chair;with call bell/phone within reach;with family/visitor present Nurse Communication: Mobility status   GP     Jolyn Nap, SPTA 11/16/2012, 11:47 AM

## 2012-11-16 NOTE — Progress Notes (Signed)
CRITICAL VALUE ALERT  Critical value received: Hgb 6.9  Date of notification:  11/16/12  Time of notification:  08:15  Critical value read back:yes  Nurse who received alert:  Arnoldo Morale RN  MD notified (1st page):  Dr. Mariea Clonts  Time of first page:  08:20  MD notified (2nd page):  Time of second page:  Responding MD:  Corky Sing  Time MD responded:  08:25

## 2012-11-16 NOTE — Progress Notes (Signed)
Seen and agreed 11/16/2012 Robinette, Julia Elizabeth PTA [phone] pager [phone] office    

## 2012-11-16 NOTE — Clinical Social Work Psychosocial (Signed)
Clinical Social Work Department  BRIEF PSYCHOSOCIAL ASSESSMENT  Patient: Rachel Bridges  Account Number: [id number]   Admit date: 11/12/12 Clinical Social Worker Sabino Niemann, MSW Date/Time: 11/16/2012 11:30 AM Referred by: Physician Date Referred:  Referred for   SNF Placement   Other Referral:  Interview type: Patient and patient's family Other interview type: PSYCHOSOCIAL DATA  Living Status: Daughter Admitted from facility:  Level of care:  Primary support name: [name] Primary support relationship to patient: daughter Degree of support available:  Strong and vested  CURRENT CONCERNS  Current Concerns   Post-Acute Placement   Other Concerns:  SOCIAL WORK ASSESSMENT / PLAN  CSW met with pt re: PT recommendation for SNF.   Pt lives with her daughter  CSW explained placement process and answered questions.   Pt's family  reports [facility]  As Their preference    CSW completed FL2 and initiated SNF search.     Assessment/plan status: Information/Referral to [facility]  Other assessment/ plan:  Information/referral to community resources:  SNF     PATIENT'S/FAMILY'S RESPONSE TO PLAN OF CARE:  Pt and patient's family report They are agreeable to ST SNF in order for the patient to increase strength and independence with mobility prior to returning home  Pt verbalized understanding of placement process and appreciation for CSW assist.   Sabino Niemann, MSW [phone]

## 2012-11-16 NOTE — Progress Notes (Signed)
NUTRITION FOLLOW UP  Intervention:   Ensure Complete po BID, each supplement provides 350 kcal and 13 grams of protein.  Nutrition Dx:   Malnutrition related to altered GI function as evidenced by 6% weight loss x 1 month and severe fat and muscle wasting; ongoing.   Goal:  Pt to meet >/= 90% of their estimated nutrition needs, not met.   Monitor:  Diet advancement, PO intake, weight trend  Assessment:   Pt admitted with left hip fracture, for repair this afternoon. Per H&P pt with FTT and possibly moving towards Palliative care. Pt with hx of celiac disease.  Pt states she loves the food and is eating well. Will offer ensure.   Height: Ht Readings from Last 1 Encounters:  11/12/12 5' 7.25" (1.708 m)    Weight Status:   Wt Readings from Last 1 Encounters:  11/12/12 113 lb (51.256 kg)  No new weight  Re-estimated needs:  Kcal: 1400-1600  Protein: 70-80 grams  Fluid: > 1.5 L/day  Skin: skin tear RUE; left hip incision   Diet Order: General Meal Completion: 100%   Intake/Output Summary (Last 24 hours) at 11/16/12 1249 Last data filed at 11/15/12 2200  Gross per 24 hour  Intake    600 ml  Output      0 ml  Net    600 ml    Last BM: 7/21   Labs:   Recent Labs Lab 11/12/12 2148 11/14/12 0530 11/15/12 0530  NA 145 142 142  K 3.8 3.0* 3.8  CL 109 110 111  CO2 26 23 24   BUN 19 18 24*  CREATININE 1.23* 1.16* 1.25*  CALCIUM 8.0* 7.2* 7.2*  GLUCOSE 93 131* 125*    CBG (last 3)  No results found for this basename: GLUCAP,  in the last 72 hours  Scheduled Meds: . docusate sodium  100 mg Oral BID  . enoxaparin (LOVENOX) injection  30 mg Subcutaneous Q24H  . hydrocortisone  10 mg Oral BID  . senna  1 tablet Oral BID  . sertraline  50 mg Oral Daily    Continuous Infusions:   Kendell Bane RD, LDN, CNSC [phone] Pager [phone] After Hours Pager

## 2012-11-16 NOTE — Progress Notes (Signed)
Subjective: No complaints today. No bleeding from surgical site. No Chest pain or SOB. Family present. Patient needs to be transfused.  Family ready to have patient moved to SNF.  Objective: Vital signs in last 24 hours: Filed Vitals:   11/15/12 2205 11/16/12 0546 11/16/12 0800 11/16/12 1200  BP: 86/45 128/61    Pulse: 75 69    Temp: 99 F (37.2 C) 98.4 F (36.9 C)    TempSrc:      Resp: 14 16 16 16   Height:      Weight:      SpO2: 97% 95%     Weight change:   Intake/Output Summary (Last 24 hours) at 11/16/12 1326 Last data filed at 11/15/12 2200  Gross per 24 hour  Intake    240 ml  Output      0 ml  Net    240 ml   General appearance: alert, cooperative, appears stated age and no distress Head: Normocephalic, without obvious abnormality, atraumatic Lungs: clear to auscultation bilaterally Heart: regular rate and rhythm, S1, S2 normal, no murmur, click, rub or gallop Abdomen: soft, non-tender; bowel sounds normal; no masses,  no organomegaly Extremities: extremities normal, atraumatic, no cyanosis, mild pedal edema.  Lab Results: Basic Metabolic Panel:  Recent Labs Lab 11/14/12 0530 11/15/12 0530  NA 142 142  K 3.0* 3.8  CL 110 111  CO2 23 24  GLUCOSE 131* 125*  BUN 18 24*  CREATININE 1.16* 1.25*  CALCIUM 7.2* 7.2*   CBC:  Recent Labs Lab 11/12/12 2148  11/15/12 0530 11/16/12 0635  WBC 14.3*  < > 7.3 6.3  NEUTROABS 12.5*  --   --   --   HGB 11.3*  < > 7.4* 6.9*  HCT 34.4*  < > 22.9* 20.9*  MCV 87.8  < > 87.4 86.7  PLT 259  < > 142* 153  < > = values in this interval not displayed.  Coagulation:  Recent Labs Lab 11/13/12 0600  LABPROT 14.0  INR 1.10   Urinalysis:  Recent Labs Lab 11/13/12 0031  COLORURINE YELLOW  LABSPEC 1.010  PHURINE 5.0  GLUCOSEU NEGATIVE  HGBUR NEGATIVE  BILIRUBINUR NEGATIVE  KETONESUR NEGATIVE  PROTEINUR NEGATIVE  UROBILINOGEN 0.2  NITRITE NEGATIVE  LEUKOCYTESUR NEGATIVE    Micro Results: Recent  Results (from the past 240 hour(s))  SURGICAL PCR SCREEN     Status: None   Collection Time    11/13/12 11:41 AM      Result Value Range Status   MRSA, PCR NEGATIVE  NEGATIVE Final   Staphylococcus aureus NEGATIVE  NEGATIVE Final   Comment:            The Xpert SA Assay (FDA     approved for NASAL specimens     in patients over 41 years of age),     is one component of     a comprehensive surveillance     program.  Test performance has     been validated by [employer] for patients greater     than or equal to 71 year old.     It is not intended     to diagnose infection nor to     guide or monitor treatment.   Medications: I have reviewed the patient's current medications. Scheduled Meds: . docusate sodium  100 mg Oral BID  . enoxaparin (LOVENOX) injection  30 mg Subcutaneous Q24H  . hydrocortisone  10 mg Oral BID  .  senna  1 tablet Oral BID  . sertraline  50 mg Oral Daily   Continuous Infusions:  PRN Meds:.acetaminophen, acetaminophen, alum & mag hydroxide-simeth, bisacodyl, HYDROcodone-acetaminophen, menthol-cetylpyridinium, metoCLOPramide (REGLAN) injection, metoCLOPramide, ondansetron (ZOFRAN) IV, ondansetron, phenol, polyethylene glycol, promethazine Assessment/Plan: Principal Problem:   Fracture of femoral neck, left Active Problems:   MONOCLONAL GAMMOPATHY   DEPRESSION   GERD   Adult failure to thrive   CKD (chronic kidney disease) stage 2, GFR 60-89 ml/min   Adrenal insufficiency   Gluten intolerance   Severe malnutrition   Protein-calorie malnutrition, severe   Subcapital fracture of left hip   Postoperative anemia due to acute blood loss   # Fracture of the Lt femoral Neck- Sub-capital. - Patient had Lt Bipolar hip arthroplasty- 3days- 11/13/2012. [facility] Kitchen Procedure went well and was well tolerated. No bleeding from op site. - Ortho seen- for possible discharge tomorrow pending SNF placement.  # Post- op  Anaemia due to acute blood loss- HCT today- 6.9, pre-  op- 9.9 baseline - 10. Will be transfused with PRBCs today - 2 units. # Adrenal insufficiency: Pt was previously on hydrocortisone 10mg  in AM and 5mg  PM at home.  -To D/c stress dose of hydrocort and continue home regimen.  # Failure to thrive: Pt has poor po intake and gluten intolerance with multiple comorbidities. Albumin levels has been chronically low, last 1.9- 10/18/2012.  -Recommendations from Nutritionist appreciated.  # CKD stage 2: Cr on admission- 1.23. Stable. No interventions required at this time.  # Depression- Was on sertraline prior to admission, will resume meds after surg.  # DVT ppx- lovenox- 30mg  [place] Q24H.  - Talked to ortho about recommendations for DVT ppx on discharge to SNF- recommended- Lovenox 40 mg daily for 3-4 weeks.    Dispo: Disposition is deferred at this time, awaiting improvement of current medical problems.  Anticipated discharge in approximately 1 day(s).   The patient does have a current PCP Genelle Gather, MD) and does need an [facility] hospital follow-up appointment after discharge.  The patient does not have transportation limitations that hinder transportation to clinic appointments.  .Services Needed at time of discharge: Y = Yes, Blank = No PT:   OT:   RN:   Equipment:   Other:     LOS: 4 days   Kennis Carina, MD 11/16/2012, 1:26 PM

## 2012-11-17 ENCOUNTER — Encounter (HOSPITAL_COMMUNITY): Payer: Self-pay | Admitting: Orthopedic Surgery

## 2012-11-17 ENCOUNTER — Telehealth: Payer: Self-pay | Admitting: Geriatric Medicine

## 2012-11-17 DIAGNOSIS — E41 Nutritional marasmus: Secondary | ICD-10-CM

## 2012-11-17 DIAGNOSIS — S72009S Fracture of unspecified part of neck of unspecified femur, sequela: Secondary | ICD-10-CM

## 2012-11-17 LAB — TYPE AND SCREEN
ABO/RH(D): B POS
Antibody Screen: NEGATIVE
Unit division: 0

## 2012-11-17 LAB — CBC
Hemoglobin: 9.6 g/dL — ABNORMAL LOW (ref 12.0–15.0)
MCH: 28.6 pg (ref 26.0–34.0)
MCHC: 32.7 g/dL (ref 30.0–36.0)

## 2012-11-17 MED ORDER — HYDROCODONE-ACETAMINOPHEN 5-325 MG PO TABS: 1.0000 | ORAL_TABLET | Freq: Four times a day (QID) | ORAL | Status: DC | PRN

## 2012-11-17 MED ORDER — POLYETHYLENE GLYCOL 3350 17 G PO PACK: 17.0000 g | PACK | Freq: Every day | ORAL | Status: DC | PRN

## 2012-11-17 NOTE — Progress Notes (Signed)
Patient ID: Rachel Bridges, female   DOB: April 01, 1925, 79 y.o.   MRN: [id number]     Subjective:  Patient reports pain as mild.  Patient resting well but woke up and was alert and followed commands.  Denies any CP or SOB  Objective:   VITALS:   Filed Vitals:   11/16/12 2202 11/16/12 2302 11/16/12 2325 11/17/12 0612  BP: 109/62 118/63 121/67 113/81  Pulse: 74 74 69 56  Temp: 99.1 F (37.3 C) 98.4 F (36.9 C) 98.6 F (37 C) 99 F (37.2 C)  TempSrc: Oral Oral Oral   Resp: 16 16 18 16   Height:      Weight:      SpO2:    96%    ABD soft Sensation intact distally Dorsiflexion/Plantar flexion intact Incision: dressing C/D/I and no drainage   Lab Results  Component Value Date   WBC 5.8 11/17/2012   HGB 9.6* 11/17/2012   HCT 29.4* 11/17/2012   MCV 87.5 11/17/2012   PLT 165 11/17/2012     Assessment/Plan: 4 Days Post-Op   Principal Problem:   Fracture of femoral neck, left Active Problems:   MONOCLONAL GAMMOPATHY   DEPRESSION   GERD   Adult failure to thrive   CKD (chronic kidney disease) stage 2, GFR 60-89 ml/min   Adrenal insufficiency   Gluten intolerance   Severe malnutrition   Protein-calorie malnutrition, severe   Subcapital fracture of left hip   Postoperative anemia due to acute blood loss   Advance diet Up with therapy WBAT Dry dressing PRN Continue plan per medicine. Okay for SNF when cleared by medicine ABLA improved.  lovenox x 3 weeks, hydrocodone, WBAT, all in orders section.   Cletus Paris P 11/17/2012, 9:59 AM   Teryl Lucy, MD Cell [phone]

## 2012-11-17 NOTE — Discharge Summary (Signed)
  Date: 11/17/2012  Patient name: Rachel Bridges  Medical record number: [id number]  Date of birth: May 26, 1924   This patient has been seen and the plan of care was discussed with the house staff. Please see their note for complete details. I concur with their findings and plan. Lovenox for 3 weeks (40 mg [place] daily) for DVT prophylaxis s/p L hip surgery.  Jonah Blue, DO 11/17/2012, 3:15 PM

## 2012-11-17 NOTE — Progress Notes (Signed)
Subjective: No complaints today. Ready to go to SNF pending placement.  Objective: Vital signs in last 24 hours: Filed Vitals:   11/16/12 2202 11/16/12 2302 11/16/12 2325 11/17/12 0612  BP: 109/62 118/63 121/67 113/81  Pulse: 74 74 69 56  Temp: 99.1 F (37.3 C) 98.4 F (36.9 C) 98.6 F (37 C) 99 F (37.2 C)  TempSrc: Oral Oral Oral   Resp: 16 16 18 16   Height:      Weight:      SpO2:    96%   Weight change:   Intake/Output Summary (Last 24 hours) at 11/17/12 1353 Last data filed at 11/17/12 0700  Gross per 24 hour  Intake   1030 ml  Output      0 ml  Net   1030 ml   General appearance: alert, cooperative, appears stated age and no distress Lungs: clear to auscultation bilaterally Heart: regular rate and rhythm, S1, S2 normal, no murmur, click, rub or gallop Abdomen: soft, non-tender; bowel sounds normal; no masses,  no organomegaly Extremities: mild pedal edema.  Lab Results: Basic Metabolic Panel:  Recent Labs Lab 11/14/12 0530 11/15/12 0530  NA 142 142  K 3.0* 3.8  CL 110 111  CO2 23 24  GLUCOSE 131* 125*  BUN 18 24*  CREATININE 1.16* 1.25*  CALCIUM 7.2* 7.2*   CBC:  Recent Labs Lab 11/12/12 2148  11/16/12 0635 11/17/12 0500  WBC 14.3*  < > 6.3 5.8  NEUTROABS 12.5*  --   --   --   HGB 11.3*  < > 6.9* 9.6*  HCT 34.4*  < > 20.9* 29.4*  MCV 87.8  < > 86.7 87.5  PLT 259  < > 153 165  < > = values in this interval not displayed.  Coagulation:  Recent Labs Lab 11/13/12 0600  LABPROT 14.0  INR 1.10   Urinalysis:  Recent Labs Lab 11/13/12 0031  COLORURINE YELLOW  LABSPEC 1.010  PHURINE 5.0  GLUCOSEU NEGATIVE  HGBUR NEGATIVE  BILIRUBINUR NEGATIVE  KETONESUR NEGATIVE  PROTEINUR NEGATIVE  UROBILINOGEN 0.2  NITRITE NEGATIVE  LEUKOCYTESUR NEGATIVE   Micro Results: Recent Results (from the past 240 hour(s))  SURGICAL PCR SCREEN     Status: None   Collection Time    11/13/12 11:41 AM      Result Value Range Status   MRSA, PCR  NEGATIVE  NEGATIVE Final   Staphylococcus aureus NEGATIVE  NEGATIVE Final   Comment:            The Xpert SA Assay (FDA     approved for NASAL specimens     in patients over 53 years of age),     is one component of     a comprehensive surveillance     program.  Test performance has     been validated by [employer] for patients greater     than or equal to 66 year old.     It is not intended     to diagnose infection nor to     guide or monitor treatment.   Studies/Results: No results found. Medications: I have reviewed the patient's current medications. Scheduled Meds: . docusate sodium  100 mg Oral BID  . enoxaparin (LOVENOX) injection  30 mg Subcutaneous Q24H  . feeding supplement  237 mL Oral BID BM  . hydrocortisone  10 mg Oral BID  . senna  1 tablet Oral BID  . sertraline  50 mg Oral  Daily   Continuous Infusions:  PRN Meds:.acetaminophen, acetaminophen, alum & mag hydroxide-simeth, bisacodyl, HYDROcodone-acetaminophen, menthol-cetylpyridinium, metoCLOPramide (REGLAN) injection, metoCLOPramide, ondansetron (ZOFRAN) IV, ondansetron, phenol, polyethylene glycol, promethazine Assessment/Plan:  # Fracture of the Lt femoral Neck- Sub-capital. - Patient had Lt Bipolar hip arthroplasty- 3days- 11/13/2012.  Procedure went well and was well tolerated. No bleeding from op site. Minimal pain. - Ortho seen- for possible discharge today pending SNF placement.  -Currently on Hydrocodone-acetaminophen 5-325mg  2 tabs PO Q6H, PRN.  -Dry dressing of op-site. -Discharge to SNF for rehab, options for placement discussed with family. They want a facility with diet tailored to patients with gluten sensitivity.  # Post- op Anaemia due to acute blood loss- Patient was transfused with 2 units of blood- pre-transfusion- 6.9, post transfusion- 9.6.  # Adrenal insufficiency: Pt was previously on hydrocortisone 10mg  in AM and 5mg  PM at home.  -To D/c stress dose of hydrocort and continue home  regimen and continue on discharge. # Failure to thrive: Pt has poor po intake and gluten intolerance with multiple comorbidities. Albumin levels has been chronically low, last 1.9- 10/18/2012.  # CKD stage 2: Cr on admission- 1.23. Stable. No interventions required during hospitalizations. # Depression- Was on sertraline prior to admission, will resume meds on discharge.  # DVT ppx- lovenox- 30mg  [place] Q24H.  - Talked to ortho about recommendations for DVT ppx on discharge to SNF- recommended- Lovenox 40 mg daily for 3-4 weeks.     Dispo:  Anticipated discharge today.   The patient does have a current PCP Genelle Gather, MD) and does need an [facility] hospital follow-up appointment after discharge.  The patient does not have transportation limitations that hinder transportation to clinic appointments.  .Services Needed at time of discharge: Y = Yes, Blank = No PT:   OT:   RN:   Equipment:   Other:     LOS: 5 days   Kennis Carina, MD 11/17/2012, 1:53 PM

## 2012-11-17 NOTE — Progress Notes (Signed)
Clinical social worker assisted with patient discharge to skilled nursing facility, [facility] and [place].  CSW addressed all family questions and concerns. CSW copied chart and added all important documents. CSW also set up patient transportation with [employer]. Clinical Social Worker will sign off for now as social work intervention is no longer needed.    Sabino Niemann, MSW, [phone]

## 2012-11-17 NOTE — Discharge Summary (Signed)
Name: Rachel Bridges MRN: [id number] DOB: Feb 27, 1925 79 y.o. PCP: Genelle Gather, MD  Date of Admission: 11/12/2012  7:07 PM Date of Discharge: 11/17/2012 Attending Physician: Jonah Blue, DO  Discharge Diagnosis: Principal Problem:   Fracture of femoral neck, left Active Problems:   MONOCLONAL GAMMOPATHY   DEPRESSION   GERD   Adult failure to thrive   CKD (chronic kidney disease) stage 3, GFR 30-59 ml/min   Adrenal insufficiency   Gluten intolerance   Severe malnutrition   Protein-calorie malnutrition, severe   Subcapital fracture of left hip   Postoperative anemia due to acute blood loss  Discharge Medications:   Medication List         acetaminophen 325 MG tablet  Commonly known as:  TYLENOL  Take 325 mg by mouth daily as needed. For pain     aspirin 81 MG tablet  Take 81 mg by mouth daily.     calcium citrate-vitamin D 315-200 MG-UNIT per tablet  Commonly known as:  CITRACAL+D  Take 1 tablet by mouth 2 (two) times daily.     cyanocobalamin 1000 MCG/ML injection  Commonly known as:  (VITAMIN B-12)  Inject 1 mL (1,000 mcg total) into the muscle every 30 (thirty) days.     enoxaparin 40 MG/0.4ML injection  Commonly known as:  LOVENOX  Inject 0.4 mLs (40 mg total) into the skin daily.     feeding supplement Liqd  Take 237 mLs by mouth daily. Vanilla.     furosemide 20 MG tablet  Commonly known as:  LASIX  Take 1 tablet (20 mg total) by mouth daily.     guaifenesin 100 MG/5ML syrup  Commonly known as:  ROBITUSSIN  Take 5 mLs (100 mg total) by mouth every 4 (four) hours as needed for congestion.     HYDROcodone-acetaminophen 5-325 MG per tablet  Commonly known as:  NORCO  Take 1-2 tablets by mouth every 6 (six) hours as needed for pain. MAXIMUM TOTAL ACETAMINOPHEN DOSE IS 4000 MG PER DAY     hydrocortisone 10 MG tablet  Commonly known as:  CORTEF  Take 5-10 mg by mouth 2 (two) times daily. 10 mg in the a.m. And 5 mg in the afternoon.     nystatin 100000 UNIT/GM Powd  Apply to affected skin as needed for yeast infection in skin folds.     ondansetron 8 MG disintegrating tablet  Commonly known as:  ZOFRAN ODT  Take 1 tablet (8 mg total) by mouth every 8 (eight) hours as needed for nausea.     polyethylene glycol packet  Commonly known as:  MIRALAX / GLYCOLAX  Take 17 g by mouth daily as needed.     potassium chloride 10 MEQ tablet  Commonly known as:  K-DUR  Take 20 mEq by mouth 2 (two) times daily.     sennosides-docusate sodium 8.6-50 MG tablet  Commonly known as:  SENOKOT-S  Take 2 tablets by mouth daily.     sertraline 50 MG tablet  Commonly known as:  ZOLOFT  Take 1 tablet (50 mg total) by mouth daily.     simethicone 80 MG chewable tablet  Commonly known as:  MYLICON  Chew 160 mg by mouth daily as needed. For flatulence     ZEMPLAR 1 MCG capsule  Generic drug:  paricalcitol  Take 1 mcg by mouth every Monday, Wednesday, and Friday.     zolpidem 5 MG tablet  Commonly known as:  AMBIEN  Take 0.5 tablets (2.5  mg total) by mouth at bedtime as needed for sleep.        Disposition and follow-up:   Rachel Bridges was discharged from [facility] in Stable condition.  At the hospital follow up visit please address:  1.  Recovery from hip fracture 2.  CBC to monitor Hb  Follow-up Appointments: Follow-up Information   Follow up with Eulas Post, MD. Schedule an appointment as soon as possible for a visit in 2 weeks.   Contact information:   [address] [place] [address] [phone]       Schedule an appointment as soon as possible for a visit with Genelle Gather, MD. (As needed)    Contact information:   [address] [place] [address] [phone]       Discharge Instructions: Discharge Orders   Future Appointments Provider Department Dept Phone   02/17/2013 1:00 PM [facility] [phone]   02/17/2013 2:00 PM Mauri [facility] MEDICAL ONCOLOGY [phone]   02/24/2013 9:00 AM Si Gaul, MD [facility] MEDICAL ONCOLOGY [phone]   03/04/2013 1:15 PM Genelle Gather, MD [facility] [phone]   Future Orders Complete By Expires     Call MD for:  persistant nausea and vomiting  As directed     Call MD for:  redness, tenderness, or signs of infection (pain, swelling, redness, odor or green/yellow discharge around incision site)  As directed     Call MD for:  severe uncontrolled pain  As directed     Call MD for:  temperature >100.4  As directed     Diet - low sodium heart healthy  As directed     Discharge instructions  As directed     Comments:      You will need to undergo physical therapy at least 3-5 times per week so that we may ensure appropriate recovery after your hip fracture.  Continue taking calcium and Vitamin D as well.  You will also be taking an injection for 3 weeks per your orthopedist physician to prevent formation of blood clots in your lower extremities.    Increase activity slowly  As directed     Posterior total hip precautions  As directed     Weight bearing as tolerated  As directed        Consultations: Treatment Team:  Eulas Post, MD  Procedures Performed:  Dg Hip Complete Left 11/12/2012   *RADIOLOGY REPORT*  Clinical Data: Left groin pain.  LEFT HIP - COMPLETE 2+ VIEW  Comparison: CT 10/14/2012  Findings: Again seen is marked gaseous distention of bowel loops throughout the abdomen and pelvis.  Pedicle screw and rod fixation in the lumbar spine.  There is a displaced fracture of the proximal left femur.  This is a subcapital fracture with superior displacement of the distal fragment.  Left femoral head is located.  IMPRESSION: Displaced left subcapital hip fracture.   Original Report Authenticated By: Richarda Overlie, M.D.   Dg Pelvis  Portable 11/13/2012   *RADIOLOGY REPORT*  Clinical Data: Left hip replacement.  PORTABLE PELVIS  Comparison: Plain films left hip 11/12/2012.  Findings: The patient has a new bipolar left hip hemiarthroplasty. The device is located.  No fracture is identified.  IMPRESSION: Left hip replacement without complication.   Original Report Authenticated By: Holley Dexter, M.D.   Dg Chest Portable 1 View 11/13/2012   *  RADIOLOGY REPORT*  Clinical Data: Left femoral neck fracture, preoperative evaluation, history smoking, carcinoma of the colon  PORTABLE CHEST - 1 VIEW  Comparison: Portable exam 1106 hours compared to 09/23/2012  Findings: Upper normal heart size. Mediastinal contours and pulmonary vascularity normal. Slight rotation to the left. Emphysematous changes consistent with COPD. No acute infiltrate, pleural effusion or pneumothorax. Colon interposition between liver and diaphragm. Diffuse osseous demineralization.  IMPRESSION: Emphysematous changes consistent with COPD. No acute abnormalities.   Original Report Authenticated By: Ulyses Southward, M.D.   Dg Hip Portable 1 View Left 11/13/2012   *RADIOLOGY REPORT*  Clinical Data: Status post hip replacement.  PORTABLE LEFT HIP - 1 VIEW  Comparison: Plain film of the left hip 11/12/2012.  Findings: Bipolar left hip hemiarthroplasty is in place.  The device appears located.  No fracture.  IMPRESSION: Left hip replacement without evidence of complication.   Original Report Authenticated By: Holley Dexter, M.D.   Dg Knee Complete 4 Views Left 11/12/2012   *RADIOLOGY REPORT*  Clinical Data: Unable to straighten the left knee.  LEFT KNEE - COMPLETE 4+ VIEW  Comparison: None.  Findings: Status post left total knee arthroplasty.  The femoral and tibial components appear to be well situated.  No acute fracture or dislocation is noted.  The knee is in flexion.  IMPRESSION: Status post left total knee arthroplasty.  No acute fracture or dislocation is noted.   Original  Report Authenticated By: Lupita Raider.,  M.D.    Admission HPI:  Rachel Bridges is an 79 yo wf with PMH of adrenal insufficiency, colon cancer s/p partial colectomy, CKD stage II, osteopenia, failure to thrive, and other comorbidities. According to her daughters, the patient was walking to the kitchen this afternoon and slipped on the hardwood floors. She typically ambulates with a walker but failed to use her walker when she got up. Family members were able to help her up and on the commode. The patient immediately c/o left hip pain and was unable to bear weight on it. The family denies any LOC or other injuries. There was no chest pain, SOB, or palpitations. The patient is DNR/DNI.   Admission Physical Exam:  Blood pressure 112/66, pulse 73, temperature 98.4 F (36.9 C), temperature source Oral, resp. rate 17, height 5' 7.25" (1.708 m), weight 51.256 kg (113 lb), SpO2 95.00%.  General: resting in bed in NAD;  HEENT: PERRL, EOMI  Cardiac: RRR, no rubs, murmurs or gallops  Pulm: clear to auscultation bilaterally,  Abd: soft, nontender, nondistended, BS present  Ext: warm and well perfused, no pedal edema; pt. lying on left side because more comfortable  Neuro: alert and oriented X3, cranial nerves II-XII grossly intact   Admission Lab results:  Basic Metabolic Panel:   11/12/12 2148   NA  145   K  3.8   CL  109   CO2  26   GLUCOSE  93   BUN  19   CREATININE  1.23*   CALCIUM  8.0*    CBC:   11/12/12 2148   WBC  14.3*   NEUTROABS  12.5*   HGB  11.3*   HCT  34.4*   MCV  87.8   PLT  259    Urinalysis:    11/13/12 0031   COLORURINE  YELLOW   LABSPEC  1.010   PHURINE  5.0   GLUCOSEU  NEGATIVE   HGBUR  NEGATIVE   BILIRUBINUR  NEGATIVE   KETONESUR  NEGATIVE   PROTEINUR  NEGATIVE   UROBILINOGEN  0.2   NITRITE  NEGATIVE   LEUKOCYTESUR  NEGATIVE      Hospital Course by problem list:  #Left Femoral Neck Fracture: s/p mechanical fall without syncope.  Dr. Dion Saucier performed  arthroplasty with Depuy [place] Basic Femoral Fracture stem size 6, with a stem centralizer, a cement restrictor, with a -3 spacer and a size 46 fracture head unipolar hip ball, and a total of 2 bags of Depuy CMW 1 Bone Cement on 11/13/12.  Patient is recovering well and worked well with PT, who recommended min 3x/week physical therapy, and she is weight bearing as tolerated per ortho.  She is being discharged to SNF for further rehabilitation.  Dr. Dion Saucier provided scripts for 3 weeks of lovenox injections for VTE ppx and Vicodin 5-325, 1-2 tabs q6h prn for pain management.  #Postoperative anemia due to acute blood loss: At admission, patient's Hb was 11.3 (at baseline), and she developed postoperative anemia, down to Hb 6.9 on POD#3, after which she was subsequently transfused 2u PRBCs.  Her Hb more than appropriately responded to 9.6.  She will need CBC at [facility] and further work up if needed.   #Adrenal Insufficiency: Patient has history of adrenal insufficiency, for which she takes hydrocortisone, 10mg  qAM and 5mg  qAfternoon. Prior to arthroplasty (moderate surgical stress), she was treated with hydrocortisone 50mg  IV on day of surgery and POD#1, and then transitioned to hydrocortisone 10mg  BID.  She was discharged on her home regimen.   #Failure to thrive: Patient has poor PO intake and requires a gluten intolerance diet.  Her albumin levels are chronically low (as low as 1.9, 10/18/12), which may affect her recovery.    #CKD Stage 3: Renal function was stable throughout hospitalization.   Principal Problem:   Fracture of femoral neck, left Active Problems:   MONOCLONAL GAMMOPATHY   DEPRESSION   GERD   Adult failure to thrive   CKD (chronic kidney disease) stage 3, GFR 30-59 ml/min   Adrenal insufficiency   Gluten intolerance   Severe malnutrition   Protein-calorie malnutrition, severe   Subcapital fracture of left hip   Postoperative anemia due to acute blood loss   Discharge Vitals:   BP  113/81  Pulse 56  Temp(Src) 99 F (37.2 C) (Oral)  Resp 16  Ht 5' 7.25" (1.708 m)  Wt 113 lb (51.256 kg)  BMI 17.57 kg/m2  SpO2 96%  Discharge Labs:  Results for orders placed during the hospital encounter of 11/12/12 (from the past 24 hour(s))  CBC     Status: Abnormal   Collection Time    11/17/12  5:00 AM      Result Value Range   WBC 5.8  4.0 - 10.5 K/uL   RBC 3.36 (*) 3.87 - 5.11 MIL/uL   Hemoglobin 9.6 (*) 12.0 - 15.0 g/dL   HCT 45.4 (*) 09.8 - 11.9 %   MCV 87.5  78.0 - 100.0 fL   MCH 28.6  26.0 - 34.0 pg   MCHC 32.7  30.0 - 36.0 g/dL   RDW 14.7  82.9 - 56.2 %   Platelets 165  150 - 400 K/uL    Signed: Belia Heman, MD 11/17/2012, 1:38 PM   Time Spent on Discharge: 35 minutes

## 2012-11-17 NOTE — Progress Notes (Signed)
Report called to Charlene at [facility]. All questions answered. 

## 2012-11-18 ENCOUNTER — Other Ambulatory Visit: Payer: Self-pay | Admitting: Geriatric Medicine

## 2012-11-18 DIAGNOSIS — G4700 Insomnia, unspecified: Secondary | ICD-10-CM

## 2012-11-18 MED ORDER — ZOLPIDEM TARTRATE 5 MG PO TABS: 2.5000 mg | ORAL_TABLET | Freq: Every evening | ORAL | Status: DC | PRN

## 2012-11-20 ENCOUNTER — Non-Acute Institutional Stay (SKILLED_NURSING_FACILITY): Payer: Medicare Other | Admitting: Internal Medicine

## 2012-11-20 DIAGNOSIS — S72142D Displaced intertrochanteric fracture of left femur, subsequent encounter for closed fracture with routine healing: Secondary | ICD-10-CM

## 2012-11-20 DIAGNOSIS — E2749 Other adrenocortical insufficiency: Secondary | ICD-10-CM

## 2012-11-20 DIAGNOSIS — S72009D Fracture of unspecified part of neck of unspecified femur, subsequent encounter for closed fracture with routine healing: Secondary | ICD-10-CM

## 2012-11-20 DIAGNOSIS — E2740 Unspecified adrenocortical insufficiency: Secondary | ICD-10-CM

## 2012-11-20 DIAGNOSIS — N189 Chronic kidney disease, unspecified: Secondary | ICD-10-CM

## 2012-11-20 NOTE — Progress Notes (Signed)
Patient ID: Rachel Bridges, female   DOB: [birth year], 79 y.o.   MRN: [id number] [facility] Chief complaint; admission to SNF post admit at [facility] July 17 through 11/17/2012 History; the patient, who lives at home with her daughter and son-in-law. She normally uses a cane to ambulate. She tells me she has had 2-3 falls in the last 3 months, which are usually stumbles. She can usually get herself back up. On this occasion, she slipped on the hardwood floors. She complained of left hip pain. She underwent a left hip arthroplasty. Postoperative course seems relatively benign. The she had a drop in hemoglobin from 11.3. At baseline to 6.9. On postoperative day #3. She was transfused 2 units and her discharge hemoglobin was 9.6. She has a history of adrenal insufficiency and appropriate perioperative intravenous steroids. She is on hydrocortisone 10 twice a day  Past Medical History  Diagnosis Date  . MGUS (monoclonal gammopathy of unknown significance)      [facility]  . Anxiety   . Depression   . Diverticulosis of colon   . Osteopenia   . Lumbar spinal stenosis   . Atrophic vaginitis   . History of tobacco abuse     at least 64 pack year. Started about age 85 and continued until about age 80.  Marland Kitchen Dermatitis   . Hip pain      History  . Actinic keratosis   . Dementia   . Adrenal suppression     central, on chronic steroids, followed by Rachel Bridges of endocrinology  . Gluten intolerance     August 2011 endoscopy with biopsy negative for celiac disease. Prior to this patient had elevated TTG IgG of 93 (normal <20). Rachel Loreta Bridges made clinical diagnosis of gluten intolerance based on diarrhea/symptoms from eating gluten. Needs GF diet.   Daughter tries to control diet.  . Colon cancer     History of bowel  obstruction. on treatment with Colace and MiraLax.  followed by Rachel Bridges and Rachel Bridges  . Shortness of breath     "sometimes; can happen at  any time" (10/14/2012)  . Arthritis     "probably" (10/14/2012)  . Renal cyst   . CKD (chronic kidney disease), stage II     Rachel Bridges 10/14/2012  . Stress incontinence in female   . Subcapital fracture of left hip 11/13/2012    Rachel Bridges   Medications; ASA, 81 daily, Os-Cal plus D3 15/200, one tablet twice a day, vitamin B12 thousand micrograms IM monthly, Lovenox 40 mg daily, nutritional supplement daily, Lasix, 20 mg daily, Robitussin 5 mL to 4 when necessary, Norco one to 2 tablets every 6 hours as needed, hydrocortisone 10 mg in the a.m. and 5 mg in the p.m., Zofran 8 mg when necessary, MiraLax 17 g daily when necessary, K-Dur 20 milliequivalents twice a day, Senokot-S 2 tablets by mouth daily, Zoloft, 50 mg daily, Zemplar 1 mg capsules Monday, Wednesday, Friday, Ambien 2.5 mg each bedtime when necessary  Social; as mentioned above. Patient lives with her daughter and son-in-law. Uses a cane. Nonsmoker. No significant alcohol history. She has a history of falls   Review of systems Respiratory; no complaints of cough or shortness of breath. Cardiac no exertional chest pain. GI; no abdominal pain. States she has a history of constipation. GU possible urinary frequency. Musculoskeletal denies back pain or long bone pain.  Physical examination pulse rate 92, respirations 18. Gen. she is not  in any distress. HEENT; small area on her left anterior, which appears as a venous lake, patient states that this has been there for a long time and is unchanged. Respiratory; clear entry bilaterally. Cardiac heart sounds are normal. No murmurs. No bruits. Abdomen; somewhat distended. No liver no spleen. Bowel sounds are positive. Musculoskeletal; I was not able to see her decision today. However, she has no lower extremity edema. Neurologic; mild left pronator drift. There is significant proximal lower extremity weakness even on the nonoperative leg at 4/5 hip flexor and abduction.  Impression/plan #1  status post left hip replacement. This is an patient who has gait instability. There may be some orthostatic intolerance, which probably should be checked. I suspect she has underlying osteoporosis. #2 on a Zemplar presumably for secondary hyperparathyroidism question renal insufficiency. #3 postoperative, anemia. We'll check this. #4 on a gluten-free diet for a clinical diagnosis of celiac disease. #5 centrally mediated adrenal insufficiency on replacement. This will need to be noted in the plan of care. Should for any reason. She become n.p.o.

## 2012-11-26 ENCOUNTER — Non-Acute Institutional Stay (SKILLED_NURSING_FACILITY): Payer: Medicare Other | Admitting: Internal Medicine

## 2012-11-26 DIAGNOSIS — R609 Edema, unspecified: Secondary | ICD-10-CM

## 2012-12-01 ENCOUNTER — Non-Acute Institutional Stay (SKILLED_NURSING_FACILITY): Payer: Medicare Other | Admitting: Internal Medicine

## 2012-12-01 DIAGNOSIS — D62 Acute posthemorrhagic anemia: Secondary | ICD-10-CM

## 2012-12-01 DIAGNOSIS — R634 Abnormal weight loss: Secondary | ICD-10-CM

## 2012-12-02 NOTE — Addendum Note (Signed)
Addended by: Neomia Dear on: 12/02/2012 06:58 PM   Modules accepted: Orders

## 2012-12-07 ENCOUNTER — Other Ambulatory Visit: Payer: Self-pay | Admitting: Internal Medicine

## 2012-12-07 NOTE — Telephone Encounter (Signed)
Medication refill- zoloft

## 2012-12-07 NOTE — Telephone Encounter (Signed)
Medication refill- zoloft 

## 2012-12-10 ENCOUNTER — Telehealth: Payer: Self-pay | Admitting: *Deleted

## 2012-12-10 NOTE — Telephone Encounter (Signed)
Yes, thanks

## 2012-12-10 NOTE — Telephone Encounter (Signed)
Dianne, HHN calls for approval of pt, ot and hhn visits, verbal given, do you agree?

## 2012-12-13 NOTE — Progress Notes (Signed)
Patient ID: Rachel Bridges, female   DOB: 29-May-1924, 79 y.o.   MRN: [id number]           PROGRESS NOTE  DATE:  11/25/2012  FACILITY: [name]   LEVEL OF CARE:   SNF   Acute Visit   CHIEF COMPLAINT:  Lower extremity edema.    HISTORY OF PRESENT ILLNESS:  This is a lady who came to [place] after fracturing her left hip.  She is on Lovenox 40 mg daily for DVT prophylaxis.  My initial H&P from 11/20/2012 lists no edema.  She is on Lasix 20 mg daily.    Staff have noted bilateral lower extremity edema with some complaints of discomfort in the left leg.  I was asked to see her because of this.    REVIEW OF SYSTEMS:   CHEST/RESPIRATORY:  She is not complaining of shortness of breath or cough.   CARDIAC:   No exertional chest pain.   CIRCULATION:  Left leg edema greater than right and some discomfort in her calf.    PHYSICAL EXAMINATION:   VITAL SIGNS:   PULSE:  88.  RESPIRATIONS:  20.   CHEST/RESPIRATORY:  Clear air entry bilaterally without crackles or wheezes.    CARDIOVASCULAR:  CARDIAC:   Heart sounds are normal.  There are no murmurs or gallops.  Her JVP is not elevated.   CIRCULATION:  EDEMA/VARICOSITIES:  Extremities:  She does indeed have fairly significant left leg swelling up to just below her knee.  However, there is no warmth and only minimal tenderness.  There is much lesser degree of change in the right leg, as well.    ASSESSMENT/PLAN:  Bilateral lower extremity edema in the setting of a post left hip replacement.   I will rule out DVT in the left leg, although my level of suspicion here is not all that high.  As mentioned, she is on Lovenox for DVT prophylaxis.    CPT CODE: 40981

## 2012-12-15 ENCOUNTER — Telehealth: Payer: Self-pay | Admitting: *Deleted

## 2012-12-15 NOTE — Telephone Encounter (Signed)
Received call from [facility] Physical Therapist with [name].# [phone] He is asking for Verbal OKay to start Home PT .  Pt just home from SNF.  Please advise

## 2012-12-16 NOTE — Telephone Encounter (Signed)
I thought this was already approved. If not, Home PT is fine.

## 2012-12-17 ENCOUNTER — Ambulatory Visit: Payer: Medicare Other | Admitting: Internal Medicine

## 2012-12-17 NOTE — Telephone Encounter (Signed)
Rachel Bridges, PT informed

## 2012-12-22 NOTE — Progress Notes (Signed)
Patient ID: Rachel Bridges, female   DOB: 06-Feb-1925, 79 y.o.   MRN: [id number]        PROGRESS NOTE  DATE: 12/01/2012  FACILITY:  [name]  LEVEL OF CARE: SNF (31)  Acute Visit  CHIEF COMPLAINT:  Manage acute blood loss anemia, chronic kidney disease stage III, and weight loss.    HISTORY OF PRESENT ILLNESS: I was requested by the staff to assess the patient regarding above problem(s):  ANEMIA: The anemia is unstable. The patient denies fatigue, melena or hematochezia. The patient is currently not on iron.   On 11/26/2012:  Hemoglobin 9.2, MCV 86.5.  On 11/12/2012:  Hemoglobin 12.5.  She is status post transfusions of 2 U of packed red blood cells.    CHRONIC KIDNEY DISEASE: The patient's chronic kidney disease remains stable.  Renal functions improved.  Patient denies increasing lower extremity swelling or confusion. Last BUN and creatinine are:  On 11/26/2012:  BUN 28, creatinine 1.2.  On 11/12/2012:  BUN 19, creatinine 1.23.  The patient has  chronic lower extremity swelling.    WEIGHT LOSS:  Dietitian reports that patient has had a significant weight loss in seven days, which is 5.4 lbs.    Weight loss is thought to be secondary to fluid loss from Lasix.    PAST MEDICAL HISTORY : Reviewed.  No changes.  CURRENT MEDICATIONS: Reviewed per [facility]  REVIEW OF SYSTEMS:  GENERAL: no change in appetite, no fatigue, no weight changes, no fever, chills or weakness RESPIRATORY: no cough, SOB, DOE,, wheezing, hemoptysis CARDIAC: no chest pain or palpitations;  chronic lower extremity swelling   GI: no abdominal pain, diarrhea, constipation, heart burn, nausea or vomiting  PHYSICAL EXAMINATION  GENERAL: no acute distress, normal body habitus EYES: conjunctivae normal, sclerae normal, normal eye lids NECK: supple, trachea midline, no neck masses, no thyroid tenderness, no thyromegaly LYMPHATICS: no LAN in the neck, no supraclavicular LAN RESPIRATORY: breathing  is even & unlabored, BS CTAB CARDIAC: RRR, no murmur,no extra heart sounds EDEMA/VARICOSITIES: left lower extremity has +2 edema, right lower extremity has +1 edema ARTERIAL: pedal pulses nonpalpable  GI: abdomen soft, normal BS, no masses, no tenderness, no hepatomegaly, no splenomegaly PSYCHIATRIC: the patient is alert & oriented to person, affect & behavior appropriate  ASSESSMENT/PLAN:  Acute blood loss anemia.  Unstable problem.  Hemoglobin declined.  Reassess.    Chronic kidney disease stage III.  Renal functions improved.   Weight loss.  Thought to be secondary to diuresis.  Check liver profile and TSH.    CPT CODE: 65784

## 2012-12-23 DIAGNOSIS — R634 Abnormal weight loss: Secondary | ICD-10-CM | POA: Insufficient documentation

## 2012-12-25 ENCOUNTER — Ambulatory Visit (INDEPENDENT_AMBULATORY_CARE_PROVIDER_SITE_OTHER): Payer: Medicare Other | Admitting: Internal Medicine

## 2012-12-25 ENCOUNTER — Encounter: Payer: Self-pay | Admitting: Internal Medicine

## 2012-12-25 ENCOUNTER — Ambulatory Visit: Payer: Medicare Other | Admitting: Internal Medicine

## 2012-12-25 VITALS — BP 107/66 | HR 65 | Temp 97.3°F | Wt 123.8 lb

## 2012-12-25 DIAGNOSIS — E876 Hypokalemia: Secondary | ICD-10-CM

## 2012-12-25 DIAGNOSIS — R634 Abnormal weight loss: Secondary | ICD-10-CM

## 2012-12-25 DIAGNOSIS — E41 Nutritional marasmus: Secondary | ICD-10-CM

## 2012-12-25 DIAGNOSIS — D649 Anemia, unspecified: Secondary | ICD-10-CM

## 2012-12-25 DIAGNOSIS — S72009S Fracture of unspecified part of neck of unspecified femur, sequela: Secondary | ICD-10-CM

## 2012-12-25 DIAGNOSIS — D62 Acute posthemorrhagic anemia: Secondary | ICD-10-CM

## 2012-12-25 DIAGNOSIS — E43 Unspecified severe protein-calorie malnutrition: Secondary | ICD-10-CM

## 2012-12-25 DIAGNOSIS — S72002D Fracture of unspecified part of neck of left femur, subsequent encounter for closed fracture with routine healing: Secondary | ICD-10-CM

## 2012-12-25 DIAGNOSIS — S72142S Displaced intertrochanteric fracture of left femur, sequela: Secondary | ICD-10-CM

## 2012-12-25 DIAGNOSIS — S72009D Fracture of unspecified part of neck of unspecified femur, subsequent encounter for closed fracture with routine healing: Secondary | ICD-10-CM

## 2012-12-25 DIAGNOSIS — R7309 Other abnormal glucose: Secondary | ICD-10-CM

## 2012-12-25 DIAGNOSIS — Z79899 Other long term (current) drug therapy: Secondary | ICD-10-CM

## 2012-12-25 DIAGNOSIS — R609 Edema, unspecified: Secondary | ICD-10-CM

## 2012-12-25 DIAGNOSIS — R739 Hyperglycemia, unspecified: Secondary | ICD-10-CM

## 2012-12-25 LAB — CBC
MCH: 28.2 pg (ref 26.0–34.0)
MCHC: 33 g/dL (ref 30.0–36.0)
MCV: 85.4 fL (ref 78.0–100.0)
Platelets: 224 10*3/uL (ref 150–400)
RBC: 3.9 MIL/uL (ref 3.87–5.11)
RDW: 15.4 % (ref 11.5–15.5)

## 2012-12-25 LAB — POCT GLYCOSYLATED HEMOGLOBIN (HGB A1C): Hemoglobin A1C: 5

## 2012-12-25 LAB — GLUCOSE, CAPILLARY: Glucose-Capillary: 75 mg/dL (ref 70–99)

## 2012-12-25 MED ORDER — HYDROCODONE-ACETAMINOPHEN 5-325 MG PO TABS: 1.0000 | ORAL_TABLET | Freq: Four times a day (QID) | ORAL | Status: DC | PRN

## 2012-12-25 MED ORDER — POTASSIUM CHLORIDE ER 10 MEQ PO TBCR: 20.0000 meq | EXTENDED_RELEASE_TABLET | Freq: Two times a day (BID) | ORAL | Status: DC

## 2012-12-25 NOTE — Patient Instructions (Addendum)
-  Follow up with your surgeon before laying on your left side.  -Continue eating a gluten free diet and supplementing the diet with Ensure.  -Keep your legs elevated, wear compression stockings, and wrap your feet with ACE band as needed to the swelling of your feet.  -Follow up with Dr. Sherrine Maples, your PCP, in 1-2 months.

## 2012-12-26 LAB — ANEMIA PANEL
%SAT: 15 % — ABNORMAL LOW (ref 20–55)
ABS Retic: 46.8 10*3/uL (ref 19.0–186.0)
Ferritin: 54 ng/mL (ref 10–291)
Folate: 18.2 ng/mL
TIBC: 273 ug/dL (ref 250–470)
Vitamin B-12: 240 pg/mL (ref 211–911)

## 2012-12-26 LAB — COMPLETE METABOLIC PANEL WITH GFR
ALT: 8 U/L (ref 0–35)
AST: 10 U/L (ref 0–37)
Albumin: 2.9 g/dL — ABNORMAL LOW (ref 3.5–5.2)
CO2: 24 mEq/L (ref 19–32)
Calcium: 8 mg/dL — ABNORMAL LOW (ref 8.4–10.5)
Chloride: 110 mEq/L (ref 96–112)
Creat: 1.18 mg/dL — ABNORMAL HIGH (ref 0.50–1.10)
GFR, Est African American: 49 mL/min — ABNORMAL LOW
Potassium: 3.6 mEq/L (ref 3.5–5.3)

## 2012-12-27 NOTE — Assessment & Plan Note (Addendum)
Checked CBC during this visit with Hg trend up to 11 from ~9 during recent hospitalization.  Checked anemia panel. Pt has low iron level but nl/low ferritin of >50. This is likely anemia of chronic disease in the elderly and does not warrant iron supplementation.

## 2012-12-27 NOTE — Assessment & Plan Note (Addendum)
CMP with albumin trended up to 2.9 from 1.9 in July 2014. Pt reports increased appetite, continues to supplement her diet with Ensure as tolerated. Trying to adhere to a strict gluten free diet.

## 2012-12-27 NOTE — Assessment & Plan Note (Addendum)
Chronic problem likely secondary effect from intermittent diarrhea with discrepancy from gluten free diet.  CMP with K wnl with ongoing daily supplementation with Kdur BID, will continue this tx for now.

## 2012-12-27 NOTE — Assessment & Plan Note (Signed)
2+ pitting edema, symmetric, bilaterally, likely dependent edema. Pt has compression stockings, does not wear them every day.  -Pt advised to wear compression stockings, to keep legs elevated when resting, and to wrap feet with ACE band x 2 days to reduce the swelling.

## 2012-12-27 NOTE — Progress Notes (Signed)
  Subjective:    Patient ID: Rachel Bridges, female    DOB: [birth year], 79 y.o.   MRN: [id number]  HPI Rachel Bridges is an 79 year old woman with PMH of dementia, CKD stage 3, gluten intolerance, protein malnutrition who comes in for evaluation after discharge from Rehab for left hip fracture s/p internal fixation. She is accompanied by her daughter. Rachel Bridges states that she feels fine, with an increased appetite, and slow improvement of her mobility as she undergoes HH PT. Her daughter confirms that Rachel Bridges has been eating well and doing well with HH PT.  She notes though, that her mother has had bilateral feet edema. The edema improves with compression stockings and leg elevation.    Review of Systems  Constitutional: Negative for fever, chills, diaphoresis, activity change, appetite change, fatigue and unexpected weight change.  Respiratory: Negative for cough, shortness of breath and wheezing.   Cardiovascular: Positive for leg swelling. Negative for chest pain and palpitations.  Gastrointestinal: Positive for diarrhea. Negative for nausea, vomiting, abdominal pain, constipation and blood in stool.       Intermittent diarrhea secondary to gluten intolerance and diet indiscretion.   Genitourinary: Negative for dysuria and frequency.  Musculoskeletal: Positive for arthralgias and gait problem.       Left hip pain  Skin: Negative for color change, pallor, rash and wound.  Allergic/Immunologic:       Gluten intolerance  Neurological: Negative for dizziness, syncope, speech difficulty and headaches.  Hematological: Negative for adenopathy.  Psychiatric/Behavioral: Negative for agitation.       Pleasantly demented.        Objective:   Physical Exam  Nursing note and vitals reviewed. Constitutional: She appears well-developed and well-nourished. No distress.     HENT:  Head: Atraumatic.  Eyes: Conjunctivae are normal. No scleral icterus.  Cardiovascular: Normal rate and regular  rhythm.   Pulmonary/Chest: Effort normal. No respiratory distress. She has no wheezes. She has no rales.  Abdominal: Soft. Bowel sounds are normal. She exhibits distension. She exhibits no mass. There is no tenderness. There is no rebound and no guarding.  Abdominal distention at her baseline  Musculoskeletal: Normal range of motion. She exhibits edema. She exhibits no tenderness.  Bilateral 2+ pitting edema of her feet up to her ankles with chronic venous stasis changes but intact skin.   Feet are warm to the touch, non tender, with no erythema. DP and PT pulses are present and symmetric.  Left hip with well healed surgical scar, no TTP, no erythema, edema, or increased warmth.  Neurological: She is alert. Coordination abnormal.  Skin: Skin is warm and dry. No rash noted. She is not diaphoretic.  Psychiatric: She has a normal mood and affect.  Pleasantly demented.           Assessment & Plan:

## 2012-12-27 NOTE — Assessment & Plan Note (Signed)
Her weight is stable at 123lbs.

## 2012-12-27 NOTE — Assessment & Plan Note (Addendum)
Pt is recovering well with well healed incision/scar. She is undergoing HH PT and regaining her mobility. Her pain is improving, she has not required her pain meds every day.  She may benefit from bisphosphate therapy 8 weeks post op but risk/benfit discussion is warranted for possible side effects.  Refilled Vicodin 5-325mg  q6h PRN for pain #20.

## 2012-12-29 ENCOUNTER — Telehealth: Payer: Self-pay | Admitting: *Deleted

## 2012-12-29 NOTE — Telephone Encounter (Signed)
Call from Minerva Areola, Physical Therapist with [name] [phone] He is asking for Verbal Order  to extent home health aid orders for bathing and dressing. 2 times a week for 4 weeks. Pt had a fall yesterday, scrapped her back.  No other injuries.  Will this be okay?

## 2012-12-30 NOTE — Telephone Encounter (Signed)
Yes. That will be good. Thanks.

## 2012-12-30 NOTE — Telephone Encounter (Signed)
Okay given to PT

## 2012-12-31 ENCOUNTER — Telehealth: Payer: Self-pay | Admitting: *Deleted

## 2012-12-31 NOTE — Telephone Encounter (Signed)
Verbal was given for continuing personal care services for pt, do you approve?

## 2013-01-01 NOTE — Progress Notes (Signed)
Case discussed with Dr. Kennerly soon after the resident saw the patient.  We reviewed the resident's history and exam and pertinent patient test results.  I agree with the assessment, diagnosis, and plan of care documented in the resident's note. 

## 2013-01-01 NOTE — Telephone Encounter (Signed)
Yes. Thanks 

## 2013-01-11 ENCOUNTER — Other Ambulatory Visit: Payer: Self-pay | Admitting: Internal Medicine

## 2013-01-27 ENCOUNTER — Telehealth: Payer: Self-pay | Admitting: *Deleted

## 2013-01-27 ENCOUNTER — Ambulatory Visit: Payer: Medicare Other | Admitting: Internal Medicine

## 2013-01-27 NOTE — Telephone Encounter (Signed)
Rachel Bridges with [name] [phone] called - daughter c/o of blisters on feet and toes. Noted 01/26/13 with 1 blister now has multi blisters as noted. Offered appt 01/27/13 at 3:30PM - pt is at the orthoped  doctor.  Daughter may ask for them to check areas. Appt made for Dr Manson Passey 01/28/13 [name] with [name] aware. [name] [name] RN 01/27/13 3PM

## 2013-01-28 ENCOUNTER — Ambulatory Visit (INDEPENDENT_AMBULATORY_CARE_PROVIDER_SITE_OTHER): Payer: Medicare Other | Admitting: Internal Medicine

## 2013-01-28 ENCOUNTER — Encounter: Payer: Self-pay | Admitting: Internal Medicine

## 2013-01-28 VITALS — BP 102/64 | HR 83 | Temp 96.3°F | Ht 67.0 in | Wt 122.9 lb

## 2013-01-28 DIAGNOSIS — N183 Chronic kidney disease, stage 3 (moderate): Secondary | ICD-10-CM

## 2013-01-28 DIAGNOSIS — IMO0002 Reserved for concepts with insufficient information to code with codable children: Secondary | ICD-10-CM

## 2013-01-28 DIAGNOSIS — S90821A Blister (nonthermal), right foot, initial encounter: Secondary | ICD-10-CM | POA: Insufficient documentation

## 2013-01-28 LAB — BASIC METABOLIC PANEL
BUN: 20 mg/dL (ref 6–23)
Calcium: 8.2 mg/dL — ABNORMAL LOW (ref 8.4–10.5)
Chloride: 111 mEq/L (ref 96–112)
Creat: 1.2 mg/dL — ABNORMAL HIGH (ref 0.50–1.10)

## 2013-01-28 MED ORDER — FUROSEMIDE 20 MG PO TABS: 20.0000 mg | ORAL_TABLET | Freq: Every day | ORAL | Status: DC

## 2013-01-28 NOTE — Patient Instructions (Signed)
General Instructions: The blisters on your feet are caused by a combination of the swelling in your feet, as well as "shear" forces on your feet (such as the pulling of the outer layer of skin with application of an ACE bandage).  To treat this: -increase your Lasix to 40 mg every morning, and 20 mg around 1:00 pm.  Once the swelling resolves, you may go back to 20 mg daily. -keep the feet wrapped, but keep a layer of clothing (such as a sock or stocking) between the ACE bandage and the skin -keep your feet elevated to above the level of your heart -avoid excess salt in your diet  Please return for a follow-up visit in 4 weeks, or sooner if the blisters do not resolve.   Treatment Goals:  Goals (1 Years of Data) as of 01/28/13     Lifestyle    . Prevent Falls       Progress Toward Treatment Goals:  Treatment Goal 01/28/2013  Prevent falls unchanged    Self Care Goals & Plans:  Self Care Goal 01/28/2013  Manage my medications take my medicines as prescribed  Eat healthy foods eat more vegetables; eat baked foods instead of fried foods; eat foods that are low in salt  Prevent falls -       Care Management & Community Referrals:  Referral 01/28/2013  Referrals made for care management support none needed

## 2013-01-28 NOTE — Progress Notes (Signed)
HPI The patient is a 79 y.o. female with a history of CKD, dCHF, adrenal insufficiency, monoclonal gammopathy, psoriasis, presenting for an acute visit for blisters.  The patient has a history of chronic venous insufficiency, with intermittent swelling of her bilateral lower extremities.  4 days ago, the patient noticed increase in swelling in her bilateral lower extremities. The patient increased her Lasix dosage from 20 mg daily to 40 mg daily, and started wrapping the area with an Ace bandage. However, the swelling persisted. 2 days ago, the patient noticed 2 large blisters on her right dorsal foot, with no pain, erythema, purulence, or fevers. The patient notes no trauma or recent medication change. Yesterday, the patient notes that the blisters have enlarged, with 2 new blisters appearing. The patient has a history of diastolic CHF, EF of 60-65%, but notes no dyspnea, orthopnea, or PND.  ROS: General: no fevers, chills, changes in weight, changes in appetite Skin: no rash HEENT: no blurry vision, hearing changes, sore throat Pulm: no dyspnea, coughing, wheezing CV: no chest pain, palpitations, shortness of breath Abd: no abdominal pain, nausea/vomiting, diarrhea/constipation GU: no dysuria, hematuria, polyuria Ext: see HPI Neuro: no weakness, numbness, or tingling  Filed Vitals:   01/28/13 1433  BP: 102/64  Pulse: 83  Temp: 96.3 F (35.7 C)    PEX General: alert, cooperative, and in no apparent distress HEENT: pupils equal round and reactive to light, vision grossly intact, oropharynx clear and non-erythematous  Neck: supple, no lymphadenopathy Lungs: clear to ascultation bilaterally, normal work of respiration, no wheezes, rales, ronchi Heart: regular rate and rhythm, no murmurs, gallops, or rubs Abdomen: soft, non-tender, non-distended, normal bowel sounds Extremities: Bilateral lower extremities with 2+ pitting edema to the level of the midshin. Right dorsal foot with 4 large  clear bullae, the largest of which measures approximately 4 x 3 cm. No surrounding erythema  Neurologic: alert & oriented X3, cranial nerves II-XII intact, strength grossly intact, sensation intact to light touch  Current Outpatient Prescriptions on File Prior to Visit  Medication Sig Dispense Refill  . acetaminophen (TYLENOL) 325 MG tablet Take 325 mg by mouth daily as needed. For pain      . aspirin 81 MG tablet Take 81 mg by mouth daily.       . calcium citrate-vitamin D (CITRACAL+D) 315-200 MG-UNIT per tablet Take 1 tablet by mouth 2 (two) times daily.  60 tablet  11  . cyanocobalamin (,VITAMIN B-12,) 1000 MCG/ML injection Inject 1 mL (1,000 mcg total) into the muscle every 30 (thirty) days.  1 mL  11  . feeding supplement (ENSURE COMPLETE) LIQD Take 237 mLs by mouth daily. Vanilla.      . furosemide (LASIX) 20 MG tablet Take 1 tablet (20 mg total) by mouth daily.  45 tablet  5  . HYDROcodone-acetaminophen (NORCO) 5-325 MG per tablet Take 1-2 tablets by mouth every 6 (six) hours as needed for pain. MAXIMUM TOTAL ACETAMINOPHEN DOSE IS 4000 MG PER DAY  20 tablet  0  . hydrocortisone (CORTEF) 10 MG tablet Take 5-10 mg by mouth 2 (two) times daily. 10 mg in the a.m. And 5 mg in the afternoon.      . nystatin (MYCOSTATIN/NYSTOP) 100000 UNIT/GM POWD Apply to affected skin as needed for yeast infection in skin folds.  1 Bottle  0  . ondansetron (ZOFRAN ODT) 8 MG disintegrating tablet Take 1 tablet (8 mg total) by mouth every 8 (eight) hours as needed for nausea.  10 tablet  0  .  paricalcitol (ZEMPLAR) 1 MCG capsule Take 1 mcg by mouth every Monday, Wednesday, and Friday.      . potassium chloride (K-DUR) 10 MEQ tablet Take 2 tablets (20 mEq total) by mouth 2 (two) times daily.  60 tablet  3  . sertraline (ZOLOFT) 50 MG tablet TAKE 1 TABLET BY MOUTH EVERY DAY  90 tablet  0  . sertraline (ZOLOFT) 50 MG tablet TAKE 1 TABLET BY MOUTH EVERY DAY  30 tablet  6  . simethicone (MYLICON) 80 MG chewable tablet  Chew 160 mg by mouth daily as needed. For flatulence       No current facility-administered medications on file prior to visit.    Assessment/Plan

## 2013-01-28 NOTE — Assessment & Plan Note (Addendum)
The patient notes development of several blisters on her right foot, in the setting of recent lower extremity edema, as well as wrapping with an Ace bandage on bare skin. I believe the patient's blisters represents a combination of edema and sheer stress. No fever or edema to suggest infection. -Increase Lasix from 40 mg daily, to 40 mg in the morning, 20 mg at lunchtime -Continue to wrap feet with Ace bandage, though with a sock barrier between the bandage and the patient's skin.  Avoid directly wrapping the blisters themselves -Keep feet elevated -Decrease salt intake -Will check BMET today, given chronic kidney disease and recent increased Lasix usage

## 2013-02-01 NOTE — Progress Notes (Signed)
I saw and evaluated the patient. I personally confirmed the key portions of Dr. Brown's history and exam and reviewed pertinent patient test results. The assessment, diagnosis, and plan were formulated together and I agree with the documentation in the resident's note. 

## 2013-02-08 ENCOUNTER — Telehealth: Payer: Self-pay | Admitting: Internal Medicine

## 2013-02-08 NOTE — Telephone Encounter (Signed)
s.w. pt daughter and advised on 10.29.14 appt time change...ok and aware...per Dr. Kerry Fort pal sheet sched with Forde Radon with covering MD

## 2013-02-16 ENCOUNTER — Other Ambulatory Visit: Payer: Self-pay | Admitting: *Deleted

## 2013-02-16 DIAGNOSIS — D472 Monoclonal gammopathy: Secondary | ICD-10-CM

## 2013-02-17 ENCOUNTER — Other Ambulatory Visit (HOSPITAL_BASED_OUTPATIENT_CLINIC_OR_DEPARTMENT_OTHER): Payer: Medicare Other | Admitting: Lab

## 2013-02-17 ENCOUNTER — Ambulatory Visit (HOSPITAL_COMMUNITY)
Admission: RE | Admit: 2013-02-17 | Discharge: 2013-02-17 | Disposition: A | Discharge status: Home / Self Care | Payer: Medicare Other | Source: Ambulatory Visit | Attending: Hematology and Oncology | Admitting: Hematology and Oncology

## 2013-02-17 DIAGNOSIS — Z96659 Presence of unspecified artificial knee joint: Secondary | ICD-10-CM | POA: Insufficient documentation

## 2013-02-17 DIAGNOSIS — D472 Monoclonal gammopathy: Secondary | ICD-10-CM | POA: Insufficient documentation

## 2013-02-17 DIAGNOSIS — Z981 Arthrodesis status: Secondary | ICD-10-CM | POA: Insufficient documentation

## 2013-02-17 DIAGNOSIS — Z96649 Presence of unspecified artificial hip joint: Secondary | ICD-10-CM | POA: Insufficient documentation

## 2013-02-17 DIAGNOSIS — D539 Nutritional anemia, unspecified: Secondary | ICD-10-CM

## 2013-02-17 DIAGNOSIS — M899 Disorder of bone, unspecified: Secondary | ICD-10-CM | POA: Insufficient documentation

## 2013-02-17 LAB — CBC & DIFF AND RETIC
BASO%: 0.3 % (ref 0.0–2.0)
Basophils Absolute: 0 10*3/uL (ref 0.0–0.1)
EOS%: 1.5 % (ref 0.0–7.0)
HCT: 35.4 % (ref 34.8–46.6)
HGB: 11.2 g/dL — ABNORMAL LOW (ref 11.6–15.9)
Immature Retic Fract: 8.7 % (ref 1.60–10.00)
MCH: 27.8 pg (ref 25.1–34.0)
MCHC: 31.6 g/dL (ref 31.5–36.0)
MONO#: 0.5 10*3/uL (ref 0.1–0.9)
RDW: 16.2 % — ABNORMAL HIGH (ref 11.2–14.5)
Retic Ct Abs: 61.26 10*3/uL (ref 33.70–90.70)
WBC: 6.9 10*3/uL (ref 3.9–10.3)
lymph#: 1.6 10*3/uL (ref 0.9–3.3)

## 2013-02-17 LAB — COMPREHENSIVE METABOLIC PANEL (CC13)
ALT: 7 U/L (ref 0–55)
AST: 10 U/L (ref 5–34)
Creatinine: 1.1 mg/dL (ref 0.6–1.1)
Sodium: 144 mEq/L (ref 136–145)
Total Bilirubin: 0.2 mg/dL (ref 0.20–1.20)
Total Protein: 5.8 g/dL — ABNORMAL LOW (ref 6.4–8.3)

## 2013-02-19 LAB — PROTEIN ELECTROPHORESIS, SERUM
Albumin ELP: 43.7 % — ABNORMAL LOW (ref 55.8–66.1)
Alpha-1-Globulin: 9.4 % — ABNORMAL HIGH (ref 2.9–4.9)
Beta 2: 7 % — ABNORMAL HIGH (ref 3.2–6.5)

## 2013-02-24 ENCOUNTER — Ambulatory Visit: Payer: Medicare Other | Admitting: Internal Medicine

## 2013-02-24 ENCOUNTER — Ambulatory Visit: Payer: Medicare Other | Admitting: Hematology and Oncology

## 2013-02-24 ENCOUNTER — Ambulatory Visit: Payer: Medicare Other | Admitting: Physician Assistant

## 2013-03-04 ENCOUNTER — Encounter: Payer: Medicare Other | Admitting: Internal Medicine

## 2013-03-04 ENCOUNTER — Encounter: Payer: Self-pay | Admitting: Internal Medicine

## 2013-03-04 ENCOUNTER — Other Ambulatory Visit: Payer: Self-pay

## 2013-03-09 ENCOUNTER — Encounter: Payer: Self-pay | Admitting: Internal Medicine

## 2013-03-09 ENCOUNTER — Ambulatory Visit (INDEPENDENT_AMBULATORY_CARE_PROVIDER_SITE_OTHER): Payer: Medicare Other | Admitting: Internal Medicine

## 2013-03-09 VITALS — BP 90/61 | HR 65 | Temp 97.4°F | Wt 123.1 lb

## 2013-03-09 DIAGNOSIS — R5383 Other fatigue: Secondary | ICD-10-CM

## 2013-03-09 DIAGNOSIS — R197 Diarrhea, unspecified: Secondary | ICD-10-CM

## 2013-03-09 DIAGNOSIS — R5381 Other malaise: Secondary | ICD-10-CM

## 2013-03-09 LAB — BASIC METABOLIC PANEL WITH GFR
BUN: 23 mg/dL (ref 6–23)
CO2: 24 mEq/L (ref 19–32)
Chloride: 108 mEq/L (ref 96–112)
Creat: 1.37 mg/dL — ABNORMAL HIGH (ref 0.50–1.10)
Glucose, Bld: 102 mg/dL — ABNORMAL HIGH (ref 70–99)

## 2013-03-09 NOTE — Progress Notes (Signed)
Subjective:     Patient ID: Rachel Bridges, female   DOB: July 19, 1924, 79 y.o.   MRN: [id number]  HPI The patient is an 79 YO female coming in for a sick visit today. She has PMH of CKD stage III, edema in her legs, adrenal insufficiency, monoclonal gammopathy. She states that she is having some diarrhea recently. She does have a history of intermittent diarrhea with gluten ingestion. She has been placed on gluten free diet before however with her dementia her daughter helps to manage. However she was in the hospital last week and her husband did give her doughnuts and she started having diarrhea. Her daughter feels that she is not the same. She does not have new cough, chills, fevers, abdominal pain. She does not have dysuria, frequency. She does have chronic incontinence. She has not had any changes to her medications recently. Her legs are a lot less swollen than they used to be and her blister is healing. No yeast infections or wounds at this time. She denies any complaints. She is having slight pain in her hip and has appointment with the doctor tomorrow. Some mild congestion with minimal drainage.   Review of Systems  Constitutional: Positive for activity change. Negative for fever, chills, appetite change and fatigue.  HENT: Positive for congestion. Negative for dental problem, drooling, ear discharge, ear pain, facial swelling, hearing loss, mouth sores, nosebleeds, postnasal drip, rhinorrhea, sinus pressure, sneezing, sore throat, tinnitus, trouble swallowing and voice change.   Respiratory: Negative for cough, chest tightness, shortness of breath and wheezing.   Cardiovascular: Negative for chest pain, palpitations and leg swelling.       No blood in the diarrhea.   Gastrointestinal: Positive for diarrhea. Negative for nausea, vomiting, abdominal pain, constipation and abdominal distention.  Skin: Positive for color change and wound.  Neurological: Negative for dizziness, tremors, seizures,  syncope, facial asymmetry, speech difficulty, weakness, light-headedness, numbness and headaches.       Objective:   Physical Exam  Constitutional: She is oriented to person, place, and time. She appears well-developed and well-nourished. No distress.  Pleasantly demented  HENT:  Head: Normocephalic and atraumatic.  Eyes: EOM are normal. Pupils are equal, round, and reactive to light. Right eye exhibits no discharge. Left eye exhibits no discharge.  Neck: Normal range of motion. Neck supple.  Cardiovascular: Normal rate and regular rhythm.   No murmur heard. Pulmonary/Chest: Breath sounds normal. No respiratory distress. She has no wheezes. She has no rales. She exhibits no tenderness.  Slightly poor effort  Abdominal: Soft. Bowel sounds are normal. She exhibits no distension. There is no tenderness. There is no rebound and no guarding.  Musculoskeletal: Normal range of motion.  Neurological: She is alert and oriented to person, place, and time.  Skin: Skin is warm and dry. She is not diaphoretic.  Some chronic color changes in the feet from previous edema. Minimal edema in the legs at today's visit bilaterally. There is a covered wound on the right foot laterally. No signs of infection. No wounds on the left foot at all       Assessment/Plan:    1. Diarrhea - Likely from the gluten indiscretion. Will put back on gluten free diet and check BMP for electrolyte abnormalities.   2. Not feeling well - Will check a glucose level and BMP and urine study to rule out urine infection. Her lungs sound clear and one blister on right foot not infected. No abdominal tenderness. No clear signs of  infection and will hold off on antibiotics at this time as this could cause worsening of her diarrhea. She is advised to return in 5-7 days if not doing better or as scheduled if she recovers. Will allow the doctor to prescribe her pain medication. He will also be able to evaluate the surgical site. Some mild  congestion and she still start taking claritin at home.

## 2013-03-09 NOTE — Patient Instructions (Signed)
We will check on your kidneys and your sugar today. We will not start antibiotics today since we have no clear signs of infection.  If you are not getting better or you worsen please call and return to the clinic. If you are not your usual self by next Monday call the clinic to return. If you are better then follow up as your doctor wants you to come back.  Call us at [phone] with questions or problems.

## 2013-03-12 ENCOUNTER — Telehealth: Payer: Self-pay | Admitting: Internal Medicine

## 2013-03-12 NOTE — Progress Notes (Signed)
Case discussed with Dr. Dorise Hiss soon after the resident saw the patient.  We reviewed the resident's history and exam and pertinent patient test results.  I agree with the assessment, diagnosis, and plan of care documented in the resident's note.  If patient continues to have diarrhea, other causes of chronic diarrhea will need to be assessed.

## 2013-03-12 NOTE — Telephone Encounter (Signed)
Returned call and s/w dtr demetrice re appt for 11/18 @ 10:30am.

## 2013-03-16 ENCOUNTER — Ambulatory Visit: Payer: Medicare Other

## 2013-03-16 ENCOUNTER — Other Ambulatory Visit: Payer: Self-pay

## 2013-03-17 ENCOUNTER — Telehealth: Payer: Self-pay | Admitting: Internal Medicine

## 2013-03-17 NOTE — Telephone Encounter (Signed)
[facility] adv appts 11/21 and 11/28 per POF dated 11/18 CAL mailed shh

## 2013-03-19 ENCOUNTER — Other Ambulatory Visit: Payer: Medicare Other

## 2013-03-19 ENCOUNTER — Other Ambulatory Visit: Payer: Self-pay | Admitting: Internal Medicine

## 2013-03-19 DIAGNOSIS — D472 Monoclonal gammopathy: Secondary | ICD-10-CM

## 2013-03-25 ENCOUNTER — Emergency Department (HOSPITAL_COMMUNITY)
Admission: EM | Admit: 2013-03-25 | Discharge: 2013-03-26 | Disposition: A | Discharge status: Home / Self Care | Payer: Medicare Other | Attending: Emergency Medicine | Admitting: Emergency Medicine

## 2013-03-25 ENCOUNTER — Emergency Department (HOSPITAL_COMMUNITY): Payer: Medicare Other

## 2013-03-25 ENCOUNTER — Telehealth: Payer: Self-pay | Admitting: Internal Medicine

## 2013-03-25 ENCOUNTER — Encounter (HOSPITAL_COMMUNITY): Payer: Self-pay | Admitting: Emergency Medicine

## 2013-03-25 DIAGNOSIS — M899 Disorder of bone, unspecified: Secondary | ICD-10-CM | POA: Insufficient documentation

## 2013-03-25 DIAGNOSIS — N182 Chronic kidney disease, stage 2 (mild): Secondary | ICD-10-CM | POA: Insufficient documentation

## 2013-03-25 DIAGNOSIS — F329 Major depressive disorder, single episode, unspecified: Secondary | ICD-10-CM | POA: Insufficient documentation

## 2013-03-25 DIAGNOSIS — R109 Unspecified abdominal pain: Secondary | ICD-10-CM | POA: Insufficient documentation

## 2013-03-25 DIAGNOSIS — R296 Repeated falls: Secondary | ICD-10-CM | POA: Insufficient documentation

## 2013-03-25 DIAGNOSIS — F411 Generalized anxiety disorder: Secondary | ICD-10-CM | POA: Insufficient documentation

## 2013-03-25 DIAGNOSIS — R141 Gas pain: Secondary | ICD-10-CM | POA: Insufficient documentation

## 2013-03-25 DIAGNOSIS — K900 Celiac disease: Secondary | ICD-10-CM | POA: Insufficient documentation

## 2013-03-25 DIAGNOSIS — F0390 Unspecified dementia without behavioral disturbance: Secondary | ICD-10-CM | POA: Insufficient documentation

## 2013-03-25 DIAGNOSIS — Z7982 Long term (current) use of aspirin: Secondary | ICD-10-CM | POA: Insufficient documentation

## 2013-03-25 DIAGNOSIS — Z885 Allergy status to narcotic agent status: Secondary | ICD-10-CM | POA: Insufficient documentation

## 2013-03-25 DIAGNOSIS — M129 Arthropathy, unspecified: Secondary | ICD-10-CM | POA: Insufficient documentation

## 2013-03-25 DIAGNOSIS — M25559 Pain in unspecified hip: Secondary | ICD-10-CM | POA: Insufficient documentation

## 2013-03-25 DIAGNOSIS — Z79899 Other long term (current) drug therapy: Secondary | ICD-10-CM | POA: Insufficient documentation

## 2013-03-25 DIAGNOSIS — Z8742 Personal history of other diseases of the female genital tract: Secondary | ICD-10-CM | POA: Insufficient documentation

## 2013-03-25 DIAGNOSIS — F3289 Other specified depressive episodes: Secondary | ICD-10-CM | POA: Insufficient documentation

## 2013-03-25 DIAGNOSIS — M48061 Spinal stenosis, lumbar region without neurogenic claudication: Secondary | ICD-10-CM | POA: Insufficient documentation

## 2013-03-25 DIAGNOSIS — N393 Stress incontinence (female) (male): Secondary | ICD-10-CM | POA: Insufficient documentation

## 2013-03-25 DIAGNOSIS — Z85038 Personal history of other malignant neoplasm of large intestine: Secondary | ICD-10-CM | POA: Insufficient documentation

## 2013-03-25 DIAGNOSIS — Y92009 Unspecified place in unspecified non-institutional (private) residence as the place of occurrence of the external cause: Secondary | ICD-10-CM | POA: Insufficient documentation

## 2013-03-25 DIAGNOSIS — G8929 Other chronic pain: Secondary | ICD-10-CM | POA: Insufficient documentation

## 2013-03-25 DIAGNOSIS — Z8739 Personal history of other diseases of the musculoskeletal system and connective tissue: Secondary | ICD-10-CM | POA: Insufficient documentation

## 2013-03-25 DIAGNOSIS — M25519 Pain in unspecified shoulder: Secondary | ICD-10-CM | POA: Insufficient documentation

## 2013-03-25 DIAGNOSIS — Y9389 Activity, other specified: Secondary | ICD-10-CM | POA: Insufficient documentation

## 2013-03-25 DIAGNOSIS — D472 Monoclonal gammopathy: Secondary | ICD-10-CM | POA: Insufficient documentation

## 2013-03-25 DIAGNOSIS — Z8719 Personal history of other diseases of the digestive system: Secondary | ICD-10-CM | POA: Insufficient documentation

## 2013-03-25 DIAGNOSIS — Z8709 Personal history of other diseases of the respiratory system: Secondary | ICD-10-CM | POA: Insufficient documentation

## 2013-03-25 DIAGNOSIS — Z87891 Personal history of nicotine dependence: Secondary | ICD-10-CM | POA: Insufficient documentation

## 2013-03-25 DIAGNOSIS — E2749 Other adrenocortical insufficiency: Secondary | ICD-10-CM | POA: Insufficient documentation

## 2013-03-25 DIAGNOSIS — W19XXXA Unspecified fall, initial encounter: Secondary | ICD-10-CM

## 2013-03-25 DIAGNOSIS — Z888 Allergy status to other drugs, medicaments and biological substances status: Secondary | ICD-10-CM | POA: Insufficient documentation

## 2013-03-25 DIAGNOSIS — Z9181 History of falling: Secondary | ICD-10-CM | POA: Insufficient documentation

## 2013-03-25 DIAGNOSIS — R142 Eructation: Secondary | ICD-10-CM | POA: Insufficient documentation

## 2013-03-25 LAB — BASIC METABOLIC PANEL
BUN: 22 mg/dL (ref 6–23)
Creatinine, Ser: 1.38 mg/dL — ABNORMAL HIGH (ref 0.50–1.10)
GFR calc non Af Amer: 34 mL/min — ABNORMAL LOW (ref >90)
Glucose, Bld: 95 mg/dL (ref 70–99)
Potassium: 3.8 mEq/L (ref 3.5–5.1)

## 2013-03-25 LAB — CBC
HCT: 36.2 % (ref 36.0–46.0)
Hemoglobin: 12 g/dL (ref 12.0–15.0)
MCH: 29 pg (ref 26.0–34.0)
MCHC: 33.1 g/dL (ref 30.0–36.0)
MCV: 87.4 fL (ref 78.0–100.0)

## 2013-03-25 NOTE — ED Provider Notes (Signed)
CSN: [id number]     Arrival date & time 03/25/13  2044 History   First MD Initiated Contact with Patient 03/25/13 2118     Chief Complaint  Patient presents with  . Shoulder Pain   (Consider location/radiation/quality/duration/timing/severity/associated sxs/prior Treatment) HPI Comments: 39 female comes emergency Department after fall. Was getting into bed reports having fallen from standing. Patient is a poor story and. However she states she remembers the whole fall. She states she is not hurting anywhere. Family wanted her to get checked out. Family states left scapula is protruding further than right scapula which worried daughter. Patient does not endorse loss of consciousness, syncope, lightheadedness, dizziness etc.   Patient is a 79 y.o. female presenting with fall.  Fall This is a new problem. The current episode started today. Episode frequency: once. The problem has been resolved. Pertinent negatives include no chest pain, chills, coughing, headaches, nausea, numbness or rash.    Past Medical History  Diagnosis Date  . MGUS (monoclonal gammopathy of unknown significance)      [facility]  . Anxiety   . Depression   . Diverticulosis of colon   . Osteopenia   . Lumbar spinal stenosis   . Atrophic vaginitis   . History of tobacco abuse     at least 64 pack year. Started about age 21 and continued until about age 89.  Marland Kitchen Dermatitis   . Hip pain      History  . Actinic keratosis   . Dementia   . Adrenal suppression     central, on chronic steroids, followed by Dr. Sharl Bridges of endocrinology  . Gluten intolerance     August 2011 endoscopy with biopsy negative for celiac disease. Prior to this patient had elevated TTG IgG of 93 (normal <20). Dr Rachel Bridges made clinical diagnosis of gluten intolerance based on diarrhea/symptoms from eating gluten. Needs GF diet.   Daughter tries to control diet.  . Colon cancer     History of bowel  obstruction. on  treatment with Colace and MiraLax.  followed by Dr. Loreta Bridges and Dr. Purnell Bridges  . Shortness of breath     "sometimes; can happen at any time" (10/14/2012)  . Arthritis     "probably" (10/14/2012)  . Renal cyst   . CKD (chronic kidney disease), stage II     Rachel Bridges 10/14/2012  . Stress incontinence in female   . Subcapital fracture of left hip 11/13/2012    Dr Rachel Bridges   Past Surgical History  Procedure Laterality Date  . Hemicolectomy       Right-sided  . Ovarian cyst removal    . Cataract extraction w/ intraocular lens  implant, bilateral Bilateral   . Knee arthroscopy Left   . Appendectomy    . Carpal tunnel release Left   . Lumbar laminectomy    . Lumbar fusion    . Replacement total knee Left   . Tonsillectomy  1930's  . Hip arthroplasty Left 11/13/2012    Procedure: ARTHROPLASTY BIPOLAR HIP;  Surgeon: Eulas , MD;  Location: [facility] OR;  Service: Orthopedics;  Laterality: Left;   No family history on file. History  Substance Use Topics  . Smoking status: Former Smoker -- 1.00 packs/day for 47 years    Types: Cigarettes    Quit date: 08/01/1988  . Smokeless tobacco: Never Used  . Alcohol Use: No   OB History   Grav Para Term Preterm Abortions TAB SAB Ect Mult Living  Review of Systems  Constitutional: Negative for chills.  Respiratory: Negative for cough.   Cardiovascular: Negative for chest pain.  Gastrointestinal: Negative for nausea.  Genitourinary: Negative for dysuria.  Skin: Negative for rash.  Neurological: Negative for numbness and headaches.  Psychiatric/Behavioral: Negative for behavioral problems and agitation.  All other systems reviewed and are negative.    Allergies  Oxycodone hcl; Ambien; Morphine and related; and Pantoprazole  Home Medications   Current Outpatient Rx  Name  Route  Sig  Dispense  Refill  . acetaminophen (TYLENOL) 325 MG tablet   Oral   Take 325 mg by mouth daily as needed. For pain         . aspirin 81 MG  tablet   Oral   Take 81 mg by mouth daily.          Marland Kitchen EXPIRED: calcium citrate-vitamin D (CITRACAL+D) 315-200 MG-UNIT per tablet   Oral   Take 1 tablet by mouth 2 (two) times daily.   60 tablet   11   . cyanocobalamin (,VITAMIN B-12,) 1000 MCG/ML injection   Intramuscular   Inject 1 mL (1,000 mcg total) into the muscle every 30 (thirty) days.   1 mL   11   . feeding supplement (ENSURE COMPLETE) LIQD   Oral   Take 237 mLs by mouth daily. Vanilla.         . furosemide (LASIX) 20 MG tablet   Oral   Take 1 tablet (20 mg total) by mouth daily.   30 tablet   0   . HYDROcodone-acetaminophen (NORCO) 5-325 MG per tablet   Oral   Take 1-2 tablets by mouth every 6 (six) hours as needed for pain. MAXIMUM TOTAL ACETAMINOPHEN DOSE IS 4000 MG PER DAY   20 tablet   0   . hydrocortisone (CORTEF) 10 MG tablet   Oral   Take 5-10 mg by mouth 2 (two) times daily. 10 mg in the a.m. And 5 mg in the afternoon.         . nystatin (MYCOSTATIN/NYSTOP) 100000 UNIT/GM POWD      Apply to affected skin as needed for yeast infection in skin folds.   1 Bottle   0   . ondansetron (ZOFRAN ODT) 8 MG disintegrating tablet   Oral   Take 1 tablet (8 mg total) by mouth every 8 (eight) hours as needed for nausea.   10 tablet   0   . paricalcitol (ZEMPLAR) 1 MCG capsule   Oral   Take 1 mcg by mouth every Monday, Wednesday, and Friday.         . potassium chloride (K-DUR) 10 MEQ tablet   Oral   Take 2 tablets (20 mEq total) by mouth 2 (two) times daily.   60 tablet   3   . sertraline (ZOLOFT) 50 MG tablet      TAKE 1 TABLET BY MOUTH EVERY DAY   30 tablet   6   . simethicone (MYLICON) 80 MG chewable tablet   Oral   Chew 160 mg by mouth daily as needed. For flatulence          BP 109/73  Pulse 60  Temp(Src) 97.8 F (36.6 C) (Oral)  Resp 18  SpO2 92% Physical Exam  Nursing note and vitals reviewed. Constitutional: She is oriented to person, place, and time. She appears  well-developed and well-nourished.  HENT:  Head: Normocephalic and atraumatic.  No visible deformities or injuries to indicate patient struck head  Eyes:  EOM are normal. Pupils are equal, round, and reactive to light.  Neck: Normal range of motion.  Cardiovascular: Normal rate, regular rhythm and intact distal pulses.   Pulmonary/Chest: Effort normal and breath sounds normal. No respiratory distress.  Abdominal: Soft. She exhibits no distension.  Patient with chronic abdominal pain her patient and family at bedside. Not changed from baseline. No peritonitis, guarding, rebound. No focal site of pain  Musculoskeletal: Normal range of motion.  Left scapula with slight protrusion compared to right. Full range of motion of left shoulder without limitation. No tenderness palpation throughout the entire exam.  Neurological: She is alert and oriented to person, place, and time. No cranial nerve deficit. She exhibits normal muscle tone. Coordination normal.  Neurovascularly intact throughout  Skin: Skin is warm and dry.  Psychiatric: She has a normal mood and affect. Her behavior is normal. Judgment and thought content normal.    ED Course  Procedures (including critical care time) Labs Review Labs Reviewed  CBC - Abnormal; Notable for the following:    RDW 15.9 (*)    All other components within normal limits  BASIC METABOLIC PANEL - Abnormal; Notable for the following:    Creatinine, Ser 1.38 (*)    Calcium 8.2 (*)    GFR calc non Af Amer 34 (*)    GFR calc Af Amer 39 (*)    All other components within normal limits   Imaging Review Dg Chest 2 View  03/25/2013   CLINICAL DATA:  Pain after a fall.  EXAM: CHEST  2 VIEW  COMPARISON:  11/13/2012  FINDINGS: Normal heart size and pulmonary vascularity. Emphysematous changes and fibrosis in the lungs. Bowel distention noted in the upper abdomen. Is been present on previous studies. No focal consolidation. Visualized bones without gross  displacement.  IMPRESSION: Emphysematous changes in the lungs. No evidence of active pulmonary disease. Prominent gas distention of upper abdominal bowel, stable since previous studies.   Electronically Signed   By: Burman Nieves M.D.   On: 03/25/2013 22:47   Dg Pelvis 1-2 Views  03/25/2013   CLINICAL DATA:  Fall tonight.  Right hip pain.  EXAM: PELVIS - 1-2 VIEW  COMPARISON:  CT abdomen and pelvis 10/14/2012  FINDINGS: Prominent gaseous distention of small and large bowel, similar to previous study. This apparently represents a chronic condition. The overlying bowel gas limits visualization of the pelvis. No gross evidence of any displaced pelvic fractures. Right hip is nondisplaced. Left hip hemiarthroplasty. Postoperative changes in the lower lumbar spine.  IMPRESSION: Diffuse gaseous distention of small and large bowel, similar to previous study. No displaced fractures identified.   Electronically Signed   By: Burman Nieves M.D.   On: 03/25/2013 22:38    EKG Interpretation   None       MDM   1. Fall, initial encounter    20F status Rachel Bridges fall at home. Patient states this was a mechanical fall. No report of lightheadedness, syncope, presyncope et Karie Soda. No syncopal workup initiated. Patient denies loss of consciousness. On exam pt has no visible injuries, deformities or other concerning findings. Pt does not endore striking her head when falling. Scalp atraumatic, normal neuro exam without any focal deficits. Not on blood thinners. Acting normally per family. Will be with family tonight. No need for neuro imaging. Remainder of physical exam showed no deformities. Chest is stable no crepitus, deformities or visible injuries. Given fall striking right side chest on the ground obtained chest x-ray to ensure no  broken ribs, pneumothorax or other concerning pathology. Within normal limits. Pelvis x-rays also obtained 2/2 age and previous hip fracture which and was normal. Reexam patient  continues to be in no apparent distress, nontoxic-appearing and at baseline per family. Given no major injuries or deficits were identified on thorough examination feel patient is appropriate for discharge home. Family and patient given return precautions for confusion, ataxia or any other concerning neurological changes. Family voiced understanding patient was discharged no further acute issues  Patient discussed with attending Dr. Anitra Lauth.      Bridgett Larsson, MD 03/25/13 [phone]

## 2013-03-25 NOTE — Telephone Encounter (Signed)
   Reason for call:   I received a call from [place] concerning her mother, Ms. Salvadore Oxford at 7:32 PM indicating that she feel in her bedroom earlier today, unwitnessed, and was found on the bedroom floor by [name]'s husband when she yelled out for help. [name] claim's she may have hit the footboard of the bed, unclear if she hit her head. She claims her mom says she is not in much pain right now, however, her left shoulder looks like "it is sticking out more than the right" and she has some right sided rib pain. The daughter is calling if she should bring her to the ED.    Pertinent Data:   Larey Seat in bedroom unwitnessed today, L shoulder looks misaligned, no visible bleeding  Do not know if she hit her head. Apparently landed on left side and was found on the floor.   R rib pain as well  Daughter can bring to ED   Assessment / Plan / Recommendations:   Ms. Bischoff is a 79 year old female with extensive PMH s/p fall today. Daughter reports she is feeling okay but left shoulder looks misaligned compared to right and she has right sided rib pain.   I have advised [name] that Ms. Chandran should come to the ED for further evaluation of fall, in case of possible fracture, dislocation, or extensive injuries. [name] confirmed that she will bring her to the ED at this time and was able to relay instructions back to me verbally on the phone.   Will forward to PCP   Darden Palmer, MD   03/25/2013, 8:09 PM

## 2013-03-25 NOTE — ED Notes (Signed)
Pt st's she fell and now has pain under left shoulder blade.  No obvious deformity noted.  Pt has full range of motion

## 2013-03-26 ENCOUNTER — Ambulatory Visit: Payer: Medicare Other

## 2013-03-27 NOTE — ED Provider Notes (Signed)
I saw and evaluated the patient, reviewed the resident's note and I agree with the findings and plan.  EKG Interpretation   None       Pt with mechincal fall at home and pt falls often.  Denies LOC or head injury.  No on anticoagulation adn no head or neck pain or trauma.  Plain films neg for rib fractures or hip pathology.  Family at bedside and feel comfortable taking pt home.  Gwyneth Sprout, MD 03/27/13 0930

## 2013-04-06 ENCOUNTER — Encounter: Payer: Self-pay | Admitting: Internal Medicine

## 2013-04-06 ENCOUNTER — Telehealth: Payer: Self-pay | Admitting: Internal Medicine

## 2013-04-06 NOTE — Telephone Encounter (Signed)
received an inbox message labeled pt advice request to r/s pt's appt. s/w dtr re new appts for lb 12/30 @ 2pm and CP1 05/04/13 @ 1pm. lb to be added tomorrow due to template changes.

## 2013-04-07 ENCOUNTER — Encounter: Payer: Self-pay | Admitting: Internal Medicine

## 2013-04-07 ENCOUNTER — Ambulatory Visit (INDEPENDENT_AMBULATORY_CARE_PROVIDER_SITE_OTHER): Payer: Medicare Other | Admitting: Internal Medicine

## 2013-04-07 VITALS — BP 104/80 | HR 87 | Temp 96.9°F | Wt 122.7 lb

## 2013-04-07 DIAGNOSIS — F329 Major depressive disorder, single episode, unspecified: Secondary | ICD-10-CM

## 2013-04-07 DIAGNOSIS — F3289 Other specified depressive episodes: Secondary | ICD-10-CM

## 2013-04-07 DIAGNOSIS — Z23 Encounter for immunization: Secondary | ICD-10-CM

## 2013-04-07 DIAGNOSIS — S72142S Displaced intertrochanteric fracture of left femur, sequela: Secondary | ICD-10-CM

## 2013-04-07 DIAGNOSIS — R627 Adult failure to thrive: Secondary | ICD-10-CM

## 2013-04-07 DIAGNOSIS — T148XXA Other injury of unspecified body region, initial encounter: Secondary | ICD-10-CM

## 2013-04-07 DIAGNOSIS — S72009S Fracture of unspecified part of neck of unspecified femur, sequela: Secondary | ICD-10-CM

## 2013-04-07 MED ORDER — SERTRALINE HCL 25 MG PO TABS: 25.0000 mg | ORAL_TABLET | Freq: Every morning | ORAL | Status: AC

## 2013-04-07 MED ORDER — HYDROCODONE-ACETAMINOPHEN 5-325 MG PO TABS: 1.0000 | ORAL_TABLET | Freq: Four times a day (QID) | ORAL | Status: DC | PRN

## 2013-04-07 NOTE — Progress Notes (Signed)
Patient ID: Paylin Hailu, female   DOB: [birth year], 79 y.o.   MRN: [id number]  Subjective:   Patient ID: Ermalee Mealy female   DOB: 1925-01-06 79 y.o.   MRN: [id number]  HPI: Ms.Freda Breon Diss is a 79 y.o. F w/ PMH dementia, CKD stage 3, gluten intolerance, and  protein malnutrition who presents for   She was seen in the ED on 11/27 after a fall from standing and was c/o of left shoulder pain. She denied LOC. Imaging did not reveal any fractures. She was d/c home. Per pt and family members she has not had any further falls.   Her daughters are with her today, and they state that her mother has 2 skin tears one on her left shin and the other is on her right forearm. The patient denies any significant pain. She's not having any fevers, or purulent discharge from the site.  She has home health services and would like to renew them.   Past Medical History  Diagnosis Date  . MGUS (monoclonal gammopathy of unknown significance)      [facility]  . Anxiety   . Depression   . Diverticulosis of colon   . Osteopenia   . Lumbar spinal stenosis   . Atrophic vaginitis   . History of tobacco abuse     at least 64 pack year. Started about age 58 and continued until about age 53.  Marland Kitchen Dermatitis   . Hip pain      History  . Actinic keratosis   . Dementia   . Adrenal suppression     central, on chronic steroids, followed by Dr. Sharl Ma of endocrinology  . Gluten intolerance     August 2011 endoscopy with biopsy negative for celiac disease. Prior to this patient had elevated TTG IgG of 93 (normal <20). Dr Loreta Ave made clinical diagnosis of gluten intolerance based on diarrhea/symptoms from eating gluten. Needs GF diet.   Daughter tries to control diet.  . Colon cancer     History of bowel  obstruction. on treatment with Colace and MiraLax.  followed by Dr. Loreta Ave and Dr. Purnell Shoemaker  . Shortness of breath     "sometimes; can happen at any time"  (10/14/2012)  . Arthritis     "probably" (10/14/2012)  . Renal cyst   . CKD (chronic kidney disease), stage II     [name] 10/14/2012  . Stress incontinence in female   . Subcapital fracture of left hip 11/13/2012    Dr Dion Saucier   Current Outpatient Prescriptions  Medication Sig Dispense Refill  . acetaminophen (TYLENOL) 325 MG tablet Take 325 mg by mouth daily as needed. For pain      . aspirin 81 MG tablet Take 81 mg by mouth daily.       . calcium citrate-vitamin D (CITRACAL+D) 315-200 MG-UNIT per tablet Take 1 tablet by mouth 2 (two) times daily.  60 tablet  11  . cyanocobalamin (,VITAMIN B-12,) 1000 MCG/ML injection Inject 1 mL (1,000 mcg total) into the muscle every 30 (thirty) days.  1 mL  11  . feeding supplement (ENSURE COMPLETE) LIQD Take 237 mLs by mouth daily. Vanilla.      . furosemide (LASIX) 20 MG tablet Take 1 tablet (20 mg total) by mouth daily.  30 tablet  0  . HYDROcodone-acetaminophen (NORCO) 5-325 MG per tablet Take 1-2 tablets by mouth every 6 (six) hours as needed for pain. MAXIMUM TOTAL ACETAMINOPHEN DOSE IS  4000 MG PER DAY  20 tablet  0  . hydrocortisone (CORTEF) 10 MG tablet Take 5-10 mg by mouth 2 (two) times daily. 10 mg in the a.m. And 5 mg in the afternoon.      . nystatin (MYCOSTATIN/NYSTOP) 100000 UNIT/GM POWD Apply to affected skin as needed for yeast infection in skin folds.  1 Bottle  0  . ondansetron (ZOFRAN ODT) 8 MG disintegrating tablet Take 1 tablet (8 mg total) by mouth every 8 (eight) hours as needed for nausea.  10 tablet  0  . paricalcitol (ZEMPLAR) 1 MCG capsule Take 1 mcg by mouth every Monday, Wednesday, and Friday.      . potassium chloride (K-DUR) 10 MEQ tablet Take 2 tablets (20 mEq total) by mouth 2 (two) times daily.  60 tablet  3  . sertraline (ZOLOFT) 50 MG tablet TAKE 1 TABLET BY MOUTH EVERY DAY  30 tablet  6  . simethicone (MYLICON) 80 MG chewable tablet Chew 160 mg by mouth daily as needed. For flatulence       No current  facility-administered medications for this visit.   No family history on file. History   Social History  . Marital Status: Widowed    Spouse Name: N/A    Number of Children: N/A  . Years of Education: N/A   Social History Main Topics  . Smoking status: Former Smoker -- 1.00 packs/day for 47 years    Types: Cigarettes    Quit date: 08/01/1988  . Smokeless tobacco: Never Used  . Alcohol Use: No  . Drug Use: No  . Sexual Activity: None   Other Topics Concern  . None   Social History Narrative    Husband died of myeloma.  Patient lives with daughter and son in Social worker, with [facility] services.   Review of Systems: A 12 point ROS was performed; pertinent positives and negatives were noted in the HPI   Objective:  Physical Exam: Filed Vitals:   04/07/13 1446  BP: 104/80  Pulse: 87  Temp: 96.9 F (36.1 C)  TempSrc: Oral  Weight: 122 lb 11.2 oz (55.656 kg)   Constitutional: Vital signs reviewed.  Patient is a cachectic female in no acute distress who is very pleasant and cooperative with exam.  Head: Normocephalic and atraumatic Eyes: PERRL, EOMI, conjunctivae normal, No scleral icterus.  Cardiovascular: RRR, no MRG, pulses symmetric and intact bilaterally Pulmonary/Chest: normal respiratory effort, CTAB, no wheezes, rales, or rhonchi Abdominal: Soft. Non-tender, non-distended, bowel sounds are hyperactive Musculoskeletal: No joint deformities, erythema. Moves all 4 ext. Neurological: A&O x3. Non focal. Skin: Skin is cool and dry. Skin tear on left anterior shin and right forearm with vaseline gauze covering the site, which appear clean, without discharge.  Psychiatric: Normal mood and affect. speech and behavior is normal.    Assessment & Plan:   Please refer to Problem List based Assessment and Plan

## 2013-04-08 NOTE — Assessment & Plan Note (Signed)
Patient was in tears on her left anterior shin and right forearm. They all appear clean. The patient's daughter and have the nurse are taking good care of them. Will continue home nursing to assist the family with care of these wounds. Family is to call the clinic if they begin to have a solid or, or purulent discharge.

## 2013-04-08 NOTE — Assessment & Plan Note (Signed)
Pt on Zoloft 50mg  at night. Previously in the hospital, she was placed on 25mg  in the morning and continued on the 50mg  at night. Her daughters state that the extra dose in the morning really seem to help with her mother's mood without over sedating her. If requested that we increase her home dose to 25 mg in the morning and continue 50 mg at night, which I have agreed to do. - Zoloft 25 mg every morning and 50 mg each bedtime

## 2013-04-08 NOTE — Addendum Note (Signed)
Addended by: Charlsie Merles F on: 04/08/2013 11:43 AM   Modules accepted: Level of Service

## 2013-04-08 NOTE — Progress Notes (Signed)
Case discussed with Dr. Glenn soon after the resident saw the patient.  We reviewed the resident's history and exam and pertinent patient test results.  I agree with the assessment, diagnosis and plan of care documented in the resident's note. 

## 2013-04-08 NOTE — Assessment & Plan Note (Signed)
The patient is no longer using digital supplements, because she was only drinking a couple sips of them per her daughters. Her weight is stable today and remains up about 10 pounds from July. We will continue to monitor her weight, but for now she seems be doing okay maintaining her weight.

## 2013-04-15 ENCOUNTER — Encounter: Payer: Self-pay | Admitting: Licensed Clinical Social Worker

## 2013-04-15 NOTE — Progress Notes (Signed)
Confirmed with [facility] services [phone], pt is current with [facility] RN and [facility].  CSW informed [name] a new order for same services, order faxed to [place] [phone].  Pt will continue with services.

## 2013-04-27 ENCOUNTER — Other Ambulatory Visit (HOSPITAL_BASED_OUTPATIENT_CLINIC_OR_DEPARTMENT_OTHER): Payer: Medicare Other

## 2013-04-27 DIAGNOSIS — D472 Monoclonal gammopathy: Secondary | ICD-10-CM

## 2013-04-27 LAB — COMPREHENSIVE METABOLIC PANEL (CC13)
ALT: 12 U/L (ref 0–55)
Albumin: 2.4 g/dL — ABNORMAL LOW (ref 3.5–5.0)
Anion Gap: 16 mEq/L — ABNORMAL HIGH (ref 3–11)
BUN: 24.5 mg/dL (ref 7.0–26.0)
CO2: 16 mEq/L — ABNORMAL LOW (ref 22–29)
Calcium: 8.5 mg/dL (ref 8.4–10.4)
Chloride: 111 mEq/L — ABNORMAL HIGH (ref 98–109)
Glucose: 111 mg/dl (ref 70–140)
Potassium: 4 mEq/L (ref 3.5–5.1)
Sodium: 144 mEq/L (ref 136–145)
Total Bilirubin: 0.26 mg/dL (ref 0.20–1.20)
Total Protein: 6.6 g/dL (ref 6.4–8.3)

## 2013-04-27 LAB — CBC WITH DIFFERENTIAL/PLATELET
BASO%: 0.4 % (ref 0.0–2.0)
Basophils Absolute: 0 10*3/uL (ref 0.0–0.1)
HGB: 12.8 g/dL (ref 11.6–15.9)
LYMPH%: 20.5 % (ref 14.0–49.7)
MCHC: 32.8 g/dL (ref 31.5–36.0)
MCV: 90.3 fL (ref 79.5–101.0)
MONO#: 0.5 10*3/uL (ref 0.1–0.9)
MONO%: 6.3 % (ref 0.0–14.0)
NEUT#: 5.7 10*3/uL (ref 1.5–6.5)
RBC: 4.33 10*6/uL (ref 3.70–5.45)
RDW: 16.6 % — ABNORMAL HIGH (ref 11.2–14.5)
WBC: 8 10*3/uL (ref 3.9–10.3)
lymph#: 1.6 10*3/uL (ref 0.9–3.3)

## 2013-04-30 LAB — SPEP & IFE WITH QIG
Albumin ELP: 43.3 % — ABNORMAL LOW (ref 55.8–66.1)
Alpha-1-Globulin: 7.1 % — ABNORMAL HIGH (ref 2.9–4.9)
Alpha-2-Globulin: 15 % — ABNORMAL HIGH (ref 7.1–11.8)
Beta 2: 8.3 % — ABNORMAL HIGH (ref 3.2–6.5)
Beta Globulin: 7.1 % (ref 4.7–7.2)
Gamma Globulin: 19.2 % — ABNORMAL HIGH (ref 11.1–18.8)
IgA: 556 mg/dL — ABNORMAL HIGH (ref 69–380)
IgG (Immunoglobin G), Serum: 1050 mg/dL (ref 690–1700)
IgM, Serum: 97 mg/dL (ref 52–322)
Total Protein, Serum Electrophoresis: 6.3 g/dL (ref 6.0–8.3)

## 2013-04-30 LAB — KAPPA/LAMBDA LIGHT CHAINS
Kappa free light chain: 7.53 mg/dL — ABNORMAL HIGH (ref 0.33–1.94)
Kappa:Lambda Ratio: 1.53 (ref 0.26–1.65)
Lambda Free Lght Chn: 4.92 mg/dL — ABNORMAL HIGH (ref 0.57–2.63)

## 2013-05-04 ENCOUNTER — Telehealth: Payer: Self-pay | Admitting: Internal Medicine

## 2013-05-04 ENCOUNTER — Ambulatory Visit: Payer: Medicare Other

## 2013-05-04 NOTE — Telephone Encounter (Signed)
pt called to r/s appt due to fever....done

## 2013-05-07 ENCOUNTER — Ambulatory Visit: Payer: Medicare Other

## 2013-05-07 ENCOUNTER — Telehealth: Payer: Self-pay | Admitting: *Deleted

## 2013-05-07 ENCOUNTER — Other Ambulatory Visit: Payer: Self-pay | Admitting: *Deleted

## 2013-05-07 DIAGNOSIS — C189 Malignant neoplasm of colon, unspecified: Secondary | ICD-10-CM

## 2013-05-07 NOTE — Telephone Encounter (Signed)
Pt missed follow up appt today with Dr. Juliann Mule.  Called pt at home and spoke with daughter [place].  Per [place],  Pt is still sick and sleeping at present.  Karena Addison stated she forgot appt completely today.  [name] wished to reschedule appt with Dr. Juliann Mule.  Informed Karena Addison that a scheduler will call with a new appt .  [name] voiced understanding.

## 2013-05-10 ENCOUNTER — Telehealth: Payer: Self-pay | Admitting: Internal Medicine

## 2013-05-10 NOTE — Telephone Encounter (Signed)
s/w dtr dee re appt for lb/fu 1/22 @ 10am.

## 2013-05-20 ENCOUNTER — Other Ambulatory Visit (HOSPITAL_BASED_OUTPATIENT_CLINIC_OR_DEPARTMENT_OTHER): Payer: Medicare Other

## 2013-05-20 ENCOUNTER — Ambulatory Visit (HOSPITAL_BASED_OUTPATIENT_CLINIC_OR_DEPARTMENT_OTHER): Payer: Medicare Other | Admitting: Internal Medicine

## 2013-05-20 VITALS — BP 93/59 | HR 76 | Resp 17 | Ht 67.0 in | Wt 121.8 lb

## 2013-05-20 DIAGNOSIS — N183 Chronic kidney disease, stage 3 unspecified: Secondary | ICD-10-CM

## 2013-05-20 DIAGNOSIS — D472 Monoclonal gammopathy: Secondary | ICD-10-CM

## 2013-05-20 DIAGNOSIS — C189 Malignant neoplasm of colon, unspecified: Secondary | ICD-10-CM

## 2013-05-20 LAB — CBC WITH DIFFERENTIAL/PLATELET
BASO%: 0.3 % (ref 0.0–2.0)
BASOS ABS: 0 10*3/uL (ref 0.0–0.1)
EOS%: 1.8 % (ref 0.0–7.0)
Eosinophils Absolute: 0.1 10*3/uL (ref 0.0–0.5)
HCT: 36.7 % (ref 34.8–46.6)
HGB: 11.6 g/dL (ref 11.6–15.9)
LYMPH#: 1.8 10*3/uL (ref 0.9–3.3)
LYMPH%: 29.9 % (ref 14.0–49.7)
MCH: 28.9 pg (ref 25.1–34.0)
MCHC: 31.6 g/dL (ref 31.5–36.0)
MCV: 91.5 fL (ref 79.5–101.0)
MONO#: 0.5 10*3/uL (ref 0.1–0.9)
MONO%: 7.4 % (ref 0.0–14.0)
NEUT#: 3.7 10*3/uL (ref 1.5–6.5)
NEUT%: 60.6 % (ref 38.4–76.8)
PLATELETS: 227 10*3/uL (ref 145–400)
RBC: 4.01 10*6/uL (ref 3.70–5.45)
RDW: 15.8 % — ABNORMAL HIGH (ref 11.2–14.5)
WBC: 6.1 10*3/uL (ref 3.9–10.3)

## 2013-05-20 LAB — COMPREHENSIVE METABOLIC PANEL (CC13)
ALK PHOS: 114 U/L (ref 40–150)
ALT: 9 U/L (ref 0–55)
AST: 12 U/L (ref 5–34)
Albumin: 2.3 g/dL — ABNORMAL LOW (ref 3.5–5.0)
Anion Gap: 11 mEq/L (ref 3–11)
BUN: 26.1 mg/dL — AB (ref 7.0–26.0)
CALCIUM: 8.2 mg/dL — AB (ref 8.4–10.4)
CHLORIDE: 117 meq/L — AB (ref 98–109)
CO2: 18 mEq/L — ABNORMAL LOW (ref 22–29)
CREATININE: 1.3 mg/dL — AB (ref 0.6–1.1)
Glucose: 94 mg/dl (ref 70–140)
Potassium: 3.7 mEq/L (ref 3.5–5.1)
Sodium: 146 mEq/L — ABNORMAL HIGH (ref 136–145)
Total Bilirubin: 0.21 mg/dL (ref 0.20–1.20)
Total Protein: 6 g/dL — ABNORMAL LOW (ref 6.4–8.3)

## 2013-05-20 MED ORDER — HYDROCODONE-ACETAMINOPHEN 5-325 MG PO TABS: 1.0000 | ORAL_TABLET | Freq: Four times a day (QID) | ORAL | Status: DC | PRN

## 2013-05-21 ENCOUNTER — Telehealth: Payer: Self-pay | Admitting: Internal Medicine

## 2013-05-21 NOTE — Progress Notes (Signed)
[place] OFFICE PROGRESS NOTE  Otho Bellows, MD [address] [place] [address]  DIAGNOSIS: MGUS (monoclonal gammopathy of unknown significance) - Plan: CBC with Differential, Comprehensive metabolic panel (Cmet) - [facility], Lactate dehydrogenase (LDH) - [facility], SPEP & IFE with QIG, Kappa/lambda light chains, DG Bone Survey Met  CKD (chronic kidney disease) stage 3, GFR 30-59 ml/min  Chief Complaint  Patient presents with  . Unspecified deficiency anemia    CURRENT THERAPY: Observation and Vitamin B-12 every 30 days  INTERVAL HISTORY: Rachel Bridges 80 y.o. female  with a monoclonal gammopathy of undetermined significance, who presents for  followup. She was last seen by Dr. Jamse Arn on 02/25/2012.  She is here with her son-in-law.  She reports no current concerns. She notes overall decreasing energy level, but is able to perform all activities of daily living. Denies bone pain. Has had no loss in weight. She had a fall and was evaluated in the ED on 11/27 with mild injury to her shoulder.  She broke her L hip in July 2014 and was hospitalized for a few months.  Following rehab, she is now walking.  She reports some intermittent pain to her hip with prolonged walking.    MEDICAL HISTORY: Past Medical History  Diagnosis Date  . MGUS (monoclonal gammopathy of unknown significance)      Followed by Dr. Jamse Arn at [facility]  . Anxiety   . Depression   . Diverticulosis of colon   . Osteopenia   . Lumbar spinal stenosis   . Atrophic vaginitis   . History of tobacco abuse     at least 64 pack year. Started about age 89 and continued until about age 56.  Marland Kitchen Dermatitis   . Hip pain      History  . Actinic keratosis   . Dementia   . Adrenal suppression     central, on chronic steroids, followed by Dr. Buddy Duty of endocrinology  . Gluten intolerance     August 2011 endoscopy with biopsy negative for celiac disease. Prior to this patient had elevated  TTG IgG of 93 (normal <20). Dr Collene Mares made clinical diagnosis of gluten intolerance based on diarrhea/symptoms from eating gluten. Needs GF diet.   Daughter tries to control diet.  . Colon cancer     History of bowel  obstruction. on treatment with Colace and MiraLax.  followed by Dr. Collene Mares and Dr. Barkley Bruns  . Shortness of breath     "sometimes; can happen at any time" (10/14/2012)  . Arthritis     "probably" (10/14/2012)  . Renal cyst   . CKD (chronic kidney disease), stage II     Archie Endo 10/14/2012  . Stress incontinence in female   . Subcapital fracture of left hip 11/13/2012    Dr Mardelle Matte    INTERIM HISTORY: has VITAMIN B12 DEFICIENCY; MONOCLONAL GAMMOPATHY; DEPRESSION; GERD; DERMATITIS, SEBORRHEIC; PSORIASIS; Adult failure to thrive; CKD (chronic kidney disease) stage 3, GFR 30-59 ml/min; Adrenal insufficiency; Peripheral edema; Gluten intolerance; Insomnia; Severe malnutrition; Abnormality of gait; Hypokalemia; Chronic diarrhea; Dementia; Fracture of femoral neck, left; Protein-calorie malnutrition, severe; Subcapital fracture of left hip; Stress incontinence in female; Renal cyst; Arthritis; Colon cancer; Lumbar spinal stenosis; MGUS (monoclonal gammopathy of unknown significance); Anxiety; Postoperative anemia due to acute blood loss; Blister of right foot; and Multiple skin tears on her problem list.    ALLERGIES:  is allergic to oxycodone hcl; ambien; morphine and related; and pantoprazole.  MEDICATIONS: has a current  medication list which includes the following prescription(s): acetaminophen, aspirin, calcium citrate-vitamin d, cyanocobalamin, feeding supplement (ensure complete), furosemide, hydrocodone-acetaminophen, hydrocortisone, nystatin, ondansetron, paricalcitol, potassium chloride, sertraline, and simethicone.  SURGICAL HISTORY:  Past Surgical History  Procedure Laterality Date  . Hemicolectomy       Right-sided  . Ovarian cyst removal    . Cataract extraction w/ intraocular  lens  implant, bilateral Bilateral   . Knee arthroscopy Left   . Appendectomy    . Carpal tunnel release Left   . Lumbar laminectomy    . Lumbar fusion    . Replacement total knee Left   . Tonsillectomy  1930's  . Hip arthroplasty Left 11/13/2012    Procedure: ARTHROPLASTY BIPOLAR HIP;  Surgeon: Johnny Bridge, MD;  Location: [facility];  Service: Orthopedics;  Laterality: Left;    REVIEW OF SYSTEMS:   Constitutional: Denies fevers, chills or abnormal weight loss Eyes: Denies blurriness of vision Ears, nose, mouth, throat, and face: Denies mucositis or sore throat Respiratory: Denies cough, dyspnea or wheezes Cardiovascular: Denies palpitation, chest discomfort or lower extremity swelling Gastrointestinal:  Denies nausea, heartburn or change in bowel habits Skin: Denies abnormal skin rashes Lymphatics: Denies new lymphadenopathy or easy bruising Neurological:Denies numbness, tingling or new weaknesses Behavioral/Psych: Mood is stable, no new changes  All other systems were reviewed with the patient and are negative.  PHYSICAL EXAMINATION: ECOG PERFORMANCE STATUS: 1 - Symptomatic but completely ambulatory  Blood pressure 93/59, pulse 76, resp. rate 17, height _0  (1.702 m), weight 121 lb 12.8 oz (55.248 kg).  GENERAL:alert, no distress and comfortable; elderly female who appears her stated age.  SKIN: skin color, texture, turgor are normal, no rashes or significant lesions EYES: normal, Conjunctiva are pink and non-injected, sclera clear OROPHARYNX:no exudate, no erythema and lips, buccal mucosa, and tongue normal  NECK: supple, thyroid normal size, non-tender, without nodularity LYMPH:  no palpable lymphadenopathy in the cervical, axillary or supraclavicular LUNGS: clear to auscultation and percussion with normal breathing effort HEART: regular rate & rhythm and no murmurs and 1+ lower extremity edema with some chronic venous changes ABDOMEN:abdomen soft, non-tender and normal  bowel sounds Musculoskeletal:no cyanosis of digits and no clubbing  NEURO: alert & oriented x 3 with fluent speech, no focal motor/sensory deficits   LABORATORY DATA: Results for orders placed in visit on 05/20/13 (from the past 48 hour(s))  CBC WITH DIFFERENTIAL     Status: Abnormal   Collection Time    05/20/13  9:50 AM      Result Value Range   WBC 6.1  3.9 - 10.3 10e3/uL   NEUT# 3.7  1.5 - 6.5 10e3/uL   HGB 11.6  11.6 - 15.9 g/dL   HCT 36.7  34.8 - 46.6 %   Platelets 227  145 - 400 10e3/uL   MCV 91.5  79.5 - 101.0 fL   MCH 28.9  25.1 - 34.0 pg   MCHC 31.6  31.5 - 36.0 g/dL   RBC 4.01  3.70 - 5.45 10e6/uL   RDW 15.8 (*) 11.2 - 14.5 %   lymph# 1.8  0.9 - 3.3 10e3/uL   MONO# 0.5  0.1 - 0.9 10e3/uL   Eosinophils Absolute 0.1  0.0 - 0.5 10e3/uL   Basophils Absolute 0.0  0.0 - 0.1 10e3/uL   NEUT% 60.6  38.4 - 76.8 %   LYMPH% 29.9  14.0 - 49.7 %   MONO% 7.4  0.0 - 14.0 %   EOS% 1.8  0.0 - 7.0 %  BASO% 0.3  0.0 - 2.0 %  COMPREHENSIVE METABOLIC PANEL (ZO10)     Status: Abnormal   Collection Time    05/20/13  9:50 AM      Result Value Range   Sodium 146 (*) 136 - 145 mEq/L   Potassium 3.7  3.5 - 5.1 mEq/L   Chloride 117 (*) 98 - 109 mEq/L   CO2 18 (*) 22 - 29 mEq/L   Glucose 94  70 - 140 mg/dl   BUN 26.1 (*) 7.0 - 26.0 mg/dL   Creatinine 1.3 (*) 0.6 - 1.1 mg/dL   Total Bilirubin 0.21  0.20 - 1.20 mg/dL   Alkaline Phosphatase 114  40 - 150 U/L   AST 12  5 - 34 U/L   ALT 9  0 - 55 U/L   Total Protein 6.0 (*) 6.4 - 8.3 g/dL   Albumin 2.3 (*) 3.5 - 5.0 g/dL   Calcium 8.2 (*) 8.4 - 10.4 mg/dL   Anion Gap 11  3 - 11 mEq/L    Labs:  Lab Results  Component Value Date   WBC 6.1 05/20/2013   HGB 11.6 05/20/2013   HCT 36.7 05/20/2013   MCV 91.5 05/20/2013   PLT 227 05/20/2013   NEUTROABS 3.7 05/20/2013      Chemistry      Component Value Date/Time   NA 146* 05/20/2013 0950   NA 138 03/25/2013 2150   K 3.7 05/20/2013 0950   K 3.8 03/25/2013 2150   CL 106 03/25/2013 2150    CO2 18* 05/20/2013 0950   CO2 23 03/25/2013 2150   BUN 26.1* 05/20/2013 0950   BUN 22 03/25/2013 2150   CREATININE 1.3* 05/20/2013 0950   CREATININE 1.38* 03/25/2013 2150   CREATININE 1.37* 03/09/2013 1520      Component Value Date/Time   CALCIUM 8.2* 05/20/2013 0950   CALCIUM 8.2* 03/25/2013 2150   ALKPHOS 114 05/20/2013 0950   ALKPHOS 88 12/25/2012 1553   AST 12 05/20/2013 0950   AST 10 12/25/2012 1553   ALT 9 05/20/2013 0950   ALT 8 12/25/2012 1553   BILITOT 0.21 05/20/2013 0950   BILITOT 0.2* 12/25/2012 1553     Basic Metabolic Panel:  Recent Labs Lab 05/20/13 0950  NA 146*  K 3.7  CO2 18*  GLUCOSE 94  BUN 26.1*  CREATININE 1.3*  CALCIUM 8.2*   GFR Estimated Creatinine Clearance: 27.1 ml/min (by C-G formula based on Cr of 1.3). Liver Function Tests:  Recent Labs Lab 05/20/13 0950  AST 12  ALT 9  ALKPHOS 114  BILITOT 0.21  PROT 6.0*  ALBUMIN 2.3*   CBC:  Recent Labs Lab 05/20/13 0950  WBC 6.1  NEUTROABS 3.7  HGB 11.6  HCT 36.7  MCV 91.5  PLT 227   Results for POSIE, LILLIBRIDGE (MRN [id number]) as of 05/21/2013 05:46  Ref. Range 04/27/2013 14:22  Albumin ELP Latest Range: 55.8-66.1 % 43.3 (L)  COMMENT (PROTEIN ELECTROPHOR) No range found *  Alpha-1-Globulin Latest Range: 2.9-4.9 % 7.1 (H)  Alpha-2-Globulin Latest Range: 7.1-11.8 % 15.0 (H)  Beta Globulin Latest Range: 4.7-7.2 % 7.1  Beta 2 Latest Range: 3.2-6.5 % 8.3 (H)  Gamma Globulin Latest Range: 11.1-18.8 % 19.2 (H)  M-SPIKE, % No range found NOT DET  SPE Interp. No range found *  IgG (Immunoglobin G), Serum Latest Range: [phone] mg/dL 1050  IgA Latest Range: 69-380 mg/dL 556 (H)  IgM, Serum Latest Range: 52-322 mg/dL 97  Total Protein, Serum Electrophoresis Latest  Range: 6.0-8.3 g/dL 6.3  Kappa free light chain Latest Range: 0.33-1.94 mg/dL 7.53 (H)  Lambda Free Lght Chn Latest Range: 0.57-2.63 mg/dL 4.92 (H)  Kappa:Lambda Ratio Latest Range: 0.26-1.65  1.53   Studies:  No results  found.   RADIOGRAPHIC STUDIES: No results found.  ASSESSMENT: Any Mcneice 80 y.o. female with a history of MGUS (monoclonal gammopathy of unknown significance) - Plan: CBC with Differential, Comprehensive metabolic panel (Cmet) - [facility], Lactate dehydrogenase (LDH) - [facility], SPEP & IFE with QIG, Kappa/lambda light chains, DG Bone Survey Met  CKD (chronic kidney disease) stage 3, GFR 30-59 ml/min   PLAN:   1. MGUS. --Mrs. Greer is an 80 year old woman with IgA  monoclonal gammopathy of undetermined significance. We will repeat her skeletal survey but the one in 2013 was stable when compared with the results from a year ago. We will repeat her SPEP, Kappa/lambda light chains in one year.   2. S/p L hip fracture (10/2012) --She continues to recover but reports some pain with ambulation.  I provided her 30 tablet of Norco 5/325 mg q 6 hours as needed to maximize her physical therapy.     3. CKDz.  Her creatinine is 1.3. She continues to report decreased hydration. I have also encouraged increased hydration.  She is being followed by Dr. Lyda Kalata.    4. Follow-up.  --From my standpoint, she follows up in a year's time with blood work and to include myeloma markers.   All questions were answered. The patient knows to call the clinic with any problems, questions or concerns. We can certainly see the patient much sooner if necessary.  I spent 15 minutes counseling the patient face to face. The total time spent in the appointment was 25 minutes.    Gabrial Domine, MD 05/21/2013 5:32 AM

## 2013-05-21 NOTE — Telephone Encounter (Signed)
s.w. pt daughter and advised on Jan 2016 appt....mailed pt appt sched , avs and letter

## 2013-05-28 ENCOUNTER — Ambulatory Visit (HOSPITAL_COMMUNITY): Payer: Medicare Other

## 2013-06-03 ENCOUNTER — Other Ambulatory Visit: Payer: Self-pay | Admitting: Internal Medicine

## 2013-06-03 ENCOUNTER — Encounter: Payer: Self-pay | Admitting: Internal Medicine

## 2013-06-03 ENCOUNTER — Encounter: Payer: Medicare Other | Admitting: Internal Medicine

## 2013-06-03 ENCOUNTER — Ambulatory Visit (INDEPENDENT_AMBULATORY_CARE_PROVIDER_SITE_OTHER): Payer: Medicare Other | Admitting: Internal Medicine

## 2013-06-03 VITALS — BP 97/68 | HR 79 | Temp 98.2°F | Wt 124.7 lb

## 2013-06-03 DIAGNOSIS — N183 Chronic kidney disease, stage 3 unspecified: Secondary | ICD-10-CM

## 2013-06-03 DIAGNOSIS — E2749 Other adrenocortical insufficiency: Secondary | ICD-10-CM

## 2013-06-03 DIAGNOSIS — E2740 Unspecified adrenocortical insufficiency: Secondary | ICD-10-CM

## 2013-06-03 DIAGNOSIS — R197 Diarrhea, unspecified: Secondary | ICD-10-CM

## 2013-06-03 DIAGNOSIS — S72002A Fracture of unspecified part of neck of left femur, initial encounter for closed fracture: Secondary | ICD-10-CM

## 2013-06-03 DIAGNOSIS — G8929 Other chronic pain: Secondary | ICD-10-CM

## 2013-06-03 DIAGNOSIS — M25559 Pain in unspecified hip: Secondary | ICD-10-CM

## 2013-06-03 DIAGNOSIS — S72009A Fracture of unspecified part of neck of unspecified femur, initial encounter for closed fracture: Secondary | ICD-10-CM

## 2013-06-03 LAB — BASIC METABOLIC PANEL WITH GFR
BUN: 22 mg/dL (ref 6–23)
CO2: 22 meq/L (ref 19–32)
Calcium: 7.8 mg/dL — ABNORMAL LOW (ref 8.4–10.5)
Chloride: 113 mEq/L — ABNORMAL HIGH (ref 96–112)
Creat: 1.39 mg/dL — ABNORMAL HIGH (ref 0.50–1.10)
GFR, EST AFRICAN AMERICAN: 40 mL/min — AB
GFR, Est Non African American: 34 mL/min — ABNORMAL LOW
Glucose, Bld: 94 mg/dL (ref 70–99)
POTASSIUM: 3.4 meq/L — AB (ref 3.5–5.3)
SODIUM: 145 meq/L (ref 135–145)

## 2013-06-03 LAB — CBC
HCT: 34.8 % — ABNORMAL LOW (ref 36.0–46.0)
Hemoglobin: 11.5 g/dL — ABNORMAL LOW (ref 12.0–15.0)
MCH: 29.3 pg (ref 26.0–34.0)
MCHC: 33 g/dL (ref 30.0–36.0)
MCV: 88.8 fL (ref 78.0–100.0)
PLATELETS: 232 10*3/uL (ref 150–400)
RBC: 3.92 MIL/uL (ref 3.87–5.11)
RDW: 15.4 % (ref 11.5–15.5)
WBC: 6.6 10*3/uL (ref 4.0–10.5)

## 2013-06-03 MED ORDER — SERTRALINE HCL 50 MG PO TABS: 50.0000 mg | ORAL_TABLET | Freq: Every day | ORAL | Status: DC

## 2013-06-03 MED ORDER — ONDANSETRON 8 MG PO TBDP: 8.0000 mg | ORAL_TABLET | Freq: Three times a day (TID) | ORAL | Status: DC | PRN

## 2013-06-03 MED ORDER — DIPHENOXYLATE-ATROPINE 2.5-0.025 MG PO TABS: 1.0000 | ORAL_TABLET | Freq: Three times a day (TID) | ORAL | Status: DC | PRN

## 2013-06-03 MED ORDER — PARICALCITOL 1 MCG PO CAPS: 1.0000 ug | ORAL_CAPSULE | ORAL | Status: DC

## 2013-06-03 MED ORDER — HYDROCODONE-ACETAMINOPHEN 5-325 MG PO TABS: 1.0000 | ORAL_TABLET | Freq: Every evening | ORAL | Status: DC | PRN

## 2013-06-03 NOTE — Assessment & Plan Note (Signed)
I refilled patient's Norco 5-325 mg qHS #30 to help with sleep given patient has residual left hip pain.

## 2013-06-03 NOTE — Progress Notes (Signed)
Patient ID: Rachel Bridges, female   DOB: 10-16-1924, 80 y.o.   MRN: [id number] HPI The patient is a 80 y.o. female with a history of dementia, CKD stage 3, gluten intolerance, recurrent SBO, GERD, adrenal insufficiency, and protein malnutrition who presents w/ daughter for an acute visit.  Per daughter, patient has had approximately 8 episodes of watery diarrhea daily for the past 4-5 days. Patient complains of having some nausea, no vomiting. Patient has been able to drink plenty of water and orange juice at home, however daughter reports patient has a decreased appetite. Patient/daughter denies having any abdominal pain, fevers, chills, dysuria, hematuria. Daughter reports that patient's urine has smelled "salty" over this time period. Patient has not had antibiotics for the past year and has not been hospitalized since July 2014 when she fractured her L hip.  They are requesting a refill of Norco that was given to them by patient's oncologist, Dr. Juliann Mule. Patient takes Norco only at night to help her sleep given she has chronic residual pain in her left hip from her prior fracture. Daughter also requesting refills of patient's Zoloft 50 mg tablets and she takes at night.  ROS: General: no fevers, chills, changes in weight, changes in appetite Skin: no rash HEENT: vision changes, sore throat Pulm: no dyspnea, coughing, wheezing CV: no chest pain, shortness of breath Abd: see HPI GU: no dysuria, hematuria, polyuria; +"salty" smell to urine Ext: no arthralgias, myalgias Neuro: no weakness, numbness, or tingling  Filed Vitals:   06/03/13 1600  BP: 97/68  Pulse: 79   Physical Exam General: frail, elderly woman; alert, cooperative, and in no apparent distress HEENT: pupils equal round and reactive to light, vision grossly intact, oropharynx clear and non-erythematous; mucous membranes slightly dry  Neck: supple Lungs: clear to ascultation bilaterally, normal work of respiration, no  wheezes, rales, ronchi Heart: regular rate and rhythm, no murmurs, gallops, or rubs Abdomen: soft, non-tender, non-distended, bowel sounds slightly hyperactive Extremities: trace pitting edema to bilateral lower legs; BLE are very thin and have multiple contusions Neurologic: alert & oriented X2, cranial nerves II-XII grossly intact, strength grossly intact, sensation intact to light touch  Current Outpatient Prescriptions on File Prior to Visit  Medication Sig Dispense Refill  . acetaminophen (TYLENOL) 325 MG tablet Take 325 mg by mouth daily as needed. For pain      . aspirin 81 MG tablet Take 81 mg by mouth daily.       . calcium citrate-vitamin D (CITRACAL+D) 315-200 MG-UNIT per tablet Take 1 tablet by mouth 2 (two) times daily.  60 tablet  11  . cyanocobalamin (,VITAMIN B-12,) 1000 MCG/ML injection Inject 1 mL (1,000 mcg total) into the muscle every 30 (thirty) days.  1 mL  11  . feeding supplement (ENSURE COMPLETE) LIQD Take 237 mLs by mouth daily. Vanilla.      . furosemide (LASIX) 20 MG tablet Take 1 tablet (20 mg total) by mouth daily.  30 tablet  0  . HYDROcodone-acetaminophen (NORCO) 5-325 MG per tablet Take 1-2 tablets by mouth every 6 (six) hours as needed for moderate pain. MAXIMUM TOTAL ACETAMINOPHEN DOSE IS 4000 MG PER DAY  30 tablet  0  . hydrocortisone (CORTEF) 10 MG tablet Take 5-10 mg by mouth 2 (two) times daily. 10 mg in the a.m. And 5 mg in the afternoon.      . nystatin (MYCOSTATIN/NYSTOP) 100000 UNIT/GM POWD Apply to affected skin as needed for yeast infection in skin folds.  1 Bottle  0  . ondansetron (ZOFRAN ODT) 8 MG disintegrating tablet Take 1 tablet (8 mg total) by mouth every 8 (eight) hours as needed for nausea.  10 tablet  0  . paricalcitol (ZEMPLAR) 1 MCG capsule Take 1 mcg by mouth every Monday, Wednesday, and Friday.      . potassium chloride (K-DUR) 10 MEQ tablet Take 2 tablets (20 mEq total) by mouth 2 (two) times daily.  60 tablet  3  . sertraline (ZOLOFT)  25 MG tablet Take 1 tablet (25 mg total) by mouth every morning.  30 tablet  6  . simethicone (MYLICON) 80 MG chewable tablet Chew 160 mg by mouth daily as needed. For flatulence       No current facility-administered medications on file prior to visit.    Assessment/Plan

## 2013-06-03 NOTE — Assessment & Plan Note (Signed)
Will treat with a stress dose of patient's hydrocortisone. Instructed patient/daughter to take double to usual amount of hydrocortisone, which would mean taking 20mg  each monring and 10mg  each evening for three days.

## 2013-06-03 NOTE — Assessment & Plan Note (Addendum)
I believe the patient's diarrhea represents acute enteritis vs gastroenteritis (Nausea present, no vomiting). Per daughter, patient does have chronic diarrhea since her diagnosis of colon cancer, however this is much different than her usual diarrhea. She is afebrile and is nontoxic on my exam. Her ast hospitalization was over 90 days ago and she has not had any antibiotics for over one year. Therefore, I do not suspect C. difficile at this time. Her mucous membranes may be a bit dry. I encouraged patient and daughter to push fluids at home and avoid caffeinated beverages. I prescribed Lomotil to be taken at most 3 times a day for the next 3 days. I instructed daughter to bring the patient back to clinic if her diarrhea doesn't improve or if it worsens, she develops abdominal pain, or blood in her stool within the next few days. Will check basic lab work today.  -CBC -BMP

## 2013-06-03 NOTE — Patient Instructions (Signed)
Thank you for your visit.  Bleeder diarrhea secondary to viral infection. I prescribed Lomotil to be taken 3 times a day for maximum of 3 days consecutively. If the diarrhea persists after this time please call her clinic and schedule followup appointment. Additionally, if you experience worsening abdominal pain, blood in your stool then please call our clinic.  Please take double the amount of your Hydrocortisone than you usually take. This means taking 20mg  each morning and 10mg  each evening for THREE DAYS while you are sick.  Today we checked some basic lab work. I will call you with the results if anything is abnormal.  I refilled your Norco prescription for your hip pain.  Follow up with your PCP in 2 months, or sooner if your symptoms do not improve.

## 2013-06-04 ENCOUNTER — Telehealth: Payer: Self-pay | Admitting: Internal Medicine

## 2013-06-04 NOTE — Telephone Encounter (Signed)
I called patient and spoke with her daughter, Karena Addison, regarding patient's low potassium. I asked her to make sure patient is receiving her potassium supplementation. I suspect the low potassium (K 3.4) is due to ongoing diarrhea. I encouraged [name] to give her mother high potassium food such as bananas as well. I also told her that her mother's kidney function looks to be at baseline.

## 2013-06-23 ENCOUNTER — Other Ambulatory Visit: Payer: Self-pay | Admitting: Medical Oncology

## 2013-06-24 ENCOUNTER — Telehealth: Payer: Self-pay | Admitting: Internal Medicine

## 2013-06-24 NOTE — Telephone Encounter (Signed)
, °

## 2013-06-30 ENCOUNTER — Encounter: Payer: Self-pay | Admitting: Internal Medicine

## 2013-06-30 ENCOUNTER — Ambulatory Visit (INDEPENDENT_AMBULATORY_CARE_PROVIDER_SITE_OTHER): Payer: Medicare Other | Admitting: Internal Medicine

## 2013-06-30 ENCOUNTER — Ambulatory Visit (HOSPITAL_COMMUNITY): Payer: Medicare Other

## 2013-06-30 VITALS — BP 94/68 | HR 74 | Temp 97.0°F | Wt 124.1 lb

## 2013-06-30 DIAGNOSIS — Z9181 History of falling: Secondary | ICD-10-CM

## 2013-06-30 DIAGNOSIS — K900 Celiac disease: Secondary | ICD-10-CM

## 2013-06-30 DIAGNOSIS — K9041 Non-celiac gluten sensitivity: Secondary | ICD-10-CM

## 2013-06-30 DIAGNOSIS — R269 Unspecified abnormalities of gait and mobility: Secondary | ICD-10-CM

## 2013-06-30 DIAGNOSIS — R197 Diarrhea, unspecified: Secondary | ICD-10-CM

## 2013-06-30 DIAGNOSIS — K529 Noninfective gastroenteritis and colitis, unspecified: Secondary | ICD-10-CM

## 2013-06-30 DIAGNOSIS — R296 Repeated falls: Secondary | ICD-10-CM

## 2013-06-30 DIAGNOSIS — E43 Unspecified severe protein-calorie malnutrition: Secondary | ICD-10-CM

## 2013-06-30 DIAGNOSIS — E876 Hypokalemia: Secondary | ICD-10-CM

## 2013-06-30 LAB — BASIC METABOLIC PANEL WITH GFR
BUN: 22 mg/dL (ref 6–23)
CHLORIDE: 112 meq/L (ref 96–112)
CO2: 22 meq/L (ref 19–32)
Calcium: 8 mg/dL — ABNORMAL LOW (ref 8.4–10.5)
Creat: 1.45 mg/dL — ABNORMAL HIGH (ref 0.50–1.10)
GFR, Est African American: 38 mL/min — ABNORMAL LOW
GFR, Est Non African American: 33 mL/min — ABNORMAL LOW
Glucose, Bld: 87 mg/dL (ref 70–99)
Potassium: 3.4 mEq/L — ABNORMAL LOW (ref 3.5–5.3)
SODIUM: 148 meq/L — AB (ref 135–145)

## 2013-06-30 MED ORDER — ONDANSETRON HCL 4 MG PO TABS: 4.0000 mg | ORAL_TABLET | Freq: Every day | ORAL | Status: DC | PRN

## 2013-06-30 MED ORDER — HYDROCODONE-ACETAMINOPHEN 5-325 MG PO TABS: 1.0000 | ORAL_TABLET | Freq: Every evening | ORAL | Status: DC | PRN

## 2013-06-30 NOTE — Assessment & Plan Note (Signed)
Multiple falls at home due to gait instability. Patient at high risk for another fracture but does not appear to have any acute pain today.  Will refer for PT to improve strength, gait, and decrease fall risk.

## 2013-06-30 NOTE — Patient Instructions (Signed)
Please adhere to the Gluten free diet. Continue to take lomotil. We will organize physical therapy for fall prevention. I will call if any abnormalities of your blood work. Continue taking supplements  Gluten-Free Diet Gluten is a protein found in many grains. Gluten is present in wheat, rye, and barley. Gluten from wheat, rye, and barley protein interferes with the absorption of food in people with gluten sensitivity. It may also cause intestinal injury when eaten by individuals with gluten sensitivity.  A sample piece (biopsy) of the small intestine is usually required for a positive diagnosis of gluten sensitivity. Dietary treatment consists of eliminating foods and food ingredients from wheat, rye, and barley. When these are taken out of the diet completely, most people regain function of the small intestine. Strict compliance is important even during symptom-free periods. People with gluten sensitivity need to be on a gluten-free diet for a lifetime. During the first stages of treatment, some people will also need to restrict dairy products that contain lactose, which is a naturally occurring sugar. Lactose is difficult to absorb when the small intestines are damaged (lactose intolerance).  WHO NEEDS THIS DIET Some people who have certain diseases need to be on a gluten-free diet. These diseases include:  Celiac disease.  Nontropical sprue.  Gluten-sensitive enteropathy.  Dermatitis herpetiformis. SPECIAL NOTES  Read all labels because gluten may have been added as an incidental ingredient. Words to check for on the label include: flour, starch, durum flour, graham flour, phosphated flour, self-rising flour, semolina, farina, modified food starch, cereal, thickening, fillers, emulsifiers, any kind of malt flavoring, and hydrolyzed vegetable protein. A registered dietician can help you identify possible harmful ingredients in the foods you normally eat.  If you are not sure whether an  ingredient contains gluten, check with the manufacturer. Note that some manufacturers may change ingredients without notice. Always read labels.  Since flour and cereal products are often used in the preparation of foods, it is important to be aware of the methods of preparation used, as well as the foods themselves. This is especially true when you are dining out. Starches  Allowed: Only those prepared from arrowroot, corn, potato, rice, and bean flours. Rice wafers(*), pure cornmeal tortillas, popcorn, some crackers, and chips(*). Hot cereals made from cornmeal. Ask your dietician which specific hot and cold cereals are allowed. White or sweet potatoes, yams, hominy, rice or wild rice, and special gluten-free pasta. Some oriental rice noodles or bean noodles.  Avoid: All wheat and rye cereals, wheat germ, barley, bran, graham, malt, bulgur, and millet(-). NOTE: Avoid cereals containing malt as a flavoring, such as rice cereal. Regular noodles, spaghetti, macaroni, and most packaged rice mixes(*). All others containing wheat, rye, or barley. Vegetables  Allowed: All plain, fresh, frozen, or canned vegetables.  Avoid: Creamed vegetables(*) and vegetables canned in sauces(*). Any prepared with wheat, rye, or barley. Fruit  Allowed: All fresh, frozen, canned, or dried fruits. Fruit juices.  Avoid: Thickened or prepared fruits and some pie fillings(*). Meat and Meat Substitutes  Allowed: Meat, fish, poultry, or eggs prepared without added wheat, rye, or barley. Luncheon meat(*), frankfurters(*), and pure meat. All aged cheese and processed cheese products(*). Cottage cheese(+) and cream cheese(+). Dried beans, dried peas, and lentils.  Avoid: Any meat or meat alternate containing wheat, rye, barley, or gluten stabilizers. Bread-containing products, such as Swiss steak, croquettes, and meatloaf. Tuna canned in vegetable broth(*); [place] with HVP injected as part of the basting; any cheese product  containing oat gum  as an ingredient. Milk  Allowed: Milk. Yogurt made with allowed ingredients(*).  Avoid: Commercial chocolate milk which may have cereal added(*). Malted milk. Soups and Combination Foods  Allowed: Homemade broth and soups made with allowed ingredients; some canned or frozen soups are allowed(*). Combination or prepared foods that do not contain gluten(*). Read labels.  Avoid: All soups containing wheat, rye, or barley flour. Bouillon and bouillon cubes that contain hydrolyzed vegetable protein (HVP). Combination or prepared foods that contain gluten(*). Desserts  Allowed:  Custard, some pudding mixes(*), homemade puddings from cornstarch, rice, and tapioca. Gelatin desserts, ices, and sherbet(*). Cake, cookies, and other desserts prepared with allowed flours. Some commercial ice creams(*). Ask your dietician about specific brands of dessert that are allowed.  Avoid: Cakes, cookies, doughnuts, and pastries that are prepared with wheat, rye, or barley flour. Some commercial ice creams(*), ice cream flavors which contain cookies, crumbs, or cheesecake(*). Ice cream cones. All commercially prepared mixes for cakes, cookies, and other desserts(*). Bread pudding and other puddings thickened with flour. Sweets  Allowed: Sugar, honey, syrup(*), molasses, jelly, jam, plain hard candy, marshmallows, gumdrops, homemade candies free from wheat, rye, or barley. Coconut.  Avoid: Commercial candies containing wheat, rye, or barley(*). Certain buttercrunch toffees are dusted with wheat flour. Ask your dietician about specific brands that are not allowed. Chocolate-coated nuts, which are often rolled in flour. Fats and Oils  Allowed: Butter, margarine, vegetable oil, sour cream(+), whipping cream, shortening, lard, cream, mayonnaise(*). Some commercial salad dressings(*). Peanut butter.  Avoid: Some commercial salad dressings(*). Beverages  Allowed: Coffee (regular or decaffeinated),  tea, herbal tea (read label to be sure that no wheat flour has been added). Carbonated beverages and some root beers(*). Wine, sake, and distilled spirits, such as gin, vodka, and whiskey.  Avoid:  Certain cereal beverages. Ask your dietician about specific brands that are not allowed. Beer (unless gluten-free), ale, malted milk, and some root beers, wine, and sake. Condiments/ Miscellaneous  Allowed: Salt, pepper, herbs, spices, extracts, and food colorings. Monosodium glutamate (MSG). Cider, rice, and wine vinegar. Baking soda and baking powder. Certain soy sauces. Ask your dietician about specific brands that are allowed. Nuts, coconut, chocolate, and pure cocoa powder.  Avoid: Some curry powder(*), some dry seasoning mixes(*), some gravy extracts(*), some meat sauces(*), some catsup(*), some prepared mustard(*), horseradish(*), some soy sauce(*), chip dips(*), and some chewing gum(*). Yeast extract (contains barley). Caramel color (may contain malt). Ask your dietician about specific brands of condiments to avoid. Flour and Thickening Agents  Allowed: Arrowroot starch (A); Corn bran (B); Corn flour (B,C,D); Corn germ (B); Cornmeal (B,C,D); Corn starch (A); Potato flour (B,C,E); Potato starch flour (B,C,E); Rice bran (B); Rice flours: Plain, brown (B,C,D,E), and Sweet (A,B,C,F). Rice polish (B,C,G); Soy flour (B,C,G); Tapioca starch (A). The flour and thickening agents described above are good for: (A) Good thickening agent (B) Good when combined with other flours (C) Best combined with milk and eggs in baked products (D) Best in grainy-textured products (E) Produces drier product than other flours (F) Produces moister product than other flours (G) Adds distinct flavor to product. Use in moderation. (*) Check labels and investigate any questionable ingredients.  (-) Additional research is needed before this product can be recommended. (+) Check vegetable gum used. SAMPLE MEAL PLAN Breakfast    Orange juice.  Banana.  Rice or corn cereal.  Toast (gluten-free bread).  Heart-healthy tub margarine.  Jam.  Milk.  Coffee or tea. Lunch  Chicken salad sandwich (with gluten-free bread  and mayonnaise).  Sliced tomatoes.  Heart-healthy tub margarine.  Apple.  Milk.  Coffee or tea. Dinner  [employer].  Baked potato.  Broccoli.  Lettuce salad with gluten-free dressing.  Gluten-free bread.  Custard.  Heart-healthy margarine.  Coffee or tea. These meal plans are provided as samples. Your daily meal plans will vary. Document Released: 04/15/2005 Document Revised: 10/15/2011 Document Reviewed: 05/26/2011 [facility] Patient Information 2014 [place].

## 2013-06-30 NOTE — Assessment & Plan Note (Signed)
Likely related to her Gluten intolerance, nutritional supplements may be contributing. Avoiding gluten will likely improve her diarrhea and nutritional status. -Will check BMP today, patient appears clinically hydrated but already has CKD and is frequently hypokalemic.

## 2013-06-30 NOTE — Assessment & Plan Note (Signed)
Stressed importance of Gluten free diet with daughter as this will likely improve her diarrhea and nutrition status.

## 2013-06-30 NOTE — Progress Notes (Signed)
[facility]   Subjective:   Patient ID: Rachel Bridges female   DOB: 07-20-1924 80 y.o.   MRN: [id number]  HPI: Rachel Bridges is a 80 y.o. female with a history of dementia, CKD stage 3, MGUS, Colon CA, Osteopenia, left hip fracture in July 2014, gluten intolerance, recurrent SBO, GERD, adrenal insufficiency, and protein malnutrition who presents w/ daughter for an acute visit. Her daughter reports that she has fallen twice at home in the last 2 days.  Both falls were in the bedroom when she was transferring back to her bed from her bedside commode.  Her daughter reports she did hit her head with one of the falls but did not lose conscienceness. Rachel Bridges has no current complaints of HA, or other bone or joint pain.  She reports overall she is currently feeling well.  Her daughter notes that she was doing very well after home health PT after her hip fracture but that she seems more unsteady on her feet lately. Her daughter also reports she has continued to have multiple episodes of diarrhea, this is a chronic problem for her and although her previous biopsy did not confirm Celiac disease her diarrhea in the past has improved with a gluten free diet.  Her daughter admits they have not been adherent fully with her gluten free diet. Her daughter reports she is continuing to giver her ensure supplements and lactate milk.  She does note that the ensure supplements seem to also be associated with her diarrhea.  For her MGUS she is followed by Dr. Juliann Mule and has a follow up bone scan this afternoon.   Past Medical History  Diagnosis Date  . MGUS (monoclonal gammopathy of unknown significance)      Followed by Dr. Jamse Arn at [facility] [place]  . Anxiety   . Depression   . Diverticulosis of colon   . Osteopenia   . Lumbar spinal stenosis   . Atrophic vaginitis   . History of tobacco abuse     at least 64 pack year. Started about age 76 and continued until about age  46.  Marland Kitchen Dermatitis   . Hip pain      History  . Actinic keratosis   . Dementia   . Adrenal suppression     central, on chronic steroids, followed by Dr. Buddy Duty of endocrinology  . Gluten intolerance     August 2011 endoscopy with biopsy negative for celiac disease. Prior to this patient had elevated TTG IgG of 93 (normal <20). Dr Collene Mares made clinical diagnosis of gluten intolerance based on diarrhea/symptoms from eating gluten. Needs GF diet.   Daughter tries to control diet.  . Colon cancer     History of bowel  obstruction. on treatment with Colace and MiraLax.  followed by Dr. Collene Mares and Dr. Barkley Bruns  . Shortness of breath     "sometimes; can happen at any time" (10/14/2012)  . Arthritis     "probably" (10/14/2012)  . Renal cyst   . CKD (chronic kidney disease), stage II     Rachel Bridges 10/14/2012  . Stress incontinence in female   . Subcapital fracture of left hip 11/13/2012    Dr Mardelle Matte   Current Outpatient Prescriptions  Medication Sig Dispense Refill  . acetaminophen (TYLENOL) 325 MG tablet Take 325 mg by mouth daily as needed. For pain      . aspirin 81 MG tablet Take 81 mg by mouth daily.       . calcium  citrate-vitamin D (CITRACAL+D) 315-200 MG-UNIT per tablet Take 1 tablet by mouth 2 (two) times daily.  60 tablet  11  . cyanocobalamin (,VITAMIN B-12,) 1000 MCG/ML injection Inject 1 mL (1,000 mcg total) into the muscle every 30 (thirty) days.  1 mL  11  . diphenoxylate-atropine (LOMOTIL) 2.5-0.025 MG per tablet Take 1 tablet by mouth 3 (three) times daily as needed for diarrhea or loose stools.  30 tablet  1  . feeding supplement (ENSURE COMPLETE) LIQD Take 237 mLs by mouth daily. Vanilla.      . furosemide (LASIX) 20 MG tablet Take 1 tablet (20 mg total) by mouth daily.  30 tablet  0  . HYDROcodone-acetaminophen (NORCO) 5-325 MG per tablet Take 1-2 tablets by mouth at bedtime as needed for moderate pain. MAXIMUM TOTAL ACETAMINOPHEN DOSE IS 4000 MG PER DAY  30 tablet  0  . hydrocortisone  (CORTEF) 10 MG tablet Take 5-10 mg by mouth 2 (two) times daily. 10 mg in the a.m. And 5 mg in the afternoon.      . nystatin (MYCOSTATIN/NYSTOP) 100000 UNIT/GM POWD Apply to affected skin as needed for yeast infection in skin folds.  1 Bottle  0  . ondansetron (ZOFRAN ODT) 8 MG disintegrating tablet Take 1 tablet (8 mg total) by mouth every 8 (eight) hours as needed for nausea.  10 tablet  0  . paricalcitol (ZEMPLAR) 1 MCG capsule TAKE 1 CAPSULE BY MOUTH EVERY MONDAY, WEDNESDAY, AND FRIDAY.  90 capsule  3  . potassium chloride (K-DUR) 10 MEQ tablet Take 2 tablets (20 mEq total) by mouth 2 (two) times daily.  60 tablet  3  . sertraline (ZOLOFT) 25 MG tablet Take 1 tablet (25 mg total) by mouth every morning.  30 tablet  6  . sertraline (ZOLOFT) 50 MG tablet Take 1 tablet (50 mg total) by mouth at bedtime.  30 tablet  2  . simethicone (MYLICON) 80 MG chewable tablet Chew 160 mg by mouth daily as needed. For flatulence       No current facility-administered medications for this visit.   No family history on file. History   Social History  . Marital Status: Widowed    Spouse Name: N/A    Number of Children: N/A  . Years of Education: N/A   Social History Main Topics  . Smoking status: Former Smoker -- 1.00 packs/day for 47 years    Types: Cigarettes    Quit date: 08/01/1988  . Smokeless tobacco: Never Used  . Alcohol Use: No  . Drug Use: No  . Sexual Activity: None   Other Topics Concern  . None   Social History Narrative    Husband died of myeloma.  Patient lives with daughter and son in [place], with [facility] services.   Review of Systems: Review of Systems  Constitutional: Negative for fever, chills and malaise/fatigue.  HENT: Negative for congestion, hearing loss and tinnitus.   Eyes: Negative for blurred vision and double vision.  Respiratory: Negative for cough, sputum production and shortness of breath.   Cardiovascular: Negative for chest pain and leg swelling.   Gastrointestinal: Positive for diarrhea. Negative for heartburn, nausea, vomiting, abdominal pain, constipation and blood in stool.  Genitourinary: Negative for dysuria and hematuria.  Musculoskeletal: Positive for falls and joint pain (left hip). Negative for back pain and neck pain.  Neurological: Negative for dizziness, sensory change, speech change, loss of consciousness, weakness and headaches.  Psychiatric/Behavioral: The patient does not have insomnia.  Objective:  Physical Exam: Filed Vitals:   06/30/13 0831  BP: 94/68  Pulse: 74  Temp: 97 F (36.1 C)  TempSrc: Oral  Weight: 124 lb 1.6 oz (56.291 kg)  SpO2: 96%  Physical Exam  Nursing note and vitals reviewed. Constitutional: She is well-developed, well-nourished, and in no distress. No distress.  HENT:  Head: Normocephalic and atraumatic.  Mouth/Throat: Oropharynx is clear and moist.  No scalp tenderness, no hematoma appreciated  Eyes: Conjunctivae and EOM are normal. Pupils are equal, round, and reactive to light.  Neck: Normal range of motion. Neck supple.  Cardiovascular: Normal rate, regular rhythm, normal heart sounds and intact distal pulses.   No murmur heard. Pulmonary/Chest: Effort normal and breath sounds normal. No respiratory distress. She has no wheezes. She has no rales.  Abdominal: Soft. She exhibits no distension. There is no tenderness. There is no rebound and no guarding.  Hyperactive bowel sounds  Musculoskeletal:       Right shoulder: She exhibits no tenderness.       Left shoulder: She exhibits no tenderness.       Right hip: She exhibits no tenderness.       Left hip: She exhibits tenderness (mild).       Right upper arm: She exhibits no tenderness.       Left upper arm: She exhibits no tenderness.       Right forearm: She exhibits no tenderness.       Left forearm: She exhibits no tenderness.       Right upper leg: She exhibits no tenderness.       Left upper leg: She exhibits no  tenderness.       Right lower leg: She exhibits no tenderness.       Left lower leg: She exhibits no tenderness.  Neurological: She displays weakness and abnormal stance. No cranial nerve deficit. Gait abnormal. GCS score is 15.  Reflex Scores:      Brachioradialis reflexes are 2+ on the right side and 2+ on the left side.      Patellar reflexes are 2+ on the right side and 2+ on the left side. Patient unsteady standing, unable to take step without severe instability.  Skin: She is not diaphoretic.    Assessment & Plan:   See Problem Based Assessment and Plan Meds ordered this encounter  Medications  . HYDROcodone-acetaminophen (NORCO) 5-325 MG per tablet    Sig: Take 1-2 tablets by mouth at bedtime as needed for moderate pain. MAXIMUM TOTAL ACETAMINOPHEN DOSE IS 4000 MG PER DAY    Dispense:  30 tablet    Refill:  0  . ondansetron (ZOFRAN) 4 MG tablet    Sig: Take 1 tablet (4 mg total) by mouth daily as needed for nausea or vomiting.    Dispense:  30 tablet    Refill:  1   Orders Placed This Encounter  Procedures  . BMP with Estimated GFR ([id number])  . Ambulatory referral to Physical Therapy    Referral Priority:  Routine    Referral Type:  Physical Medicine    Referral Reason:  Specialty Services Required    Requested Specialty:  Physical Therapy    Number of Visits Requested:  1

## 2013-06-30 NOTE — Assessment & Plan Note (Signed)
Will refer for physical therapy.

## 2013-06-30 NOTE — Assessment & Plan Note (Signed)
Continue gluten free nutritional supplements, patient encouraged to drink lactate milk as also reports lactose intolerance.

## 2013-07-01 NOTE — Progress Notes (Signed)
Case discussed with Dr. Hoffman at the time of the visit.  We reviewed the resident's history and exam and pertinent patient test results.  I agree with the assessment, diagnosis, and plan of care documented in the resident's note. 

## 2013-07-02 ENCOUNTER — Inpatient Hospital Stay (HOSPITAL_COMMUNITY): Admission: RE | Admit: 2013-07-02 | Payer: Medicare Other | Source: Ambulatory Visit

## 2013-07-13 ENCOUNTER — Other Ambulatory Visit: Payer: Self-pay | Admitting: Internal Medicine

## 2013-07-13 DIAGNOSIS — R269 Unspecified abnormalities of gait and mobility: Secondary | ICD-10-CM

## 2013-07-19 ENCOUNTER — Telehealth: Payer: Self-pay | Admitting: *Deleted

## 2013-07-19 NOTE — Telephone Encounter (Signed)
That is OK. Thank You, Joni Reining

## 2013-07-19 NOTE — Telephone Encounter (Signed)
Call from Randall Hiss, Physical Therapist with [facility] [place] request Verbal Orders for home PT twice a week for five weeks. PT referral was requested by Dr Heber .  I gave the verbal order.  Is that okay?

## 2013-07-20 ENCOUNTER — Other Ambulatory Visit: Payer: Self-pay | Admitting: Licensed Clinical Social Worker

## 2013-07-20 ENCOUNTER — Telehealth: Payer: Self-pay | Admitting: Licensed Clinical Social Worker

## 2013-07-20 DIAGNOSIS — R296 Repeated falls: Secondary | ICD-10-CM

## 2013-07-20 NOTE — Telephone Encounter (Signed)
Pt currently receiving [facility] PT and requesting [place] OT and [facility].  These services would be for limited time.  CSW placed call to pt/family, left message.  Dr. Heber [place] in agreement for add'l services.  CSW left message with Randall Hiss, [facility] PT with [facility].

## 2013-07-20 NOTE — Telephone Encounter (Signed)
HH PT indicating family would like assessment for [facility] OT and [facility].  Discussed briefly with Dr. Heber [place].  Dr. Heber  in agreement.  CSW will initiate order per protocol.  Message left with Randall Hiss, PT with [facility].

## 2013-07-21 ENCOUNTER — Emergency Department (HOSPITAL_COMMUNITY): Payer: Medicare Other

## 2013-07-21 ENCOUNTER — Inpatient Hospital Stay (HOSPITAL_COMMUNITY)
Admission: EM | Admit: 2013-07-21 | Discharge: 2013-07-23 | DRG: 388 | Disposition: A | Discharge status: Home Health | Payer: Medicare Other | Attending: Internal Medicine | Admitting: Internal Medicine

## 2013-07-21 ENCOUNTER — Encounter (HOSPITAL_COMMUNITY): Payer: Self-pay | Admitting: Emergency Medicine

## 2013-07-21 DIAGNOSIS — K567 Ileus, unspecified: Secondary | ICD-10-CM | POA: Diagnosis present

## 2013-07-21 DIAGNOSIS — Z96659 Presence of unspecified artificial knee joint: Secondary | ICD-10-CM

## 2013-07-21 DIAGNOSIS — E2740 Unspecified adrenocortical insufficiency: Secondary | ICD-10-CM | POA: Diagnosis present

## 2013-07-21 DIAGNOSIS — D472 Monoclonal gammopathy: Secondary | ICD-10-CM | POA: Diagnosis present

## 2013-07-21 DIAGNOSIS — R1013 Epigastric pain: Secondary | ICD-10-CM

## 2013-07-21 DIAGNOSIS — Z7982 Long term (current) use of aspirin: Secondary | ICD-10-CM

## 2013-07-21 DIAGNOSIS — E876 Hypokalemia: Secondary | ICD-10-CM | POA: Diagnosis present

## 2013-07-21 DIAGNOSIS — Z96649 Presence of unspecified artificial hip joint: Secondary | ICD-10-CM

## 2013-07-21 DIAGNOSIS — F419 Anxiety disorder, unspecified: Secondary | ICD-10-CM | POA: Diagnosis present

## 2013-07-21 DIAGNOSIS — E538 Deficiency of other specified B group vitamins: Secondary | ICD-10-CM | POA: Diagnosis present

## 2013-07-21 DIAGNOSIS — N183 Chronic kidney disease, stage 3 unspecified: Secondary | ICD-10-CM | POA: Diagnosis present

## 2013-07-21 DIAGNOSIS — Z87891 Personal history of nicotine dependence: Secondary | ICD-10-CM

## 2013-07-21 DIAGNOSIS — F0390 Unspecified dementia without behavioral disturbance: Secondary | ICD-10-CM | POA: Diagnosis present

## 2013-07-21 DIAGNOSIS — F3289 Other specified depressive episodes: Secondary | ICD-10-CM | POA: Diagnosis present

## 2013-07-21 DIAGNOSIS — Z85038 Personal history of other malignant neoplasm of large intestine: Secondary | ICD-10-CM

## 2013-07-21 DIAGNOSIS — K529 Noninfective gastroenteritis and colitis, unspecified: Secondary | ICD-10-CM | POA: Diagnosis present

## 2013-07-21 DIAGNOSIS — N179 Acute kidney failure, unspecified: Secondary | ICD-10-CM | POA: Diagnosis present

## 2013-07-21 DIAGNOSIS — F411 Generalized anxiety disorder: Secondary | ICD-10-CM | POA: Diagnosis present

## 2013-07-21 DIAGNOSIS — E872 Acidosis, unspecified: Secondary | ICD-10-CM | POA: Diagnosis present

## 2013-07-21 DIAGNOSIS — E2749 Other adrenocortical insufficiency: Secondary | ICD-10-CM | POA: Diagnosis present

## 2013-07-21 DIAGNOSIS — D649 Anemia, unspecified: Secondary | ICD-10-CM | POA: Diagnosis present

## 2013-07-21 DIAGNOSIS — K9041 Non-celiac gluten sensitivity: Secondary | ICD-10-CM | POA: Diagnosis present

## 2013-07-21 DIAGNOSIS — Z66 Do not resuscitate: Secondary | ICD-10-CM | POA: Diagnosis present

## 2013-07-21 DIAGNOSIS — E8809 Other disorders of plasma-protein metabolism, not elsewhere classified: Secondary | ICD-10-CM | POA: Diagnosis present

## 2013-07-21 DIAGNOSIS — E860 Dehydration: Secondary | ICD-10-CM | POA: Diagnosis present

## 2013-07-21 DIAGNOSIS — F329 Major depressive disorder, single episode, unspecified: Secondary | ICD-10-CM | POA: Diagnosis present

## 2013-07-21 DIAGNOSIS — R197 Diarrhea, unspecified: Secondary | ICD-10-CM | POA: Diagnosis present

## 2013-07-21 DIAGNOSIS — IMO0002 Reserved for concepts with insufficient information to code with codable children: Secondary | ICD-10-CM

## 2013-07-21 DIAGNOSIS — K3189 Other diseases of stomach and duodenum: Secondary | ICD-10-CM | POA: Diagnosis present

## 2013-07-21 DIAGNOSIS — E43 Unspecified severe protein-calorie malnutrition: Secondary | ICD-10-CM | POA: Diagnosis present

## 2013-07-21 DIAGNOSIS — K56609 Unspecified intestinal obstruction, unspecified as to partial versus complete obstruction: Secondary | ICD-10-CM

## 2013-07-21 DIAGNOSIS — K560 Paralytic ileus: Principal | ICD-10-CM | POA: Diagnosis present

## 2013-07-21 DIAGNOSIS — R627 Adult failure to thrive: Secondary | ICD-10-CM | POA: Diagnosis present

## 2013-07-21 LAB — CBC WITH DIFFERENTIAL/PLATELET
Basophils Absolute: 0 10*3/uL (ref 0.0–0.1)
Basophils Relative: 0 % (ref 0–1)
Eosinophils Absolute: 0.1 10*3/uL (ref 0.0–0.7)
Eosinophils Relative: 2 % (ref 0–5)
HCT: 42 % (ref 36.0–46.0)
HEMOGLOBIN: 14.3 g/dL (ref 12.0–15.0)
LYMPHS ABS: 2.8 10*3/uL (ref 0.7–4.0)
Lymphocytes Relative: 36 % (ref 12–46)
MCH: 30.8 pg (ref 26.0–34.0)
MCHC: 34 g/dL (ref 30.0–36.0)
MCV: 90.3 fL (ref 78.0–100.0)
MONOS PCT: 6 % (ref 3–12)
Monocytes Absolute: 0.5 10*3/uL (ref 0.1–1.0)
Neutro Abs: 4.4 10*3/uL (ref 1.7–7.7)
Neutrophils Relative %: 56 % (ref 43–77)
Platelets: 324 10*3/uL (ref 150–400)
RBC: 4.65 MIL/uL (ref 3.87–5.11)
RDW: 14.6 % (ref 11.5–15.5)
WBC: 7.9 10*3/uL (ref 4.0–10.5)

## 2013-07-21 LAB — COMPREHENSIVE METABOLIC PANEL
ALT: 14 U/L (ref 0–35)
AST: 34 U/L (ref 0–37)
Albumin: 2.2 g/dL — ABNORMAL LOW (ref 3.5–5.2)
Alkaline Phosphatase: 165 U/L — ABNORMAL HIGH (ref 39–117)
BUN: 39 mg/dL — ABNORMAL HIGH (ref 6–23)
CO2: 16 meq/L — AB (ref 19–32)
CREATININE: 2.14 mg/dL — AB (ref 0.50–1.10)
Calcium: 7.7 mg/dL — ABNORMAL LOW (ref 8.4–10.5)
Chloride: 106 mEq/L (ref 96–112)
GFR calc Af Amer: 23 mL/min — ABNORMAL LOW (ref >90)
GFR, EST NON AFRICAN AMERICAN: 20 mL/min — AB (ref >90)
Glucose, Bld: 94 mg/dL (ref 70–99)
Potassium: 3.7 mEq/L (ref 3.7–5.3)
Sodium: 143 mEq/L (ref 137–147)
Total Bilirubin: 0.2 mg/dL — ABNORMAL LOW (ref 0.3–1.2)
Total Protein: 6.4 g/dL (ref 6.0–8.3)

## 2013-07-21 LAB — URINALYSIS, ROUTINE W REFLEX MICROSCOPIC
Glucose, UA: NEGATIVE mg/dL
HGB URINE DIPSTICK: NEGATIVE
Ketones, ur: NEGATIVE mg/dL
Leukocytes, UA: NEGATIVE
NITRITE: NEGATIVE
Protein, ur: NEGATIVE mg/dL
SPECIFIC GRAVITY, URINE: 1.023 (ref 1.005–1.030)
UROBILINOGEN UA: 0.2 mg/dL (ref 0.0–1.0)
pH: 5 (ref 5.0–8.0)

## 2013-07-21 LAB — LIPASE, BLOOD: LIPASE: 28 U/L (ref 11–59)

## 2013-07-21 LAB — OCCULT BLOOD X 1 CARD TO LAB, STOOL: FECAL OCCULT BLD: NEGATIVE

## 2013-07-21 MED ORDER — ONDANSETRON HCL 4 MG/2ML IJ SOLN
4.0000 mg | Freq: Once | INTRAMUSCULAR | Status: AC
  Administered 2013-07-21: 4 mg via INTRAVENOUS
  Filled 2013-07-21: qty 2

## 2013-07-21 MED ORDER — SODIUM CHLORIDE 0.9 % IV BOLUS (SEPSIS)
1000.0000 mL | INTRAVENOUS | Status: AC
  Administered 2013-07-21: 1000 mL via INTRAVENOUS

## 2013-07-21 MED ORDER — DEXTROSE-NACL 5-0.45 % IV SOLN
INTRAVENOUS | Status: AC
  Administered 2013-07-22: 02:00:00 via INTRAVENOUS

## 2013-07-21 MED ORDER — IOHEXOL 300 MG/ML  SOLN
25.0000 mL | INTRAMUSCULAR | Status: AC
  Administered 2013-07-21: 25 mL via ORAL

## 2013-07-21 NOTE — ED Notes (Signed)
Pt c/o diarrhea x 1 week with nausea and loss of appetite per family; pt c/o generalized weakness; pt with hx of celiac disease and sts normal loose stools but worse than normal

## 2013-07-21 NOTE — ED Notes (Signed)
Patient transported to CT 

## 2013-07-21 NOTE — H&P (Signed)
Date: 07/21/2013               Patient Name:  Rachel Bridges MRN: [id number]  DOB: Dec 02, 1924 Age / Sex: 80 y.o., female   PCP: Otho Bellows, MD         Medical Service: Internal Medicine Teaching Service         Attending Physician: Dr. Dominic Pea, DO    First Contact: Dr. Naaman Plummer Pager: [phone]  Second Contact: Dr. Hayes Ludwig Pager: [phone]       After Hours (After 5p/  First Contact Pager: [phone]  weekends / holidays): Second Contact Pager: [phone]   Chief Complaint: Diarrhea, weakness, poor po intake  History of Present Illness: Ms. Veverly Larimer is a 80 y.o. female w/ PMHx of CKD, dementia, anxiety, depression, h/o Colon CA s/p resection in 2001, diverticulosis, MGUS, and ?Celiac Disease, presents to the ED w/ family w/ complaints of worsening watery diarrhea for 3 days, weakness, and decreased po intake for 6 days. According to the daughter, the patient had 6 episodes of watery brown diarrhea yesterday and likely more today. The patient has chronic diarrhea w/ ~3 bowel movements daily, however, recently she has had significantly more bowel movements which are more watery in nature. The patient is also describes some associated abdominal pain, nausea w/ no vomiting, lethargy, and weakness. The daughter also describes a sick contact at home (nephew) who has been having nausea, vomiting, and abdominal pain. The patient denies fever, chills, SOB, chest pain, dizziness, lightheadedness, palpitations, or dysuria.  On arrival to the ED, CT abdomen was performed showing significant large bowel dilation, similar to previous admission in 09/2012 when the patient was admitted for ileus.   Meds: No current facility-administered medications for this encounter.   Current Outpatient Prescriptions  Medication Sig Dispense Refill  . acetaminophen (TYLENOL) 325 MG tablet Take 325 mg by mouth daily as needed. For pain      . aspirin 81 MG tablet Take 81 mg by mouth daily.         . calcium citrate-vitamin D (CITRACAL+D) 315-200 MG-UNIT per tablet Take 1 tablet by mouth 2 (two) times daily.  60 tablet  11  . cyanocobalamin (,VITAMIN B-12,) 1000 MCG/ML injection Inject 1 mL (1,000 mcg total) into the muscle every 30 (thirty) days.  1 mL  11  . feeding supplement (ENSURE COMPLETE) LIQD Take 237 mLs by mouth daily. Vanilla.      . furosemide (LASIX) 20 MG tablet Take 20 mg by mouth daily as needed for fluid or edema.      Marland Kitchen HYDROcodone-acetaminophen (NORCO) 5-325 MG per tablet Take 1-2 tablets by mouth at bedtime as needed for moderate pain. MAXIMUM TOTAL ACETAMINOPHEN DOSE IS 4000 MG PER DAY  30 tablet  0  . hydrocortisone (CORTEF) 10 MG tablet Take 5-10 mg by mouth 2 (two) times daily. 10 mg in the a.m. And 5 mg in the afternoon.      . ondansetron (ZOFRAN) 4 MG tablet Take 1 tablet (4 mg total) by mouth daily as needed for nausea or vomiting.  30 tablet  1  . paricalcitol (ZEMPLAR) 1 MCG capsule Take 1 mcg by mouth every Monday, Wednesday, and Friday.      . potassium chloride (K-DUR) 10 MEQ tablet Take 2 tablets (20 mEq total) by mouth 2 (two) times daily.  60 tablet  3  . sertraline (ZOLOFT) 25 MG tablet Take 1 tablet (25 mg total) by mouth every morning.  [place]  tablet  6  . sertraline (ZOLOFT) 50 MG tablet Take 1 tablet (50 mg total) by mouth at bedtime.  30 tablet  2  . simethicone (MYLICON) 80 MG chewable tablet Chew 160 mg by mouth daily as needed. For flatulence        Allergies: Allergies as of 07/21/2013 - Review Complete 07/21/2013  Allergen Reaction Noted  . Oxycodone hcl Other (See Comments) 03/02/2011  . Ambien [zolpidem tartrate] Other (See Comments) 12/25/2012  . Morphine and related Other (See Comments) 03/02/2011  . Pantoprazole [pantoprazole sodium] Nausea Only 03/02/2011   Past Medical History  Diagnosis Date  . MGUS (monoclonal gammopathy of unknown significance)      Followed by Dr. Jamse Arn at [facility] [place]  . Anxiety   . Depression   .  Diverticulosis of colon   . Osteopenia   . Lumbar spinal stenosis   . Atrophic vaginitis   . History of tobacco abuse     at least 64 pack year. Started about age 78 and continued until about age 41.  Marland Kitchen Dermatitis   . Hip pain      History  . Actinic keratosis   . Dementia   . Adrenal suppression     central, on chronic steroids, followed by Dr. Buddy Duty of endocrinology  . Gluten intolerance     August 2011 endoscopy with biopsy negative for celiac disease. Prior to this patient had elevated TTG IgG of 93 (normal <20). Dr Collene Mares made clinical diagnosis of gluten intolerance based on diarrhea/symptoms from eating gluten. Needs GF diet.   Daughter tries to control diet.  . Colon cancer     History of bowel  obstruction. on treatment with Colace and MiraLax.  followed by Dr. Collene Mares and Dr. Barkley Bruns  . Shortness of breath     "sometimes; can happen at any time" (10/14/2012)  . Arthritis     "probably" (10/14/2012)  . Renal cyst   . CKD (chronic kidney disease), stage II     Archie Endo 10/14/2012  . Stress incontinence in female   . Subcapital fracture of left hip 11/13/2012    Dr Mardelle Matte   Past Surgical History  Procedure Laterality Date  . Hemicolectomy       Right-sided  . Ovarian cyst removal    . Cataract extraction w/ intraocular lens  implant, bilateral Bilateral   . Knee arthroscopy Left   . Appendectomy    . Carpal tunnel release Left   . Lumbar laminectomy    . Lumbar fusion    . Replacement total knee Left   . Tonsillectomy  1930's  . Hip arthroplasty Left 11/13/2012    Procedure: ARTHROPLASTY BIPOLAR HIP;  Surgeon: Johnny Bridge, MD;  Location: [place];  Service: Orthopedics;  Laterality: Left;   History reviewed. No pertinent family history. History   Social History  . Marital Status: Widowed    Spouse Name: N/A    Number of Children: N/A  . Years of Education: N/A   Occupational History  . Not on file.   Social History Main Topics  . Smoking status: Former Smoker --  1.00 packs/day for 47 years    Types: Cigarettes    Quit date: 08/01/1988  . Smokeless tobacco: Never Used  . Alcohol Use: No  . Drug Use: No  . Sexual Activity: Not on file   Other Topics Concern  . Not on file   Social History Narrative    Husband died of myeloma.  Patient lives with  daughter and son in [place], with [facility] services.   Review of Systems: General: Positive for decreased appetite and fatigue. Denies fever, chills, and diaphoresis.  Respiratory: Denies SOB, DOE, cough, chest tightness, and wheezing.   Cardiovascular: Denies chest pain and palpitations.  Gastrointestinal: Positive for abdominal pain, mild nausea, and diarrhea. Denies vomiting, constipation, blood in stool and abdominal distention.  Genitourinary: Denies dysuria, urgency, frequency, hematuria, and flank pain. Endocrine: Denies hot or cold intolerance, polyuria, and polydipsia. Musculoskeletal: Denies myalgias, back pain, joint swelling, arthralgias and gait problem.  Skin: Denies pallor, rash and wounds.  Neurological: Positive for weakness. Denies dizziness, seizures, syncope, lightheadedness, numbness and headaches.  Psychiatric/Behavioral: Denies mood changes, confusion, nervousness, sleep disturbance and agitation.  Physical Exam: Filed Vitals:   07/21/13 1930 07/21/13 2000 07/21/13 2030 07/21/13 2130  BP: 100/61 100/60 103/63 95/58  Pulse:    69  Temp:      TempSrc:      Resp: 13 16 20 15   SpO2:    94%  General: Vital signs reviewed.  Patient is an mildly ill-appearing elderly female, in no acute distress and cooperative with exam. Lethargic and dry on exam. Head: Normocephalic and atraumatic. Eyes: PERRL, EOMI, conjunctivae normal, No scleral icterus.  Neck: Supple, trachea midline, normal ROM, No JVD, masses, thyromegaly, or carotid bruit present.  Cardiovascular: RRR, S1 normal, S2 normal, no murmurs, gallops, or rubs. Pulmonary/Chest: Air entry equal bilaterally, no wheezes,  rales, or rhonchi. Abdominal: Soft, non-tender, mildly distended, overactive bowel sounds. No masses, organomegaly, or guarding present.  Musculoskeletal: No joint deformities, erythema, or stiffness, ROM full and nontender. Extremities: No swelling or edema,  pulses symmetric and intact bilaterally. No cyanosis or clubbing. Mild ecchymosis on LE's bilaterally. Neurological: A&O x3, Strength is normal and symmetric bilaterally, cranial nerve II-XII are grossly intact, no focal motor deficit, sensory intact to light touch bilaterally.  Skin: Warm, dry and intact. No rashes or erythema.  Psychiatric: Normal mood and affect. Speech and behavior is normal. Cognition and memory are normal.   Lab results: Basic Metabolic Panel:  Recent Labs  07/21/13 1316  NA 143  K 3.7  CL 106  CO2 16*  GLUCOSE 94  BUN 39*  CREATININE 2.14*  CALCIUM 7.7*   Liver Function Tests:  Recent Labs  07/21/13 1316  AST 34  ALT 14  ALKPHOS 165*  BILITOT 0.2*  PROT 6.4  ALBUMIN 2.2*    Recent Labs  07/21/13 1316  LIPASE 28   CBC:  Recent Labs  07/21/13 1316  WBC 7.9  NEUTROABS 4.4  HGB 14.3  HCT 42.0  MCV 90.3  PLT 324   Urinalysis:  Recent Labs  07/21/13 1850  COLORURINE YELLOW  LABSPEC 1.023  PHURINE 5.0  GLUCOSEU NEGATIVE  HGBUR NEGATIVE  BILIRUBINUR SMALL*  KETONESUR NEGATIVE  PROTEINUR NEGATIVE  UROBILINOGEN 0.2  NITRITE NEGATIVE  LEUKOCYTESUR NEGATIVE   Imaging results:  Ct Abdomen Pelvis Wo Contrast  07/21/2013   CLINICAL DATA:  lower abd pain, worse in RLQ, diarrhea, does report hx of appy  EXAM: CT ABDOMEN AND PELVIS WITHOUT CONTRAST  TECHNIQUE: Multidetector CT imaging of the abdomen and pelvis was performed following the standard protocol without intravenous contrast.  COMPARISON:  DG PELVIS 1-2 VIEWS dated 03/25/2013; CT ABD/PELVIS W CM dated 10/14/2012; CT ABD/PELVIS W CM dated 11/16/2011  FINDINGS: Stable 5 mm and nodule anterior base right lung peripheral scarring  base of the left lung lungs otherwise clear.  Non contrasted evaluation of the  liver, spleen, adrenals, pancreas is unremarkable. Stable benign-appearing cyst within the right and left kidneys.  When compared to previous study persistent gaseous distention of loops of large bowel are appreciated with the decompression in the distal transverse colon at the level of the splenic flexure. Persistent dilated loops of small bowel are again identified and also appear unchanged. There is no evidence of free air.  There is no evidence of abdominal or pelvic masses, free fluid, no loculated fluid collections.  Stable postsurgical changes are appreciated within the thoracolumbar spine. No aggressive appearing osseous lesions are identified.  IMPRESSION: Continued gaseous distention of the large bowel and dilated loops of small bowel. When compared to the previous study these findings are stable. No definitive mechanical obstruction is appreciated. These findings raise concern of obstruction secondary to dysmotility. Differential considerations Ogilvie's syndrome.  Stable nodule right lung base likely benign.   Electronically Signed   By: Margaree Mackintosh M.D.   On: 07/21/2013 21:18   Other results: EKG: pending  Assessment & Plan by Problem:  Ms. Kemper Hochman is a 80 y.o. female w/ PMHx of CKD, dementia, anxiety, depression, h/o Colon CA, diverticulosis, MGUS, and ?Celiac Disease, admitted for suspected ileus.  Suspected Ileus- Patient w/ decreased po intake for the past 6 days, diarrhea for 3 days. No vomiting noted. Some abdominal pain yesterday, but none today. CT abdomen performed in ED, showed significant gaseous distention of large bowel as well as some dilated small bowel loops, similar to previous admission in 09/2012 for ileus. No obvious obstruction noted. Patient w/ a remote h/o Colon CA s/p partial bowel resection in 2001, as well as Celiac Disease. Patient was previously worked up for this, biopsy  found to be negative, but symptomatically improved w/ Gluten free diet. Patient's daughter claims patient is not always complaint w/ gluten-free diet. 09/2012 admission, patient was treated for ileus, NGT placed, w/ high output and significant improvement. No leukocytosis on this admission.  -Admit to med-surg -Place NGT for decompression -NPO for now -Zofran prn -Baseline EKG -IVF; D5 1/2 NS @ 100 ml/hr for 12 hours  Acute on CKD- Baseline Cr 1.3, Cr 2.14 w/ BUN of 39. on admission, most likely pre-renal in nature given poor po intake and GI losses. Patient also w/ AG metabolic acidosis, HCO3 of 16, AG of 21, not present at baseline. On Lasix 20 mg po prn for LE edema. -Lactic acid pending -Urine Urea + Cr for FeUrea -IVF as above -Hold Lasix for now  Adrenal Insufficiency- Patient on Hydrocortisone 10 mg po in AM + 5 mg po in PM at home. -Solu-Cortef 25 mg IV bid  Anxiety/depression- Patient on Zoloft 25 mg po in AM + 50 mg po in PM.  -Hold for now while NPO  Dementia- Patient w/ baseline dementia, A&O x3. Stable at this time.  DVT/PE PPx- [facility]  Dispo: Disposition is deferred at this time, awaiting improvement of current medical problems. Anticipated discharge in approximately 2-3 day(s).   The patient does have a current PCP Otho Bellows, MD) and does need an [facility] hospital follow-up appointment after discharge.  The patient does not have transportation limitations that hinder transportation to clinic appointments.  Signed: Corky Sox, MD 07/21/2013, 9:45 PM

## 2013-07-21 NOTE — ED Provider Notes (Signed)
CSN: [id number]     Arrival date & time 07/21/13  1300 History   First MD Initiated Contact with Patient 07/21/13 1657     Chief Complaint  Patient presents with  . Diarrhea  . Weakness     (Consider location/radiation/quality/duration/timing/severity/associated sxs/prior Treatment) Patient is a 80 y.o. female presenting with diarrhea and weakness. The history is provided by the patient and a caregiver.  Diarrhea Quality:  Watery Severity:  Moderate Onset quality:  Gradual Number of episodes:  6 yesterday Duration:  3 days Timing:  Constant Progression:  Worsening Relieved by:  Nothing Worsened by:  Nothing tried Ineffective treatments:  None tried Associated symptoms: abdominal pain   Associated symptoms: no fever, no headaches and no vomiting   Weakness Associated symptoms include abdominal pain. Pertinent negatives include no chest pain, no headaches and no shortness of breath.    Past Medical History  Diagnosis Date  . MGUS (monoclonal gammopathy of unknown significance)      Followed by Dr. Jamse Arn at [facility] [place]  . Anxiety   . Depression   . Diverticulosis of colon   . Osteopenia   . Lumbar spinal stenosis   . Atrophic vaginitis   . History of tobacco abuse     at least 64 pack year. Started about age 80 and continued until about age 73.  Marland Kitchen Dermatitis   . Hip pain      History  . Actinic keratosis   . Dementia   . Adrenal suppression     central, on chronic steroids, followed by Dr. Buddy Duty of endocrinology  . Gluten intolerance     August 2011 endoscopy with biopsy negative for celiac disease. Prior to this patient had elevated TTG IgG of 93 (normal <20). Dr Collene Mares made clinical diagnosis of gluten intolerance based on diarrhea/symptoms from eating gluten. Needs GF diet.   Daughter tries to control diet.  . Colon cancer     History of bowel  obstruction. on treatment with Colace and MiraLax.  followed by Dr. Collene Mares and Dr. Barkley Bruns  . Shortness of  breath     "sometimes; can happen at any time" (10/14/2012)  . Arthritis     "probably" (10/14/2012)  . Renal cyst   . CKD (chronic kidney disease), stage II     Archie Endo 10/14/2012  . Stress incontinence in female   . Subcapital fracture of left hip 11/13/2012    Dr Mardelle Matte   Past Surgical History  Procedure Laterality Date  . Hemicolectomy       Right-sided  . Ovarian cyst removal    . Cataract extraction w/ intraocular lens  implant, bilateral Bilateral   . Knee arthroscopy Left   . Appendectomy    . Carpal tunnel release Left   . Lumbar laminectomy    . Lumbar fusion    . Replacement total knee Left   . Tonsillectomy  1930's  . Hip arthroplasty Left 11/13/2012    Procedure: ARTHROPLASTY BIPOLAR HIP;  Surgeon: Johnny Bridge, MD;  Location: [place];  Service: Orthopedics;  Laterality: Left;   History reviewed. No pertinent family history. History  Substance Use Topics  . Smoking status: Former Smoker -- 1.00 packs/day for 47 years    Types: Cigarettes    Quit date: 08/01/1988  . Smokeless tobacco: Never Used  . Alcohol Use: No   OB History   Grav Para Term Preterm Abortions TAB SAB Ect Mult Living  Review of Systems  Constitutional: Negative for fever and fatigue.  HENT: Negative for congestion and drooling.   Eyes: Negative for pain.  Respiratory: Negative for cough and shortness of breath.   Cardiovascular: Negative for chest pain.  Gastrointestinal: Positive for nausea, abdominal pain and diarrhea. Negative for vomiting.  Genitourinary: Negative for dysuria and hematuria.  Musculoskeletal: Negative for back pain, gait problem and neck pain.  Skin: Negative for color change.  Neurological: Positive for weakness. Negative for dizziness and headaches.  Hematological: Negative for adenopathy.  Psychiatric/Behavioral: Negative for behavioral problems.  All other systems reviewed and are negative.      Allergies  Oxycodone hcl; Ambien; Morphine and  related; and Pantoprazole  Home Medications   Current Outpatient Rx  Name  Route  Sig  Dispense  Refill  . acetaminophen (TYLENOL) 325 MG tablet   Oral   Take 325 mg by mouth daily as needed. For pain         . aspirin 81 MG tablet   Oral   Take 81 mg by mouth daily.          . calcium citrate-vitamin D (CITRACAL+D) 315-200 MG-UNIT per tablet   Oral   Take 1 tablet by mouth 2 (two) times daily.   60 tablet   11   . cyanocobalamin (,VITAMIN B-12,) 1000 MCG/ML injection   Intramuscular   Inject 1 mL (1,000 mcg total) into the muscle every 30 (thirty) days.   1 mL   11   . feeding supplement (ENSURE COMPLETE) LIQD   Oral   Take 237 mLs by mouth daily. Vanilla.         . furosemide (LASIX) 20 MG tablet   Oral   Take 20 mg by mouth daily as needed for fluid or edema.         Marland Kitchen HYDROcodone-acetaminophen (NORCO) 5-325 MG per tablet   Oral   Take 1-2 tablets by mouth at bedtime as needed for moderate pain. MAXIMUM TOTAL ACETAMINOPHEN DOSE IS 4000 MG PER DAY   30 tablet   0   . hydrocortisone (CORTEF) 10 MG tablet   Oral   Take 5-10 mg by mouth 2 (two) times daily. 10 mg in the a.m. And 5 mg in the afternoon.         . ondansetron (ZOFRAN) 4 MG tablet   Oral   Take 1 tablet (4 mg total) by mouth daily as needed for nausea or vomiting.   30 tablet   1   . paricalcitol (ZEMPLAR) 1 MCG capsule   Oral   Take 1 mcg by mouth every Monday, Wednesday, and Friday.         . potassium chloride (K-DUR) 10 MEQ tablet   Oral   Take 2 tablets (20 mEq total) by mouth 2 (two) times daily.   60 tablet   3   . sertraline (ZOLOFT) 25 MG tablet   Oral   Take 1 tablet (25 mg total) by mouth every morning.   30 tablet   6   . sertraline (ZOLOFT) 50 MG tablet   Oral   Take 1 tablet (50 mg total) by mouth at bedtime.   30 tablet   2   . simethicone (MYLICON) 80 MG chewable tablet   Oral   Chew 160 mg by mouth daily as needed. For flatulence          BP 83/60   Pulse 89  Temp(Src) 92.5 F (33.6 C) (Oral)  Resp  16  SpO2 92% Physical Exam  Nursing note and vitals reviewed. Constitutional: She is oriented to person, place, and time. She appears well-developed and well-nourished.  HENT:  Head: Normocephalic and atraumatic.  Mouth/Throat: Oropharynx is clear and moist. No oropharyngeal exudate.  Eyes: Conjunctivae and EOM are normal. Pupils are equal, round, and reactive to light.  Neck: Normal range of motion. Neck supple.  Cardiovascular: Normal rate, regular rhythm, normal heart sounds and intact distal pulses.  Exam reveals no gallop and no friction rub.   No murmur heard. Pulmonary/Chest: Effort normal and breath sounds normal. No respiratory distress. She has no wheezes.  Abdominal: Soft. Bowel sounds are normal. She exhibits distension (mild). There is no tenderness. There is no rebound and no guarding.  Genitourinary:  Mild non-inflamed external hemorrhoid noted. Brown stool. No blood.   Musculoskeletal: Normal range of motion. She exhibits no edema and no tenderness.  Neurological: She is alert and oriented to person, place, and time.  Skin: Skin is warm and dry.  Psychiatric: She has a normal mood and affect. Her behavior is normal.    ED Course  Procedures (including critical care time) Labs Review Labs Reviewed  COMPREHENSIVE METABOLIC PANEL - Abnormal; Notable for the following:    CO2 16 (*)    BUN 39 (*)    Creatinine, Ser 2.14 (*)    Calcium 7.7 (*)    Albumin 2.2 (*)    Alkaline Phosphatase 165 (*)    Total Bilirubin 0.2 (*)    GFR calc non Af Amer 20 (*)    GFR calc Af Amer 23 (*)    All other components within normal limits  URINALYSIS, ROUTINE W REFLEX MICROSCOPIC - Abnormal; Notable for the following:    Bilirubin Urine SMALL (*)    All other components within normal limits  URINE CULTURE  STOOL CULTURE  CLOSTRIDIUM DIFFICILE BY PCR  CBC WITH DIFFERENTIAL  LIPASE, BLOOD  OCCULT BLOOD X 1 CARD TO LAB, STOOL    Imaging Review Ct Abdomen Pelvis Wo Contrast  07/21/2013   CLINICAL DATA:  lower abd pain, worse in RLQ, diarrhea, does report hx of appy  EXAM: CT ABDOMEN AND PELVIS WITHOUT CONTRAST  TECHNIQUE: Multidetector CT imaging of the abdomen and pelvis was performed following the standard protocol without intravenous contrast.  COMPARISON:  DG PELVIS 1-2 VIEWS dated 03/25/2013; CT ABD/PELVIS W CM dated 10/14/2012; CT ABD/PELVIS W CM dated 11/16/2011  FINDINGS: Stable 5 mm and nodule anterior base right lung peripheral scarring base of the left lung lungs otherwise clear.  Non contrasted evaluation of the liver, spleen, adrenals, pancreas is unremarkable. Stable benign-appearing cyst within the right and left kidneys.  When compared to previous study persistent gaseous distention of loops of large bowel are appreciated with the decompression in the distal transverse colon at the level of the splenic flexure. Persistent dilated loops of small bowel are again identified and also appear unchanged. There is no evidence of free air.  There is no evidence of abdominal or pelvic masses, free fluid, no loculated fluid collections.  Stable postsurgical changes are appreciated within the thoracolumbar spine. No aggressive appearing osseous lesions are identified.  IMPRESSION: Continued gaseous distention of the large bowel and dilated loops of small bowel. When compared to the previous study these findings are stable. No definitive mechanical obstruction is appreciated. These findings raise concern of obstruction secondary to dysmotility. Differential considerations Ogilvie's syndrome.  Stable nodule right lung base likely benign.   Electronically Signed  By: Margaree Mackintosh M.D.   On: 07/21/2013 21:18     EKG Interpretation None      MDM   Final diagnoses:  Diarrhea  Dehydration  Metabolic acidosis    9:89 PM 80 y.o. female w/ hx of celiac dis, MGUS, CKD, remote hx of colon cancer who presents with decreased  oral intake and diarrhea. The family states that she's had decreased oral intake for the last 6 days and worsening watery brown diarrhea over the last 2-3 days. The daughter notes 6 episodes of diarrhea yesterday. The patient is tolerating po but is only able to take small sips at home. She notes that she had some mild lower abdominal pain yesterday but denies this today. Her abdomen is soft and mildly distended on exam. She is afebrile w soft BP's here. I'll signs otherwise unremarkable. Screening labwork shows an elevated creatinine and decreased CO2. Will send Hemoccult and get CT of abdomen without contrast due to elevated creatinine. Will give a 1L bolus.   The patient will be admitted to internal medicine teaching service. Any medications given in the ED during this visit are listed below:  Medications  iohexol (OMNIPAQUE) 300 MG/ML solution 25 mL (0 mLs Oral Hold 07/21/13 2247)  dextrose 5 %-0.45 % sodium chloride infusion (not administered)  sodium chloride 0.9 % bolus 1,000 mL (0 mLs Intravenous Stopped 07/21/13 1900)  ondansetron (ZOFRAN) injection 4 mg (4 mg Intravenous Given 07/21/13 1859)        Blanchard Kelch, MD 07/22/13 [phone]

## 2013-07-22 ENCOUNTER — Other Ambulatory Visit: Payer: Self-pay

## 2013-07-22 ENCOUNTER — Encounter (HOSPITAL_COMMUNITY): Payer: Self-pay | Admitting: Surgery

## 2013-07-22 DIAGNOSIS — R197 Diarrhea, unspecified: Secondary | ICD-10-CM

## 2013-07-22 DIAGNOSIS — N179 Acute kidney failure, unspecified: Secondary | ICD-10-CM

## 2013-07-22 DIAGNOSIS — K560 Paralytic ileus: Principal | ICD-10-CM

## 2013-07-22 LAB — UREA NITROGEN, URINE: Urea Nitrogen, Ur: 565 mg/dL

## 2013-07-22 LAB — BASIC METABOLIC PANEL
BUN: 36 mg/dL — ABNORMAL HIGH (ref 6–23)
BUN: 33 mg/dL — ABNORMAL HIGH (ref 6–23)
CALCIUM: 7 mg/dL — AB (ref 8.4–10.5)
CHLORIDE: 110 meq/L (ref 96–112)
CO2: 17 mEq/L — ABNORMAL LOW (ref 19–32)
CO2: 16 mEq/L — ABNORMAL LOW (ref 19–32)
Calcium: 7.4 mg/dL — ABNORMAL LOW (ref 8.4–10.5)
Chloride: 114 mEq/L — ABNORMAL HIGH (ref 96–112)
Creatinine, Ser: 2.13 mg/dL — ABNORMAL HIGH (ref 0.50–1.10)
Creatinine, Ser: 1.95 mg/dL — ABNORMAL HIGH (ref 0.50–1.10)
GFR calc Af Amer: 23 mL/min — ABNORMAL LOW (ref >90)
GFR calc Af Amer: 26 mL/min — ABNORMAL LOW (ref >90)
GFR calc non Af Amer: 20 mL/min — ABNORMAL LOW (ref >90)
GFR, EST NON AFRICAN AMERICAN: 22 mL/min — AB (ref >90)
GLUCOSE: 115 mg/dL — AB (ref 70–99)
GLUCOSE: 99 mg/dL (ref 70–99)
POTASSIUM: 3.2 meq/L — AB (ref 3.7–5.3)
Potassium: 3 mEq/L — ABNORMAL LOW (ref 3.7–5.3)
SODIUM: 145 meq/L (ref 137–147)
Sodium: 142 mEq/L (ref 137–147)

## 2013-07-22 LAB — CLOSTRIDIUM DIFFICILE BY PCR: Toxigenic C. Difficile by PCR: NEGATIVE

## 2013-07-22 LAB — TROPONIN I
Troponin I: <0.3 ng/mL (ref <0.30)
Troponin I: <0.3 ng/mL (ref <0.30)

## 2013-07-22 LAB — CBC
HCT: 34.1 % — ABNORMAL LOW (ref 36.0–46.0)
Hemoglobin: 11.4 g/dL — ABNORMAL LOW (ref 12.0–15.0)
MCH: 29.8 pg (ref 26.0–34.0)
MCHC: 33.4 g/dL (ref 30.0–36.0)
MCV: 89.3 fL (ref 78.0–100.0)
PLATELETS: 226 10*3/uL (ref 150–400)
RBC: 3.82 MIL/uL — AB (ref 3.87–5.11)
RDW: 14.5 % (ref 11.5–15.5)
WBC: 5.8 10*3/uL (ref 4.0–10.5)

## 2013-07-22 LAB — URINE CULTURE
COLONY COUNT: NO GROWTH
Culture: NO GROWTH

## 2013-07-22 LAB — SODIUM, URINE, RANDOM: SODIUM UR: 20 meq/L

## 2013-07-22 LAB — TSH: TSH: 3.155 u[IU]/mL (ref 0.350–4.500)

## 2013-07-22 LAB — CREATININE, URINE, RANDOM: Creatinine, Urine: 131.92 mg/dL

## 2013-07-22 LAB — LACTIC ACID, PLASMA: LACTIC ACID, VENOUS: 1.1 mmol/L (ref 0.5–2.2)

## 2013-07-22 LAB — MAGNESIUM
Magnesium: 1.3 mg/dL — ABNORMAL LOW (ref 1.5–2.5)
Magnesium: 1.4 mg/dL — ABNORMAL LOW (ref 1.5–2.5)

## 2013-07-22 MED ORDER — HEPARIN SODIUM (PORCINE) 5000 UNIT/ML IJ SOLN
5000.0000 [IU] | Freq: Three times a day (TID) | INTRAMUSCULAR | Status: DC
  Administered 2013-07-22 – 2013-07-23 (×4): 5000 [IU] via SUBCUTANEOUS
  Filled 2013-07-22 (×7): qty 1

## 2013-07-22 MED ORDER — SERTRALINE HCL 50 MG PO TABS
50.0000 mg | ORAL_TABLET | Freq: Every day | ORAL | Status: DC
  Administered 2013-07-22: 50 mg via ORAL
  Filled 2013-07-22 (×2): qty 1

## 2013-07-22 MED ORDER — MAGNESIUM SULFATE 40 MG/ML IJ SOLN
2.0000 g | Freq: Once | INTRAMUSCULAR | Status: AC
  Administered 2013-07-22: 2 g via INTRAVENOUS
  Filled 2013-07-22: qty 50

## 2013-07-22 MED ORDER — DEXTROSE-NACL 5-0.9 % IV SOLN: INTRAVENOUS | Status: DC

## 2013-07-22 MED ORDER — SODIUM CHLORIDE 0.9 % IV SOLN: 250.0000 mL | INTRAVENOUS | Status: DC | PRN

## 2013-07-22 MED ORDER — POTASSIUM CHLORIDE 10 MEQ/100ML IV SOLN
10.0000 meq | INTRAVENOUS | Status: AC
  Administered 2013-07-22 (×2): 10 meq via INTRAVENOUS
  Filled 2013-07-22 (×2): qty 100

## 2013-07-22 MED ORDER — HYDROCORTISONE NA SUCCINATE PF 100 MG IJ SOLR
25.0000 mg | Freq: Two times a day (BID) | INTRAMUSCULAR | Status: DC
  Administered 2013-07-22 – 2013-07-23 (×3): 25 mg via INTRAVENOUS
  Filled 2013-07-22 (×5): qty 0.5

## 2013-07-22 MED ORDER — DEXTROSE-NACL 5-0.9 % IV SOLN
INTRAVENOUS | Status: AC
  Administered 2013-07-22: 17:00:00 via INTRAVENOUS

## 2013-07-22 MED ORDER — SERTRALINE HCL 25 MG PO TABS
25.0000 mg | ORAL_TABLET | Freq: Every morning | ORAL | Status: DC
  Administered 2013-07-23: 25 mg via ORAL
  Filled 2013-07-22: qty 1

## 2013-07-22 MED ORDER — SODIUM CHLORIDE 0.9 % IJ SOLN: 3.0000 mL | INTRAMUSCULAR | Status: DC | PRN

## 2013-07-22 MED ORDER — ONDANSETRON HCL 4 MG/2ML IJ SOLN: 4.0000 mg | Freq: Four times a day (QID) | INTRAMUSCULAR | Status: DC | PRN

## 2013-07-22 MED ORDER — PROMETHAZINE HCL 25 MG/ML IJ SOLN: 12.5000 mg | Freq: Four times a day (QID) | INTRAMUSCULAR | Status: DC | PRN

## 2013-07-22 MED ORDER — POTASSIUM CHLORIDE ER 10 MEQ PO TBCR
20.0000 meq | EXTENDED_RELEASE_TABLET | Freq: Two times a day (BID) | ORAL | Status: DC
  Administered 2013-07-22: 20 meq via ORAL
  Filled 2013-07-22 (×3): qty 2

## 2013-07-22 MED ORDER — SODIUM CHLORIDE 0.9 % IJ SOLN
3.0000 mL | Freq: Two times a day (BID) | INTRAMUSCULAR | Status: DC
  Administered 2013-07-22 – 2013-07-23 (×2): 3 mL via INTRAVENOUS

## 2013-07-22 NOTE — Care Management Note (Signed)
    Page 1 of 2   07/23/2013     10:51:58 AM   CARE MANAGEMENT NOTE 07/23/2013  Patient:  Rachel Bridges, Rachel Bridges   Account Number:  [id number]  Date Initiated:  07/22/2013  Documentation initiated by:  Rachel Bridges  Subjective/Objective Assessment:   dx illeus  admit- lives with daughter.  Patient is active with [facility] for [place].     Action/Plan:   pt/ot eval-rec  hhpt/ot   Anticipated DC Date:  07/23/2013   Anticipated DC Plan:  [place]  CM consult      [facility] Choice  HOME HEALTH  Resumption Of Svcs/PTA Provider   Choice offered to / List presented to:  C-4 Adult Children        HH arranged  HH-1 RN  [place] agency  [employer]   Status of service:  Completed, signed off Medicare Important Message given?   (If response is "NO", the following Medicare IM given date fields will be blank) Date Medicare IM given:   Date Additional Medicare IM given:    Discharge Disposition:  [place]  Per UR Regulation:  Reviewed for med. necessity/level of care/duration of stay  If discussed at [place] of Stay Meetings, dates discussed:    Comments:  07/23/13 [place],  BSN [phone] NCM called and left message for patient's daughter , Rachel Bridges to call back concering support. NCM called Rachel Bridges as well the POA and informed her that patient is for dc today, and she states they want to cont with [place], she states patient lives with her and her husband and they will be with her 24hrs/day, informed her that patient will need some ast with her rolling walker when she ambulates should not ambulate alone right now, she stated that will be no problem.  Patient has rolling walker, bsc and shower chair at home.  Rachel Bridges states she will be picking patient up and transporting by car.   Patient is for dc today with [place] [facility] with [place] for hhrn, pt, ot, and aide.  NCM  notified Rachel Bridges with [facility].  Soc will begin 24-48 hrs post discharge.  07/22/13 [place], BSN [phone] patient lives with daughter, she is active with [place] for hhpt,  await pt /ot eval. NCM will continue to follow for dc needs

## 2013-07-22 NOTE — Progress Notes (Signed)
Rachel Bridges is an 80 y.o. female.   Chief Complaint: Diarrhea, Weakness, Poor PO intake HPI: Rachel Bridges is a 80 y.o. female w/ PMHx of CKD, dementia, anxiety, depression, h/o Colon CA s/p resection in 2001, diverticulosis, MGUS, and ?Celiac Disease, presents to the ED w/ family w/ complaints of worsening watery diarrhea for 3 days, weakness, and decreased po intake for 6 days. According to the daughter, the patient had 6 episodes of watery brown diarrhea yesterday and likely more today. The patient has chronic diarrhea w/ ~3 bowel movements daily, however, recently she has had significantly more bowel movements which are more watery in nature. The patient is also describes some associated abdominal pain, nausea w/ no vomiting, lethargy, and weakness. The daughter also describes a sick contact at home (nephew) who has been having nausea, vomiting, and abdominal pain. The patient denies fever, chills, SOB, chest pain, dizziness, lightheadedness, palpitations, or dysuria.  On arrival to the ED, CT abdomen was performed showing significant large bowel dilation, similar to previous admission in 09/2012 when the patient was admitted for ileus.    Past Medical History  Diagnosis Date  . MGUS (monoclonal gammopathy of unknown significance)      Followed by Dr. Jamse Arn at regional [place]  . Anxiety   . Depression   . Diverticulosis of colon   . Osteopenia   . Lumbar spinal stenosis   . Atrophic vaginitis   . History of tobacco abuse     at least 64 pack year. Started about age 7 and continued until about age 33.  Marland Kitchen Dermatitis   . Hip pain      History  . Actinic keratosis   . Dementia   . Adrenal suppression     central, on chronic steroids, followed by Dr. Buddy Duty of endocrinology  . Gluten intolerance     August 2011 endoscopy with biopsy negative for celiac disease. Prior to this patient had elevated TTG IgG of 93 (normal <20). Dr Collene Mares made clinical diagnosis of gluten  intolerance based on diarrhea/symptoms from eating gluten. Needs GF diet.   Daughter tries to control diet.  . Colon cancer     History of bowel  obstruction. on treatment with Colace and MiraLax.  followed by Dr. Collene Mares and Dr. Barkley Bruns  . Shortness of breath     "sometimes; can happen at any time" (10/14/2012)  . Arthritis     "probably" (10/14/2012)  . Renal cyst   . CKD (chronic kidney disease), stage II     [facility] Endo 10/14/2012  . Stress incontinence in female   . Subcapital fracture of left hip 11/13/2012    Dr Mardelle Matte    Past Surgical History  Procedure Laterality Date  . Hemicolectomy       Right-sided  . Ovarian cyst removal    . Cataract extraction w/ intraocular lens  implant, bilateral Bilateral   . Knee arthroscopy Left   . Appendectomy    . Carpal tunnel release Left   . Lumbar laminectomy    . Lumbar fusion    . Replacement total knee Left   . Tonsillectomy  1930's  . Hip arthroplasty Left 11/13/2012    Procedure: ARTHROPLASTY BIPOLAR HIP;  Surgeon: Johnny Bridge, MD;  Location: [place];  Service: Orthopedics;  Laterality: Left;    History reviewed. No pertinent family history. Social History:  reports that she quit smoking about 24 years ago. Her smoking use included Cigarettes. She has a 47 pack-year smoking  history. She has never used smokeless tobacco. She reports that she does not drink alcohol or use illicit drugs.  Allergies:  Allergies  Allergen Reactions  . Oxycodone Hcl Other (See Comments)    Hallucinations-Sundowners syndrome  . Ambien [Zolpidem Tartrate] Other (See Comments)    Hallucinations-Sundowners syndrome  . Morphine And Related Other (See Comments)    Delirium-Sundowners syndrome  . Pantoprazole [Pantoprazole Sodium] Nausea Only    Medications Prior to Admission  Medication Sig Dispense Refill  . acetaminophen (TYLENOL) 325 MG tablet Take 325 mg by mouth daily as needed. For pain      . aspirin 81 MG tablet Take 81 mg by mouth daily.        . calcium citrate-vitamin D (CITRACAL+D) 315-200 MG-UNIT per tablet Take 1 tablet by mouth 2 (two) times daily.  60 tablet  11  . cyanocobalamin (,VITAMIN B-12,) 1000 MCG/ML injection Inject 1 mL (1,000 mcg total) into the muscle every 30 (thirty) days.  1 mL  11  . feeding supplement (ENSURE COMPLETE) LIQD Take 237 mLs by mouth daily. Vanilla.      . furosemide (LASIX) 20 MG tablet Take 20 mg by mouth daily as needed for fluid or edema.      Marland Kitchen HYDROcodone-acetaminophen (NORCO) 5-325 MG per tablet Take 1-2 tablets by mouth at bedtime as needed for moderate pain. MAXIMUM TOTAL ACETAMINOPHEN DOSE IS 4000 MG PER DAY  30 tablet  0  . hydrocortisone (CORTEF) 10 MG tablet Take 5-10 mg by mouth 2 (two) times daily. 10 mg in the a.m. And 5 mg in the afternoon.      . ondansetron (ZOFRAN) 4 MG tablet Take 1 tablet (4 mg total) by mouth daily as needed for nausea or vomiting.  30 tablet  1  . paricalcitol (ZEMPLAR) 1 MCG capsule Take 1 mcg by mouth every Monday, Wednesday, and Friday.      . potassium chloride (K-DUR) 10 MEQ tablet Take 2 tablets (20 mEq total) by mouth 2 (two) times daily.  60 tablet  3  . sertraline (ZOLOFT) 25 MG tablet Take 1 tablet (25 mg total) by mouth every morning.  30 tablet  6  . sertraline (ZOLOFT) 50 MG tablet Take 1 tablet (50 mg total) by mouth at bedtime.  30 tablet  2  . simethicone (MYLICON) 80 MG chewable tablet Chew 160 mg by mouth daily as needed. For flatulence        Results for orders placed during the hospital encounter of 07/21/13 (from the past 48 hour(s))  COMPREHENSIVE METABOLIC PANEL     Status: Abnormal   Collection Time    07/21/13  1:16 PM      Result Value Ref Range   Sodium 143  137 - 147 mEq/L   Potassium 3.7  3.7 - 5.3 mEq/L   Comment: HEMOLYSIS AT THIS LEVEL MAY AFFECT RESULT     SLIGHT HEMOLYSIS   Chloride 106  96 - 112 mEq/L   CO2 16 (*) 19 - 32 mEq/L   Glucose, Bld 94  70 - 99 mg/dL   BUN 39 (*) 6 - 23 mg/dL   Creatinine, Ser 2.14 (*) 0.50  - 1.10 mg/dL   Calcium 7.7 (*) 8.4 - 10.5 mg/dL   Total Protein 6.4  6.0 - 8.3 g/dL   Albumin 2.2 (*) 3.5 - 5.2 g/dL   AST 34  0 - 37 U/L   Comment: HEMOLYSIS AT THIS LEVEL MAY AFFECT RESULT     SLIGHT HEMOLYSIS  ALT 14  0 - 35 U/L   Comment: HEMOLYSIS AT THIS LEVEL MAY AFFECT RESULT     SLIGHT HEMOLYSIS   Alkaline Phosphatase 165 (*) 39 - 117 U/L   Total Bilirubin 0.2 (*) 0.3 - 1.2 mg/dL   GFR calc non Af Amer 20 (*) >90 mL/min   GFR calc Af Amer 23 (*) >90 mL/min   Comment: (NOTE)     The eGFR has been calculated using the CKD EPI equation.     This calculation has not been validated in all clinical situations.     eGFR's persistently <90 mL/min signify possible Chronic Kidney     Disease.  CBC WITH DIFFERENTIAL     Status: None   Collection Time    07/21/13  1:16 PM      Result Value Ref Range   WBC 7.9  4.0 - 10.5 K/uL   RBC 4.65  3.87 - 5.11 MIL/uL   Hemoglobin 14.3  12.0 - 15.0 g/dL   HCT 42.0  36.0 - 46.0 %   MCV 90.3  78.0 - 100.0 fL   MCH 30.8  26.0 - 34.0 pg   MCHC 34.0  30.0 - 36.0 g/dL   RDW 14.6  11.5 - 15.5 %   Platelets 324  150 - 400 K/uL   Neutrophils Relative % 56  43 - 77 %   Neutro Abs 4.4  1.7 - 7.7 K/uL   Lymphocytes Relative 36  12 - 46 %   Lymphs Abs 2.8  0.7 - 4.0 K/uL   Monocytes Relative 6  3 - 12 %   Monocytes Absolute 0.5  0.1 - 1.0 K/uL   Eosinophils Relative 2  0 - 5 %   Eosinophils Absolute 0.1  0.0 - 0.7 K/uL   Basophils Relative 0  0 - 1 %   Basophils Absolute 0.0  0.0 - 0.1 K/uL  LIPASE, BLOOD     Status: None   Collection Time    07/21/13  1:16 PM      Result Value Ref Range   Lipase 28  11 - 59 U/L  OCCULT BLOOD X 1 CARD TO LAB, STOOL     Status: None   Collection Time    07/21/13  5:29 PM      Result Value Ref Range   Fecal Occult Bld NEGATIVE  NEGATIVE  URINALYSIS, ROUTINE W REFLEX MICROSCOPIC     Status: Abnormal   Collection Time    07/21/13  6:50 PM      Result Value Ref Range   Color, Urine YELLOW  YELLOW    APPearance CLEAR  CLEAR   Specific Gravity, Urine 1.023  1.005 - 1.030   pH 5.0  5.0 - 8.0   Glucose, UA NEGATIVE  NEGATIVE mg/dL   Hgb urine dipstick NEGATIVE  NEGATIVE   Bilirubin Urine SMALL (*) NEGATIVE   Ketones, ur NEGATIVE  NEGATIVE mg/dL   Protein, ur NEGATIVE  NEGATIVE mg/dL   Urobilinogen, UA 0.2  0.0 - 1.0 mg/dL   Nitrite NEGATIVE  NEGATIVE   Leukocytes, UA NEGATIVE  NEGATIVE   Comment: MICROSCOPIC NOT DONE ON URINES WITH NEGATIVE PROTEIN, BLOOD, LEUKOCYTES, NITRITE, OR GLUCOSE <1000 mg/dL.   Ct Abdomen Pelvis Wo Contrast  07/21/2013   CLINICAL DATA:  lower abd pain, worse in RLQ, diarrhea, does report hx of appy  EXAM: CT ABDOMEN AND PELVIS WITHOUT CONTRAST  TECHNIQUE: Multidetector CT imaging of the abdomen and pelvis was performed following the standard protocol  without intravenous contrast.  COMPARISON:  DG PELVIS 1-2 VIEWS dated 03/25/2013; CT ABD/PELVIS W CM dated 10/14/2012; CT ABD/PELVIS W CM dated 11/16/2011  FINDINGS: Stable 5 mm and nodule anterior base right lung peripheral scarring base of the left lung lungs otherwise clear.  Non contrasted evaluation of the liver, spleen, adrenals, pancreas is unremarkable. Stable benign-appearing cyst within the right and left kidneys.  When compared to previous study persistent gaseous distention of loops of large bowel are appreciated with the decompression in the distal transverse colon at the level of the splenic flexure. Persistent dilated loops of small bowel are again identified and also appear unchanged. There is no evidence of free air.  There is no evidence of abdominal or pelvic masses, free fluid, no loculated fluid collections.  Stable postsurgical changes are appreciated within the thoracolumbar spine. No aggressive appearing osseous lesions are identified.  IMPRESSION: Continued gaseous distention of the large bowel and dilated loops of small bowel. When compared to the previous study these findings are stable. No definitive  mechanical obstruction is appreciated. These findings raise concern of obstruction secondary to dysmotility. Differential considerations Ogilvie's syndrome.  Stable nodule right lung base likely benign.   Electronically Signed   By: Margaree Mackintosh M.D.   On: 07/21/2013 21:18   Dg Chest Port 1 View  07/22/2013   CLINICAL DATA:  Shortness of breath.  EXAM: PORTABLE CHEST - 1 VIEW  COMPARISON:  Chest x-ray 03/25/2013.  FINDINGS: No consolidative airspace disease. No pleural effusions. No evidence of pulmonary edema. Heart size appears borderline enlarged, but is distorted by patient's rotation to the left which also distorts upper mediastinal contours. Atherosclerosis in the thoracic aorta. Lucency beneath the right hemidiaphragm compatible with colonic interposition between the diaphragm and the liver as demonstrated on numerous prior examinations.  IMPRESSION: 1. No radiographic evidence of acute cardiopulmonary disease. 2. Atherosclerosis.   Electronically Signed   By: Vinnie Langton M.D.   On: 07/22/2013 00:22    ROS  Blood pressure 113/63, pulse 66, temperature 97.3 F (36.3 C), temperature source Oral, resp. rate 18, SpO2 100.00%. Physical Exam   Assessment/Plan Pt arrived on floor alert and orientedx3. Able to make needs known. NG tube in place place on low intermittent suction. Pt care guide handout provided. Bed at its lowest position. Call light within reached. Will continue to monitor.   Mina Marble 07/22/2013, 2:18 AM

## 2013-07-22 NOTE — Progress Notes (Addendum)
Entered in to patients room after PT session.  Patient in chair and NG tube out of patients nose.  Dr. Naaman Plummer informed.  Per Dr. Naaman Plummer it is ok to leave NG tube out and to start patient on clear liquid diet.  Will continue to monitor.  Dirk Dress 07/22/2013

## 2013-07-22 NOTE — Progress Notes (Addendum)
Subjective:  Pt seen and examined in AM. No acute events overnight. Pt reports she feels well with improved weakness. She is hungry and asking for food. She denies abdominal and reports 1 formed BM with normal passage of gas. She has some nausea but no emesis.   Objective: Vital signs in last 24 hours: Filed Vitals:   07/22/13 0130 07/22/13 0208 07/22/13 0500 07/22/13 0625  BP: 93/70 113/63  91/60  Pulse:  66  62  Temp:  97.3 F (36.3 C)  97.5 F (36.4 C)  TempSrc:  Oral  Oral  Resp: 20 18  18   Height:   5\' 5"  (1.651 m)   Weight:   55.974 kg (123 lb 6.4 oz)   SpO2:  100%  100%   Weight change:   Intake/Output Summary (Last 24 hours) at 07/22/13 1100 Last data filed at 07/21/13 1933  Gross per 24 hour  Intake   1000 ml  Output      0 ml  Net   1000 ml   General: ill-appearing elderly female  Cardiovascular: Regular rate and rhythm  Pulmonary/Chest: Air entry equal bilaterally, no wheezes, rales, or rhonchi.  Abdominal: Soft, non-tender, non-distended, overactive bowel sounds (chronic). No masses, organomegaly, or guarding present.  Extremities: No edema.  Neurological: A&O x3.  Skin: Mild ecchymosis on LE's bilaterally. Psychiatric: Normal mood and affect. Speech and behavior is normal. Cognition and memory are normal.     Lab Results: Basic Metabolic Panel:  Recent Labs Lab 07/21/13 1316 07/22/13 0343  NA 143 145  K 3.7 3.0*  CL 106 114*  CO2 16* 17*  GLUCOSE 94 115*  BUN 39* 36*  CREATININE 2.14* 2.13*  CALCIUM 7.7* 7.0*  MG  --  1.3*   Liver Function Tests:  Recent Labs Lab 07/21/13 1316  AST 34  ALT 14  ALKPHOS 165*  BILITOT 0.2*  PROT 6.4  ALBUMIN 2.2*    Recent Labs Lab 07/21/13 1316  LIPASE 28   CBC:  Recent Labs Lab 07/21/13 1316 07/22/13 0343  WBC 7.9 5.8  NEUTROABS 4.4  --   HGB 14.3 11.4*  HCT 42.0 34.1*  MCV 90.3 89.3  PLT 324 226   Cardiac Enzymes:  Recent Labs Lab 07/22/13 0343  TROPONINI <0.30   Thyroid  Function Tests:  Recent Labs Lab 07/22/13 0343  TSH 3.155   Urinalysis:  Recent Labs Lab 07/21/13 1850  COLORURINE YELLOW  LABSPEC 1.023  PHURINE 5.0  GLUCOSEU NEGATIVE  HGBUR NEGATIVE  BILIRUBINUR SMALL*  KETONESUR NEGATIVE  PROTEINUR NEGATIVE  UROBILINOGEN 0.2  NITRITE NEGATIVE  LEUKOCYTESUR NEGATIVE    Micro Results: Recent Results (from the past 240 hour(s))  CLOSTRIDIUM DIFFICILE BY PCR     Status: None   Collection Time    07/21/13  6:50 PM      Result Value Ref Range Status   C difficile by pcr NEGATIVE  NEGATIVE Final   Studies/Results: Ct Abdomen Pelvis Wo Contrast  07/21/2013   CLINICAL DATA:  lower abd pain, worse in RLQ, diarrhea, does report hx of appy  EXAM: CT ABDOMEN AND PELVIS WITHOUT CONTRAST  TECHNIQUE: Multidetector CT imaging of the abdomen and pelvis was performed following the standard protocol without intravenous contrast.  COMPARISON:  DG PELVIS 1-2 VIEWS dated 03/25/2013; CT ABD/PELVIS W CM dated 10/14/2012; CT ABD/PELVIS W CM dated 11/16/2011  FINDINGS: Stable 5 mm and nodule anterior base right lung peripheral scarring base of the left lung lungs otherwise clear.  Non  contrasted evaluation of the liver, spleen, adrenals, pancreas is unremarkable. Stable benign-appearing cyst within the right and left kidneys.  When compared to previous study persistent gaseous distention of loops of large bowel are appreciated with the decompression in the distal transverse colon at the level of the splenic flexure. Persistent dilated loops of small bowel are again identified and also appear unchanged. There is no evidence of free air.  There is no evidence of abdominal or pelvic masses, free fluid, no loculated fluid collections.  Stable postsurgical changes are appreciated within the thoracolumbar spine. No aggressive appearing osseous lesions are identified.  IMPRESSION: Continued gaseous distention of the large bowel and dilated loops of small bowel. When compared to  the previous study these findings are stable. No definitive mechanical obstruction is appreciated. These findings raise concern of obstruction secondary to dysmotility. Differential considerations Ogilvie's syndrome.  Stable nodule right lung base likely benign.   Electronically Signed   By: Margaree Mackintosh M.D.   On: 07/21/2013 21:18   Dg Chest Port 1 View  07/22/2013   CLINICAL DATA:  Shortness of breath.  EXAM: PORTABLE CHEST - 1 VIEW  COMPARISON:  Chest x-ray 03/25/2013.  FINDINGS: No consolidative airspace disease. No pleural effusions. No evidence of pulmonary edema. Heart size appears borderline enlarged, but is distorted by patient's rotation to the left which also distorts upper mediastinal contours. Atherosclerosis in the thoracic aorta. Lucency beneath the right hemidiaphragm compatible with colonic interposition between the diaphragm and the liver as demonstrated on numerous prior examinations.  IMPRESSION: 1. No radiographic evidence of acute cardiopulmonary disease. 2. Atherosclerosis.   Electronically Signed   By: Vinnie Langton M.D.   On: 07/22/2013 00:22   Medications: I have reviewed the patient's current medications. Scheduled Meds: . heparin  5,000 Units Subcutaneous 3 times per day  . hydrocortisone sod succinate (SOLU-CORTEF) inj  25 mg Intravenous Q12H  . sodium chloride  3 mL Intravenous Q12H   Continuous Infusions: . dextrose 5 % and 0.45% NaCl 100 mL/hr at 07/22/13 0213   PRN Meds:.sodium chloride, ondansetron (ZOFRAN) IV, sodium chloride Assessment/Plan:  Recurrent Ileus - currently with normal BM and passage of gas and low NGT output.  Pt presented with worsening acute on chronic diarrhea, weakness, and decreased PO intake found to have imaging consistent with ileus similar to previous admission on 09/2012 that was noted to be stable due to possible dysmotility obstruction. Pt with history of colon cancer s/p right colectomy in 2001 and biopsy negative Celiac disease on  11/2009 with elevated TTG IgG. No evidence of mass on imaging (increased risk of lymphoma in setting of failure to respond to gluten-free diet in Celiac Disease). No recent weight loss or anemia (FOBT negative) to suggest recurrent colon cancer. Lactic acid levels were normal.  -Clamp NGT -Start clear liquid diet, advance as tolerated -D5NS 75 mL/hr x 12 hrs -Abdominal Xray in AM -Gluten-free diet in setting of Celiac's disease -IV phenergan 12.5 mg Q 6 hr PRN nausea (avoid zofran in setting of prolonged QTc) -PT/OT/CM consults  Acute on chronic diarrhea - improved. Pt with chronic diarrhea with 3 BM daily. Etiology most likely malabsorptive diarrhea due to dietary indiscretion in setting of Celiac disease vs colon resection. Pt was FOBT and c. Diff negative on 3/25. No recent change in diet or medications. Pt with recent sick contact with viral gastroenteritis. No leukocytosis or fever to suggest infectious etiology.   -Awaiting stool culture  Non-oliguric Acute Kidney Injury on CKD Stage III-  Slightly improved to 2.13 today. Pt with Cr above baseline of 1.3. UA with no proteinuria, hematuria,or casts. Etiology most likely due to pre-renal azotemia (FeUrea of  2.3%) due to hypovolemia in setting of recent GI loss (diarrhea).    -D5 NS 75 mL/hr x 12 hrs -Avoid nephrotoxins (NSAIDs, ACE, ARB, contrast) -Hold home furosemide 20 mg PRN fluid retention   Adrenal Insufficiency -  Currently with stable blood pressure and Na level. Patient with central adrenal suppression on PO hydrocortisone 10 mg  in AM and 5 mg in PM at home.  -IV Solu-Cortef 25 mg BID  Anion Gap Metabolic Acidosis - Improving AG 14 today. Pt with AG of 21 with HCO3 of 16 on admission not present at baseline. Etiology most likely due to starvation ketoacidosis.  Lactic acid level was normal.  -Continue to monitor  Hypokalemia -  K 3.0 today. Etiology likely due to recent GI loss, NGT suctioning, and hypomagnesemia.  -Replete K and  Mg as necessary -Monitor BMP -Resume home potassium 20 mEq BID once tolerate PO  Hypomagnesemia - Mg 1.3 today. Pt with low magnesium level most likely due to chronic malabsorption.   -Replete Mg as necessary -Monitor BMP   Severe Malnutrition with chronic hypoalbuminemia - Pt chronic low albumin most likely due to chronic protein malnutrition.  -Dietician consult -Encourage adequate nutrition -Ensure Complete BID   Chronic prolonged QT -Pt with prolonged QTc (503) on 3/26 12-lead EKG, chronic since 05/02/11 (>460). -Avoid QT prolonging medications  History of B12 deficiency -Last B12 normal (331) on 08/12/11. Pt at home on weekly B12 IM injection most likely due to malabsorption s/p colectomy.  -Monthly B12 1000 mcg IM injection   Depression and Anxiety - currently with stable mood. Pt at home on sertratline.  -Resume home sertraline 25 mg AM and 50 mg PM daily  Dementia - Currently at baseline mental status -Continue to monitor mental status  Diet: Clear, advance as tolerated DVT Ppx: SQ heparin TID Code: DNR  Dispo: Disposition is deferred at this time, awaiting improvement of current medical problems.  Anticipated discharge in approximately 1-2 day(s).   The patient does have a current PCP Otho Bellows, MD) and does need an [facility] hospital follow-up appointment after discharge.  The patient does not have transportation limitations that hinder transportation to clinic appointments.  .Services Needed at time of discharge: Y = Yes, Blank = No PT:    OT:   RN:   Equipment:   Other:     LOS: 1 day   Juluis Mire, MD 07/22/2013, 11:00 AM

## 2013-07-22 NOTE — H&P (Addendum)
INTERNAL MEDICINE TEACHING SERVICE Attending Admission Note  Date: 07/22/2013  Patient name: Rachel Bridges  Medical record number: [id number]  Date of birth: December 25, 1924    I have seen and evaluated Rachel Bridges and discussed their care with the Residency Team.  80 yr old woman with pmhx significant for dementia, anxiety/depression, CKD, colon CA s/p resection, MGUS, probable celiac disease, presented with worsening watery diarrhea for past few days and decreased PO intake. In the ED, she was found to have evidence of distention of large bowel and dilated loops of small bowel. But, there is noted that when compared to a previous study, stable.  Filed Vitals:   07/22/13 1325  BP: 95/63  Pulse: 64  Temp: 97.5 F (36.4 C)  Resp: 16   GEN: AAOx3, NAD HEENT: EOMI, PERRL, no icterus. CV: S1S2, no m/r/g, RRR PULM: CTA bilat ABD/GI: non-tender, softly distended, +BS, no guarding. LE: 2/4 pulses, no c/c/e. Scattered echymotic regions.  At this time, given presentation, I agree with continued NGT for symptomatic relief. She has hx of chronic diarrhea. Cdiff sent and negative. This afternoon, given improvement, would clamp NGT and start clear liquids if she tolerates. Follow BMP every 12 hours, she needs K and Mg replacement. Watch carefully. Change IVF to D5NS at 75 cc/hr. Her AKI is likely a result of ATN due to volume depletion, expect slow improvement.  Dominic Pea, DO, [facility] 07/22/2013, 1:54 PM

## 2013-07-22 NOTE — Telephone Encounter (Signed)
Pt has been admitted.  CSW will sign off.

## 2013-07-22 NOTE — Evaluation (Signed)
Physical Therapy Evaluation Patient Details Name: Rachel Bridges MRN: [id number] DOB: 26-Jul-1924 Today's Date: 07/22/2013   History of Present Illness  80 y.o. female admitted to [facility] on 07/21/13 with watery diarrhea and poor p.o intake.  She was dx. with ileus and currently has an NGT to intermittent suction.  She is also NPO (on eval 07-22-13).  Pt with significant PMHx of dementia, anxiety/depression, CKD II, colon CA s/p resection, hip fracture, diverticulosis, gluten intolerance, cataract surgery, lumbar surgery x 2, L TKA,and L hip arthroplasty.   Clinical Impression  Pt is generally weak and deconditioned requiring hands on assist to safely get up and into the recliner chair with the RW.  I am unsure of her baseline level of mobility or her available help at d/c.  Notes seem to indicate that she was active with [facility] before admission, so as long as we can get the pt walking and confirm that her daughter can provide the appropriate amount of support at d/c she can d/c home with HHPT when medically ready.   PT to follow acutely for deficits listed below.     Follow Up Recommendations Home health PT;Supervision/Assistance - 24 hour    Equipment Recommendations  None recommended by PT    Recommendations for Other Services   None    Precautions / Restrictions Precautions Precautions: Fall Precaution Comments: h/o falls      Mobility  Bed Mobility Overal bed mobility: Needs Assistance Bed Mobility: Supine to Sit     Supine to sit: Mod assist;HOB elevated     General bed mobility comments: mod assist to support trunk to get to sitting on EOB.    Transfers Overall transfer level: Needs assistance Equipment used: Rolling walker (2 wheeled) Transfers: Sit to/from Stand Sit to Stand: Min assist         General transfer comment: Min assist to support trunk during transitions over weak legs.    Ambulation/Gait             General Gait Details: Pt refused to  ambulate due to fatigue         Balance Overall balance assessment: Needs assistance Sitting-balance support: Feet supported Sitting balance-Leahy Scale: Fair     Standing balance support: Bilateral upper extremity supported Standing balance-Leahy Scale: Poor                       Pertinent Vitals/Pain Unable to get O2 reading on pulse ox.  Pt not on cardiac monitor. No audible DOE with mobility.      Home Living Family/patient expects to be discharged to:: Private residence Living Arrangements: Children (daughter)               Additional Comments: all info per chart, no family present in room to report.     Prior Function           Comments: No family present to report level of independence.       Hand Dominance   Dominant Hand: Right    Extremity/Trunk Assessment   Upper Extremity Assessment: Generalized weakness           Lower Extremity Assessment: Generalized weakness      Cervical / Trunk Assessment: Other exceptions  Communication   Communication: No difficulties  Cognition Arousal/Alertness: Awake/alert Behavior During Therapy: WFL for tasks assessed/performed Overall Cognitive Status: No family/caregiver present to determine baseline cognitive functioning  General Comments General comments (skin integrity, edema, etc.): NGT in nose.            Assessment/Plan    PT Assessment Patient needs continued PT services  PT Diagnosis Difficulty walking;Abnormality of gait;Generalized weakness;Altered mental status   PT Problem List Decreased strength;Decreased activity tolerance;Decreased balance;Decreased mobility;Decreased cognition;Decreased knowledge of use of DME;Pain  PT Treatment Interventions DME instruction;Stair training;Gait training;Functional mobility training;Therapeutic activities;Therapeutic exercise;Balance training;Neuromuscular re-education;Cognitive remediation;Patient/family  education;Modalities   PT Goals (Current goals can be found in the Care Plan section) Acute Rehab PT Goals Patient Stated Goal: to sleep PT Goal Formulation: With patient Time For Goal Achievement: 08/05/13 Potential to Achieve Goals: Good    Frequency Min 3X/week   Barriers to discharge   unsure of support at home at d/c.  Will need 24/7 assist for mobility.      End of Session Equipment Utilized During Treatment: Gait belt Activity Tolerance: Patient limited by fatigue Patient left: in chair;with call bell/phone within reach         Time: 4098-1191 PT Time Calculation (min): 18 min   Charges:   PT Evaluation $Initial PT Evaluation Tier I: 1 Procedure          Fallou Hulbert B. Nicholi Ghuman, PT, DPT [phone]   07/22/2013, 5:50 PM

## 2013-07-22 NOTE — Progress Notes (Signed)
Received pt report from [place], [place].

## 2013-07-22 NOTE — Progress Notes (Signed)
INITIAL NUTRITION ASSESSMENT  DOCUMENTATION CODES Per approved criteria  -Severe malnutrition in the context of chronic illness   INTERVENTION: Further diet advancement per team's discretion to Gluten-Free diet. Recommend Ensure Complete po BID, each supplement provides 350 kcal and 13 grams of protein. RD to continue to follow nutrition care plan.  NUTRITION DIAGNOSIS: Inadequate oral intake related to GI distress as evidenced by limited diet and intake.   Goal: Further diet advancement as tolerated. Intake to meet >90% of estimated nutrition needs.  Monitor:  weight trends, lab trends, I/O's, PO intake, supplement tolerance, diet advancement  Reason for Assessment: Malnutrition Screening Tool  80 y.o. female  Admitting Dx: Ileus  ASSESSMENT: PMHx significant for CKD, dementia, anxiety, depression, colon CA s/p resection, diverticulosis, MGUS, Celiac disease. Admitted with watery diarrhea x 3 days, weakness and decreased po intake x 6 days. Work-up reveals suspected ileus.  Pt has chronic diarrhea with 3 BM's daily. Pt is not always compliant with Gluten-Free diet, per dtr.  NGT placed on admission set to [facility]. C diff is negative.  Currently ordered for Clear Liquid diet with "Lactose and Gluten" restriction. Pt tells me that she is very hungry. Had two sips of broth and then states that her food was taken away.  Nutrition Focused Physical Exam:   Subcutaneous Fat:  Orbital Region: moderate wasting Upper Arm Region: severe wasting  Thoracic and Lumbar Region: severe wasting   Muscle:  Temple Region: severe wasting  Clavicle Bone Region: severe wasting  Clavicle and Acromion Bone Region: severe wasting  Scapular Bone Region: severe wasting  Dorsal Hand: severe wasting  Patellar Region: severe wasting  Anterior Thigh Region: severe wasting  Posterior Calf Region: severe wasting   Edema: not present  Height: Ht Readings from Last 1 Encounters:  07/22/13 5\' 5"   (1.651 m)    Weight: Wt Readings from Last 1 Encounters:  07/22/13 123 lb 6.4 oz (55.974 kg)    Ideal Body Weight: 125 lb  % Ideal Body Weight: 98%  Wt Readings from Last 10 Encounters:  07/22/13 123 lb 6.4 oz (55.974 kg)  06/30/13 124 lb 1.6 oz (56.291 kg)  06/03/13 124 lb 11.2 oz (56.564 kg)  05/20/13 121 lb 12.8 oz (55.248 kg)  04/07/13 122 lb 11.2 oz (55.656 kg)  03/09/13 123 lb 1.6 oz (55.838 kg)  01/28/13 122 lb 14.4 oz (55.747 kg)  12/25/12 123 lb 12.8 oz (56.155 kg)  11/12/12 113 lb (51.256 kg)  11/12/12 113 lb (51.256 kg)    Usual Body Weight: 122 - 123 lb  % Usual Body Weight: 100%  BMI:  Body mass index is 20.53 kg/(m^2). WNL  Estimated Nutritional Needs: Kcal: 1375 - 1500 Protein: 55 - 65 g Fluid: 1.4 - 1.6 liters  Skin: intact  Diet Order: Clear Liquid  EDUCATION NEEDS: -No education needs identified at this time   Intake/Output Summary (Last 24 hours) at 07/22/13 1511 Last data filed at 07/21/13 1933  Gross per 24 hour  Intake   1000 ml  Output      0 ml  Net   1000 ml    Last BM: 3/25 - diarrhea  Labs:   Recent Labs Lab 07/21/13 1316 07/22/13 0343  NA 143 145  K 3.7 3.0*  CL 106 114*  CO2 16* 17*  BUN 39* 36*  CREATININE 2.14* 2.13*  CALCIUM 7.7* 7.0*  MG  --  1.3*  GLUCOSE 94 115*    CBG (last 3)  No results found for this  basename: GLUCAP,  in the last 72 hours  Scheduled Meds: . heparin  5,000 Units Subcutaneous 3 times per day  . hydrocortisone sod succinate (SOLU-CORTEF) inj  25 mg Intravenous Q12H  . magnesium sulfate 1 - 4 g bolus IVPB  2 g Intravenous Once  . sodium chloride  3 mL Intravenous Q12H    Continuous Infusions: . dextrose 5 % and 0.9% NaCl      Past Medical History  Diagnosis Date  . MGUS (monoclonal gammopathy of unknown significance)      Followed by Dr. Jamse Arn at regional [place]  . Anxiety   . Depression   . Diverticulosis of colon   . Osteopenia   . Lumbar spinal stenosis   .  Atrophic vaginitis   . History of tobacco abuse     at least 64 pack year. Started about age 42 and continued until about age 78.  Marland Kitchen Dermatitis   . Hip pain      History  . Actinic keratosis   . Dementia   . Adrenal suppression     central, on chronic steroids, followed by Dr. Buddy Duty of endocrinology  . Gluten intolerance     August 2011 endoscopy with biopsy negative for celiac disease. Prior to this patient had elevated TTG IgG of 93 (normal <20). Dr Collene Mares made clinical diagnosis of gluten intolerance based on diarrhea/symptoms from eating gluten. Needs GF diet.   Daughter tries to control diet.  . Colon cancer     History of bowel  obstruction. on treatment with Colace and MiraLax.  followed by Dr. Collene Mares and Dr. Barkley Bruns  . Shortness of breath     "sometimes; can happen at any time" (10/14/2012)  . Arthritis     "probably" (10/14/2012)  . Renal cyst   . CKD (chronic kidney disease), stage II     Archie Endo 10/14/2012  . Stress incontinence in female   . Subcapital fracture of left hip 11/13/2012    Dr Mardelle Matte    Past Surgical History  Procedure Laterality Date  . Hemicolectomy       Right-sided  . Ovarian cyst removal    . Cataract extraction w/ intraocular lens  implant, bilateral Bilateral   . Knee arthroscopy Left   . Appendectomy    . Carpal tunnel release Left   . Lumbar laminectomy    . Lumbar fusion    . Replacement total knee Left   . Tonsillectomy  1930's  . Hip arthroplasty Left 11/13/2012    Procedure: ARTHROPLASTY BIPOLAR HIP;  Surgeon: Johnny Bridge, MD;  Location: [facility];  Service: Orthopedics;  Laterality: Left;    Inda Coke MS, RD, LDN Inpatient Registered Dietitian Pager: [phone] After-hours pager: [phone]

## 2013-07-23 DIAGNOSIS — K56609 Unspecified intestinal obstruction, unspecified as to partial versus complete obstruction: Secondary | ICD-10-CM

## 2013-07-23 LAB — BASIC METABOLIC PANEL
BUN: 33 mg/dL — AB (ref 6–23)
BUN: 29 mg/dL — AB (ref 6–23)
CHLORIDE: 112 meq/L (ref 96–112)
CO2: 15 mEq/L — ABNORMAL LOW (ref 19–32)
CO2: 17 mEq/L — ABNORMAL LOW (ref 19–32)
Calcium: 7.8 mg/dL — ABNORMAL LOW (ref 8.4–10.5)
Calcium: 7.6 mg/dL — ABNORMAL LOW (ref 8.4–10.5)
Chloride: 114 mEq/L — ABNORMAL HIGH (ref 96–112)
Creatinine, Ser: 1.74 mg/dL — ABNORMAL HIGH (ref 0.50–1.10)
Creatinine, Ser: 1.75 mg/dL — ABNORMAL HIGH (ref 0.50–1.10)
GFR calc Af Amer: 29 mL/min — ABNORMAL LOW (ref >90)
GFR calc Af Amer: 29 mL/min — ABNORMAL LOW (ref >90)
GFR calc non Af Amer: 25 mL/min — ABNORMAL LOW (ref >90)
GFR calc non Af Amer: 25 mL/min — ABNORMAL LOW (ref >90)
GLUCOSE: 95 mg/dL (ref 70–99)
GLUCOSE: 88 mg/dL (ref 70–99)
POTASSIUM: 3.5 meq/L — AB (ref 3.7–5.3)
Potassium: 4 mEq/L (ref 3.7–5.3)
Sodium: 142 mEq/L (ref 137–147)
Sodium: 144 mEq/L (ref 137–147)

## 2013-07-23 LAB — MAGNESIUM: Magnesium: 2.2 mg/dL (ref 1.5–2.5)

## 2013-07-23 MED ORDER — POTASSIUM CHLORIDE CRYS ER 20 MEQ PO TBCR
40.0000 meq | EXTENDED_RELEASE_TABLET | Freq: Once | ORAL | Status: AC
  Administered 2013-07-23: 40 meq via ORAL
  Filled 2013-07-23: qty 2

## 2013-07-23 MED ORDER — HYDROCORTISONE 5 MG PO TABS: 5.0000 mg | ORAL_TABLET | Freq: Two times a day (BID) | ORAL | Status: DC

## 2013-07-23 MED ORDER — HYDROCORTISONE 10 MG PO TABS
10.0000 mg | ORAL_TABLET | Freq: Once | ORAL | Status: AC
  Administered 2013-07-23: 10 mg via ORAL
  Filled 2013-07-23 (×2): qty 1

## 2013-07-23 NOTE — Progress Notes (Signed)
Subjective:  Pt seen and examined in AM. No acute events overnight. Pt reports she feels well and tolerated clear liquids well yesteday. She is hungry and asking for regular diet. She denies abdominal and per nurse had 2 formed BM's (reports at her baseline has 3 BM's a day). She is passing gas normally and denies nausea or emesis.    Objective: Vital signs in last 24 hours: Filed Vitals:   07/22/13 1325 07/22/13 2048 07/23/13 0356 07/23/13 0445  BP: 95/63 104/73  96/62  Pulse: 64 63  64  Temp: 97.5 F (36.4 C) 98.2 F (36.8 C)  98 F (36.7 C)  TempSrc: Oral Oral  Oral  Resp: 16 18  18   Height:      Weight:   53.8 kg (118 lb 9.7 oz)   SpO2:  100%  99%   Weight change: -2.174 kg (-4 lb 12.7 oz)  Intake/Output Summary (Last 24 hours) at 07/23/13 0739 Last data filed at 07/23/13 0255  Gross per 24 hour  Intake 593.75 ml  Output      0 ml  Net 593.75 ml   General: ill-appearing elderly female  Cardiovascular: Regular rate and rhythm  Pulmonary/Chest: Clear to ausculation bilaterally. No wheezes, rales, or rhonchi.  Abdominal: Soft, non-tender, non-distended, overactive bowel sounds (chronic). No masses, organomegaly, or guarding present.  Extremities: No edema.  Neurological: A&O x3.  Skin: Mild ecchymosis on LE's bilaterally. Psychiatric: Normal mood and affect. Speech and behavior is normal. Cognition and memory are normal.     Lab Results: Basic Metabolic Panel:  Recent Labs Lab 07/22/13 1519 07/23/13 0502  NA 142 142  K 3.2* 3.5*  CL 110 112  CO2 16* 15*  GLUCOSE 99 95  BUN 33* 33*  CREATININE 1.95* 1.74*  CALCIUM 7.4* 7.8*  MG 1.4* 2.2   Liver Function Tests:  Recent Labs Lab 07/21/13 1316  AST 34  ALT 14  ALKPHOS 165*  BILITOT 0.2*  PROT 6.4  ALBUMIN 2.2*    Recent Labs Lab 07/21/13 1316  LIPASE 28   CBC:  Recent Labs Lab 07/21/13 1316 07/22/13 0343  WBC 7.9 5.8  NEUTROABS 4.4  --   HGB 14.3 11.4*  HCT 42.0 34.1*  MCV 90.3  89.3  PLT 324 226   Cardiac Enzymes:  Recent Labs Lab 07/22/13 0343 07/22/13 1024 07/22/13 1519  TROPONINI <0.30 <0.30 <0.30   Thyroid Function Tests:  Recent Labs Lab 07/22/13 0343  TSH 3.155   Urinalysis:  Recent Labs Lab 07/21/13 1850  COLORURINE YELLOW  LABSPEC 1.023  PHURINE 5.0  GLUCOSEU NEGATIVE  HGBUR NEGATIVE  BILIRUBINUR SMALL*  KETONESUR NEGATIVE  PROTEINUR NEGATIVE  UROBILINOGEN 0.2  NITRITE NEGATIVE  LEUKOCYTESUR NEGATIVE    Micro Results: Recent Results (from the past 240 hour(s))  URINE CULTURE     Status: None   Collection Time    07/21/13  6:50 PM      Result Value Ref Range Status   Specimen Description URINE, CATHETERIZED   Final   Special Requests NONE   Final   Culture  Setup Time     Final   Value: 07/21/2013 20:35     Performed at [facility] Count     Final   Value: NO GROWTH     Performed at [facility]   Culture     Final   Value: NO GROWTH     Performed at [facility]   Report Status  07/22/2013 FINAL   Final  CLOSTRIDIUM DIFFICILE BY PCR     Status: None   Collection Time    07/21/13  6:50 PM      Result Value Ref Range Status   C difficile by pcr NEGATIVE  NEGATIVE Final   Studies/Results: Ct Abdomen Pelvis Wo Contrast  07/21/2013   CLINICAL DATA:  lower abd pain, worse in RLQ, diarrhea, does report hx of appy  EXAM: CT ABDOMEN AND PELVIS WITHOUT CONTRAST  TECHNIQUE: Multidetector CT imaging of the abdomen and pelvis was performed following the standard protocol without intravenous contrast.  COMPARISON:  DG PELVIS 1-2 VIEWS dated 03/25/2013; CT ABD/PELVIS W CM dated 10/14/2012; CT ABD/PELVIS W CM dated 11/16/2011  FINDINGS: Stable 5 mm and nodule anterior base right lung peripheral scarring base of the left lung lungs otherwise clear.  Non contrasted evaluation of the liver, spleen, adrenals, pancreas is unremarkable. Stable benign-appearing cyst within the right and left kidneys.  When  compared to previous study persistent gaseous distention of loops of large bowel are appreciated with the decompression in the distal transverse colon at the level of the splenic flexure. Persistent dilated loops of small bowel are again identified and also appear unchanged. There is no evidence of free air.  There is no evidence of abdominal or pelvic masses, free fluid, no loculated fluid collections.  Stable postsurgical changes are appreciated within the thoracolumbar spine. No aggressive appearing osseous lesions are identified.  IMPRESSION: Continued gaseous distention of the large bowel and dilated loops of small bowel. When compared to the previous study these findings are stable. No definitive mechanical obstruction is appreciated. These findings raise concern of obstruction secondary to dysmotility. Differential considerations Ogilvie's syndrome.  Stable nodule right lung base likely benign.   Electronically Signed   By: Margaree Mackintosh M.D.   On: 07/21/2013 21:18   Dg Chest Port 1 View  07/22/2013   CLINICAL DATA:  Shortness of breath.  EXAM: PORTABLE CHEST - 1 VIEW  COMPARISON:  Chest x-ray 03/25/2013.  FINDINGS: No consolidative airspace disease. No pleural effusions. No evidence of pulmonary edema. Heart size appears borderline enlarged, but is distorted by patient's rotation to the left which also distorts upper mediastinal contours. Atherosclerosis in the thoracic aorta. Lucency beneath the right hemidiaphragm compatible with colonic interposition between the diaphragm and the liver as demonstrated on numerous prior examinations.  IMPRESSION: 1. No radiographic evidence of acute cardiopulmonary disease. 2. Atherosclerosis.   Electronically Signed   By: Vinnie Langton M.D.   On: 07/22/2013 00:22   Medications: I have reviewed the patient's current medications. Scheduled Meds: . heparin  5,000 Units Subcutaneous 3 times per day  . hydrocortisone sod succinate (SOLU-CORTEF) inj  25 mg  Intravenous Q12H  . potassium chloride SA  40 mEq Oral Once  . sertraline  25 mg Oral q morning - 10a  . sertraline  50 mg Oral QHS  . sodium chloride  3 mL Intravenous Q12H   Continuous Infusions:   PRN Meds:.sodium chloride, promethazine, sodium chloride Assessment/Plan:  Recurrent Ileus - Resolved.  Currently with no abdominal pain, normal BM, passage of gas and tolerating PO inake.  Pt presented with worsening acute on chronic diarrhea, weakness, and decreased PO intake found to have imaging consistent with ileus similar to previous admission on 09/2012 that was noted to be stable due to possible dysmotility obstruction. Pt with history of colon cancer s/p right colectomy in 2001 and biopsy negative Celiac disease on 11/2009 with elevated TTG  IgG. No evidence of mass on imaging (increased risk of lymphoma in setting of failure to respond to gluten-free diet in Celiac Disease). No recent weight loss or anemia (FOBT negative) to suggest recurrent colon cancer. Lactic acid levels were normal.  -Advance to soft diet today --> tolerated well -D5NS 75 mL/hr x 12 hrs--> completed -Gluten-free diet in setting of Celiac's disease -IV phenergan 12.5 mg Q 6 hr PRN nausea (avoid zofran in setting of prolonged QTc) -PT/OT/CM consults --> home health PT, OT, RN, NA  Non-oliguric Acute Kidney Injury on CKD Stage III-  Improving 1.74 today. Pt with Cr on admission of 2.14, above baseline of 1.3. UA with no proteinuria, hematuria, or casts. Etiology most likely due to pre-renal azotemia (FeUrea of  2.3%) due to hypovolemia in setting of recent GI loss (diarrhea).    -D5 NS 75 mL/hr x 12 hrs --> completed  -Avoid nephrotoxins (NSAIDs, ACE, ARB, contrast) -Hold home furosemide 20 mg PRN fluid retention, resume on discharge as needed for fluid retention -Urine culture---> NG (final) -Pt scheduled for hospital follow-up appt on 3/30 to reasses renal function  Acute on chronic diarrhea - improved. Pt with  chronic diarrhea with 3 BM daily. Etiology most likely malabsorptive diarrhea due to dietary indiscretion in setting of Celiac disease vs colon resection. Pt was FOBT and c. Diff negative on 3/25. No recent change in diet or medications. Pt with recent sick contact with viral gastroenteritis. No leukocytosis or fever to suggest infectious etiology.   -Awaiting stool culture --> NGTD (not final)  Adrenal Insufficiency -  Currently with stable blood pressure and Na level. Patient with central adrenal suppression on PO hydrocortisone 10 mg in AM and 5 mg in PM at home. Pt received IV Solu-Cortef 25 mg BID on admission.  -Resume home hydrocortisone 10 mg today (and regular AM/PM regimen starting tomm)  Anion Gap Metabolic Acidosis - Improving AG 13 today. Pt with AG of 21 with HCO3 of 16 on admission not present at baseline. Etiology most likely due to starvation ketoacidosis.  Lactic acid level was normal.  -Continue to monitor  Severe Malnutrition with chronic hypoalbuminemia - Pt chronic low albumin most likely due to chronic protein malnutrition.  -Dietician consult -Encourage adequate nutrition -Ensure Complete BID   Chronic prolonged QT -Pt with prolonged QTc (503) on 3/26 12-lead EKG, chronic since 05/02/11 (>460). -Avoid QT prolonging medications  History of B12 deficiency -Last B12 normal (331) on 08/12/11. Pt at home on weekly B12 IM injection most likely due to malabsorption s/p colectomy.  -Monthly B12 1000 mcg IM injection   Depression and Anxiety - currently with stable mood. Pt at home on sertratline.  -Resume home sertraline 25 mg AM and 50 mg PM daily (due for 2200 dose tonight)  Dementia - Currently at baseline mental status -Continue to monitor mental status  Hypokalemia -  Resolved.  Etiology likely due to recent GI loss, NGT suctioning, and hypomagnesemia.  -Replete K and Mg as necessary -Monitor BMP -Resume home potassium 20 mEq BID on discharge  Hypomagnesemia -  Resolved.  Pt with low magnesium level most likely due to chronic malabsorption.   -Replete Mg as necessary -Monitor BMP   Diet: Soft  DVT Ppx: SQ heparin TID Code: DNR  Dispo: Disposition is deferred at this time, awaiting improvement of current medical problems.  Anticipated discharge in approximately 1-2 day(s).   The patient does have a current PCP Otho Bellows, MD) and does need an [facility] hospital  follow-up appointment after discharge.  The patient does not have transportation limitations that hinder transportation to clinic appointments.  .Services Needed at time of discharge: Y = Yes, Blank = No PT:    OT:   RN:   Equipment:   Other:     LOS: 2 days   Juluis Mire, MD 07/23/2013, 7:39 AM

## 2013-07-23 NOTE — Progress Notes (Signed)
  Date: 07/23/2013  Patient name: Rachel Bridges  Medical record number: [id number]  Date of birth: 1924/09/02   This patient has been seen and the plan of care was discussed with the house staff. Please see their note for complete details. I concur with their findings with the following additions/corrections: Clinically improved today. Creatinine is nearing baseline and she is eager to eat. Agree with no further need for NGT as reported flatus and improved abdominal exam. Discuss with daughter plans for D/C today if she tolerates advancing diet. I expect renal function to return to baseline with increased PO intake. Appreciate PT recs.  Dominic Pea, DO, [facility] 07/23/2013, 2:47 PM

## 2013-07-23 NOTE — Discharge Instructions (Signed)
-  Follow a gluten-free diet -Resume your cortef tomorrow in the morning  -Take lasix only as needed for fluid retention  -Take your night time dose of zoloft tonight at 10:00PM -Pleasure meeting you - hope you were satisfied with our care!

## 2013-07-23 NOTE — Evaluation (Signed)
Occupational Therapy Evaluation Patient Details Name: Rachel Bridges MRN: [id number] DOB: January 20, 1925 Today's Date: 07/23/2013    History of Present Illness 80 y.o. female admitted to [facility] on 07/21/13 with watery diarrhea and poor p.o intake.  She was dx. with ileus and currently has an NGT to intermittent suction.  She is also NPO (on eval 07-22-13).  Pt with significant PMHx of dementia, anxiety/depression, CKD II, colon CA s/p resection, hip fracture, diverticulosis, gluten intolerance, cataract surgery, lumbar surgery x 2, L TKA,and L hip arthroplasty.    Clinical Impression   Pt demos decline in function with ADLs and ADL mobility safety with decreased strength, balance and endurance. Pt would benefit from acute OT services to address impairments tand increase level of function and safety. Pt will have 24 hr assist and sup at home after d/c per pt    Follow Up Recommendations  Home health OT;Supervision/Assistance - 24 hour    Equipment Recommendations  None recommended by OT    Recommendations for Other Services       Precautions / Restrictions Precautions Precautions: Fall Precaution Comments: h/o falls Restrictions Weight Bearing Restrictions: No      Mobility Bed Mobility Overal bed mobility: Needs Assistance Bed Mobility: Supine to Sit;Sit to Supine     Supine to sit: HOB elevated     General bed mobility comments: min A with trunk  Transfers Overall transfer level: Needs assistance Equipment used: Rolling walker (2 wheeled) Transfers: Sit to/from Stand Sit to Stand: Min assist         General transfer comment: Min A to support trunk during transitions over weak legs.      Balance Overall balance assessment: Needs assistance Sitting-balance support: Feet supported;Single extremity supported;No upper extremity supported;Feet unsupported Sitting balance-Leahy Scale: Fair     Standing balance support: Bilateral upper extremity supported;During  functional activity Standing balance-Leahy Scale: Poor                      ADL   Grooming: Wash/dry hands;Wash/dry face;Standing   Upper Body Dressing : Supervision/safety;Set up;Standing Lower Body Bathing: Moderate assistance Lower Body Dressing: Moderate assistance Toilet Transfer: Minimal assistance Toileting- Clothing Manipulation and Hygiene: Moderate assistance;Sit to/from stand   Functional mobility during ADLs: Minimal assistance;Rolling walker;Cueing for safety       Vision  wears glasses                   Perception Perception Perception Tested?: No   Praxis Praxis Praxis tested?: Not tested    Pertinent Vitals/Pain No c/o pain     Hand Dominance Right   Extremity/Trunk Assessment Upper Extremity Assessment Upper Extremity Assessment: Generalized weakness   Lower Extremity Assessment Lower Extremity Assessment: Defer to PT evaluation   Cervical / Trunk Assessment Cervical / Trunk Assessment: Other exceptions Cervical / Trunk Exceptions: h/o lumbar surgeries x 2   Communication Communication Communication: No difficulties   Cognition Arousal/Alertness: Awake/alert Behavior During Therapy: WFL for tasks assessed/performed Overall Cognitive Status: No family/caregiver present to determine baseline cognitive functioning                     General Comments   Pt pleasant and cooperative          Home Living Family/patient expects to be discharged to:: Private residence Living Arrangements: Children Available Help at Discharge: Family;Available 24 hours/day Type of Home: House Home Access: Stairs to enter [employer] of Steps: 3 Entrance Stairs-Rails: Right Home Layout:  One level     Bathroom Shower/Tub: Tub/shower unit;Curtain   Biochemist, clinical: Standard     Home Equipment: Environmental consultant - 2 wheels;Cane - single point;Bedside commode;Shower seat;Hospital bed   Additional Comments: all info per pt report       Prior Functioning/Environment Level of Independence: Needs assistance  Gait / Transfers Assistance Needed: uses RW at home per pt, dtr walks with her to the bathroom ADL's / Homemaking Assistance Needed: pt reports that her dtr assists her with bathing and LB dressing   Comments: No family present to report level of independence.      OT Diagnosis:  Generalized weakness   OT Problem List: Impaired balance (sitting and/or standing);Decreased activity tolerance;Decreased strength   OT Treatment/Interventions: Self-care/ADL training;Therapeutic exercise;Patient/family education;Neuromuscular education;Balance training;Therapeutic activities;DME and/or AE instruction    OT Goals(Current goals can be found in the care plan section) Acute Rehab OT Goals Patient Stated Goal: go back home with my family OT Goal Formulation: With patient Time For Goal Achievement: 07/30/13 Potential to Achieve Goals: Good ADL Goals Pt Will Perform Grooming: with set-up;with supervision;standing Pt Will Perform Upper Body Bathing: with set-up;sitting Pt Will Perform Lower Body Bathing: with min assist;sitting/lateral leans;sit to/from stand;with caregiver independent in assisting Pt Will Perform Upper Body Dressing: with set-up Pt Will Perform Lower Body Dressing: with min assist;with caregiver independent in assisting;with adaptive equipment;sitting/lateral leans;sit to/from stand Pt Will Transfer to Toilet: with min guard assist;with supervision;ambulating;regular height toilet;grab bars Pt Will Perform Toileting - Clothing Manipulation and hygiene: with min assist;sitting/lateral leans;sit to/from stand;with caregiver independent in assisting  OT Frequency: Min 2X/week   Barriers to D/C:  none          End of Session: Equipment Utilized During Treatment: Gait belt;Rolling walker  Activity Tolerance: Patient tolerated treatment well Patient left: in bed;with call bell/phone within reach   Time:  1026-1048 OT Time Calculation (min): 22 min Charges:  OT General Charges $OT Visit: 1 Procedure OT Evaluation $Initial OT Evaluation Tier I: 1 Procedure OT Treatments $Therapeutic Activity: 8-22 mins G-Codes:    Rachel Bridges 07/23/2013, 1:06 PM

## 2013-07-25 LAB — STOOL CULTURE

## 2013-07-25 NOTE — Discharge Summary (Signed)
Name: Rachel Bridges MRN: [id number] DOB: Jul 15, 1924 80 y.o. PCP: Otho Bellows, MD  Date of Admission: 07/21/2013  4:53 PM Date of Discharge: 07/23/2013 Attending Physician: Dominic Pea, DO  Discharge Diagnosis:  Primary:  Recurrent Ileus due to chronic dysmotility   Non-oliguric Acute Kidney Injury on CKD Stage II Acute on chronic diarrhea  Hypokalemia  Hypomagnesemia Normocytic Anemia Adrenal Insufficiency   Anion Gap Metabolic Acidosis  Severe Malnutrition with chronic hypoalbuminemia Chronic prolonged QT History of B12 deficiency Vitamin D Deficiency  Depression and Anxiety  Dementia  DVT Prophylaxis     Discharge Medications:   Medication List         acetaminophen 325 MG tablet  Commonly known as:  TYLENOL  Take 325 mg by mouth daily as needed. For pain     aspirin 81 MG tablet  Take 81 mg by mouth daily.     calcium citrate-vitamin D 315-200 MG-UNIT per tablet  Commonly known as:  CITRACAL+D  Take 1 tablet by mouth 2 (two) times daily.     cyanocobalamin 1000 MCG/ML injection  Commonly known as:  (VITAMIN B-12)  Inject 1 mL (1,000 mcg total) into the muscle every 30 (thirty) days.     feeding supplement (ENSURE COMPLETE) Liqd  Take 237 mLs by mouth daily. Vanilla.     furosemide 20 MG tablet  Commonly known as:  LASIX  Take 20 mg by mouth daily as needed for fluid or edema.     HYDROcodone-acetaminophen 5-325 MG per tablet  Commonly known as:  NORCO  Take 1-2 tablets by mouth at bedtime as needed for moderate pain. MAXIMUM TOTAL ACETAMINOPHEN DOSE IS 4000 MG PER DAY     hydrocortisone 10 MG tablet  Commonly known as:  CORTEF  Take 5-10 mg by mouth 2 (two) times daily. 10 mg in the a.m. And 5 mg in the afternoon.     ondansetron 4 MG tablet  Commonly known as:  ZOFRAN  Take 1 tablet (4 mg total) by mouth daily as needed for nausea or vomiting.     paricalcitol 1 MCG capsule  Commonly known as:  ZEMPLAR  Take 1 mcg by mouth  every Monday, Wednesday, and Friday.     potassium chloride 10 MEQ tablet  Commonly known as:  K-DUR  Take 2 tablets (20 mEq total) by mouth 2 (two) times daily.     sertraline 25 MG tablet  Commonly known as:  ZOLOFT  Take 1 tablet (25 mg total) by mouth every morning.     sertraline 50 MG tablet  Commonly known as:  ZOLOFT  Take 1 tablet (50 mg total) by mouth at bedtime.     simethicone 80 MG chewable tablet  Commonly known as:  MYLICON  Chew 0000000 mg by mouth daily as needed. For flatulence        Disposition and follow-up:   Ms.Denaja Bridges Rachel was discharged from [facility] in Good condition.  At the hospital follow up visit please address:  1.  Resolution of diarrhea, compliance with gluten-free diet       GI follow-up (h/o colon cancer s/p resection) - last colonoscopy 08/2007 (?) due now for 5 yr follow-up with Dr. Collene Mares. Also was to be referred to GI motility specialist at [facility] due to chronic dysmotility       Consider work-up for small bowel bacterial overgrowth        Resolution of AKI (discharge Cr 1.75, baseline 1.3)  Blood pressure - if stable               Resolution of normocytic anemia (h/o of colon cancer)                Vitamin B12 deficiency - last B12 on 08/12/11  - consider rechecking levels         Last endocrinology appt with Dr. Buddy Duty  01/2012 (?) - was  to f/u in 2-3 months for adrenal insufficiency      2.  Labs / imaging needed at time of follow-up: BMP (K, Cr), CBC (H/H), B12 level   3.  Pending labs/ test needing follow-up: None  Follow-up Appointments:     Follow-up Information   Follow up with [facility]. (for RN, physical therapy, occupational therapy, aide)    Contact information:   [address] [place] Rachel Bridges [id number] [phone]       Follow up with Rachel Groves, DO On 07/27/2013. (3:15 PM)    Specialty:  Internal Medicine   Contact information:   [address] [place] [address] [phone]       Discharge Instructions:  -Follow a gluten-free diet -Resume your cortef tomorrow in the morning  -Take lasix only as needed for fluid retention  -Take your night time dose of zoloft tonight at 10:00PM -Pleasure meeting you - hope you were satisfied with our care!     Discharge Orders   Future Appointments Provider Department Dept Phone   07/27/2013 3:15 PM Joni Reining, [facility] [place] [phone]   09/02/2013 3:15 PM Otho Bellows, MD [place] [phone]   05/20/2014 10:00 AM [facility] [place] [facility] [phone]   05/20/2014 10:30 AM [facility] Covering Provider [place] [facility] [phone]   Future Orders Complete By Expires   Increase activity slowly  As directed    Increase activity slowly  As directed       Consultations: None  Procedures Performed:  Ct Abdomen Pelvis Wo Contrast  07/21/2013   CLINICAL DATA:  lower abd pain, worse in RLQ, diarrhea, does report hx of appy  EXAM: CT ABDOMEN AND PELVIS WITHOUT CONTRAST  TECHNIQUE: Multidetector CT imaging of the abdomen and pelvis was performed following the standard protocol without intravenous contrast.  COMPARISON:  DG PELVIS 1-2 VIEWS dated 03/25/2013; CT ABD/PELVIS W CM dated 10/14/2012; CT ABD/PELVIS W CM dated 11/16/2011  FINDINGS: Stable 5 mm and nodule anterior base right lung peripheral scarring base of the left lung lungs otherwise clear.  Non contrasted evaluation of the liver, spleen, adrenals, pancreas is unremarkable. Stable benign-appearing cyst within the right and left kidneys.  When compared to previous study persistent gaseous distention of loops of large bowel are appreciated with the decompression in the distal transverse colon at the level of the splenic flexure. Persistent dilated loops of small bowel are again identified and also appear unchanged.  There is no evidence of free air.  There is no evidence of abdominal or pelvic masses, free fluid, no loculated fluid collections.  Stable postsurgical changes are appreciated within the thoracolumbar spine. No aggressive appearing osseous lesions are identified.  IMPRESSION: Continued gaseous distention of the large bowel and dilated loops of small bowel. When compared to the previous study these findings are stable. No definitive mechanical obstruction is appreciated. These findings raise concern of obstruction secondary to dysmotility. Differential considerations Ogilvie's syndrome.  Stable nodule right lung base likely benign.   Electronically Signed   By: Margaree Mackintosh M.D.   On: 07/21/2013 21:18   Dg Chest Port 1 View  07/22/2013   CLINICAL DATA:  Shortness of breath.  EXAM: PORTABLE CHEST - 1 VIEW  COMPARISON:  Chest x-ray 03/25/2013.  FINDINGS: No consolidative airspace disease. No pleural effusions. No evidence of pulmonary edema. Heart size appears borderline enlarged, but is distorted by patient's rotation to the left which also distorts upper mediastinal contours. Atherosclerosis in the thoracic aorta. Lucency beneath the right hemidiaphragm compatible with colonic interposition between the diaphragm and the liver as demonstrated on numerous prior examinations.  IMPRESSION: 1. No radiographic evidence of acute cardiopulmonary disease. 2. Atherosclerosis.   Electronically Signed   By: Vinnie Langton M.D.   On: 07/22/2013 00:22    2D Echo: none  Cardiac Cath: none  Admission HPI: Original Author Natasha Bence, MD  Ms. Montana Bryngelson is a 80 y.o. female w/ PMHx of CKD, dementia, anxiety, depression, h/o Colon CA s/p resection in 2001, diverticulosis, MGUS, and ?Celiac Disease, presents to the ED w/ family w/ complaints of worsening watery diarrhea for 3 days, weakness, and decreased po intake for 6 days. According to the daughter, the patient had 6 episodes of watery brown diarrhea  yesterday and likely more today. The patient has chronic diarrhea w/ ~3 bowel movements daily, however, recently she has had significantly more bowel movements which are more watery in nature. The patient is also describes some associated abdominal pain, nausea w/ no vomiting, lethargy, and weakness. The daughter also describes a sick contact at home (nephew) who has been having nausea, vomiting, and abdominal pain. The patient denies fever, chills, SOB, chest pain, dizziness, lightheadedness, palpitations, or dysuria.  On arrival to the ED, CT abdomen was performed showing significant large bowel dilation, similar to previous admission in 09/2012 when the patient was admitted for ileus.    Hospital Course by problem list:   Recurrent Ileus due to chronic dysmotility -  Pt with history of questionable SBO secondary to post-surgical adhesions vs ileus due to chronic dysmotility who presented with abdominal pain, worsening acute on chronic diarrhea, weakness, and decreased PO intake found to have imaging consistent with ileus similar to previous admission on 09/2012 that was noted to be stable. Etiology most likely due to chronic dysmotility. Pt with history of colon cancer s/p right colectomy in 2001 and gluten intolerance (biopsy negative Celiac disease on 11/2009 with elevated TTG IgG). Pt with no evidence of mass on imaging, recent weight loss, or active GI bleeding (FOBT negative) to suggest recurrent colon cancer (last colonoscopy was 08/2007). Lactic acid and lipase levels were normal. Pt had NG tube placed with suction for 1 day for decompression and received IV fluids due to hypovolemia on admission. Pt had improved abdominal pain with normal passage of gas and stool with advancement of diet that was tolerated well. Pt received gluten-free diet and family confirmed she had been following one at home and will continue to do so. Pt was on 09/2009 admission suggested for referral to GI motility specialist at  [facility] for chronic dysmotility (it is unclear if she was referred by her gastroenterologist (Dr. Collene Mares) or not). Pt to have close hospital follow-up to ensure resolution of symptoms and needs to have 5 year follow-up colonoscopy as soon as possible.    Non-oliguric Acute Kidney Injury on CKD Stage III-  Pt presented with normal urine output with  Cr on admission of 2.14, above baseline of 1.3. UA with no proteinuria, hematuria, or casts. Pt did not have urinary symptoms and urine culture revealed no growth. Etiology most likely due to pre-renal azotemia (FeUrea of 2.3%) due to hypovolemia in setting of recent GI loss (diarrhea) and decreased fluid intake. Pt received IV fluids with improvement of renal function to Cr of  1.74 on day of discharge. Nephrotoxins were held during hospitalization. Pt instructed to use home furosemide 20 mg only as needed for fluid retention. Pt to have close hospital follow-up to recheck BMP to ensure return of renal function to baseline.     Acute on chronic diarrhea - Pt with chronic diarrhea with >3 BM daily. Etiology most likely motility disorder (s/p bowel resection) vs malabsorptive diarrhea due to gluten intolerance. Small bowel bacterial overgrowth also possible. Pt was FOBT and c. diff negative. Pt with recent sick contact with viral gastroenteritis, however no leukocytosis or fever to suggest infectious etiology. Stool culture revealed no growth of salmonella, shigella, campylobacter, yersinia, or E. Coli 0157:H7. Pt's diarrhea resolved during hospitalization with return of her pattern of BM's.   Hypokalemia - Pt with low potassium levels (3.0-3.5) during hospitalization that normalized with repletion. Etiology likely due to recent GI loss, NGT suctioning, hypomagnesemia, and chronic malabsorption (gluten intolerance).  Pt was instructed to resume home potassium 20 mEq BID on discharge. Pt to have close hospital follow-up for recheck of BMP to ensure levels  remain normal.   Hypomagnesemia - Pt with low magnesium level (1.3-1.4) during hospitalization most likely due to chronic malabsorption (gluten intolerance) that normalized with replacement.  Normocytic Anemia - Pt with stable Hg near baseline of 12 during hospitalization with no active bleeding or hemodynamic instability.  Last anemia panel on 03/03/11 revealed low iron (22), low TIBC (236), low iron saturation (9), normal ferritin (67), normal folate (15.3), low B12 (190, later improved to 331 on 08/12/11). Etiology most likely multifactorial due to ACD (CKD) vs iron malabsorption. Pt to have close hospital follow-up to recheck CBC to ensure resolution of anemia.   Adrenal Insufficiency - Pt with blood pressure range of 83/60-113/63 with normal sodium level during hospitalization. Patient with central adrenal suppression on chronic corticosteroids,  PO hydrocortisone 10 mg in AM and 5 mg in PM. Pt received IV Solu-Cortef 25 mg BID during hospitalization and then resumed on home hydrocortisone 10 mg on day of discharge and instructed to resume normal regmen on following day of discharge. Pt is followed by Dr. Buddy Duty of endocrinology.    Anion Gap Metabolic Acidosis - Pt with AG of 21 on admission that improved to 13 on day of discharge. Etiology most likely due to starvation ketoacidosis. Lactic acid levels were normal.    Severe Malnutrition with chronic hypoalbuminemia - Pt chronic low albumin most likely due to chronic protein malnutrition. Registered dietician recommended nutrition supplements (Ensure Complete BID) and pt to continue nutrition feeding supplements at home.     Chronic prolonged QT - Pt with prolonged QTc (503) on 3/26 12-lead EKG that has been chronic since 05/02/11 (>460). QT prolonging medications were avoided during hospitalization.    History of B12 deficiency -Last B12 normal (331) on 08/12/11. Pt at home on weekly B12 IM injection most likely due to malabsorption s/p right  colectomy. Pt to continue monthly B12 1000 mcg IM injection and have close hospital follow-up for B12 level to determine status of deficiency.    Vitamin D Deficiency  - Pt with last  vitamin 1,25 [place] level of 18.3 on 01/01/12. Etiology most likely due to malabsorption (gluten intolerance). Corrected calcium was normal on admission. Pt at home on calcium-citrate-Vitamin D BID as well as paricalcitol 1 mcg (MWF) which pt was instructed to resume on discharge. Pt follows with Dr. Buddy Duty of endocrinology.   Depression and Anxiety - Pt with stable mood during hospitalization. Pt was at home on sertraline 25 mg AM and 50 mg PM daily which were resumed once pt was able to tolerate PO intake. Pt was instructed to continue medications on discharge.   Dementia - Pt was at her baseline mental status during hospitalization per family with no changes in her status during hospitalization.     DVT Prophylaxis - Pt received SCD's as tolerated uring hospitalization with no thrombosis.      Discharge Vitals:   BP 96/59  Pulse 64  Temp(Src) 97.6 F (36.4 C) (Oral)  Resp 18  Ht 5\' 5"  (1.651 m)  Wt 53.8 kg (118 lb 9.7 oz)  BMI 19.74 kg/m2  SpO2 99%  Discharge Labs:  No results found for this or any previous visit (from the past 24 hour(s)).  Signed: Juluis Mire, MD 07/25/2013, 5:48 PM   Time Spent on Discharge: 40 minutes Services Ordered on Discharge: home health PT/OT/RN/NA Equipment Ordered on Discharge: None

## 2013-07-26 NOTE — Discharge Summary (Signed)
  Date: 07/26/2013  Patient name: Rachel Bridges  Medical record number: [id number]  Date of birth: 06-25-1924   This patient has been seen and the plan of care was discussed with the house staff. Please see their note for complete details. I concur with their findings and plan.  Dominic Pea, DO, [facility] 07/26/2013, 2:31 PM

## 2013-07-27 ENCOUNTER — Encounter: Payer: Self-pay | Admitting: Internal Medicine

## 2013-07-27 ENCOUNTER — Ambulatory Visit (INDEPENDENT_AMBULATORY_CARE_PROVIDER_SITE_OTHER): Payer: Medicare Other | Admitting: Internal Medicine

## 2013-07-27 VITALS — BP 89/63 | HR 93 | Temp 86.0°F | Ht 66.0 in | Wt 120.0 lb

## 2013-07-27 DIAGNOSIS — R197 Diarrhea, unspecified: Secondary | ICD-10-CM

## 2013-07-27 DIAGNOSIS — N183 Chronic kidney disease, stage 3 unspecified: Secondary | ICD-10-CM

## 2013-07-27 DIAGNOSIS — E2749 Other adrenocortical insufficiency: Secondary | ICD-10-CM

## 2013-07-27 DIAGNOSIS — D649 Anemia, unspecified: Secondary | ICD-10-CM

## 2013-07-27 DIAGNOSIS — R627 Adult failure to thrive: Secondary | ICD-10-CM

## 2013-07-27 DIAGNOSIS — E43 Unspecified severe protein-calorie malnutrition: Secondary | ICD-10-CM

## 2013-07-27 DIAGNOSIS — E2740 Unspecified adrenocortical insufficiency: Secondary | ICD-10-CM

## 2013-07-27 DIAGNOSIS — K529 Noninfective gastroenteritis and colitis, unspecified: Secondary | ICD-10-CM

## 2013-07-27 DIAGNOSIS — E41 Nutritional marasmus: Secondary | ICD-10-CM

## 2013-07-27 LAB — BASIC METABOLIC PANEL WITH GFR
BUN: 23 mg/dL (ref 6–23)
CHLORIDE: 113 meq/L — AB (ref 96–112)
CO2: 18 meq/L — AB (ref 19–32)
CREATININE: 1.5 mg/dL — AB (ref 0.50–1.10)
Calcium: 7.7 mg/dL — ABNORMAL LOW (ref 8.4–10.5)
GFR, Est African American: 36 mL/min — ABNORMAL LOW
GFR, Est Non African American: 31 mL/min — ABNORMAL LOW
Glucose, Bld: 98 mg/dL (ref 70–99)
Potassium: 3.4 mEq/L — ABNORMAL LOW (ref 3.5–5.3)
Sodium: 142 mEq/L (ref 135–145)

## 2013-07-27 LAB — CBC
HEMATOCRIT: 40.4 % (ref 36.0–46.0)
Hemoglobin: 13.5 g/dL (ref 12.0–15.0)
MCH: 29.5 pg (ref 26.0–34.0)
MCHC: 33.4 g/dL (ref 30.0–36.0)
MCV: 88.2 fL (ref 78.0–100.0)
Platelets: 302 10*3/uL (ref 150–400)
RBC: 4.58 MIL/uL (ref 3.87–5.11)
RDW: 15 % (ref 11.5–15.5)
WBC: 10.1 10*3/uL (ref 4.0–10.5)

## 2013-07-27 NOTE — Assessment & Plan Note (Signed)
Continue Ensure supplements BID Gluten free diet.

## 2013-07-27 NOTE — Assessment & Plan Note (Signed)
Patient with acute on CKD in the hospital, creatine was trending down.  -Repeat BMP today. -Continue to hold lasix for next week then can be PRN.

## 2013-07-27 NOTE — Assessment & Plan Note (Signed)
Patient has numerous chronic medical issues and has continued to decline after her right hip fracture last year.  She does have home health services ordered at home but I think she will benefit from acute rehab.  I will ask our social worker to see if we may be able to have her placed from home, if not see what other services we may be able to provide to reduce the likelihood of readmission.

## 2013-07-27 NOTE — Assessment & Plan Note (Signed)
Patient BP mildly low today but roughly consistent with previous BP readings.  Patient has not been back to see Dr. Buddy Duty in some time. Daughter reports she will call today for follow up appointment.

## 2013-07-27 NOTE — Assessment & Plan Note (Signed)
Repeat CBC today. Patient will need to have appointment with Dr. Collene Mares for follow up colonoscopy once acute issues resolve.

## 2013-07-27 NOTE — Progress Notes (Signed)
[facility] Subjective:   Patient ID: Rachel Bridges female   DOB: 31-Jul-1924 80 y.o.   MRN: [id number]  HPI: Rachel Bridges is a 80 y.o. female with a PMH significant for dementia, CKD stage 3, MGUS, Colon CA, Osteopenia, left hip fracture in July 2014, gluten intolerance, recurrent SBO, GERD, adrenal insufficiency, and protein malnutrition who presents w/ Bridges for HFU. She was recently hospitalized from 3/25 to 3/27 for recurrent illeus. Rachel Bridges reports that since getting home she hasn't been eating well, she spends most of the day laying around and sleeping.  She is not able to move from on room to another without significant assistance.  Home health PT has come by to work with her but told the Bridges that she will likely need more can than 1 hour twice a week.  They do note that her diarrhea is about the same as when she came to the hospital//maybe slightly improved.  The Bridges is willing to bring Rachel Bridges home and continue to try to take care of her but is concerned that she may need a short stay in a rehab facility to continue her recovery, neither want her to stay in SNF for extended period.  If she does go they would request [facility].     Past Medical History  Diagnosis Date  . MGUS (monoclonal gammopathy of unknown significance)      Followed by Dr. Jamse Arn at regional [place]  . Anxiety   . Depression   . Diverticulosis of colon   . Osteopenia   . Lumbar spinal stenosis   . Atrophic vaginitis   . History of tobacco abuse     at least 64 pack year. Started about age 24 and continued until about age 19.  Marland Kitchen Dermatitis   . Hip pain      History  . Actinic keratosis   . Dementia   . Adrenal suppression     central, on chronic steroids, followed by Dr. Buddy Duty of endocrinology  . Gluten intolerance     August 2011 endoscopy with biopsy negative for celiac disease. Prior to this patient had elevated TTG IgG of 93  (normal <20). Dr Collene Mares made clinical diagnosis of gluten intolerance based on diarrhea/symptoms from eating gluten. Needs GF diet.   Bridges tries to control diet.  . Colon cancer     History of bowel  obstruction. on treatment with Colace and MiraLax.  followed by Dr. Collene Mares and Dr. Barkley Bruns  . Shortness of breath     "sometimes; can happen at any time" (10/14/2012)  . Arthritis     "probably" (10/14/2012)  . Renal cyst   . CKD (chronic kidney disease), stage II     Archie Endo 10/14/2012  . Stress incontinence in female   . Subcapital fracture of left hip 11/13/2012    Dr Mardelle Matte   Current Outpatient Prescriptions  Medication Sig Dispense Refill  . acetaminophen (TYLENOL) 325 MG tablet Take 325 mg by mouth daily as needed. For pain      . aspirin 81 MG tablet Take 81 mg by mouth daily.       . calcium citrate-vitamin D (CITRACAL+D) 315-200 MG-UNIT per tablet Take 1 tablet by mouth 2 (two) times daily.  60 tablet  11  . cyanocobalamin (,VITAMIN B-12,) 1000 MCG/ML injection Inject 1 mL (1,000 mcg total) into the muscle every 30 (thirty) days.  1 mL  11  . feeding supplement (ENSURE COMPLETE) LIQD Take  237 mLs by mouth daily. Vanilla.      . furosemide (LASIX) 20 MG tablet Take 20 mg by mouth daily as needed for fluid or edema.      Marland Kitchen HYDROcodone-acetaminophen (NORCO) 5-325 MG per tablet Take 1-2 tablets by mouth at bedtime as needed for moderate pain. MAXIMUM TOTAL ACETAMINOPHEN DOSE IS 4000 MG PER DAY  30 tablet  0  . hydrocortisone (CORTEF) 10 MG tablet Take 5-10 mg by mouth 2 (two) times daily. 10 mg in the a.m. And 5 mg in the afternoon.      . ondansetron (ZOFRAN) 4 MG tablet Take 1 tablet (4 mg total) by mouth daily as needed for nausea or vomiting.  30 tablet  1  . paricalcitol (ZEMPLAR) 1 MCG capsule Take 1 mcg by mouth every Monday, Wednesday, and Friday.      . potassium chloride (K-DUR) 10 MEQ tablet Take 2 tablets (20 mEq total) by mouth 2 (two) times daily.  60 tablet  3  . sertraline  (ZOLOFT) 25 MG tablet Take 1 tablet (25 mg total) by mouth every morning.  30 tablet  6  . sertraline (ZOLOFT) 50 MG tablet Take 1 tablet (50 mg total) by mouth at bedtime.  30 tablet  2  . simethicone (MYLICON) 80 MG chewable tablet Chew 160 mg by mouth daily as needed. For flatulence       No current facility-administered medications for this visit.   No family history on file. History   Social History  . Marital Status: Widowed    Spouse Name: N/A    Number of Children: N/A  . Years of Education: N/A   Social History Main Topics  . Smoking status: Former Smoker -- 1.00 packs/day for 47 years    Types: Cigarettes    Quit date: 08/01/1988  . Smokeless tobacco: Never Used  . Alcohol Use: No  . Drug Use: No  . Sexual Activity: None   Other Topics Concern  . None   Social History Narrative    Husband died of myeloma.  Patient lives with Bridges and son in [place], with [facility] services.   Review of Systems: Review of Systems  Constitutional: Positive for malaise/fatigue. Negative for fever and chills.  Respiratory: Negative for shortness of breath.   Cardiovascular: Negative for chest pain.  Gastrointestinal: Positive for diarrhea. Negative for vomiting and abdominal pain.  Genitourinary: Negative for dysuria and frequency.  Neurological: Positive for weakness. Negative for dizziness.     Objective:  Physical Exam: Filed Vitals:   07/27/13 1541  BP: 89/63  Pulse: 93  Temp: 86 F (30 C)  TempSrc: Axillary  Height: 5\' 6"  (1.676 m)  Weight: 120 lb (54.432 kg)  SpO2: 100%  Physical Exam  Nursing note and vitals reviewed. Constitutional:  Frail elderly female in wheelchair, in no acute distress  HENT:  Head: Normocephalic and atraumatic.  Eyes: EOM are normal. Pupils are equal, round, and reactive to light.  Cardiovascular: Normal rate and regular rhythm.   Pulmonary/Chest: Effort normal and breath sounds normal.  Abdominal: Soft. Bowel sounds are normal.  There is no tenderness.  Musculoskeletal: She exhibits no edema.    Assessment & Plan:  Patient seen by and case discussed with Dr. Stann Mainland See Problem Based Assessment and Plan Medications Ordered No orders of the defined types were placed in this encounter.   Other Orders Orders Placed This Encounter  Procedures  . BMP with Estimated GFR ([id number])  . CBC no Diff

## 2013-07-27 NOTE — Patient Instructions (Signed)
Please continue you current medications (continue to hold lasix for now).   Call and make an appointment with Dr. Buddy Duty. Our social worker Golden Hurter will contact you tomorrow morning.  Please bring your medicines with you each time you come.   Medicines may be  Eye drops  Herbal   Vitamins  Pills  Seeing these help [place] take care of you.

## 2013-07-27 NOTE — Progress Notes (Signed)
Case discussed with Dr. Hoffman at the time of the visit.  We reviewed the resident's history and exam and pertinent patient test results.  I agree with the assessment, diagnosis, and plan of care documented in the resident's note. 

## 2013-07-27 NOTE — Assessment & Plan Note (Signed)
Patient encouraged to continue gluten free diet.  Will focus on trying to get help with acute rehab.  Patient will need to follow up with Dr. Collene Mares vs GI motility specalist at [facility] in future.

## 2013-07-28 ENCOUNTER — Ambulatory Visit: Payer: Medicare Other | Admitting: Internal Medicine

## 2013-07-28 ENCOUNTER — Inpatient Hospital Stay (HOSPITAL_COMMUNITY): Payer: Medicare Other

## 2013-07-28 ENCOUNTER — Encounter (HOSPITAL_COMMUNITY): Payer: Self-pay | Admitting: Emergency Medicine

## 2013-07-28 ENCOUNTER — Other Ambulatory Visit: Payer: Self-pay

## 2013-07-28 ENCOUNTER — Inpatient Hospital Stay (HOSPITAL_COMMUNITY)
Admission: EM | Admit: 2013-07-28 | Discharge: 2013-07-31 | DRG: 682 | Disposition: A | Discharge status: Skilled Nursing Facility | Payer: Medicare Other | Attending: Internal Medicine | Admitting: Internal Medicine

## 2013-07-28 ENCOUNTER — Telehealth: Payer: Self-pay | Admitting: Licensed Clinical Social Worker

## 2013-07-28 DIAGNOSIS — I959 Hypotension, unspecified: Secondary | ICD-10-CM | POA: Diagnosis present

## 2013-07-28 DIAGNOSIS — N183 Chronic kidney disease, stage 3 unspecified: Secondary | ICD-10-CM | POA: Diagnosis present

## 2013-07-28 DIAGNOSIS — E872 Acidosis, unspecified: Secondary | ICD-10-CM | POA: Diagnosis present

## 2013-07-28 DIAGNOSIS — Z515 Encounter for palliative care: Secondary | ICD-10-CM

## 2013-07-28 DIAGNOSIS — N393 Stress incontinence (female) (male): Secondary | ICD-10-CM | POA: Diagnosis present

## 2013-07-28 DIAGNOSIS — E46 Unspecified protein-calorie malnutrition: Secondary | ICD-10-CM

## 2013-07-28 DIAGNOSIS — K560 Paralytic ileus: Secondary | ICD-10-CM | POA: Diagnosis present

## 2013-07-28 DIAGNOSIS — Z66 Do not resuscitate: Secondary | ICD-10-CM | POA: Diagnosis present

## 2013-07-28 DIAGNOSIS — K900 Celiac disease: Secondary | ICD-10-CM | POA: Diagnosis present

## 2013-07-28 DIAGNOSIS — E43 Unspecified severe protein-calorie malnutrition: Secondary | ICD-10-CM

## 2013-07-28 DIAGNOSIS — Z79899 Other long term (current) drug therapy: Secondary | ICD-10-CM

## 2013-07-28 DIAGNOSIS — K219 Gastro-esophageal reflux disease without esophagitis: Secondary | ICD-10-CM | POA: Diagnosis present

## 2013-07-28 DIAGNOSIS — C189 Malignant neoplasm of colon, unspecified: Secondary | ICD-10-CM | POA: Diagnosis present

## 2013-07-28 DIAGNOSIS — E876 Hypokalemia: Secondary | ICD-10-CM | POA: Diagnosis present

## 2013-07-28 DIAGNOSIS — N184 Chronic kidney disease, stage 4 (severe): Secondary | ICD-10-CM | POA: Diagnosis present

## 2013-07-28 DIAGNOSIS — D472 Monoclonal gammopathy: Secondary | ICD-10-CM

## 2013-07-28 DIAGNOSIS — E2740 Unspecified adrenocortical insufficiency: Secondary | ICD-10-CM | POA: Diagnosis present

## 2013-07-28 DIAGNOSIS — Z7982 Long term (current) use of aspirin: Secondary | ICD-10-CM

## 2013-07-28 DIAGNOSIS — F0390 Unspecified dementia without behavioral disturbance: Secondary | ICD-10-CM | POA: Diagnosis present

## 2013-07-28 DIAGNOSIS — N179 Acute kidney failure, unspecified: Principal | ICD-10-CM | POA: Diagnosis present

## 2013-07-28 DIAGNOSIS — R531 Weakness: Secondary | ICD-10-CM

## 2013-07-28 DIAGNOSIS — R627 Adult failure to thrive: Secondary | ICD-10-CM | POA: Diagnosis present

## 2013-07-28 DIAGNOSIS — R197 Diarrhea, unspecified: Secondary | ICD-10-CM | POA: Diagnosis not present

## 2013-07-28 DIAGNOSIS — Z87891 Personal history of nicotine dependence: Secondary | ICD-10-CM

## 2013-07-28 DIAGNOSIS — IMO0002 Reserved for concepts with insufficient information to code with codable children: Secondary | ICD-10-CM

## 2013-07-28 DIAGNOSIS — E2749 Other adrenocortical insufficiency: Secondary | ICD-10-CM | POA: Diagnosis present

## 2013-07-28 DIAGNOSIS — Z85038 Personal history of other malignant neoplasm of large intestine: Secondary | ICD-10-CM

## 2013-07-28 DIAGNOSIS — E860 Dehydration: Secondary | ICD-10-CM

## 2013-07-28 DIAGNOSIS — R110 Nausea: Secondary | ICD-10-CM | POA: Diagnosis present

## 2013-07-28 DIAGNOSIS — Z96649 Presence of unspecified artificial hip joint: Secondary | ICD-10-CM

## 2013-07-28 HISTORY — DX: Chronic kidney disease, stage 4 (severe): N18.4

## 2013-07-28 HISTORY — DX: Personal history of peptic ulcer disease: Z87.11

## 2013-07-28 HISTORY — DX: Anemia, unspecified: D64.9

## 2013-07-28 HISTORY — DX: Personal history of other diseases of the digestive system: Z87.19

## 2013-07-28 HISTORY — DX: Personal history of other endocrine, nutritional and metabolic disease: Z86.39

## 2013-07-28 HISTORY — DX: Personal history of other medical treatment: Z92.89

## 2013-07-28 LAB — CBC WITH DIFFERENTIAL/PLATELET
Basophils Absolute: 0 10*3/uL (ref 0.0–0.1)
Basophils Relative: 0 % (ref 0–1)
EOS ABS: 0.1 10*3/uL (ref 0.0–0.7)
Eosinophils Relative: 0 % (ref 0–5)
HCT: 36.9 % (ref 36.0–46.0)
HEMOGLOBIN: 12.1 g/dL (ref 12.0–15.0)
LYMPHS ABS: 1.3 10*3/uL (ref 0.7–4.0)
Lymphocytes Relative: 10 % — ABNORMAL LOW (ref 12–46)
MCH: 30.2 pg (ref 26.0–34.0)
MCHC: 32.8 g/dL (ref 30.0–36.0)
MCV: 92 fL (ref 78.0–100.0)
MONOS PCT: 4 % (ref 3–12)
Monocytes Absolute: 0.5 10*3/uL (ref 0.1–1.0)
NEUTROS ABS: 10.7 10*3/uL — AB (ref 1.7–7.7)
Neutrophils Relative %: 86 % — ABNORMAL HIGH (ref 43–77)
PLATELETS: 218 10*3/uL (ref 150–400)
RBC: 4.01 MIL/uL (ref 3.87–5.11)
RDW: 15.3 % (ref 11.5–15.5)
WBC: 12.6 10*3/uL — AB (ref 4.0–10.5)

## 2013-07-28 LAB — COMPREHENSIVE METABOLIC PANEL
ALK PHOS: 116 U/L (ref 39–117)
ALT: 14 U/L (ref 0–35)
AST: 17 U/L (ref 0–37)
Albumin: 1.9 g/dL — ABNORMAL LOW (ref 3.5–5.2)
BUN: 28 mg/dL — AB (ref 6–23)
CHLORIDE: 114 meq/L — AB (ref 96–112)
CO2: 16 meq/L — AB (ref 19–32)
Calcium: 7.7 mg/dL — ABNORMAL LOW (ref 8.4–10.5)
Creatinine, Ser: 1.77 mg/dL — ABNORMAL HIGH (ref 0.50–1.10)
GFR calc Af Amer: 29 mL/min — ABNORMAL LOW (ref >90)
GFR, EST NON AFRICAN AMERICAN: 25 mL/min — AB (ref >90)
GLUCOSE: 99 mg/dL (ref 70–99)
POTASSIUM: 4.2 meq/L (ref 3.7–5.3)
SODIUM: 145 meq/L (ref 137–147)
TOTAL PROTEIN: 5.5 g/dL — AB (ref 6.0–8.3)
Total Bilirubin: 0.2 mg/dL — ABNORMAL LOW (ref 0.3–1.2)

## 2013-07-28 LAB — URINALYSIS, ROUTINE W REFLEX MICROSCOPIC
Glucose, UA: NEGATIVE mg/dL
Hgb urine dipstick: NEGATIVE
Ketones, ur: NEGATIVE mg/dL
Leukocytes, UA: NEGATIVE
NITRITE: NEGATIVE
PH: 5.5 (ref 5.0–8.0)
Protein, ur: 30 mg/dL — AB
Specific Gravity, Urine: 1.022 (ref 1.005–1.030)
Urobilinogen, UA: 0.2 mg/dL (ref 0.0–1.0)

## 2013-07-28 LAB — LACTIC ACID, PLASMA: Lactic Acid, Venous: 1.9 mmol/L (ref 0.5–2.2)

## 2013-07-28 LAB — URINE MICROSCOPIC-ADD ON

## 2013-07-28 LAB — GLUCOSE, CAPILLARY: Glucose-Capillary: 76 mg/dL (ref 70–99)

## 2013-07-28 LAB — I-STAT CG4 LACTIC ACID, ED: Lactic Acid, Venous: 3.84 mmol/L — ABNORMAL HIGH (ref 0.5–2.2)

## 2013-07-28 LAB — TROPONIN I: Troponin I: <0.3 ng/mL (ref <0.30)

## 2013-07-28 MED ORDER — HYDROCODONE-ACETAMINOPHEN 5-325 MG PO TABS: 1.0000 | ORAL_TABLET | Freq: Every evening | ORAL | Status: DC | PRN

## 2013-07-28 MED ORDER — SERTRALINE HCL 25 MG PO TABS
25.0000 mg | ORAL_TABLET | Freq: Every morning | ORAL | Status: DC
  Administered 2013-07-29 – 2013-07-31 (×3): 25 mg via ORAL
  Filled 2013-07-28 (×3): qty 1

## 2013-07-28 MED ORDER — HYDROCORTISONE 5 MG PO TABS
5.0000 mg | ORAL_TABLET | Freq: Every evening | ORAL | Status: DC
  Filled 2013-07-28: qty 1

## 2013-07-28 MED ORDER — HYDROCORTISONE 10 MG PO TABS: 10.0000 mg | ORAL_TABLET | Freq: Every morning | ORAL | Status: DC

## 2013-07-28 MED ORDER — HYDROCORTISONE 10 MG PO TABS
10.0000 mg | ORAL_TABLET | Freq: Every evening | ORAL | Status: DC
  Administered 2013-07-28 – 2013-07-31 (×4): 10 mg via ORAL
  Filled 2013-07-28 (×5): qty 1

## 2013-07-28 MED ORDER — SODIUM CHLORIDE 0.9 % IV BOLUS (SEPSIS)
1000.0000 mL | Freq: Once | INTRAVENOUS | Status: AC
  Administered 2013-07-28: 1000 mL via INTRAVENOUS

## 2013-07-28 MED ORDER — HEPARIN SODIUM (PORCINE) 5000 UNIT/ML IJ SOLN
5000.0000 [IU] | Freq: Three times a day (TID) | INTRAMUSCULAR | Status: DC
  Administered 2013-07-28 – 2013-07-31 (×8): 5000 [IU] via SUBCUTANEOUS
  Filled 2013-07-28 (×12): qty 1

## 2013-07-28 MED ORDER — PARICALCITOL 1 MCG PO CAPS
1.0000 ug | ORAL_CAPSULE | ORAL | Status: DC
  Administered 2013-07-28 – 2013-07-30 (×2): 1 ug via ORAL
  Filled 2013-07-28 (×2): qty 1

## 2013-07-28 MED ORDER — SODIUM CHLORIDE 0.9 % IV SOLN
INTRAVENOUS | Status: DC
Start: 2013-07-28 — End: 2013-07-28

## 2013-07-28 MED ORDER — CALCIUM CITRATE-VITAMIN D 500-400 MG-UNIT PO CHEW
1.0000 | CHEWABLE_TABLET | Freq: Two times a day (BID) | ORAL | Status: DC
  Filled 2013-07-28: qty 1

## 2013-07-28 MED ORDER — ASPIRIN EC 81 MG PO TBEC
81.0000 mg | DELAYED_RELEASE_TABLET | Freq: Every day | ORAL | Status: DC
  Administered 2013-07-28 – 2013-07-31 (×4): 81 mg via ORAL
  Filled 2013-07-28 (×4): qty 1

## 2013-07-28 MED ORDER — PROMETHAZINE HCL 25 MG PO TABS: 12.5000 mg | ORAL_TABLET | Freq: Four times a day (QID) | ORAL | Status: DC | PRN

## 2013-07-28 MED ORDER — SERTRALINE HCL 50 MG PO TABS
50.0000 mg | ORAL_TABLET | Freq: Every day | ORAL | Status: DC
  Administered 2013-07-28 – 2013-07-31 (×3): 50 mg via ORAL
  Filled 2013-07-28 (×4): qty 1

## 2013-07-28 MED ORDER — HYDROCORTISONE 20 MG PO TABS
20.0000 mg | ORAL_TABLET | Freq: Every morning | ORAL | Status: DC
  Administered 2013-07-29 – 2013-07-31 (×3): 20 mg via ORAL
  Filled 2013-07-28 (×3): qty 1

## 2013-07-28 MED ORDER — ACETAMINOPHEN 325 MG PO TABS: 325.0000 mg | ORAL_TABLET | Freq: Four times a day (QID) | ORAL | Status: DC | PRN

## 2013-07-28 MED ORDER — ENSURE COMPLETE PO LIQD
237.0000 mL | Freq: Two times a day (BID) | ORAL | Status: DC
  Administered 2013-07-29 – 2013-07-31 (×4): 237 mL via ORAL

## 2013-07-28 MED ORDER — DEXTROSE-NACL 5-0.45 % IV SOLN
INTRAVENOUS | Status: DC
  Administered 2013-07-28 – 2013-07-30 (×4): via INTRAVENOUS

## 2013-07-28 MED ORDER — CALCIUM CARBONATE-VITAMIN D 500-200 MG-UNIT PO TABS
1.0000 | ORAL_TABLET | Freq: Two times a day (BID) | ORAL | Status: DC
  Administered 2013-07-28 – 2013-07-31 (×6): 1 via ORAL
  Filled 2013-07-28 (×8): qty 1

## 2013-07-28 MED ORDER — SODIUM CHLORIDE 0.9 % IV SOLN: INTRAVENOUS | Status: DC

## 2013-07-28 MED ORDER — KCL IN DEXTROSE-NACL 20-5-0.45 MEQ/L-%-% IV SOLN
INTRAVENOUS | Status: DC
  Administered 2013-07-28: 18:00:00 via INTRAVENOUS
  Filled 2013-07-28 (×2): qty 1000

## 2013-07-28 MED ORDER — ASPIRIN 81 MG PO TABS: 81.0000 mg | ORAL_TABLET | Freq: Every day | ORAL | Status: DC

## 2013-07-28 MED ORDER — CALCIUM CITRATE-VITAMIN D 315-200 MG-UNIT PO TABS: 1.0000 | ORAL_TABLET | Freq: Two times a day (BID) | ORAL | Status: DC

## 2013-07-28 NOTE — Progress Notes (Signed)
Thank you for consulting the Palliative Medicine Team at [facility] to meet your patient's and family's needs.   The reason that you asked [place] to see your patient is  For [place]  We have scheduled your patient for a meeting: 4/2 at 1 pm  The Surrogate decision make is: Daughter Horton Chin Contact information: [phone]  Other family members that need to be present:  At her discretion    Your patient is able/unable to participate: plus/minus  Additional Narrative: chronic ileus.  Seen by pmt early 2014  Kiani Wurtzel L. Lovena Le, MD MBA The Palliative Medicine Team at [facility] Phone: [phone] Pager: [phone]

## 2013-07-28 NOTE — Progress Notes (Signed)
Admission note:   Arrival Method: Via stretcher from ED Mental Status: A&Ox4 Telemetry: N/A  Skin: Intact. Blanchable redness on sacrum/buttocks and left hip. Prophylactic sacral foam dressing applied.  Tubes: N/A IV: RFA 07/28/13 IVF infusing. Pain: Denies  Family: Daughter at bedside Living Situation: From home Safety Measures: Bed in lowest position. Call bell within reach. 6E Orientation: Oriented to unit and surroundings. Daughter at bedside.  Aaron Edelman, [facility] [place]

## 2013-07-28 NOTE — ED Provider Notes (Signed)
CSN: [id number]     Arrival date & time 07/28/13  1106 History   First MD Initiated Contact with Patient 07/28/13 1110     Chief Complaint  Patient presents with  . Weakness   Level V caveat for dementia  (Consider location/radiation/quality/duration/timing/severity/associated sxs/prior Treatment) HPI patient lives with her daughter who is with the patient in the ED. She states last week the patient had diarrhea, loss of appetite and was dehydrated. She was admitted one week ago and was discharged 2 days later when she was able to drink liquids in the hospital. Daughter states that she has gone home she is not doing well. She is not eating at all. She is not able to ambulate with her walker which she was able to ambulate before she got ill. She states the patient is too weak to get up. She also is not talking to the family. Daughter states the diarrhea is gone however patient has continued nausea. She does not have fever, no diarrhea now, no complaints of pain to her family.  Patient was seen by her PCP yesterday and they discussed putting her into rehabilitation for strengthening.   On EMS arrival today her blood pressure was 90/50 and they gave her 250 cc of fluid which improved her blood pressure to 111/62.   PCP Dr Eulas Post, [facility]  Past Medical History  Diagnosis Date  . MGUS (monoclonal gammopathy of unknown significance)      Followed by Dr. Jamse Arn at regional [place]  . Anxiety   . Depression   . Diverticulosis of colon   . Osteopenia   . Lumbar spinal stenosis   . Atrophic vaginitis   . History of tobacco abuse     at least 64 pack year. Started about age 49 and continued until about age 38.  Marland Kitchen Dermatitis   . Hip pain      History  . Actinic keratosis   . Dementia   . Adrenal suppression     central, on chronic steroids, followed by Dr. Buddy Duty of endocrinology  . Gluten intolerance     August 2011 endoscopy with biopsy negative for celiac disease. Prior to this patient had  elevated TTG IgG of 93 (normal <20). Dr Collene Mares made clinical diagnosis of gluten intolerance based on diarrhea/symptoms from eating gluten. Needs GF diet.   Daughter tries to control diet.  . Colon cancer     History of bowel  obstruction. on treatment with Colace and MiraLax.  followed by Dr. Collene Mares and Dr. Barkley Bruns  . Shortness of breath     "sometimes; can happen at any time" (10/14/2012)  . Arthritis     "probably" (10/14/2012)  . Renal cyst   . CKD (chronic kidney disease), stage II     Archie Endo 10/14/2012  . Stress incontinence in female   . Subcapital fracture of left hip 11/13/2012    Dr Mardelle Matte   Past Surgical History  Procedure Laterality Date  . Hemicolectomy       Right-sided  . Ovarian cyst removal    . Cataract extraction w/ intraocular lens  implant, bilateral Bilateral   . Knee arthroscopy Left   . Appendectomy    . Carpal tunnel release Left   . Lumbar laminectomy    . Lumbar fusion    . Replacement total knee Left   . Tonsillectomy  1930's  . Hip arthroplasty Left 11/13/2012    Procedure: ARTHROPLASTY BIPOLAR HIP;  Surgeon: Johnny Bridge, MD;  Location: [facility];  Service: Orthopedics;  Laterality: Left;   No family history on file. History  Substance Use Topics  . Smoking status: Former Smoker -- 1.00 packs/day for 47 years    Types: Cigarettes    Quit date: 08/01/1988  . Smokeless tobacco: Never Used  . Alcohol Use: No   Uses walker Lives with daughter  OB History   Grav Para Term Preterm Abortions TAB SAB Ect Mult Living                 Review of Systems  Unable to perform ROS: Dementia      Allergies  Oxycodone hcl; Ambien; Morphine and related; and Pantoprazole  Home Medications   Current Outpatient Rx  Name  Route  Sig  Dispense  Refill  . acetaminophen (TYLENOL) 325 MG tablet   Oral   Take 325 mg by mouth daily as needed. For pain         . aspirin 81 MG tablet   Oral   Take 81 mg by mouth daily.          . cyanocobalamin (,VITAMIN  B-12,) 1000 MCG/ML injection   Intramuscular   Inject 1 mL (1,000 mcg total) into the muscle every 30 (thirty) days.   1 mL   11   . feeding supplement (ENSURE COMPLETE) LIQD   Oral   Take 237 mLs by mouth daily. Vanilla.         . furosemide (LASIX) 20 MG tablet   Oral   Take 20 mg by mouth daily as needed for fluid or edema.         Marland Kitchen HYDROcodone-acetaminophen (NORCO) 5-325 MG per tablet   Oral   Take 1-2 tablets by mouth at bedtime as needed for moderate pain. MAXIMUM TOTAL ACETAMINOPHEN DOSE IS 4000 MG PER DAY   30 tablet   0   . hydrocortisone (CORTEF) 10 MG tablet   Oral   Take 5-10 mg by mouth 2 (two) times daily. 10 mg in the a.m. And 5 mg in the afternoon.         . ondansetron (ZOFRAN) 4 MG tablet   Oral   Take 1 tablet (4 mg total) by mouth daily as needed for nausea or vomiting.   30 tablet   1   . paricalcitol (ZEMPLAR) 1 MCG capsule   Oral   Take 1 mcg by mouth every Monday, Wednesday, and Friday.         . potassium chloride (K-DUR) 10 MEQ tablet   Oral   Take 2 tablets (20 mEq total) by mouth 2 (two) times daily.   60 tablet   3   . sertraline (ZOLOFT) 25 MG tablet   Oral   Take 1 tablet (25 mg total) by mouth every morning.   30 tablet   6   . sertraline (ZOLOFT) 50 MG tablet   Oral   Take 1 tablet (50 mg total) by mouth at bedtime.   30 tablet   2   . simethicone (MYLICON) 80 MG chewable tablet   Oral   Chew 160 mg by mouth daily as needed. For flatulence         . EXPIRED: calcium citrate-vitamin D (CITRACAL+D) 315-200 MG-UNIT per tablet   Oral   Take 1 tablet by mouth 2 (two) times daily.   60 tablet   11    BP 105/64  Pulse 70  Temp(Src) 96.9 F (36.1 C) (Rectal)  Resp 20  SpO2 93% Physical Exam  Nursing note and vitals reviewed. Constitutional:  Non-toxic appearance. She does not appear ill. No distress.  Underweight elderly female  HENT:  Head: Normocephalic and atraumatic.  Right Ear: External ear normal.   Left Ear: External ear normal.  Nose: Nose normal. No mucosal edema or rhinorrhea.  Mouth/Throat: Oropharynx is clear and moist and mucous membranes are normal. No dental abscesses or uvula swelling.  Eyes: Conjunctivae and EOM are normal. Pupils are equal, round, and reactive to light.  Neck: Normal range of motion and full passive range of motion without pain. Neck supple.  Cardiovascular: Normal rate, regular rhythm and normal heart sounds.  Exam reveals no gallop and no friction rub.   No murmur heard. Pulmonary/Chest: Effort normal and breath sounds normal. No respiratory distress. She has no wheezes. She has no rhonchi. She has no rales. She exhibits no tenderness and no crepitus.  Abdominal: Soft. Normal appearance and bowel sounds are normal. She exhibits no distension. There is no tenderness. There is no rebound and no guarding.  Musculoskeletal: Normal range of motion. She exhibits edema. She exhibits no tenderness.  Patient is diffusely weak, she is noted to have some edema on the dorsum of her feet most likely from malnutrition.  Neurological: She is alert. She has normal strength. No cranial nerve deficit.  Patient does not speak when talked to, however she does follow simple commands such as opening her eyes are open your mouth.  Skin: Skin is warm, dry and intact. No rash noted. No erythema. There is pallor.  Psychiatric: She has a normal mood and affect. Her speech is normal and behavior is normal. Her mood appears not anxious.    ED Course  Procedures (including critical care time)  Medications  0.9 %  sodium chloride infusion ( Intravenous New Bag/Given 07/28/13 1225)  sodium chloride 0.9 % bolus 1,000 mL (0 mLs Intravenous Stopped 07/28/13 1225)    Patient given 1 L of IV fluids after her arrival to the ED because nursing staff noted that her blood pressure dropped again into the 90s. Daughter reports she talked to their PCP today and he suggested they come to the ED for  admission. Daughter is agreeable with admission.  1335 Dr Eula Fried, [facility], admit to med-surg, attending Dr Ara Kussmaul Review Results for orders placed during the hospital encounter of 07/28/13  CBC WITH DIFFERENTIAL      Result Value Ref Range   WBC 12.6 (*) 4.0 - 10.5 K/uL   RBC 4.01  3.87 - 5.11 MIL/uL   Hemoglobin 12.1  12.0 - 15.0 g/dL   HCT 36.9  36.0 - 46.0 %   MCV 92.0  78.0 - 100.0 fL   MCH 30.2  26.0 - 34.0 pg   MCHC 32.8  30.0 - 36.0 g/dL   RDW 15.3  11.5 - 15.5 %   Platelets 218  150 - 400 K/uL   Neutrophils Relative % 86 (*) 43 - 77 %   Neutro Abs 10.7 (*) 1.7 - 7.7 K/uL   Lymphocytes Relative 10 (*) 12 - 46 %   Lymphs Abs 1.3  0.7 - 4.0 K/uL   Monocytes Relative 4  3 - 12 %   Monocytes Absolute 0.5  0.1 - 1.0 K/uL   Eosinophils Relative 0  0 - 5 %   Eosinophils Absolute 0.1  0.0 - 0.7 K/uL   Basophils Relative 0  0 - 1 %   Basophils Absolute 0.0  0.0 - 0.1 K/uL  COMPREHENSIVE METABOLIC  PANEL      Result Value Ref Range   Sodium 145  137 - 147 mEq/L   Potassium 4.2  3.7 - 5.3 mEq/L   Chloride 114 (*) 96 - 112 mEq/L   CO2 16 (*) 19 - 32 mEq/L   Glucose, Bld 99  70 - 99 mg/dL   BUN 28 (*) 6 - 23 mg/dL   Creatinine, Ser 1.77 (*) 0.50 - 1.10 mg/dL   Calcium 7.7 (*) 8.4 - 10.5 mg/dL   Total Protein 5.5 (*) 6.0 - 8.3 g/dL   Albumin 1.9 (*) 3.5 - 5.2 g/dL   AST 17  0 - 37 U/L   ALT 14  0 - 35 U/L   Alkaline Phosphatase 116  39 - 117 U/L   Total Bilirubin 0.2 (*) 0.3 - 1.2 mg/dL   GFR calc non Af Amer 25 (*) >90 mL/min   GFR calc Af Amer 29 (*) >90 mL/min  URINALYSIS, ROUTINE W REFLEX MICROSCOPIC      Result Value Ref Range   Color, Urine YELLOW  YELLOW   APPearance CLOUDY (*) CLEAR   Specific Gravity, Urine 1.022  1.005 - 1.030   pH 5.5  5.0 - 8.0   Glucose, UA NEGATIVE  NEGATIVE mg/dL   Hgb urine dipstick NEGATIVE  NEGATIVE   Bilirubin Urine SMALL (*) NEGATIVE   Ketones, ur NEGATIVE  NEGATIVE mg/dL   Protein, ur 30 (*) NEGATIVE mg/dL   Urobilinogen, UA  0.2  0.0 - 1.0 mg/dL   Nitrite NEGATIVE  NEGATIVE   Leukocytes, UA NEGATIVE  NEGATIVE  URINE MICROSCOPIC-ADD ON      Result Value Ref Range   Squamous Epithelial / LPF RARE  RARE   WBC, UA 3-6  <3 WBC/hpf   RBC / HPF 0-2  <3 RBC/hpf   Bacteria, UA MANY (*) RARE   Casts HYALINE CASTS (*) NEGATIVE   Laboratory interpretation all normal except elevated chloride consistent with dehydration, low bicarbonate with anion gap of 15, chronic renal insufficiency, concentrated urine   Imaging Review No results found.  Ct Abdomen Pelvis Wo Contrast  07/21/2013   CLINICAL DATA:  lower abd pain, worse in RLQ, diarrhea, does report hx of appy   IMPRESSION: Continued gaseous distention of the large bowel and dilated loops of small bowel. When compared to the previous study these findings are stable. No definitive mechanical obstruction is appreciated. These findings raise concern of obstruction secondary to dysmotility. Differential considerations Ogilvie's syndrome.  Stable nodule right lung base likely benign.   Electronically Signed   By: Margaree Mackintosh M.D.   On: 07/21/2013 21:18   Dg Chest Port 1 View  07/22/2013   CLINICAL DATA:  Shortness of breath. IMPRESSION: 1. No radiographic evidence of acute cardiopulmonary disease. 2. Atherosclerosis.   Electronically Signed   By: Vinnie Langton M.D.   On: 07/22/2013 00:22      EKG Interpretation None      MDM   Final diagnoses:  Weakness  Failure to thrive  Dehydration  Malnutrition    Plan admission  [name], MD, Alanson Aly, MD 07/28/13 1346

## 2013-07-28 NOTE — ED Notes (Signed)
Critical Lactic Acid reported to Admitting and EDP

## 2013-07-28 NOTE — ED Notes (Signed)
Attempted Report 

## 2013-07-28 NOTE — ED Notes (Signed)
80 yo female by [facility] with c/o generalized weakness x 2 weeks. Pt was released 2 weeks ago from [facility] with the same diagnosis. In conjunction with weakness pt. Is experiencing nausea and diarrhea. Pale color and cold to touch. HX is extensive with malnutrition and failure to thrive. Per EMS IV started, pt received 250 ml due to Low BP of 90/50. Currently 111/62 HR 70-80s. Pt is A/O w/ no pain.

## 2013-07-28 NOTE — ED Notes (Signed)
Knapp MD at bedside  

## 2013-07-28 NOTE — H&P (Signed)
Date: 07/28/2013               Patient Name:  Rachel Bridges MRN: [id number]  DOB: [birth year] Age / Sex: 80 y.o., female   PCP: Otho Bellows, MD         Medical Service: Internal Medicine Teaching Service         Attending Physician: Dr. Bartholomew Crews, MD    First Contact: Dr. Joni Reining Pager: [phone]  Second Contact: Dr. Gaspar Bidding Pager: [phone]       After Hours (After 5p/  First Contact Pager: [phone]  weekends / holidays): Second Contact Pager: [phone]   Chief Complaint: nausea, weakness, non ambulatory  History of Present Illness: Rachel Bridges is a 80 y.o. female with a PMH significant for dementia, CKD stage 3, MGUS, Colon CA, Osteopenia, left hip fracture in July 2014, gluten intolerance, recurrent SBO, GERD, adrenal insufficiency, and protein malnutrition.  She was seen in our clinic yesterday for hospital follow up after admission for recurrent illeius.  At that visit she was accompanied by her daughter Rachel Bridges) who reported mild improvement in diarrhea since discharge but continued progressive weakness.  He daughter was called the morning by clinic social worker and attending MD, daughter reported that she has continued to deteriorate and does not think she can care for her, they requested she come in for appointment but daughter reported she could not get her out of bed and needed EMS.  EMS was called and she was brought to the ED for evaluation.  In the ED daughter reported the diarrhea has improved but weakness is worse and patient in now completely non ambulatory.  Rachel Bridges reports that she took only 2 bites of food last night, she did request some water this morning but only took 2 sips of water.  Rachel Bridges reports that she knows that her mother has been declining but she reports she is just not herself. Rachel Bridges reports that after the hip fracture in July they had end of life discussion and Ms Gillyard requested no "heroic" measure be taken, She requested to be  DNR, would not want a long term feeding tube. Ms. Bechtel reports that she does not feel well, she denies chest pain/pressure, SOB, fever, chills, abdominal pain, dysuria, back pain, neck pain, hip pain. Meds: Current Facility-Administered Medications  Medication Dose Route Frequency Provider Last Rate Last Dose  . 0.9 %  sodium chloride infusion   Intravenous Continuous Janice Norrie, MD 125 mL/hr at 07/28/13 1225     Current Outpatient Prescriptions  Medication Sig Dispense Refill  . acetaminophen (TYLENOL) 325 MG tablet Take 325 mg by mouth daily as needed. For pain      . aspirin 81 MG tablet Take 81 mg by mouth daily.       . cyanocobalamin (,VITAMIN B-12,) 1000 MCG/ML injection Inject 1 mL (1,000 mcg total) into the muscle every 30 (thirty) days.  1 mL  11  . feeding supplement (ENSURE COMPLETE) LIQD Take 237 mLs by mouth daily. Vanilla.      . furosemide (LASIX) 20 MG tablet Take 20 mg by mouth daily as needed for fluid or edema.      Marland Kitchen HYDROcodone-acetaminophen (NORCO) 5-325 MG per tablet Take 1-2 tablets by mouth at bedtime as needed for moderate pain. MAXIMUM TOTAL ACETAMINOPHEN DOSE IS 4000 MG PER DAY  30 tablet  0  . hydrocortisone (CORTEF) 10 MG tablet Take 5-10 mg by mouth 2 (two) times daily. 10  mg in the a.m. And 5 mg in the afternoon.      . ondansetron (ZOFRAN) 4 MG tablet Take 1 tablet (4 mg total) by mouth daily as needed for nausea or vomiting.  30 tablet  1  . paricalcitol (ZEMPLAR) 1 MCG capsule Take 1 mcg by mouth every Monday, Wednesday, and Friday.      . potassium chloride (K-DUR) 10 MEQ tablet Take 2 tablets (20 mEq total) by mouth 2 (two) times daily.  60 tablet  3  . sertraline (ZOLOFT) 25 MG tablet Take 1 tablet (25 mg total) by mouth every morning.  30 tablet  6  . sertraline (ZOLOFT) 50 MG tablet Take 1 tablet (50 mg total) by mouth at bedtime.  30 tablet  2  . simethicone (MYLICON) 80 MG chewable tablet Chew 160 mg by mouth daily as needed. For flatulence      .  calcium citrate-vitamin D (CITRACAL+D) 315-200 MG-UNIT per tablet Take 1 tablet by mouth 2 (two) times daily.  60 tablet  11    Allergies: Allergies as of 07/28/2013 - Review Complete 07/28/2013  Allergen Reaction Noted  . Oxycodone hcl Other (See Comments) 03/02/2011  . Ambien [zolpidem tartrate] Other (See Comments) 12/25/2012  . Morphine and related Other (See Comments) 03/02/2011  . Pantoprazole [pantoprazole sodium] Nausea Only 03/02/2011   Past Medical History  Diagnosis Date  . MGUS (monoclonal gammopathy of unknown significance)      Followed by Dr. Jamse Arn at regional [place]  . Anxiety   . Depression   . Diverticulosis of colon   . Osteopenia   . Lumbar spinal stenosis   . Atrophic vaginitis   . History of tobacco abuse     at least 64 pack year. Started about age 41 and continued until about age 6.  Marland Kitchen Dermatitis   . Hip pain      History  . Actinic keratosis   . Dementia   . Adrenal suppression     central, on chronic steroids, followed by Dr. Buddy Duty of endocrinology  . Gluten intolerance     August 2011 endoscopy with biopsy negative for celiac disease. Prior to this patient had elevated TTG IgG of 93 (normal <20). Dr Collene Mares made clinical diagnosis of gluten intolerance based on diarrhea/symptoms from eating gluten. Needs GF diet.   Daughter tries to control diet.  . Colon cancer     History of bowel  obstruction. on treatment with Colace and MiraLax.  followed by Dr. Collene Mares and Dr. Barkley Bruns  . Shortness of breath     "sometimes; can happen at any time" (10/14/2012)  . Arthritis     "probably" (10/14/2012)  . Renal cyst   . CKD (chronic kidney disease), stage II     Archie Endo 10/14/2012  . Stress incontinence in female   . Subcapital fracture of left hip 11/13/2012    Dr Mardelle Matte   Past Surgical History  Procedure Laterality Date  . Hemicolectomy       Right-sided  . Ovarian cyst removal    . Cataract extraction w/ intraocular lens  implant, bilateral Bilateral    . Knee arthroscopy Left   . Appendectomy    . Carpal tunnel release Left   . Lumbar laminectomy    . Lumbar fusion    . Replacement total knee Left   . Tonsillectomy  1930's  . Hip arthroplasty Left 11/13/2012    Procedure: ARTHROPLASTY BIPOLAR HIP;  Surgeon: Johnny Bridge, MD;  Location: [facility];  Service: Orthopedics;  Laterality: Left;   No family history on file. History   Social History  . Marital Status: Widowed    Spouse Name: N/A    Number of Children: N/A  . Years of Education: N/A   Occupational History  . Not on file.   Social History Main Topics  . Smoking status: Former Smoker -- 1.00 packs/day for 47 years    Types: Cigarettes    Quit date: 08/01/1988  . Smokeless tobacco: Never Used  . Alcohol Use: No  . Drug Use: No  . Sexual Activity: Not on file   Other Topics Concern  . Not on file   Social History Narrative    Husband died of myeloma.  Patient lives with daughter and son in [place], with [facility] services.    Review of Systems: Review of Systems  Constitutional: Negative for fever and malaise/fatigue.  Respiratory: Positive for cough (mild for the last few months). Negative for hemoptysis, sputum production and shortness of breath.   Cardiovascular: Negative for chest pain and leg swelling.  Gastrointestinal: Positive for diarrhea (but none today). Negative for abdominal pain.  Genitourinary: Negative for dysuria and frequency.  Musculoskeletal: Negative for joint pain and myalgias.  Neurological: Negative for dizziness and headaches.     Physical Exam: Blood pressure 105/64, pulse 70, temperature 96.9 F (36.1 C), temperature source Rectal, resp. rate 20, SpO2 93.00%. on room air. Physical Exam  Nursing note and vitals reviewed. Constitutional: She appears well-developed and well-nourished. No distress.  Frail elderly lady, cachetic appearing  HENT:  Head: Normocephalic and atraumatic.  Eyes: EOM are normal. Pupils are equal,  round, and reactive to light.  Cardiovascular: Normal rate, regular rhythm, normal heart sounds and intact distal pulses.   Pulmonary/Chest: Effort normal and breath sounds normal.  Abdominal: Soft. Bowel sounds are normal. She exhibits no distension. There is no tenderness. There is no rebound and no guarding.  Musculoskeletal: She exhibits no edema and no tenderness.  No pelvic or trochanteric tenderness bilaterally  Neurological: She is alert. No cranial nerve deficit.  Oriented to person and place only  Skin: Skin is warm and dry.  No sacral ulcer, blanchable redness    Lab results: Basic Metabolic Panel:  Recent Labs  07/27/13 1653 07/28/13 1248  NA 142 145  K 3.4* 4.2  CL 113* 114*  CO2 18* 16*  GLUCOSE 98 99  BUN 23 28*  CREATININE 1.50* 1.77*  CALCIUM 7.7* 7.7*   Liver Function Tests:  Recent Labs  07/28/13 1248  AST 17  ALT 14  ALKPHOS 116  BILITOT 0.2*  PROT 5.5*  ALBUMIN 1.9*   CBC:  Recent Labs  07/27/13 1653 07/28/13 1248  WBC 10.1 12.6*  NEUTROABS  --  10.7*  HGB 13.5 12.1  HCT 40.4 36.9  MCV 88.2 92.0  PLT 302 218   Urinalysis:  Recent Labs  07/28/13 1306  COLORURINE YELLOW  LABSPEC 1.022  PHURINE 5.5  GLUCOSEU NEGATIVE  HGBUR NEGATIVE  BILIRUBINUR SMALL*  KETONESUR NEGATIVE  PROTEINUR 30*  UROBILINOGEN 0.2  NITRITE NEGATIVE  LEUKOCYTESUR NEGATIVE    Imaging results:  No results found.  Other results: EKG: NSR, normal axis, RBBB, nonspecific T wave changes roughly unchanged from previous on 07/22/13.  Assessment & Plan by Problem: Yilin Weedon is a 80 y/o female with Adrenal insufficiency, chronic malnutrition, chronic diarrhea, recent hip fracture, hx of recurrent ileus who has continued to decline at home after recent hospitalization.  Lactic  Acidosis Concern for hypoperfusion given her history of adrenal insufficiency, low blood pressure, and dehydration.  Patient does not meet SIRS criteria and does not have a  suspected source of infection.  She currently denies any chest pain or pressure or SOB that might suggest MI and her EKG is unchanged.  Her abdominal exam is benign but given her extensive GI history of Colon CA, recurrent ileus mesenteric ischemia is a concern however patient currently has Acute on Chronic kidney disease and family does not want aggressive measures taken. They would not want surgery and do not want a CT scan of her abdomen.   - Will maintain blood pressure with IVF resuscitation, may need to double Cortef given acute illness. - Repeat Lactic Acid at 7pm to assess clearance. -Patient has no changes on EKG and Troponin on admission negative, she reports no chest pain. Will not get further cardiac enzymes given low likelihood of ACS and discussion with family of goals of care.  Acute on CKD Stage III Baseline Cr 1.3, was trending down during last hospitalization and trended down to 1.5 yesterday. Today on admission Cr elevated to 1.77.  Likely prerenal given clinical presentation.  Given IVF in ED. Appears euvolemic on exam. - continue maintainence fluids D5 1/2 NS +20 kcl At 75 cc.  Adrenal Insufficiency Central - Patient hypotensive per EMS, was given small IV bolus and BP responded. - Will continue home dose of Cortef 10mg  in AM and 5mg  in PM if patient has further hypotensive episodes will double home steroid dose given acute illness although I believe the etiology of her hypotension today was poor po intake.  Failure to Thrive/ Severe Protein Calorie malnutrition - Discussion with daughter Rachel Bridges, patient has signed DNR and expressed wishes for no heroic measures to be taken.  They had these discussion after her left hip fracture in July 2014.  There was discussion at that time for hospice however she started to make a good recovery and they never heard any more about hospice.  Rachel Bridges reports that hospice may be the best thing for her mother as the most important thing for her mother is  comfort, however they would like to make sure her acute deterioration over the past month is not reversible.  She does realize that Ms Lenington has multiple medical issues and has had a progressive decline. - Consult palliative care for goals of care discussion and possible hospice involvement. Rachel Bridges reports she understands that Ms Dicostanzo is at risk for aspiration but would like to continue to provide her food despite that risk.  Will start full liquid diet at this time and request SLP eval for further diet recs. - Continue Ensure BID  Hx Colon Ca/ MGUS - At this time daughter Rachel Bridges would not like to persue agressive screening/monitoring for colon cancer or MM.  Code Status: DNR/DNI DVT PPx: Heparin [place] Diet: Full liquid Dispo: Disposition is deferred at this time, awaiting improvement of current medical problems. Anticipated discharge in approximately 2-3 day(s).   The patient does have a current PCP Otho Bellows, MD) and does need an [facility] hospital follow-up appointment after discharge.  The patient does not have transportation limitations that hinder transportation to clinic appointments.  Signed: Joni Reining, DO 07/28/2013, 1:35 PM

## 2013-07-28 NOTE — ED Notes (Addendum)
MD at bedside. Per admitting pt to receive abdom xray before sending to the floor.

## 2013-07-28 NOTE — Telephone Encounter (Signed)
[name] was referred to CSW for possible SNF placement following hospital follow up.  CSW placed call to Pt's daughter, Karena Addison.  Daughter states pt resumed [facility] with [place].  However when [facility] PT assessed pt, daughter states [facility] PT recommended Skilled Rehab stay as pt was in need of greater services then [facility] could provide.  Daughter inquiring about placement for Rachel Bridges.  CSW reviewed chart and informed [name], daughter, pt's most recent hospitalization did not qualify pt to access her Medicare SNF benefit.  CSW offered option for pt to pay private for SNF.  Daughter concerned at this time stating "so my mom is to lay in bed all day".  Daughter complaining of weakness and no change in diarrhea.  CSW informed daughter will have physician call back to discuss pt's current status.

## 2013-07-28 NOTE — ED Notes (Signed)
MD at bedside. 

## 2013-07-28 NOTE — ED Notes (Signed)
Results of lactic acid called to primary nurse Velna Hatchet

## 2013-07-29 DIAGNOSIS — R627 Adult failure to thrive: Secondary | ICD-10-CM

## 2013-07-29 DIAGNOSIS — F0390 Unspecified dementia without behavioral disturbance: Secondary | ICD-10-CM

## 2013-07-29 DIAGNOSIS — N183 Chronic kidney disease, stage 3 unspecified: Secondary | ICD-10-CM

## 2013-07-29 DIAGNOSIS — E872 Acidosis, unspecified: Secondary | ICD-10-CM

## 2013-07-29 DIAGNOSIS — R5381 Other malaise: Secondary | ICD-10-CM

## 2013-07-29 DIAGNOSIS — R5383 Other fatigue: Secondary | ICD-10-CM

## 2013-07-29 DIAGNOSIS — E43 Unspecified severe protein-calorie malnutrition: Secondary | ICD-10-CM

## 2013-07-29 DIAGNOSIS — Z85038 Personal history of other malignant neoplasm of large intestine: Secondary | ICD-10-CM

## 2013-07-29 DIAGNOSIS — Z515 Encounter for palliative care: Secondary | ICD-10-CM

## 2013-07-29 DIAGNOSIS — E2749 Other adrenocortical insufficiency: Secondary | ICD-10-CM

## 2013-07-29 LAB — BASIC METABOLIC PANEL
BUN: 25 mg/dL — ABNORMAL HIGH (ref 6–23)
CHLORIDE: 116 meq/L — AB (ref 96–112)
CO2: 16 mEq/L — ABNORMAL LOW (ref 19–32)
Calcium: 7.2 mg/dL — ABNORMAL LOW (ref 8.4–10.5)
Creatinine, Ser: 1.51 mg/dL — ABNORMAL HIGH (ref 0.50–1.10)
GFR calc Af Amer: 35 mL/min — ABNORMAL LOW (ref >90)
GFR, EST NON AFRICAN AMERICAN: 30 mL/min — AB (ref >90)
GLUCOSE: 106 mg/dL — AB (ref 70–99)
POTASSIUM: 3.8 meq/L (ref 3.7–5.3)
SODIUM: 145 meq/L (ref 137–147)

## 2013-07-29 LAB — CLOSTRIDIUM DIFFICILE BY PCR: CDIFFPCR: NEGATIVE

## 2013-07-29 LAB — GLUCOSE, CAPILLARY
GLUCOSE-CAPILLARY: 72 mg/dL (ref 70–99)
Glucose-Capillary: 98 mg/dL (ref 70–99)
Glucose-Capillary: 104 mg/dL — ABNORMAL HIGH (ref 70–99)

## 2013-07-29 NOTE — Consult Note (Signed)
Patient [id number] Rachel Bridges      DOB: Mar 10, 1925      GDJ:[id number]     Consult Note from the Palliative Medicine Team at [facility] Requested by: Dr. Heber La Yuca     PCP: Otho Bellows, MD Reason for Consultation: [place] and options.    Phone Number:[phone]  Assessment of patients Current state: Rachel Bridges is a 80 yo female admitted with weakness. PMH significant for dementia, CKD stage III, colon cancer (surgical resection 2001), adrenal insufficiently, left hip fracture July 2014, gluten intolerance, recurrent SBO, and protein calorie malnutrition.   I met today with Rachel Bridges and her daughters Karena Addison [phone], [phone]) and Rise Paganini [phone], [phone]). We discussed Rachel Bridges's history focusing on her recent decline over the past ~1 month. Her daughters say she has been more sleepy and weak with decreased appetite and intake. She has chronic diarrhea from what her daughter's say is celiac disease (not sure if she tested positive for celiac but does have gluten intolerance). She had recovered fairly well from her hip fracture July 2014 but has has steady decline since December 2014. Rachel Bridges and her husband live with Rachel Bridges and care for her and Rise Paganini lives nearby. They tell me that her quality of life is greatly suffering. We confirm DNR and establish that Rachel Bridges would never want a feeding tube (we discussed comfort feeds). We also discussed comfort care, hospice, and hospital readmission without any concrete decision. Rachel Bridges says she doesn't know an answer and needs to think about these decisions. Her daughters also wish to know her wishes if she wants another NGT if she were to have recurrent bowel obstruction. They do agree we should focus on comfort and not let Rachel Bridges suffer. The daughters are very realistic and willing to proceed with comfort measures if Rachel Bridges says this is what she wishes. I will continue to follow and support. I will re-meet to continue  discussion tomorrow at 10 am.    Goals of Care: 1.  Code Status: DNR   2. Scope of Treatment: 1. Vital Signs: yes   2. Respiratory/Oxygen: yes 3. Nutritional Support/Tube Feeds: no 4. Antibiotics: yes 5. Blood Products: yes 6. IVF: yes 7. Review of Medications to be discontinued: none 8. Labs: yes 9. Telemetry: yes   4. Disposition: Home with daughter (considering hospice)   3. Symptom Management:   1. Pain: denies 2. Fever: Acetaminophen prn.  3. Nausea/Vomiting: Phenergan prn.   4. Psychosocial: Emotional support provided to patient and family.    Brief HPI: 80 yo female admitted with weakness. PMH significant for dementia, CKD stage III, colon cancer (surgical resection 2001), adrenal insufficiently, left hip fracture July 2014, gluten intolerance, recurrent SBO, and protein calorie malnutrition.     ROS: Denies pain, dyspnea, constipation, + nausea, + diarrhea (chronic)    PMH:  Past Medical History  Diagnosis Date  . MGUS (monoclonal gammopathy of unknown significance)      Followed by Dr. Jamse Arn at regional [place]  . Anxiety   . Depression   . Diverticulosis of colon   . Osteopenia   . Lumbar spinal stenosis   . Atrophic vaginitis   . History of tobacco abuse     at least 64 pack year. Started about age 70 and continued until about age 62.  Marland Kitchen Dermatitis   . Hip pain      History  . Actinic keratosis   . Dementia   . Adrenal  suppression     central, on chronic steroids, followed by Dr. Buddy Duty of endocrinology  . Gluten intolerance     August 2011 endoscopy with biopsy negative for celiac disease. Prior to this patient had elevated TTG IgG of 93 (normal <20). Dr Collene Mares made clinical diagnosis of gluten intolerance based on diarrhea/symptoms from eating gluten. Needs GF diet.   Daughter tries to control diet.  . Shortness of breath     "sometimes; can happen at any time" (10/14/2012)  . Stress incontinence in female   . Subcapital fracture of left  hip 11/13/2012    Dr Mardelle Matte  . History of high cholesterol   . Anemia   . History of blood transfusion     "S/P hip OR"  . History of stomach ulcers     "years ago" (07/28/2013)  . Arthritis     "right knee; back" (07/28/2013)  . Renal cyst   . Chronic kidney disease (CKD), stage IV (severe)   . Colon cancer     History of bowel  obstruction. on treatment with Colace and MiraLax.  followed by Dr. Collene Mares and Dr. Barkley Bruns     PSH: Past Surgical History  Procedure Laterality Date  . Hemicolectomy Right   . Ovarian cyst removal    . Cataract extraction w/ intraocular lens  implant, bilateral Bilateral   . Knee arthroscopy Left   . Appendectomy    . Carpal tunnel release Left   . Lumbar laminectomy    . Lumbar fusion    . Replacement total knee Left   . Tonsillectomy  1930's  . Hip arthroplasty Left 11/13/2012    Procedure: ARTHROPLASTY BIPOLAR HIP;  Surgeon: Johnny Bridge, MD;  Location: [facility];  Service: Orthopedics;  Laterality: Left;  . Cholecystectomy    . Back surgery    . Dilation and curettage of uterus      "lost a set of twins"   I have reviewed the [place] and SH and  If appropriate update it with new information. Allergies  Allergen Reactions  . Oxycodone Hcl Other (See Comments)    Hallucinations-Sundowners syndrome  . Ambien [Zolpidem Tartrate] Other (See Comments)    Hallucinations-Sundowners syndrome  . Morphine And Related Other (See Comments)    Delirium-Sundowners syndrome  . Pantoprazole [Pantoprazole Sodium] Nausea Only  . Lactose Intolerance (Gi) Other (See Comments)    Abdominal distention, diarrhea   Scheduled Meds: . aspirin EC  81 mg Oral Daily  . calcium-vitamin D  1 tablet Oral BID  . feeding supplement (ENSURE COMPLETE)  237 mL Oral BID BM  . heparin  5,000 Units Subcutaneous 3 times per day  . hydrocortisone  10 mg Oral QPM  . hydrocortisone  20 mg Oral q morning - 10a  . paricalcitol  1 mcg Oral Q M,W,F  . sertraline  25 mg Oral q morning -  10a  . sertraline  50 mg Oral QHS   Continuous Infusions: . dextrose 5 % and 0.45% NaCl 75 mL/hr at 07/29/13 1137   PRN Meds:.acetaminophen, HYDROcodone-acetaminophen, promethazine    BP 99/59  Pulse 69  Temp(Src) 97.9 F (36.6 C) (Oral)  Resp 20  Ht 5' 6" (1.676 m)  Wt 55.384 kg (122 lb 1.6 oz)  BMI 19.72 kg/m2  SpO2 97%   PPS: 30%  No intake or output data in the 24 hours ending 07/29/13 1539 LBM: 07/29/13  Physical Exam:  General: NAD, ill appearing, cachectic HEENT:  [place]/AT, no JVD, moist mucous membranes Chest: CTA throughout, no labored breathing CVS: RRR, S1 S2 Abdomen: Soft, NT, ND, +BS Ext: MAE, feet cool to touch, no edema Neuro: Alert, oriented to person/place/somewhat to situation  Labs: CBC    Component Value Date/Time   WBC 12.6* 07/28/2013 1248   WBC 6.1 05/20/2013 0950   RBC 4.01 07/28/2013 1248   RBC 4.01 05/20/2013 0950   RBC 3.90 12/25/2012 1553   HGB 12.1 07/28/2013 1248   HGB 11.6 05/20/2013 0950   HCT 36.9 07/28/2013 1248   HCT 36.7 05/20/2013 0950   PLT 218 07/28/2013 1248   PLT 227 05/20/2013 0950   MCV 92.0 07/28/2013 1248   MCV 91.5 05/20/2013 0950   MCH 30.2 07/28/2013 1248   MCH 28.9 05/20/2013 0950   MCHC 32.8 07/28/2013 1248   MCHC 31.6 05/20/2013 0950   RDW 15.3 07/28/2013 1248   RDW 15.8* 05/20/2013 0950   LYMPHSABS 1.3 07/28/2013 1248   LYMPHSABS 1.8 05/20/2013 0950   MONOABS 0.5 07/28/2013 1248   MONOABS 0.5 05/20/2013 0950   EOSABS 0.1 07/28/2013 1248   EOSABS 0.1 05/20/2013 0950   BASOSABS 0.0 07/28/2013 1248   BASOSABS 0.0 05/20/2013 0950    BMET    Component Value Date/Time   NA 145 07/29/2013 0630   NA 146* 05/20/2013 0950   K 3.8 07/29/2013 0630   K 3.7 05/20/2013 0950   CL 116* 07/29/2013 0630   CO2 16* 07/29/2013 0630   CO2 18* 05/20/2013 0950   GLUCOSE 106* 07/29/2013 0630   GLUCOSE 94 05/20/2013 0950   BUN 25* 07/29/2013 0630   BUN 26.1* 05/20/2013 0950   CREATININE 1.51* 07/29/2013 0630   CREATININE 1.50* 07/27/2013 1653    CREATININE 1.3* 05/20/2013 0950   CALCIUM 7.2* 07/29/2013 0630   CALCIUM 8.2* 05/20/2013 0950   GFRNONAA 30* 07/29/2013 0630   GFRNONAA 31* 07/27/2013 1653   GFRAA 35* 07/29/2013 0630   GFRAA 36* 07/27/2013 1653    CMP     Component Value Date/Time   NA 145 07/29/2013 0630   NA 146* 05/20/2013 0950   K 3.8 07/29/2013 0630   K 3.7 05/20/2013 0950   CL 116* 07/29/2013 0630   CO2 16* 07/29/2013 0630   CO2 18* 05/20/2013 0950   GLUCOSE 106* 07/29/2013 0630   GLUCOSE 94 05/20/2013 0950   BUN 25* 07/29/2013 0630   BUN 26.1* 05/20/2013 0950   CREATININE 1.51* 07/29/2013 0630   CREATININE 1.50* 07/27/2013 1653   CREATININE 1.3* 05/20/2013 0950   CALCIUM 7.2* 07/29/2013 0630   CALCIUM 8.2* 05/20/2013 0950   PROT 5.5* 07/28/2013 1248   PROT 6.0* 05/20/2013 0950   ALBUMIN 1.9* 07/28/2013 1248   ALBUMIN 2.3* 05/20/2013 0950   AST 17 07/28/2013 1248   AST 12 05/20/2013 0950   ALT 14 07/28/2013 1248   ALT 9 05/20/2013 0950   ALKPHOS 116 07/28/2013 1248   ALKPHOS 114 05/20/2013 0950   BILITOT 0.2* 07/28/2013 1248   BILITOT 0.21 05/20/2013 0950   GFRNONAA 30* 07/29/2013 0630   GFRNONAA 31* 07/27/2013 1653   GFRAA 35* 07/29/2013 0630   GFRAA 36* 07/27/2013 1653     Time In Time Out Total Time Spent with Patient Total Overall Time  1400 1530 58mn 93m    Greater than 50%  of this time was spent counseling and coordinating care related to the above assessment and plan.  AlVinie SillNP Palliative  Medicine Team Pager # [phone] (M-F 8a-5p) Team Phone # [phone] (Nights/Weekends)

## 2013-07-29 NOTE — Progress Notes (Signed)
Utilization review completed. Janith Nielson, RN, BSN. 

## 2013-07-29 NOTE — Progress Notes (Signed)
Subjective: Patient seen at bedside, oriented x2.  Reports she still feels unwell "but I'll feel better tomorrow." She denies any acute complaints, no pain. She apparently had an episode of diarrhea last night a C. Diff was checked and was negative. Objective: Vital signs in last 24 hours: Filed Vitals:   07/28/13 1430 07/28/13 1607 07/28/13 2145 07/29/13 0625  BP: 119/64 98/57 104/61 84/61  Pulse: 68 70 68 64  Temp:  97.2 F (36.2 C) 98 F (36.7 C) 97.9 F (36.6 C)  TempSrc:  Rectal Oral Oral  Resp: 22 20 18 18   Height:   5\' 6"  (1.676 m)   Weight:   122 lb 1.6 oz (55.384 kg)   SpO2: 100% 98% 96% 96%   Weight change:  No intake or output data in the 24 hours ending 07/29/13 0749 General: resting in bed HEENT: EOMI Cardiac: RRR Pulm: clear to auscultation bilaterally Abd: soft, nontender, nondistended, BS present Ext: cool but pulses intact, no pedal edema Neuro: alert and oriented X2 Lab Results: Basic Metabolic Panel:  Recent Labs Lab 07/22/13 1519 07/23/13 0502  07/27/13 1653 07/28/13 1248  NA 142 142  < > 142 145  K 3.2* 3.5*  < > 3.4* 4.2  CL 110 112  < > 113* 114*  CO2 16* 15*  < > 18* 16*  GLUCOSE 99 95  < > 98 99  BUN 33* 33*  < > 23 28*  CREATININE 1.95* 1.74*  < > 1.50* 1.77*  CALCIUM 7.4* 7.8*  < > 7.7* 7.7*  MG 1.4* 2.2  --   --   --   < > = values in this interval not displayed. Liver Function Tests:  Recent Labs Lab 07/28/13 1248  AST 17  ALT 14  ALKPHOS 116  BILITOT 0.2*  PROT 5.5*  ALBUMIN 1.9*   CBC:  Recent Labs Lab 07/27/13 1653 07/28/13 1248  WBC 10.1 12.6*  NEUTROABS  --  10.7*  HGB 13.5 12.1  HCT 40.4 36.9  MCV 88.2 92.0  PLT 302 218   Cardiac Enzymes:  Recent Labs Lab 07/22/13 1024 07/22/13 1519 07/28/13 1355  TROPONINI <0.30 <0.30 <0.30   CBG:  Recent Labs Lab 07/28/13 1606  GLUCAP 76   Urinalysis:  Recent Labs Lab 07/28/13 1306  COLORURINE YELLOW  LABSPEC 1.022  PHURINE 5.5  GLUCOSEU NEGATIVE    HGBUR NEGATIVE  BILIRUBINUR SMALL*  KETONESUR NEGATIVE  PROTEINUR 30*  UROBILINOGEN 0.2  NITRITE NEGATIVE  LEUKOCYTESUR NEGATIVE    Micro Results: Recent Results (from the past 240 hour(s))  URINE CULTURE     Status: None   Collection Time    07/21/13  6:50 PM      Result Value Ref Range Status   Specimen Description URINE, CATHETERIZED   Final   Special Requests NONE   Final   Culture  Setup Time     Final   Value: 07/21/2013 20:35     Performed at [facility]     Final   Value: NO GROWTH     Performed at [facility]   Culture     Final   Value: NO GROWTH     Performed at [facility]   Report Status 07/22/2013 FINAL   Final  STOOL CULTURE     Status: None   Collection Time    07/21/13  6:50 PM      Result Value Ref Range Status   Specimen Description  STOOL   Final   Special Requests NONE   Final   Culture     Final   Value: NO SALMONELLA, SHIGELLA, CAMPYLOBACTER, YERSINIA, OR E.COLI 0157:H7 ISOLATED     Performed at [facility]   Report Status 07/25/2013 FINAL   Final  CLOSTRIDIUM DIFFICILE BY PCR     Status: None   Collection Time    07/21/13  6:50 PM      Result Value Ref Range Status   C difficile by pcr NEGATIVE  NEGATIVE Final   Studies/Results: Dg Chest 1 View  07/28/2013   CLINICAL DATA:  Generalized weakness, lactic acidosis  EXAM: CHEST - 1 VIEW  COMPARISON:  None.  FINDINGS: Cardiac silhouette mildly enlarged. An 8 mm nodular density projects within the periphery right lower lobe. Likely represents a nipple shadow a repeat evaluation with nipple markers recommended. Lungs otherwise clear. Air-filled loops colon are again appreciated interposition between the liver and abdominal wall. The osseous structures unremarkable.  IMPRESSION: No active disease.   Electronically Signed   By: Margaree Mackintosh M.D.   On: 07/28/2013 15:32   Dg Abd 2 Views  07/28/2013   CLINICAL DATA:  Abdominal pain and distension.  EXAM:  ABDOMEN - 2 VIEW  COMPARISON:  Abdominal evaluation 03/25/2013  FINDINGS: Gaseous distension of loops of large and small bowel again appreciated. Unchanged from prior. There is no evidence free air. Stable left hip arthroplasty. Stable fusion of the lower lumbar spine. The bones are osteopenic.  IMPRESSION: No significant change in the gas-filled loops of large small bowel. There is no evidence of free air.   Electronically Signed   By: Margaree Mackintosh M.D.   On: 07/28/2013 15:38   Medications: I have reviewed the patient's current medications. Scheduled Meds: . aspirin EC  81 mg Oral Daily  . calcium-vitamin D  1 tablet Oral BID  . feeding supplement (ENSURE COMPLETE)  237 mL Oral BID BM  . heparin  5,000 Units Subcutaneous 3 times per day  . hydrocortisone  10 mg Oral QPM  . hydrocortisone  20 mg Oral q morning - 10a  . paricalcitol  1 mcg Oral Q M,W,F  . sertraline  25 mg Oral q morning - 10a  . sertraline  50 mg Oral QHS   Continuous Infusions: . dextrose 5 % and 0.45% NaCl 75 mL/hr at 07/28/13 2156   PRN Meds:.acetaminophen, HYDROcodone-acetaminophen, promethazine Assessment/Plan: Lactic Acidosis/ Increased Anion gap metabolic acidosis Lactic acid did clear with IVF resuscitation.  Cortef increased to double regular dose. AG improved to 13 this AM although bicarb is still 16. No signs of infection and patient remains afebrile. - Continue supportive care and IVF. -Monitor AM BMP, check CBC.  Acute on CKD Stage III  Baseline Cr 1.3 Trending down to 1.5 today, likely prerenal given diarrhea and dehydration. - Continue D5 1/2 NS at 75 cc  Adrenal Insufficiency Central  - Cortef 20mg  in am 10mg  in PM for 3 days given acute illness   Failure to Thrive/ Severe Malnutrition related to chronic illness - Palliative care consulted - Continue Ensure Complete BID  - Gluten free diet  Code Status: DNR/DNI  DVT PPx: Heparin [place]  Diet: Gluten free Dispo: Disposition is deferred at this  time, awaiting improvement of current medical problems.  Anticipated discharge in approximately 2 day(s).   The patient does have a current PCP Otho Bellows, MD) and does need an [facility] hospital follow-up appointment after discharge.  The patient  does not have transportation limitations that hinder transportation to clinic appointments.  .Services Needed at time of discharge: Y = Yes, Blank = No PT:   OT:   RN:   Equipment:   Other:     LOS: 1 day   Joni Reining, DO 07/29/2013, 7:49 AM

## 2013-07-29 NOTE — Clinical Social Work Note (Signed)
CSW received consult 4/1 for SNF placement. CSW talked with daughter (full assessment to follow) and she is in agreement. CSW will initiate bed search 4/3.  Kyonna Frier Givens, MSW, LCSW [phone]

## 2013-07-29 NOTE — Progress Notes (Signed)
INITIAL NUTRITION ASSESSMENT  DOCUMENTATION CODES Per approved criteria  -Severe malnutrition in the context of chronic illness   INTERVENTION: Continue Gluten-Free diet. Continue Ensure Complete po BID. RD to continue to follow nutrition care plan.  NUTRITION DIAGNOSIS: Malnutrition related to chronic illnesses and FTT as evidenced by severe fat and muscle mass loss.  Goal: Intake to meet >90% of estimated nutrition needs.  Monitor:  weight trends, lab trends, I/O's, PO intake, supplement tolerance  Reason for Assessment: Malnutrition Screening Tool  80 y.o. female  Admitting Dx: Lactic acid acidosis  ASSESSMENT: PMHx significant for CKD, dementia, anxiety, depression, colon CA s/p resection, diverticulosis, MGUS, Celiac disease. She was seen in [facility] day PTA for hospital follow up after admission for recurrent ileus. At that visit she was accompanied by her daughter who reported mild improvement in diarrhea since discharge but continued progressive weakness. Daughter called the MD and CSW the next day, reproting that she has continued to deteriorate and does not think she can care for her, they requested she come in for appointment but daughter reported she could not get her out of bed and needed EMS. Work-up reveals lactic acidosis.  Pt with ongoing FTT and severe malnutrition. Palliative care meeting scheduled for this time.  Currently ordered for Gluten Free diet with thin liquids. Advanced from [employer] on 4/1.  Nutrition Focused Physical Exam:   Subcutaneous Fat:  Orbital Region: moderate wasting Upper Arm Region: severe wasting  Thoracic and Lumbar Region: severe wasting   Muscle:  Temple Region: severe wasting  Clavicle Bone Region: severe wasting  Clavicle and Acromion Bone Region: severe wasting  Scapular Bone Region: severe wasting  Dorsal Hand: severe wasting  Patellar Region: severe wasting  Anterior Thigh Region: severe wasting   Posterior Calf Region: severe wasting   Edema: not present  Pt meets criteria for severe MALNUTRITION in the context of chronic illness as evidenced by severe fat and muscle mass loss.  Potassium and magnesium WNL.  Height: Ht Readings from Last 1 Encounters:  07/28/13 5\' 6"  (1.676 m)    Weight: Wt Readings from Last 1 Encounters:  07/28/13 122 lb 1.6 oz (55.384 kg)    Ideal Body Weight: 125 lb  % Ideal Body Weight: 98%  Wt Readings from Last 10 Encounters:  07/28/13 122 lb 1.6 oz (55.384 kg)  07/27/13 120 lb (54.432 kg)  07/23/13 118 lb 9.7 oz (53.8 kg)  06/30/13 124 lb 1.6 oz (56.291 kg)  06/03/13 124 lb 11.2 oz (56.564 kg)  05/20/13 121 lb 12.8 oz (55.248 kg)  04/07/13 122 lb 11.2 oz (55.656 kg)  03/09/13 123 lb 1.6 oz (55.838 kg)  01/28/13 122 lb 14.4 oz (55.747 kg)  12/25/12 123 lb 12.8 oz (56.155 kg)    Usual Body Weight: 122 - 123 lb  % Usual Body Weight: 100%  BMI:  Body mass index is 19.72 kg/(m^2). WNL  Estimated Nutritional Needs: Kcal: 1375 - 1500 Protein: 55 - 65 g Fluid: 1.4 - 1.6 liters  Skin: intact  Diet Order: Gluten Restricted  EDUCATION NEEDS: -No education needs identified at this time  No intake or output data in the 24 hours ending 07/29/13 1346  Last BM: 4/1 - diarrhea  Labs:   Recent Labs Lab 07/22/13 1519 07/23/13 0502  07/27/13 1653 07/28/13 1248 07/29/13 0630  NA 142 142  < > 142 145 145  K 3.2* 3.5*  < > 3.4* 4.2 3.8  CL 110 112  < > 113*  114* 116*  CO2 16* 15*  < > 18* 16* 16*  BUN 33* 33*  < > 23 28* 25*  CREATININE 1.95* 1.74*  < > 1.50* 1.77* 1.51*  CALCIUM 7.4* 7.8*  < > 7.7* 7.7* 7.2*  MG 1.4* 2.2  --   --   --   --   GLUCOSE 99 95  < > 98 99 106*  < > = values in this interval not displayed.  CBG (last 3)   Recent Labs  07/28/13 1606 07/29/13 0757 07/29/13 1128  GLUCAP 76 98 72    Scheduled Meds: . aspirin EC  81 mg Oral Daily  . calcium-vitamin D  1 tablet Oral BID  . feeding supplement  (ENSURE COMPLETE)  237 mL Oral BID BM  . heparin  5,000 Units Subcutaneous 3 times per day  . hydrocortisone  10 mg Oral QPM  . hydrocortisone  20 mg Oral q morning - 10a  . paricalcitol  1 mcg Oral Q M,W,F  . sertraline  25 mg Oral q morning - 10a  . sertraline  50 mg Oral QHS    Continuous Infusions: . dextrose 5 % and 0.45% NaCl 75 mL/hr at 07/29/13 1137    Past Medical History  Diagnosis Date  . MGUS (monoclonal gammopathy of unknown significance)      Followed by Dr. Jamse Arn at [facility] [place]  . Anxiety   . Depression   . Diverticulosis of colon   . Osteopenia   . Lumbar spinal stenosis   . Atrophic vaginitis   . History of tobacco abuse     at least 64 pack year. Started about age 6 and continued until about age 72.  Marland Kitchen Dermatitis   . Hip pain      History  . Actinic keratosis   . Dementia   . Adrenal suppression     central, on chronic steroids, followed by Dr. Buddy Duty of endocrinology  . Gluten intolerance     August 2011 endoscopy with biopsy negative for celiac disease. Prior to this patient had elevated TTG IgG of 93 (normal <20). Dr Collene Mares made clinical diagnosis of gluten intolerance based on diarrhea/symptoms from eating gluten. Needs GF diet.   Daughter tries to control diet.  . Shortness of breath     "sometimes; can happen at any time" (10/14/2012)  . Stress incontinence in female   . Subcapital fracture of left hip 11/13/2012    Dr Mardelle Matte  . History of high cholesterol   . Anemia   . History of blood transfusion     "S/P hip OR"  . History of stomach ulcers     "years ago" (07/28/2013)  . Arthritis     "right knee; back" (07/28/2013)  . Renal cyst   . Chronic kidney disease (CKD), stage IV (severe)   . Colon cancer     History of bowel  obstruction. on treatment with Colace and MiraLax.  followed by Dr. Collene Mares and Dr. Barkley Bruns    Past Surgical History  Procedure Laterality Date  . Hemicolectomy Right   . Ovarian cyst removal    . Cataract  extraction w/ intraocular lens  implant, bilateral Bilateral   . Knee arthroscopy Left   . Appendectomy    . Carpal tunnel release Left   . Lumbar laminectomy    . Lumbar fusion    . Replacement total knee Left   . Tonsillectomy  1930's  . Hip arthroplasty Left 11/13/2012    Procedure: ARTHROPLASTY  BIPOLAR HIP;  Surgeon: Johnny Bridge, MD;  Location: [place];  Service: Orthopedics;  Laterality: Left;  . Cholecystectomy    . Back surgery    . Dilation and curettage of uterus      "lost a set of twins"    Inda Coke MS, RD, LDN Inpatient Registered Dietitian Pager: [phone] After-hours pager: [phone]

## 2013-07-29 NOTE — H&P (Signed)
  Date: 07/29/2013  Patient name: Rachel Bridges  Medical record number: [id number]  Date of birth: Apr 20, 1925   I have seen and evaluated Rachel Bridges and discussed their care with the Residency Team. Rachel Bridges is well known to the team. She was admitted with acute on chronic deconditioning, weakness, decreased PO intake, and a non productive cough for months. She was found to be acidotic, with a gap of 15, bicarb of 16, and elevated lactic acid level.   She has had palliative care consults last year and has good ideas of what she wants and doesn't want. Another mtg is planned.   She is alert and talkative - at her baseline. She [place] thin and pale. Her Abd is distended as usual but is non tender.   Vitals, labs, and studies were reviewed.   Assessment and Plan: I have seen and evaluated the patient as outlined above. I agree with the formulated Assessment and Plan as detailed in the residents' admission note, with the following changes:   1. Lactic acidosis / metabolic acidosis with gap - Has resolved except bicarb still 16. Likely 2/2 temp decreased perfusion / hypotension from GI loss and decreased PO intake. Cont support care.   2. Chronic renal failure - cont IVF and watch Cr.   3. Adrenal insuff - agree with increase in steroid dose for 3 days 2/2 acute illness.  4. Severe malnutrition - PO intake as she wants.   Bartholomew Crews, MD 4/2/20153:44 PM

## 2013-07-30 DIAGNOSIS — F0390 Unspecified dementia without behavioral disturbance: Secondary | ICD-10-CM

## 2013-07-30 DIAGNOSIS — R627 Adult failure to thrive: Secondary | ICD-10-CM

## 2013-07-30 DIAGNOSIS — R5383 Other fatigue: Secondary | ICD-10-CM

## 2013-07-30 DIAGNOSIS — R5381 Other malaise: Secondary | ICD-10-CM

## 2013-07-30 DIAGNOSIS — E43 Unspecified severe protein-calorie malnutrition: Secondary | ICD-10-CM

## 2013-07-30 LAB — BASIC METABOLIC PANEL
BUN: 21 mg/dL (ref 6–23)
CHLORIDE: 110 meq/L (ref 96–112)
CO2: 19 mEq/L (ref 19–32)
Calcium: 7.4 mg/dL — ABNORMAL LOW (ref 8.4–10.5)
Creatinine, Ser: 1.32 mg/dL — ABNORMAL HIGH (ref 0.50–1.10)
GFR calc Af Amer: 41 mL/min — ABNORMAL LOW (ref >90)
GFR, EST NON AFRICAN AMERICAN: 35 mL/min — AB (ref >90)
Glucose, Bld: 100 mg/dL — ABNORMAL HIGH (ref 70–99)
Potassium: 3.6 mEq/L — ABNORMAL LOW (ref 3.7–5.3)
SODIUM: 140 meq/L (ref 137–147)

## 2013-07-30 LAB — CBC
HCT: 31.4 % — ABNORMAL LOW (ref 36.0–46.0)
Hemoglobin: 10.5 g/dL — ABNORMAL LOW (ref 12.0–15.0)
MCH: 30.3 pg (ref 26.0–34.0)
MCHC: 33.4 g/dL (ref 30.0–36.0)
MCV: 90.8 fL (ref 78.0–100.0)
Platelets: 181 10*3/uL (ref 150–400)
RBC: 3.46 MIL/uL — AB (ref 3.87–5.11)
RDW: 15.5 % (ref 11.5–15.5)
WBC: 6.4 10*3/uL (ref 4.0–10.5)

## 2013-07-30 LAB — GLUCOSE, CAPILLARY: GLUCOSE-CAPILLARY: 91 mg/dL (ref 70–99)

## 2013-07-30 MED ORDER — RAMELTEON 8 MG PO TABS
8.0000 mg | ORAL_TABLET | Freq: Once | ORAL | Status: AC
  Administered 2013-07-31: 8 mg via ORAL
  Filled 2013-07-30: qty 1

## 2013-07-30 MED ORDER — ENSURE COMPLETE PO LIQD: 237.0000 mL | Freq: Two times a day (BID) | ORAL | Status: AC

## 2013-07-30 MED ORDER — CALCIUM CITRATE-VITAMIN D 315-200 MG-UNIT PO TABS: 1.0000 | ORAL_TABLET | Freq: Two times a day (BID) | ORAL | Status: AC

## 2013-07-30 NOTE — Clinical Social Work Psychosocial (Signed)
Clinical Social Work Department BRIEF PSYCHOSOCIAL ASSESSMENT 07/30/2013  Patient:  Rachel Bridges, Rachel Bridges     Account Number:  [id number]     Admit date:  07/28/2013  Clinical Social Worker:  Frederico Hamman  Date/Time:  07/30/2013 10:52 AM  Referred by:  Physician  Date Referred:  07/28/2013 Referred for  SNF Placement   Other Referral:   Interview type:   Other interview type:    PSYCHOSOCIAL DATA Living Status:  FAMILY Admitted from facility:   Level of care:   Primary support name:  Demetrice Floyd Primary support relationship to patient:  CHILD, ADULT Degree of support available:   Patient's daughter Kandee Keen lives very close by. Both daughters assist in taking care of patient.    CURRENT CONCERNS Current Concerns  Post-Acute Placement   Other Concerns:    SOCIAL WORK ASSESSMENT / PLAN On 07/29/12 CSW talked with patient's daughter Demetrice Karena Addison) [place] by phone regarding discharge plans for patient and recommendation of short-term rehab. Daughter indicated that they had already talked about it and they and patient know she is weak and needs rehab. Daughter stated facility choice is [facility] as patient has been there before and they were very pleased with the care.    On 07/30/13 CSW visited the room and spoke with patient and both daughters: Demetrice [place] and [employer]. Patient nodded agreement with going to [facility] and daughters emphasized that they discuss everything with their mother as it concerns her treatment or care.    CSW contacted admissions coordinator Lexine Baton at [facility] and she can take patient 4/4 (3-day qualifying stay). Patient and daughters informed and daughter Demetrice will complete admissions paperwork at [facility] today (4/3).   Assessment/plan status:  Psychosocial Support/Ongoing Assessment of Needs Other assessment/ plan:   Information/referral to community resources:   None needed or requested at this time.     PATIENT'S/FAMILY'S RESPONSE TO PLAN OF CARE: Mrs. Arauz is alert and mostly listened to the conversation between Smyer and her daughters, and nodded agreement with [facility].  Patient's daughters are very involved and supportive of their mother and appreciative of CSW's assistance with SNF placement for short-term rehab.

## 2013-07-30 NOTE — Progress Notes (Signed)
INTERNAL MEDICINE TEACHING SERVICE DAILY PROGRESS NOTE  Subjective: Patient reports feeling "better" today. She has no complaints and no diarrhea.  Of note there is a half eaten small package of graham crackers at bedside.  Her BP has been stable overnight. Objective: Vital signs in last 24 hours: Filed Vitals:   07/29/13 0909 07/29/13 1347 07/29/13 2054 07/30/13 0512  BP: 110/61 99/59 101/64 98/63  Pulse: 64 69 60 60  Temp: 97.9 F (36.6 C) 97.9 F (36.6 C) 97.4 F (36.3 C) 98.4 F (36.9 C)  TempSrc: Oral Oral Oral Oral  Resp: 18 20 18 18   Height:      Weight:      SpO2: 96% 97% 96% 95%   Weight change:   Intake/Output Summary (Last 24 hours) at 07/30/13 0802 Last data filed at 07/29/13 2254  Gross per 24 hour  Intake    240 ml  Output      0 ml  Net    240 ml   General: resting in bed, pleasant HEENT: EOMI Cardiac: RRR Pulm: clear to auscultation bilaterally Abd: soft, nontender, BS present Ext: warm and well perfused, no pedal edema Neuro: alert and oriented X2 Lab Results: Basic Metabolic Panel:  Recent Labs Lab 07/29/13 0630 07/30/13 0550  NA 145 140  K 3.8 3.6*  CL 116* 110  CO2 16* 19  GLUCOSE 106* 100*  BUN 25* 21  CREATININE 1.51* 1.32*  CALCIUM 7.2* 7.4*   Liver Function Tests:  Recent Labs Lab 07/28/13 1248  AST 17  ALT 14  ALKPHOS 116  BILITOT 0.2*  PROT 5.5*  ALBUMIN 1.9*   CBC:  Recent Labs Lab 07/28/13 1248 07/30/13 0550  WBC 12.6* 6.4  NEUTROABS 10.7*  --   HGB 12.1 10.5*  HCT 36.9 31.4*  MCV 92.0 90.8  PLT 218 181   Cardiac Enzymes:  Recent Labs Lab 07/28/13 1355  TROPONINI <0.30   CBG:  Recent Labs Lab 07/28/13 1606 07/29/13 0757 07/29/13 1128 07/29/13 1617  GLUCAP 76 98 72 104*   Urinalysis:  Recent Labs Lab 07/28/13 1306  COLORURINE YELLOW  LABSPEC 1.022  PHURINE 5.5  GLUCOSEU NEGATIVE  HGBUR NEGATIVE  BILIRUBINUR SMALL*  KETONESUR NEGATIVE  PROTEINUR 30*  UROBILINOGEN 0.2  NITRITE  NEGATIVE  LEUKOCYTESUR NEGATIVE    Micro Results: Recent Results (from the past 240 hour(s))  URINE CULTURE     Status: None   Collection Time    07/21/13  6:50 PM      Result Value Ref Range Status   Specimen Description URINE, CATHETERIZED   Final   Special Requests NONE   Final   Culture  Setup Time     Final   Value: 07/21/2013 20:35     Performed at [place]     Final   Value: NO GROWTH     Performed at [facility]   Culture     Final   Value: NO GROWTH     Performed at [facility]   Report Status 07/22/2013 FINAL   Final  STOOL CULTURE     Status: None   Collection Time    07/21/13  6:50 PM      Result Value Ref Range Status   Specimen Description STOOL   Final   Special Requests NONE   Final   Culture     Final   Value: NO SALMONELLA, SHIGELLA, CAMPYLOBACTER, YERSINIA, OR E.COLI 0157:H7 ISOLATED  Performed at [facility]   Report Status 07/25/2013 FINAL   Final  CLOSTRIDIUM DIFFICILE BY PCR     Status: None   Collection Time    07/21/13  6:50 PM      Result Value Ref Range Status   C difficile by pcr NEGATIVE  NEGATIVE Final  CLOSTRIDIUM DIFFICILE BY PCR     Status: None   Collection Time    07/28/13  5:53 PM      Result Value Ref Range Status   C difficile by pcr NEGATIVE  NEGATIVE Final   Studies/Results: Dg Chest 1 View  07/28/2013   CLINICAL DATA:  Generalized weakness, lactic acidosis  EXAM: CHEST - 1 VIEW  COMPARISON:  None.  FINDINGS: Cardiac silhouette mildly enlarged. An 8 mm nodular density projects within the periphery right lower lobe. Likely represents a nipple shadow a repeat evaluation with nipple markers recommended. Lungs otherwise clear. Air-filled loops colon are again appreciated interposition between the liver and abdominal wall. The osseous structures unremarkable.  IMPRESSION: No active disease.   Electronically Signed   By: Margaree Mackintosh M.D.   On: 07/28/2013 15:32   Dg Abd 2  Views  07/28/2013   CLINICAL DATA:  Abdominal pain and distension.  EXAM: ABDOMEN - 2 VIEW  COMPARISON:  Abdominal evaluation 03/25/2013  FINDINGS: Gaseous distension of loops of large and small bowel again appreciated. Unchanged from prior. There is no evidence free air. Stable left hip arthroplasty. Stable fusion of the lower lumbar spine. The bones are osteopenic.  IMPRESSION: No significant change in the gas-filled loops of large small bowel. There is no evidence of free air.   Electronically Signed   By: Margaree Mackintosh M.D.   On: 07/28/2013 15:38   Medications: I have reviewed the patient's current medications. Scheduled Meds: . aspirin EC  81 mg Oral Daily  . calcium-vitamin D  1 tablet Oral BID  . feeding supplement (ENSURE COMPLETE)  237 mL Oral BID BM  . heparin  5,000 Units Subcutaneous 3 times per day  . hydrocortisone  10 mg Oral QPM  . hydrocortisone  20 mg Oral q morning - 10a  . paricalcitol  1 mcg Oral Q M,W,F  . sertraline  25 mg Oral q morning - 10a  . sertraline  50 mg Oral QHS   Continuous Infusions: . dextrose 5 % and 0.45% NaCl 75 mL/hr at 07/30/13 0237   PRN Meds:.acetaminophen, HYDROcodone-acetaminophen, promethazine Assessment/Plan: Lactic Acidosis/ Increased Anion gap metabolic acidosis - Bicarb trending up, lactic acidoisis resolved (like due to hypoperfusion), WBC  down to 6.4 - Continue supportive care and IVF.  Acute on CKD Stage III resolved Baseline Cr 1.3 Trending down to baseline today, likely prerenal given diarrhea and dehydration. - Continue D5 1/2 NS at 75 cc  Adrenal Insufficiency Central  - Cortef 20mg  in am 10mg  in PM for 3 days given acute illness   Failure to Thrive/ Severe Malnutrition related to chronic illness - Palliative care consulted - Continue Ensure Complete BID  - Gluten free diet  Deconditioning -Will have PT eval, as family is still uncertain about hospice care.  Code Status: DNR/DNI  DVT PPx: Heparin [place]  Diet: Gluten  free Dispo: Disposition is deferred at this time, awaiting improvement of current medical problems.  Anticipated discharge in approximately 1 day(s).   The patient does have a current PCP Otho Bellows, MD) and does need an [facility] hospital follow-up appointment after discharge.  The patient does not  have transportation limitations that hinder transportation to clinic appointments.  .Services Needed at time of discharge: Y = Yes, Blank = No PT:   OT:   RN:   Equipment:   Other:     LOS: 2 days   Joni Reining, DO 07/30/2013, 8:02 AM

## 2013-07-30 NOTE — Evaluation (Signed)
Clinical/Bedside Swallow Evaluation Patient Details  Name: Doretha Goding MRN: [id number] Date of Birth: 05-21-1924  Today's Date: 07/30/2013 Time: 6712-4580 SLP Time Calculation (min): 25 min  Past Medical History:  Past Medical History  Diagnosis Date  . MGUS (monoclonal gammopathy of unknown significance)      Followed by Dr. Jamse Arn at [facility]  . Anxiety   . Depression   . Diverticulosis of colon   . Osteopenia   . Lumbar spinal stenosis   . Atrophic vaginitis   . History of tobacco abuse     at least 64 pack year. Started about age 45 and continued until about age 67.  Marland Kitchen Dermatitis   . Hip pain      History  . Actinic keratosis   . Dementia   . Adrenal suppression     central, on chronic steroids, followed by Dr. Buddy Duty of endocrinology  . Gluten intolerance     August 2011 endoscopy with biopsy negative for celiac disease. Prior to this patient had elevated TTG IgG of 93 (normal <20). Dr Collene Mares made clinical diagnosis of gluten intolerance based on diarrhea/symptoms from eating gluten. Needs GF diet.   Daughter tries to control diet.  . Shortness of breath     "sometimes; can happen at any time" (10/14/2012)  . Stress incontinence in female   . Subcapital fracture of left hip 11/13/2012    Dr Mardelle Matte  . History of high cholesterol   . Anemia   . History of blood transfusion     "S/P hip OR"  . History of stomach ulcers     "years ago" (07/28/2013)  . Arthritis     "right knee; back" (07/28/2013)  . Renal cyst   . Chronic kidney disease (CKD), stage IV (severe)   . Colon cancer     History of bowel  obstruction. on treatment with Colace and MiraLax.  followed by Dr. Collene Mares and Dr. Barkley Bruns   Past Surgical History:  Past Surgical History  Procedure Laterality Date  . Hemicolectomy Right   . Ovarian cyst removal    . Cataract extraction w/ intraocular lens  implant, bilateral Bilateral   . Knee arthroscopy Left   . Appendectomy    . Carpal tunnel  release Left   . Lumbar laminectomy    . Lumbar fusion    . Replacement total knee Left   . Tonsillectomy  1930's  . Hip arthroplasty Left 11/13/2012    Procedure: ARTHROPLASTY BIPOLAR HIP;  Surgeon: Johnny Bridge, MD;  Location: [facility];  Service: Orthopedics;  Laterality: Left;  . Cholecystectomy    . Back surgery    . Dilation and curettage of uterus      "lost a set of twins"   HPI:  Ms.Fleurette Aurelia Gras is a 80 y.o. female with a PMH significant for dementia, CKD stage 3, MGUS, Colon CA, Osteopenia, left hip fracture in July 2014, gluten intolerance, recurrent SBO, GERD, adrenal insufficiency, and protein malnutrition.  She was seen in our clinic yesterday for hospital follow up after admission for recurrent illeius.  At that visit she was accompanied by her daughter Karena Addison) who reported mild improvement in diarrhea since discharge but continued progressive weakness.  He daughter was called the morning by clinic social worker and attending MD, daughter reported that she has continued to deteriorate and does not think she can care for her, they requested she come in for appointment but daughter reported she could not get her out of  bed and needed EMS.  EMS was called and she was brought to the ED for evaluation.  In the ED daughter reported the diarrhea has improved but weakness is worse and patient in now completely non ambulatory.  Karena Addison reports that she took only 2 bites of food last night, she did request some water this morning but only took 2 sips of water.  Karena Addison reports that she knows that her mother has been declining but she reports she is just not herself.   Assessment / Plan / Recommendation Clinical Impression  Pt did not demonstrate any overt s/s of aspiration during bedside. Her swallow was mildly delayed, and she did swallow 2-3 times with most trials. Also mildly prolonged oral phase with cracker. Pt reported that sometimes she gets choked on liquids; discussed strategy of taking  small sips at a time. Given these findings pt's risk of aspiration is none-mild. Rx regular/ thin liquid diet, meds whole with liquid. ST will sign off at this time; please re-consult if needed.    Aspiration Risk  Mild    Diet Recommendation Regular;Thin liquid   Liquid Administration via: Cup;Straw Medication Administration: Whole meds with liquid Supervision: Intermittent supervision to cue for compensatory strategies Compensations: Slow rate;Small sips/bites Postural Changes and/or Swallow Maneuvers: Seated upright 90 degrees;Upright 30-60 min after meal    Other  Recommendations Oral Care Recommendations: Oral care BID   Follow Up Recommendations  None    Frequency and Duration        Pertinent Vitals/Pain n/a    SLP Swallow Goals     Swallow Study Prior Functional Status       General HPI: Ms.Diannia Maziyah Vessel is a 80 y.o. female with a PMH significant for dementia, CKD stage 3, MGUS, Colon CA, Osteopenia, left hip fracture in July 2014, gluten intolerance, recurrent SBO, GERD, adrenal insufficiency, and protein malnutrition.  She was seen in our clinic yesterday for hospital follow up after admission for recurrent illeius.  At that visit she was accompanied by her daughter Karena Addison) who reported mild improvement in diarrhea since discharge but continued progressive weakness.  He daughter was called the morning by clinic social worker and attending MD, daughter reported that she has continued to deteriorate and does not think she can care for her, they requested she come in for appointment but daughter reported she could not get her out of bed and needed EMS.  EMS was called and she was brought to the ED for evaluation.  In the ED daughter reported the diarrhea has improved but weakness is worse and patient in now completely non ambulatory.  Karena Addison reports that she took only 2 bites of food last night, she did request some water this morning but only took 2 sips of water.  Karena Addison  reports that she knows that her mother has been declining but she reports she is just not herself. Type of Study: Bedside swallow evaluation Diet Prior to this Study: Regular;Thin liquids Temperature Spikes Noted: No Respiratory Status: Room air History of Recent Intubation: No Behavior/Cognition: Alert;Cooperative;Pleasant mood Oral Cavity - Dentition: Edentulous Self-Feeding Abilities: Able to feed self Patient Positioning: Upright in bed Baseline Vocal Quality: Breathy Volitional Cough: Cognitively unable to elicit    Oral/Motor/Sensory Function Overall Oral Motor/Sensory Function: Appears within functional limits for tasks assessed Labial ROM: Within Functional Limits Labial Symmetry: Within Functional Limits Labial Strength: Within Functional Limits Lingual ROM: Within Functional Limits Lingual Symmetry: Within Functional Limits Lingual Strength: Within Functional Limits   Ice Chips  Ice chips: Not tested   Thin Liquid Thin Liquid: Impaired Presentation: Cup;Straw Pharyngeal  Phase Impairments: Suspected delayed Swallow;Multiple swallows    Nectar Thick Nectar Thick Liquid: Not tested   Honey Thick     Puree Puree: Impaired Presentation: Spoon Pharyngeal Phase Impairments: Suspected delayed Swallow;Multiple swallows   Solid   GO    Solid: Impaired Presentation: Self Fed Oral Phase Impairments: Other (comment);Impaired mastication (prolonged oral phase likely d/t slow mastication)       Oleksiak, Amy K, MA, CCC-SLP 07/30/2013,3:48 PM

## 2013-07-30 NOTE — Evaluation (Signed)
Physical Therapy Evaluation Patient Details Name: Rachel Bridges MRN: [id number] DOB: 11-02-1924 Today's Date: 07/30/2013   History of Present Illness  [name] is a 80 y.o. female with a PMH significant for dementia, CKD stage 3, MGUS, Colon CA, Osteopenia, left hip fracture in July 2014, gluten intolerance, recurrent SBO, GERD, adrenal insufficiency, and protein malnutrition.  Pt with multiple recent admissions. Daughter report pt has become deconditioned over past 2 weeks.  Clinical Impression  Pt adm due to the above. Presents with decline in functional mobility secondary to deficits indicated below. Pt to benefit from skilled acute PT to address deficits indicated below and maximize mobility prior to D/C from acute setting. Recommend SNF at this time due to pt deconditioning and increased (A) level needed at home at this time. Daughters and pt agreeable. Pt coughing while eating. Recommend SLP evaluation.     Follow Up Recommendations SNF;Supervision/Assistance - 24 hour    Equipment Recommendations  None recommended by PT    Recommendations for Other Services       Precautions / Restrictions Precautions Precautions: Fall Precaution Comments: h/o falls Restrictions Weight Bearing Restrictions: No      Mobility  Bed Mobility Overal bed mobility: Needs Assistance Bed Mobility: Supine to Sit     Supine to sit: Min assist;HOB elevated     General bed mobility comments: incr time and min (A) to elevate trunk to sitting position due to weakness   Transfers Overall transfer level: Needs assistance Equipment used: Rolling walker (2 wheeled) Transfers: Sit to/from [facility] Sit to Stand: Mod assist Stand pivot transfers: Mod assist       General transfer comment: mod (A) to stupport trunk and manage RW with transfers; cues for hand placement and sequencing; weak LEs noted during transfers   Ambulation/Gait                 Stairs            Wheelchair Mobility    Modified Rankin (Stroke Patients Only)       Balance Overall balance assessment: Needs assistance;History of Falls Sitting-balance support: Feet supported Sitting balance-Leahy Scale: Fair Sitting balance - Comments: accepts minimal weightshifts Postural control: Left lateral lean Standing balance support: During functional activity;Bilateral upper extremity supported Standing balance-Leahy Scale: Poor Standing balance comment: requries UEs to be supported with RW and (A) to maintain balance                             Pertinent Vitals/Pain No c/o pain.     Home Living Family/patient expects to be discharged to:: Skilled nursing facility                 Additional Comments: PTA pt living with daughter and son in law;Daughter reports pt is to be D/C to [facility] due to incr (A) needed at home     Prior Function           Comments: Daughter reports pt has become total care in past 2 weeks due to weakness; was able to ambulate with RW throughout house but has been total care as of recently     Hand Dominance   Dominant Hand: Right    Extremity/Trunk Assessment   Upper Extremity Assessment: Generalized weakness           Lower Extremity Assessment: Generalized weakness      Cervical / Trunk Assessment: Other exceptions  Communication  Communication: No difficulties  Cognition Arousal/Alertness: Awake/alert Behavior During Therapy: WFL for tasks assessed/performed Overall Cognitive Status: History of cognitive impairments - at baseline (Dementia )                      General Comments      Exercises        Assessment/Plan    PT Assessment Patient needs continued PT services  PT Diagnosis Difficulty walking;Generalized weakness   PT Problem List Decreased strength;Decreased activity tolerance;Decreased balance;Decreased mobility;Decreased cognition;Decreased knowledge of  use of DME;Decreased safety awareness  PT Treatment Interventions DME instruction;Gait training;Functional mobility training;Therapeutic activities;Therapeutic exercise;Balance training;Neuromuscular re-education;Patient/family education   PT Goals (Current goals can be found in the Care Plan section) Acute Rehab PT Goals Patient Stated Goal: none stated PT Goal Formulation: With patient/family Time For Goal Achievement: 08/13/13 Potential to Achieve Goals: Fair    Frequency Min 2X/week   Barriers to discharge   pt has become total (A) for daughter; will benefit from SNF     Co-evaluation               End of Session Equipment Utilized During Treatment: Gait belt Activity Tolerance: Patient tolerated treatment well Patient left: with call bell/phone within reach;with family/visitor present;in chair Nurse Communication: Mobility status         Time: 0923-3007 PT Time Calculation (min): 19 min   Charges:   PT Evaluation $Initial PT Evaluation Tier I: 1 Procedure PT Treatments $Therapeutic Activity: 8-22 mins   PT G CodesGustavus Bryant, [place] [phone] 07/30/2013, 10:31 AM

## 2013-07-30 NOTE — Consult Note (Signed)
I have reviewed this case with our NP and agree with the Assessment and Plan as stated.  Demeco Ducksworth L. Monteen Toops, MD MBA The Palliative Medicine Team at [facility] Team Phone: [phone] Pager: [phone]   

## 2013-07-30 NOTE — Progress Notes (Signed)
Chaplain responded to spiritual consult. Pt was a bit tearful but responsive to questions. Chaplain provided emotional support and prayer. Will follow up as necessary.

## 2013-07-30 NOTE — Progress Notes (Signed)
Progress Note from the Palliative Medicine Team at [facility]  Subjective: I met again with Rachel Bridges, Rachel Bridges, and Rachel Bridges. We continued discussion about rehospitalization and hospice and focus on comfort. They wish for her to have rehab at SNF to see if she is able to regain any strength to help upon return to home. They would like to have hospice help them continue these decisions and help navigate her care upon return to home. They want Rachel Bridges to make the final decision for hospice/comfort but she is unable to make this decision (unsure if she is able to fully process and comprehend). They finally decide to proceed with hospice after rehab. I gave MOST form for them to review (already decided DNR and no feeding tube).    Objective: Allergies  Allergen Reactions  . Oxycodone Hcl Other (See Comments)    Hallucinations-Sundowners syndrome  . Ambien [Zolpidem Tartrate] Other (See Comments)    Hallucinations-Sundowners syndrome  . Morphine And Related Other (See Comments)    Delirium-Sundowners syndrome  . Pantoprazole [Pantoprazole Sodium] Nausea Only  . Lactose Intolerance (Gi) Other (See Comments)    Abdominal distention, diarrhea   Scheduled Meds: . aspirin EC  81 mg Oral Daily  . calcium-vitamin D  1 tablet Oral BID  . feeding supplement (ENSURE COMPLETE)  237 mL Oral BID BM  . heparin  5,000 Units Subcutaneous 3 times per day  . hydrocortisone  10 mg Oral QPM  . hydrocortisone  20 mg Oral q morning - 10a  . paricalcitol  1 mcg Oral Q M,W,F  . sertraline  25 mg Oral q morning - 10a  . sertraline  50 mg Oral QHS   Continuous Infusions: . dextrose 5 % and 0.45% NaCl 75 mL/hr at 07/30/13 0237   PRN Meds:.acetaminophen, HYDROcodone-acetaminophen, promethazine  BP 98/62  Pulse 66  Temp(Src) 98 F (36.7 C) (Oral)  Resp 18  Ht 5' 6" (1.676 m)  Wt 55.384 kg (122 lb 1.6 oz)  BMI 19.72 kg/m2  SpO2 96%   PPS: 30%  Pain Score: denies    Intake/Output Summary (Last 24 hours)  at 07/30/13 1626 Last data filed at 07/30/13 1500  Gross per 24 hour  Intake    480 ml  Output      0 ml  Net    480 ml      LBM: 07/29/13      Physical Exam:  General: NAD, ill appearing, cachectic, sitting up in chair HEENT: [place]/AT, no JVD, moist mucous membranes  Chest: CTA throughout, no labored breathing  CVS: RRR, S1 S2  Abdomen: Soft, NT, ND, +BS  Ext: MAE, feet cool to touch, no edema  Neuro: Alert, oriented to person/place/somewhat to situation    Labs: CBC    Component Value Date/Time   WBC 6.4 07/30/2013 0550   WBC 6.1 05/20/2013 0950   RBC 3.46* 07/30/2013 0550   RBC 4.01 05/20/2013 0950   RBC 3.90 12/25/2012 1553   HGB 10.5* 07/30/2013 0550   HGB 11.6 05/20/2013 0950   HCT 31.4* 07/30/2013 0550   HCT 36.7 05/20/2013 0950   PLT 181 07/30/2013 0550   PLT 227 05/20/2013 0950   MCV 90.8 07/30/2013 0550   MCV 91.5 05/20/2013 0950   MCH 30.3 07/30/2013 0550   MCH 28.9 05/20/2013 0950   MCHC 33.4 07/30/2013 0550   MCHC 31.6 05/20/2013 0950   RDW 15.5 07/30/2013 0550   RDW 15.8* 05/20/2013 0950   LYMPHSABS 1.3 07/28/2013 1248     LYMPHSABS 1.8 05/20/2013 0950   MONOABS 0.5 07/28/2013 1248   MONOABS 0.5 05/20/2013 0950   EOSABS 0.1 07/28/2013 1248   EOSABS 0.1 05/20/2013 0950   BASOSABS 0.0 07/28/2013 1248   BASOSABS 0.0 05/20/2013 0950    BMET    Component Value Date/Time   NA 140 07/30/2013 0550   NA 146* 05/20/2013 0950   K 3.6* 07/30/2013 0550   K 3.7 05/20/2013 0950   CL 110 07/30/2013 0550   CO2 19 07/30/2013 0550   CO2 18* 05/20/2013 0950   GLUCOSE 100* 07/30/2013 0550   GLUCOSE 94 05/20/2013 0950   BUN 21 07/30/2013 0550   BUN 26.1* 05/20/2013 0950   CREATININE 1.32* 07/30/2013 0550   CREATININE 1.50* 07/27/2013 1653   CREATININE 1.3* 05/20/2013 0950   CALCIUM 7.4* 07/30/2013 0550   CALCIUM 8.2* 05/20/2013 0950   GFRNONAA 35* 07/30/2013 0550   GFRNONAA 31* 07/27/2013 1653   GFRAA 41* 07/30/2013 0550   GFRAA 36* 07/27/2013 1653    CMP     Component Value Date/Time   NA 140 07/30/2013 0550   NA 146*  05/20/2013 0950   K 3.6* 07/30/2013 0550   K 3.7 05/20/2013 0950   CL 110 07/30/2013 0550   CO2 19 07/30/2013 0550   CO2 18* 05/20/2013 0950   GLUCOSE 100* 07/30/2013 0550   GLUCOSE 94 05/20/2013 0950   BUN 21 07/30/2013 0550   BUN 26.1* 05/20/2013 0950   CREATININE 1.32* 07/30/2013 0550   CREATININE 1.50* 07/27/2013 1653   CREATININE 1.3* 05/20/2013 0950   CALCIUM 7.4* 07/30/2013 0550   CALCIUM 8.2* 05/20/2013 0950   PROT 5.5* 07/28/2013 1248   PROT 6.0* 05/20/2013 0950   ALBUMIN 1.9* 07/28/2013 1248   ALBUMIN 2.3* 05/20/2013 0950   AST 17 07/28/2013 1248   AST 12 05/20/2013 0950   ALT 14 07/28/2013 1248   ALT 9 05/20/2013 0950   ALKPHOS 116 07/28/2013 1248   ALKPHOS 114 05/20/2013 0950   BILITOT 0.2* 07/28/2013 1248   BILITOT 0.21 05/20/2013 0950   GFRNONAA 35* 07/30/2013 0550   GFRNONAA 31* 07/27/2013 1653   GFRAA 41* 07/30/2013 0550   GFRAA 36* 07/27/2013 1653     Assessment and Plan: 1. Code Status: DNR 2. Symptom Control: 1. Fever: Acetaminophen prn. 2. Nausea: Phenergan prn. 3. Weakness: Continue medical management. PT following.  3. Psycho/Social: Emotional support provided to patient and family.  4. Disposition: SNF for rehab then home with daughter and hospice support.   Patient Documents Completed or Given: Document Given Completed  Advanced Directives Pkt    MOST yes   DNR    Gone from My Sight    Hard Choices      Time In Time Out Total Time Spent with Patient Total Overall Time  1000 1100 63mn 662m    Greater than 50%  of this time was spent counseling and coordinating care related to the above assessment and plan.  AlVinie SillNP Palliative Medicine Team Pager # [phone]-F [phone]) Team Phone # [phone]/Weekends)    1

## 2013-07-30 NOTE — Discharge Summary (Signed)
Name: Rachel Bridges MRN: [id number] DOB: [birth year] 80 y.o. PCP: Otho Bellows, MD  Date of Admission: 07/28/2013 11:06 AM Date of Discharge: 07/31/2013 Attending Physician: Bartholomew Crews, MD  Discharge Diagnosis: Principal Problem:   Lactic acid acidosis Active Problems:   Adult failure to thrive   Adrenal insufficiency   Protein-calorie malnutrition, severe   Stress incontinence in female   Colon cancer   MGUS (monoclonal gammopathy of unknown significance)   Dehydration   Acute on chronic kidney disease, stage 3   Nausea   Palliative care encounter  Discharge Medications:   Medication List    STOP taking these medications       potassium chloride 10 MEQ tablet  Commonly known as:  K-DUR      TAKE these medications       acetaminophen 325 MG tablet  Commonly known as:  TYLENOL  Take 325 mg by mouth daily as needed. For pain     aspirin 81 MG tablet  Take 81 mg by mouth daily.     calcium citrate-vitamin D 315-200 MG-UNIT per tablet  Commonly known as:  CITRACAL+D  Take 1 tablet by mouth 2 (two) times daily.     cyanocobalamin 1000 MCG/ML injection  Commonly known as:  (VITAMIN B-12)  Inject 1 mL (1,000 mcg total) into the muscle every 30 (thirty) days.     feeding supplement (ENSURE COMPLETE) Liqd  Take 237 mLs by mouth 2 (two) times daily between meals.     furosemide 20 MG tablet  Commonly known as:  LASIX  Take 20 mg by mouth daily as needed for fluid or edema.     HYDROcodone-acetaminophen 5-325 MG per tablet  Commonly known as:  NORCO  Take 1-2 tablets by mouth at bedtime as needed for moderate pain. MAXIMUM TOTAL ACETAMINOPHEN DOSE IS 4000 MG PER DAY     hydrocortisone 10 MG tablet  Commonly known as:  CORTEF  Take 1 tablet (10 mg total) by mouth once.     hydrocortisone 10 MG tablet  Commonly known as:  CORTEF  Take 5-10 mg by mouth 2 (two) times daily. 10 mg in the a.m. And 5 mg in the afternoon.     ondansetron 4 MG tablet   Commonly known as:  ZOFRAN  Take 1 tablet (4 mg total) by mouth daily as needed for nausea or vomiting.     paricalcitol 1 MCG capsule  Commonly known as:  ZEMPLAR  Take 1 mcg by mouth every Monday, Wednesday, and Friday.     sertraline 25 MG tablet  Commonly known as:  ZOLOFT  Take 1 tablet (25 mg total) by mouth every morning.     sertraline 50 MG tablet  Commonly known as:  ZOLOFT  Take 1 tablet (50 mg total) by mouth at bedtime.     simethicone 80 MG chewable tablet  Commonly known as:  MYLICON  Chew 320 mg by mouth daily as needed. For flatulence       Disposition and follow-up:   Rachel Bridges was discharged from [facility] in Stable condition.  At the hospital follow up visit please address:  Progress with rehab. Compliance with gluten free diet/ status of chronic diarrhea.  Desire for hospice care or less aggressive medical care (ie Bone Survey for MGUS which was originally scheduled for 07/28/13)--per daughter, patient, and palliative discussions.   Hypotension--baseline low. BP on d/c 96/59.  Increased cortef dose during admission x3 days.  Needs cortef 57m evening of discharge 07/31/13 then back to home dose starting 08/01/13, 139min am and 47m32mn pm.   Hypokalemia--repeat bmet. K 4.1 on discharge. Did not send home on KduRamost repeat bmet and replace as needed.   2.  Labs / imaging needed at time of follow-up: BMP, Mag  3.  Pending labs/ test needing follow-up: Consider repeat CXR--pCXR on 4/1: An 8 mm nodular density projects within the periphery right lower lobe. Likely represents a nipple shadow a repeat evaluation with nipple markers recommended.  Follow-up Appointments: Follow-up Information   Follow up with GleOtho BellowsD In 1 week.   Specialty:  Internal Medicine   Contact information:   [id number] [phone]   Discharge Instructions: Discharge Orders   Future Appointments Provider  Department Dept Phone   09/02/2013 3:15 PM KatOtho BellowsD [id number]/22/2016 10:00 AM [facility] [id number]/22/2016 10:30 AM [facility] Covering Provider 1 [facility] [id number] Orders Complete By Expires   Call MD for:  difficulty breathing, headache or visual disturbances  As directed    Increase activity slowly  As directed      Consultations: Treatment Team:  Palliative [facility]  Procedures Performed:  Ct Abdomen Pelvis Wo Contrast  07/21/2013   CLINICAL DATA:  lower abd pain, worse in RLQ, diarrhea, does report hx of appy  EXAM: CT ABDOMEN AND PELVIS WITHOUT CONTRAST  TECHNIQUE: Multidetector CT imaging of the abdomen and pelvis was performed following the standard protocol without intravenous contrast.  COMPARISON:  DG PELVIS 1-2 VIEWS dated 03/25/2013; CT ABD/PELVIS W CM dated 10/14/2012; CT ABD/PELVIS W CM dated 11/16/2011  FINDINGS: Stable 5 mm and nodule anterior base right lung peripheral scarring base of the left lung lungs otherwise clear.  Non contrasted evaluation of the liver, spleen, adrenals, pancreas is unremarkable. Stable benign-appearing cyst within the right and left kidneys.  When compared to previous study persistent gaseous distention of loops of large bowel are appreciated with the decompression in the distal transverse colon at the level of the splenic flexure. Persistent dilated loops of small bowel are again identified and also appear unchanged. There is no evidence of free air.  There is no evidence of abdominal or pelvic masses, free fluid, no loculated fluid collections.  Stable postsurgical changes are appreciated within the thoracolumbar spine. No aggressive appearing osseous lesions are identified.  IMPRESSION: Continued gaseous distention of the large bowel and dilated loops of small bowel. When compared to the previous  study these findings are stable. No definitive mechanical obstruction is appreciated. These findings raise concern of obstruction secondary to dysmotility. Differential considerations Ogilvie's syndrome.  Stable nodule right lung base likely benign.   Electronically Signed   By: HecMargaree MackintoshD.   On: 07/21/2013 21:18   Dg Chest 1 View  07/28/2013   CLINICAL DATA:  Generalized weakness, lactic acidosis  EXAM: CHEST - 1 VIEW  COMPARISON:  None.  FINDINGS: Cardiac silhouette mildly enlarged. An 8 mm nodular density projects within the periphery right lower lobe. Likely represents a nipple shadow a repeat evaluation with nipple markers recommended. Lungs otherwise clear. Air-filled loops colon are again appreciated interposition between the liver and abdominal wall. The osseous structures unremarkable.  IMPRESSION: No active disease.   Electronically Signed   By: HecMargaree MackintoshD.  On: 07/28/2013 15:32   Dg Chest Port 1 View  07/22/2013   CLINICAL DATA:  Shortness of breath.  EXAM: PORTABLE CHEST - 1 VIEW  COMPARISON:  Chest x-ray 03/25/2013.  FINDINGS: No consolidative airspace disease. No pleural effusions. No evidence of pulmonary edema. Heart size appears borderline enlarged, but is distorted by patient's rotation to the left which also distorts upper mediastinal contours. Atherosclerosis in the thoracic aorta. Lucency beneath the right hemidiaphragm compatible with colonic interposition between the diaphragm and the liver as demonstrated on numerous prior examinations.  IMPRESSION: 1. No radiographic evidence of acute cardiopulmonary disease. 2. Atherosclerosis.   Electronically Signed   By: Vinnie Langton M.D.   On: 07/22/2013 00:22   Dg Abd 2 Views  07/28/2013   CLINICAL DATA:  Abdominal pain and distension.  EXAM: ABDOMEN - 2 VIEW  COMPARISON:  Abdominal evaluation 03/25/2013  FINDINGS: Gaseous distension of loops of large and small bowel again appreciated. Unchanged from prior. There is no  evidence free air. Stable left hip arthroplasty. Stable fusion of the lower lumbar spine. The bones are osteopenic.  IMPRESSION: No significant change in the gas-filled loops of large small bowel. There is no evidence of free air.   Electronically Signed   By: Margaree Mackintosh M.D.   On: 07/28/2013 15:38   Admission HPI: Rachel Bridges is a 80 y.o. female with a PMH significant for dementia, CKD stage 3, MGUS, Colon CA, Osteopenia, left hip fracture in July 2014, gluten intolerance, recurrent SBO, GERD, adrenal insufficiency, and protein malnutrition. She was seen in our clinic yesterday for hospital follow up after admission for recurrent illeius. At that visit she was accompanied by her daughter Rachel Bridges) who reported mild improvement in diarrhea since discharge but continued progressive weakness. He daughter was called the morning by clinic social worker and attending MD, daughter reported that she has continued to deteriorate and does not think she can care for her, they requested she come in for appointment but daughter reported she could not get her out of bed and needed EMS. EMS was called and she was brought to the ED for evaluation. In the ED daughter reported the diarrhea has improved but weakness is worse and patient in now completely non ambulatory. Rachel Bridges reports that she took only 2 bites of food last night, she did request some water this morning but only took 2 sips of water. Rachel Bridges reports that she knows that her mother has been declining but she reports she is just not herself.  Rachel Bridges reports that after the hip fracture in July they had end of life discussion and Rachel Bridges requested no "heroic" measure be taken, She requested to be DNR, would not want a long term feeding tube.  Rachel Bridges reports that she does not feel well, she denies chest pain/pressure, SOB, fever, chills, abdominal pain, dysuria, back pain, neck pain, hip pain.  Hospital Course by problem list: Increased Anion Gap Metabolic  Acidosis secondary to Lactic Acidosis Patient presented via EMS with reports of low BP, on arrival to the ED her Blood pressure improved with IV fluid bolus.  Initial labs showed a CO2 of 16 and an increased Anion gap of 15.  A latic acid was checked and it was elevated to 3.84.  She reported no symptoms suggestive of infection, a U/A did not suggest infection and her WBC was mildly elevated.  She did not meet SIRS criteria.  She was treated with IVF and a reapt lactic acid was 1.9  suggesting improved perfusion.  Given her history of AI and acute illness with diarrhea her hydrocortisone dose was doubled and she was monitored closely.  Her WBC returned to 6.4 and her CO2 improved to 19, her Anion gap closed.  She began to feel clinically better and PT was able to see her inpatient. They recommended SNF placement for rehab.  Acute on Chronic CKD Stage III Patient had AKI on previous admission which was trending down. However on this admission her Cr increased again.  She was treated with IVF bolus as a prerenal etiology was suspected given her poor po intake and diarrhea.  She was maintained with maintanence IVF. Her creatine return back to her baseline before discharge.  Central Adrenal Insufficiency Patient was hypotensive in ED with evidence of hypoperfusion and given IVF bolus, her blood pressure did respond.  Her home steroid dose was doubled for 3 days given her acute illness requiring hospitalization, and discharged back home on regular dose of cortef.   Failure to Thrive/ Severe Malnutrition related to chronic illness Patient's daughter expressed that her mother has continued to decline after her hip fracture over a year ago. She reported that comfort of Rachel Bridges was her primary concern and they had agreed to DNR, no tube feeds, no surgeries, and no aggressive treatment.  That had discussed hospice after hip fracture but no decisions were made.  Palliative care was consulted and met with Rachel Bridges  and her two daughters.  They reaffirmed desire for DNR, comfort feeds despite aspiration risk, no tube feeds, no surgery.  As Rachel Bridges did improve with treatment they came to the decision to try acute rehab in a SNF.  They wished to postpone further discussion about hospice, and further screening of MGUS and other issues until after she had attempted rehab.  They reported desire if rehab did not improve her QOL hospice may be the next best choice.  Hypokalemia--resolved during admission.  Was on kdur 21mq bid at home. Discontinued on discharge as K was 4.1, but will need follow up bmet in at least one week and replace as needed.  Encouraged regular and adequate po intake.   Discharge Vitals:   BP 96/59  Pulse 67  Temp(Src) 97.5 F (36.4 C) (Oral)  Resp 22  Ht '5\' 6"'  (1.676 m)  Wt 128 lb 8 oz (58.287 kg)  BMI 20.75 kg/m2  SpO2 98%  Discharge Labs:  Results for orders placed during the hospital encounter of 07/28/13 (from the past 24 hour(s))  GLUCOSE, CAPILLARY     Status: None   Collection Time    07/31/13  8:00 AM      Result Value Ref Range   Glucose-Capillary 82  70 - 99 mg/dL  BASIC METABOLIC PANEL     Status: Abnormal   Collection Time    07/31/13 11:03 AM      Result Value Ref Range   Sodium 144  137 - 147 mEq/L   Potassium 4.1  3.7 - 5.3 mEq/L   Chloride 111  96 - 112 mEq/L   CO2 18 (*) 19 - 32 mEq/L   Glucose, Bld 86  70 - 99 mg/dL   BUN 23  6 - 23 mg/dL   Creatinine, Ser 1.30 (*) 0.50 - 1.10 mg/dL   Calcium 7.9 (*) 8.4 - 10.5 mg/dL   GFR calc non Af Amer 36 (*) >90 mL/min   GFR calc Af Amer 42 (*) >90 mL/min  MAGNESIUM     Status:  None   Collection Time    07/31/13 11:03 AM      Result Value Ref Range   Magnesium 1.5  1.5 - 2.5 mg/dL   Signed: Jerene Pitch, MD 07/31/2013, 12:38 PM   Time Spent on Discharge: 45 minutes Services Ordered on Discharge: PT--SNF Equipment Ordered on Discharge: none

## 2013-07-30 NOTE — Clinical Social Work Placement (Addendum)
Clinical Social Work Department CLINICAL SOCIAL WORK PLACEMENT NOTE 07/30/2013  Patient:  [name], [name]  Account Number:  [id number] Admit date:  07/28/2013  Clinical Social Worker:  Crawford Givens, LCSW  Date/time:  07/30/2013 11:59 AM  Clinical Social Work is seeking post-discharge placement for this patient at the following level of care:   [place]   (*CSW will update this form in Epic as items are completed)     Patient/family provided with [place] Department of Clinical Social Work's list of facilities offering this level of care within the geographic area requested by the patient (or if unable, by the patient's family).  07/29/2013  Patient/family informed of their freedom to choose among providers that offer the needed level of care, that participate in Medicare, Medicaid or managed care program needed by the patient, have an available bed and are willing to accept the patient.    Patient/family informed of [facility]' ownership interest in [facility], as well as of the fact that they are under no obligation to receive care at this facility.  PASARR submitted to [employer] in 2013  PASARR number received from [place] in 2013   FL2 transmitted to all facilities in geographic area requested by pt/family on  07/29/2013 FL2 transmitted to all facilities within larger geographic area on   Patient informed that his/her managed care company has contracts with or will negotiate with  certain facilities, including the following:     Patient/family informed of bed offers received:  07/30/2013 Patient chooses bed at [place] Physician recommends and patient chooses bed at    Patient to be transferred to [place] on  07/31/2013 Patient to be transferred to facility by [facility] [place], 07/31/2013  The following physician request were entered in Epic:   Additional Comments: **Patient will have a  3-Day qualifying stay on Saturday, 07/31/13 **Patient's daughter Demetrice will transport patient to [facility] skilled nursing facility.

## 2013-07-30 NOTE — Progress Notes (Signed)
Speech Therapy evaluated swallowing.  [place] for regular diet.  No swallowing issues identified.

## 2013-07-31 LAB — BASIC METABOLIC PANEL
BUN: 23 mg/dL (ref 6–23)
CO2: 18 mEq/L — ABNORMAL LOW (ref 19–32)
Calcium: 7.9 mg/dL — ABNORMAL LOW (ref 8.4–10.5)
Chloride: 111 mEq/L (ref 96–112)
Creatinine, Ser: 1.3 mg/dL — ABNORMAL HIGH (ref 0.50–1.10)
GFR calc non Af Amer: 36 mL/min — ABNORMAL LOW (ref >90)
GFR, EST AFRICAN AMERICAN: 42 mL/min — AB (ref >90)
Glucose, Bld: 86 mg/dL (ref 70–99)
POTASSIUM: 4.1 meq/L (ref 3.7–5.3)
SODIUM: 144 meq/L (ref 137–147)

## 2013-07-31 LAB — GLUCOSE, CAPILLARY: Glucose-Capillary: 82 mg/dL (ref 70–99)

## 2013-07-31 LAB — MAGNESIUM: MAGNESIUM: 1.5 mg/dL (ref 1.5–2.5)

## 2013-07-31 MED ORDER — SODIUM CHLORIDE 0.9 % IV SOLN: INTRAVENOUS | Status: DC

## 2013-07-31 MED ORDER — HYDROCODONE-ACETAMINOPHEN 5-325 MG PO TABS: 1.0000 | ORAL_TABLET | Freq: Every evening | ORAL | Status: DC | PRN

## 2013-07-31 MED ORDER — HYDROCORTISONE 10 MG PO TABS: 10.0000 mg | ORAL_TABLET | Freq: Once | ORAL | Status: AC

## 2013-07-31 NOTE — Discharge Instructions (Signed)
Please take 10mg  of cortef this evening (07/31/13) and tomorrow 08/01/13 go back to regular home dose of 10mg  in AM and 5mg  in PM.   Please follow up with PCP office in one week and have repeat lab work done.   Please continue your discussions with palliative care as planned.   You are encouraged to keep up your meals and good food and liquid intake as tolerated.

## 2013-07-31 NOTE — Progress Notes (Addendum)
INTERNAL MEDICINE TEACHING SERVICE DAILY PROGRESS NOTE  Subjective: Rachel Bridges was seen and examined at bedside this morning.  She had just woken up but alert and oriented and very pleasant.  She has no complaints at this time.    Objective: Vital signs in last 24 hours: Filed Vitals:   07/30/13 1000 07/30/13 1400 07/30/13 1800 07/30/13 2119  BP: 104/66 98/62 113/82 92/58  Pulse: 65 66 68 69  Temp: 98 F (36.7 C) 98 F (36.7 C) 98.6 F (37 C) 98 F (36.7 C)  TempSrc: Oral Oral Oral Oral  Resp: 18 18 18 18   Height:    5\' 6"  (1.676 m)  Weight:    128 lb 8 oz (58.287 kg)  SpO2: 95% 96% 98% 96%   Weight change:   Intake/Output Summary (Last 24 hours) at 07/31/13 0731 Last data filed at 07/30/13 1800  Gross per 24 hour  Intake    360 ml  Output      0 ml  Net    360 ml   General: resting in bed, NAD HEENT: EOMI Cardiac: RRR Pulm: clear to auscultation bilaterally Abd: soft, nontender, BS present Ext: warm and well perfused, no pedal edema, multiple ecchymoses on b/l extremities, tenderness to palpation of b/l lowe extremities  Neuro: alert and oriented X2 Skin: shiny, wrinkled, warm  Lab Results: Basic Metabolic Panel:  Recent Labs Lab 07/29/13 0630 07/30/13 0550  NA 145 140  K 3.8 3.6*  CL 116* 110  CO2 16* 19  GLUCOSE 106* 100*  BUN 25* 21  CREATININE 1.51* 1.32*  CALCIUM 7.2* 7.4*   Liver Function Tests:  Recent Labs Lab 07/28/13 1248  AST 17  ALT 14  ALKPHOS 116  BILITOT 0.2*  PROT 5.5*  ALBUMIN 1.9*   CBC:  Recent Labs Lab 07/28/13 1248 07/30/13 0550  WBC 12.6* 6.4  NEUTROABS 10.7*  --   HGB 12.1 10.5*  HCT 36.9 31.4*  MCV 92.0 90.8  PLT 218 181   Cardiac Enzymes:  Recent Labs Lab 07/28/13 1355  TROPONINI <0.30   CBG:  Recent Labs Lab 07/28/13 1606 07/29/13 0757 07/29/13 1128 07/29/13 1617 07/30/13 0750  GLUCAP 76 98 72 104* 91   Urinalysis:  Recent Labs Lab 07/28/13 1306  COLORURINE YELLOW  LABSPEC 1.022   PHURINE 5.5  GLUCOSEU NEGATIVE  HGBUR NEGATIVE  BILIRUBINUR SMALL*  KETONESUR NEGATIVE  PROTEINUR 30*  UROBILINOGEN 0.2  NITRITE NEGATIVE  LEUKOCYTESUR NEGATIVE   Micro Results: Recent Results (from the past 240 hour(s))  URINE CULTURE     Status: None   Collection Time    07/21/13  6:50 PM      Result Value Ref Range Status   Specimen Description URINE, CATHETERIZED   Final   Special Requests NONE   Final   Culture  Setup Time     Final   Value: 07/21/2013 20:35     Performed at [place]     Final   Value: NO GROWTH     Performed at [facility]   Culture     Final   Value: NO GROWTH     Performed at [facility]   Report Status 07/22/2013 FINAL   Final  STOOL CULTURE     Status: None   Collection Time    07/21/13  6:50 PM      Result Value Ref Range Status   Specimen Description STOOL   Final   Special Requests  NONE   Final   Culture     Final   Value: NO SALMONELLA, SHIGELLA, CAMPYLOBACTER, YERSINIA, OR E.COLI 0157:H7 ISOLATED     Performed at [facility]   Report Status 07/25/2013 FINAL   Final  CLOSTRIDIUM DIFFICILE BY PCR     Status: None   Collection Time    07/21/13  6:50 PM      Result Value Ref Range Status   C difficile by pcr NEGATIVE  NEGATIVE Final  CLOSTRIDIUM DIFFICILE BY PCR     Status: None   Collection Time    07/28/13  5:53 PM      Result Value Ref Range Status   C difficile by pcr NEGATIVE  NEGATIVE Final   Medications: I have reviewed the patient's current medications. Scheduled Meds: . aspirin EC  81 mg Oral Daily  . calcium-vitamin D  1 tablet Oral BID  . feeding supplement (ENSURE COMPLETE)  237 mL Oral BID BM  . heparin  5,000 Units Subcutaneous 3 times per day  . hydrocortisone  10 mg Oral QPM  . hydrocortisone  20 mg Oral q morning - 10a  . paricalcitol  1 mcg Oral Q M,W,F  . sertraline  25 mg Oral q morning - 10a  . sertraline  50 mg Oral QHS   Continuous Infusions:   PRN  Meds:.acetaminophen, HYDROcodone-acetaminophen, promethazine Assessment/Plan: Lactic Acidosis/ Increased Anion gap metabolic acidosis--resolved. - Continue supportive care and IVF  Hypokalemia--K 3.6 yesterday in setting of CKD. Has been hypokalemic in the past.  Improved with IVF with K supplementation.  On kdur 20eq bid at home will hold on d/c.  -recheck bmet, and mag -replace as needed   Acute on CKD Stage III--resolved.  Baseline Cr 1.3  Adrenal Insufficiency Central  - Cortef 20mg  in am 10mg  in PM, today is day 3/3, will revert back to home dose on discharge   Failure to Thrive/ Severe Malnutrition related to chronic illness - Palliative care following, SNF for rehab then home with daughter and likely hospice support - Continue Ensure Complete BID  - Gluten free diet as tolerated by patient  Deconditioning -PT recommends SNF  Code Status: DNR/DNI  DVT PPx: Heparin [place]  Diet: Gluten free Dispo: D/C to SNF today  The patient does have a current PCP Otho Bellows, MD) and does need an [facility] hospital follow-up appointment after discharge.  The patient does not have transportation limitations that hinder transportation to clinic appointments.  Services Needed at time of discharge: Y = Yes, Blank = No PT: SNF  OT:   RN:   Equipment:   Other:     LOS: 3 days   Jerene Pitch, MD 07/31/2013, 7:31 AM

## 2013-07-31 NOTE — Clinical Social Work Note (Signed)
CSW informed of patient discharge. CSW met with patient and discussed d/c to [facility]. Patient agreeable with d/c and requested CSW contact her daughter. CSW verbalized understanding of the above. CSW faxed discharge summary to [facility] and confirmed bed availability. CSW contacted patient's daughter Demetrice Tyrone Nine and informed her of patient d/c to [facility]. Patient's daughter thanked CSW for informing her of patient's discharge and stated she would be transporting patient to facility.  CSW later met with patient and daughter and provided daughter with transfer packet. Patient's nurse was provided with report and room #. No further needs identified. CSW signing off.  [place], [place] Weekend Clinical Social Worker [phone]

## 2013-08-02 ENCOUNTER — Non-Acute Institutional Stay (SKILLED_NURSING_FACILITY): Payer: Medicare Other | Admitting: Internal Medicine

## 2013-08-02 ENCOUNTER — Other Ambulatory Visit: Payer: Self-pay | Admitting: *Deleted

## 2013-08-02 DIAGNOSIS — E872 Acidosis, unspecified: Secondary | ICD-10-CM

## 2013-08-02 DIAGNOSIS — N189 Chronic kidney disease, unspecified: Secondary | ICD-10-CM

## 2013-08-02 DIAGNOSIS — E2740 Unspecified adrenocortical insufficiency: Secondary | ICD-10-CM

## 2013-08-02 DIAGNOSIS — E2749 Other adrenocortical insufficiency: Secondary | ICD-10-CM

## 2013-08-02 MED ORDER — HYDROCODONE-ACETAMINOPHEN 5-325 MG PO TABS: ORAL_TABLET | ORAL | Status: DC

## 2013-08-02 NOTE — Progress Notes (Signed)
Patient ID: Rachel Bridges, female   DOB: [birth year], 80 y.o.   MRN: [id number] Facility; [facility] Chief complaint; admission review post stay at acute care, [place], from 4/1 through 4/4 History; this is an 80 year old lady who was admitted with chronic weakness, and mobility, diarrhea and severe protein calorie malnutrition. She was found to have a bicarbonate of 16 with an anion gap of 15 and an elevated lactic acid level. She required an increase in her usual steroid dose brackets adrenal insufficiency] and according to the notes she did some better with this. I note that she was seen by the palliative care team and apparently has been seen by them in the past. She was noted to have chronic renal failure, severe protein calorie malnutrition with an albumin level of 1.9. She was rehydrated.  Past Medical History  Diagnosis Date  . MGUS (monoclonal gammopathy of unknown significance)      Followed by Dr. Jamse Arn at regional [place]  . Anxiety   . Depression   . Diverticulosis of colon   . Osteopenia   . Lumbar spinal stenosis   . Atrophic vaginitis   . History of tobacco abuse     at least 64 pack year. Started about age 51 and continued until about age 48.  Marland Kitchen Dermatitis   . Hip pain      History  . Actinic keratosis   . Dementia   . Adrenal suppression     central, on chronic steroids, followed by Dr. Buddy Duty of endocrinology  . Gluten intolerance     August 2011 endoscopy with biopsy negative for celiac disease. Prior to this patient had elevated TTG IgG of 93 (normal <20). Dr Collene Mares made clinical diagnosis of gluten intolerance based on diarrhea/symptoms from eating gluten. Needs GF diet.   Daughter tries to control diet.  . Shortness of breath     "sometimes; can happen at any time" (10/14/2012)  . Stress incontinence in female   . Subcapital fracture of left hip 11/13/2012    Dr Mardelle Matte  . History of high cholesterol   . Anemia   . History of blood transfusion    "S/P hip OR"  . History of stomach ulcers     "years ago" (07/28/2013)  . Arthritis     "right knee; back" (07/28/2013)  . Renal cyst   . Chronic kidney disease (CKD), stage IV (severe)   . Colon cancer     History of bowel  obstruction. on treatment with Colace and MiraLax.  followed by Dr. Collene Mares and Dr. Barkley Bruns   Past Surgical History  Procedure Laterality Date  . Hemicolectomy Right   . Ovarian cyst removal    . Cataract extraction w/ intraocular lens  implant, bilateral Bilateral   . Knee arthroscopy Left   . Appendectomy    . Carpal tunnel release Left   . Lumbar laminectomy    . Lumbar fusion    . Replacement total knee Left   . Tonsillectomy  1930's  . Hip arthroplasty Left 11/13/2012    Procedure: ARTHROPLASTY BIPOLAR HIP;  Surgeon: Johnny Bridge, MD;  Location: [facility];  Service: Orthopedics;  Laterality: Left;  . Cholecystectomy    . Back surgery    . Dilation and curettage of uterus      "lost a set of twins"   Current Outpatient Prescriptions on File Prior to Visit  Medication Sig Dispense Refill  . acetaminophen (TYLENOL) 325 MG tablet Take 325 mg by mouth  daily as needed. For pain      . aspirin 81 MG tablet Take 81 mg by mouth daily.       . calcium citrate-vitamin D (CITRACAL+D) 315-200 MG-UNIT per tablet Take 1 tablet by mouth 2 (two) times daily.  60 tablet  11  . cyanocobalamin (,VITAMIN B-12,) 1000 MCG/ML injection Inject 1 mL (1,000 mcg total) into the muscle every 30 (thirty) days.  1 mL  11  . feeding supplement, ENSURE COMPLETE, (ENSURE COMPLETE) LIQD Take 237 mLs by mouth 2 (two) times daily between meals.      . furosemide (LASIX) 20 MG tablet Take 20 mg by mouth daily as needed for fluid or edema.      Marland Kitchen HYDROcodone-acetaminophen (NORCO) 5-325 MG per tablet Take one tablet by mouth at bedtime as needed for moderate pain; Take two tablets by mouth at bedtime as needed for moderate pain  60 tablet  0  . hydrocortisone (CORTEF) 10 MG tablet Take 5-10 mg by  mouth 2 (two) times daily. 10 mg in the a.m. And 5 mg in the afternoon.      . ondansetron (ZOFRAN) 4 MG tablet Take 1 tablet (4 mg total) by mouth daily as needed for nausea or vomiting.  30 tablet  1  . paricalcitol (ZEMPLAR) 1 MCG capsule Take 1 mcg by mouth every Monday, Wednesday, and Friday.      . sertraline (ZOLOFT) 25 MG tablet Take 1 tablet (25 mg total) by mouth every morning.  30 tablet  6  . sertraline (ZOLOFT) 50 MG tablet Take 1 tablet (50 mg total) by mouth at bedtime.  30 tablet  2  . simethicone (MYLICON) 80 MG chewable tablet Chew 160 mg by mouth daily as needed. For flatulence       No current facility-administered medications on file prior to visit.   Social; patient lives with her daughter and son-in-law [place] the patient corrected me they actually live in her home. She has a cane and a walker although I'm not really clear whether she uses them. I am not completely certain about her functional level.  reports that she quit smoking about 25 years ago. Her smoking use included Cigarettes. She has a 47 pack-year smoking history. She has never used smokeless tobacco. She reports that she drinks alcohol. She reports that she does not use illicit drugs.  Review of systems Respiratory; no shortness of breath Cardiac no chest pain GI abdominal distention is listed as chronic. I am not really clear whether the patient is having diarrhea currently or not GU no dysuria  Physical examination Gen. somewhat frail woman, pale, however appears alert and is able to give most of her own history Respiratory; clear entry bilaterally Cardiac heart sounds are normal there is no murmurs JVP is elevated at 45 at otherwise she appears to be somewhat volume contracted Abdomen; this is distended bowel sounds intermittent. No liver spleen or masses are noted Extremities some degree of venous stasis with lower extremity edema Neurologic; there is no pronator drift in her upper extremities. She has  3/5 hip flexor and abduction Gait could not bring herself to a sitting position without assistance. She could not bear her own weight   Impression/plan #1 lactic acidosis I believe felt to be secondary to acute adrenal insufficiency. Felt to be resolved. I will repeat her chemistry #2 centrally mediated adrenal insufficiency on hydrocortisone replacement 10 mg in the morning and 5 in the afternoon. The exact etiology of this is  not completely clear although she follows with endocrinology #3 patient is on Zemplar for reasons that are not totally clear #4 diarrhea. I am not completely certain that all of this is resolved from talking to the patient. Exact etiology of this is not completely clear. She has a history of colon cancer and has had surgery. Patient is as far as I can tell essentially palliative care therefore she may not be a candidate for more aggressive investigation #5 severe protein calorie malnutrition with an albumin level of 1.9 #6 adult failure to thrive #7 significant proximal lower extremity weakness without other overt neurologic findings. I am assuming that this is mostly disuse #8 acute on chronic renal insufficiency. Chronic renal insufficiency late listed as stage III  She will need interventions with regards to her malnutrition. Aggressive physical therapy. I will repeat her electrolytes including her total CO2

## 2013-08-02 NOTE — Telephone Encounter (Signed)
[facility] 

## 2013-08-03 ENCOUNTER — Other Ambulatory Visit: Payer: Self-pay | Admitting: *Deleted

## 2013-08-03 MED ORDER — HYDROCODONE-ACETAMINOPHEN 5-325 MG PO TABS: ORAL_TABLET | ORAL | Status: DC

## 2013-08-03 NOTE — Telephone Encounter (Signed)
[facility] 

## 2013-08-03 NOTE — Discharge Summary (Signed)
Discussed with resident.

## 2013-08-04 ENCOUNTER — Non-Acute Institutional Stay (SKILLED_NURSING_FACILITY): Payer: Medicare Other | Admitting: Internal Medicine

## 2013-08-04 DIAGNOSIS — E872 Acidosis, unspecified: Secondary | ICD-10-CM

## 2013-08-04 DIAGNOSIS — R609 Edema, unspecified: Secondary | ICD-10-CM

## 2013-08-08 ENCOUNTER — Encounter: Payer: Self-pay | Admitting: Internal Medicine

## 2013-08-08 NOTE — Progress Notes (Signed)
Patient ID: Rachel Bridges, female   DOB: Sep 30, 1924, 80 y.o.   MRN: [id number]   This is an acute visit.  Level of care skilled.  Facility is Doctor, hospital complaint-acute visit secondary to edema.  History of present illness.  Patient is a pleasant 80 year old female recently hospitalized for chronic weakness diarrhea and severe protein calorie malnutrition-he was found to have a bicarbonate of 16 with anion gap of 15 and elevated and lactate acid level-she required an increase in her steroids secondary buried a history of adrenal insufficiency and apparently she improved.  She was noted to have chronic renal failure and severe calorie protein malnutrition with albumin of 1.9.  Her family is concerned saying she seems to have some increased edema and usually when she has this she usually gets some Lasix short-term.  Patient is not complaining of any shortness of breath or chest pain clinically she appears to be relatively stable with stable vital signs.  Family medical social history as been reviewed per admission note on 08/02/2013.  Medications have been reviewed per [facility]  Review of systems.  Gen. no fever chills or  Respiratory-does not complaining of shortness of breath or cough.  Cardiac no chest pain as edema lower extremity apparently this waxes and wanes.  GI does not complaining of abdominal pain nausea or vomiting.  Muscle skeletal as weakness but does not complaining of joint pain.  Neurologic no complaints of dizziness or headache or mental status changes.  Physical exam.  Temperature is 96.9 pulse 69 respirations 20 blood pressure 105/68.  In general this is a frail elderly female in no distress who is alert and pleasant.  Her skin is warm and dry.  Chest is clear to auscultation no labored breathing.  Heart is regular rate and rhythm without murmur gallop or rub.  She hasI would say 2+ lower extremity edema apparently she does have some  history of venous stasis here as well.  Abdomen is obese soft nontender with positive bowel sounds.  Muscle skeletal I did not note any deformities however she does have some weakness certainly of her lower extremities I suspect this is deconditioning.  Neurologic grossly intact no lateralizing findings cranial nerves intact her speech is clear.  Labs.  08/04/2013.  WBC 9.8 hemoglobin 11.5 platelets 2:30.  Sodium 141 potassium 4.2 BUN 30 creatinine 1.4.  Assessment and plan.  #1-lower extremity edema apparently per family this is increased some-with her metabolic issues would not want to be too aggressive here we'll give her Lasix 20 mg a day for 2 days as well as potassium 10 mEq a day for 2 days-updated basic metabolic panel stat on Friday, April 10.  Also monitor her weights closely daily notify provider again greater than 3 pounds.  Clinically she appears stable although her family  states she does have some increased edema from her baseline which happens on occasion according to family.  #2-history of lactacidosis thought to be secondary to acute adrenal insufficiency-clinically this appears to be stable  #3 protein calorie malnutrition with albumin of 1.9-she is on  dietary supplementation this certainly could contribute to her edema as well  [id number]  Of note greater than 25 minutes spent assessing patient-discussing family's concerns-reviewing her medical records-and coordinating plan of care-of note greater than 50% of time spent coordinating plan of care

## 2013-08-09 ENCOUNTER — Non-Acute Institutional Stay (SKILLED_NURSING_FACILITY): Payer: Medicare Other | Admitting: Internal Medicine

## 2013-08-09 DIAGNOSIS — R635 Abnormal weight gain: Secondary | ICD-10-CM

## 2013-08-09 DIAGNOSIS — R609 Edema, unspecified: Secondary | ICD-10-CM

## 2013-08-09 DIAGNOSIS — N183 Chronic kidney disease, stage 3 unspecified: Secondary | ICD-10-CM

## 2013-08-09 NOTE — Progress Notes (Signed)
Patient ID: Rachel Bridges, female   DOB: [birth year], 80 y.o.   MRN: [id number]   This is an acute visit.  Level of care skilled.  Facility is [facility], therapeutic complaint-acute visit secondary to weight gain .  History of present illness.  Patient is a pleasant 80 year old female recently hospitalized for chronic weakness diarrhea and severe protein calorie malnutrition-he was found to have a bicarbonate of 16 with anion gap of 15 and elevated and lactate acid level-she required an increase in her steroids secondary buried a history of adrenal insufficiency and apparently she improved.  She was noted to have chronic renal failure and severe calorie protein malnutrition with albumin of 1.9.   I saw her family last week and they reported some concern about her edema says she sometimes gets Lasix as needed.  She did receive 2 days of Lasix 20 mg a day along with potassium supplementation-I also ordered weights daily It appears the next day her weight was 129 which if scales were to be believed is about 13 pounds more than her admission weight 3 days previous. Since then her weight has been relatively stable it is 128 today and has been hovering in the higher 120s   \ She does not complaining of any increased shortness or breath rash I did talk to her nursing tech who states she does have some edema more so in her feet especially the right foot.  Family medical social history as been reviewed per admission note on 08/02/2013 .  Medications have been reviewed per [facility]   Review of systems.  Gen. no fever chills   Respiratory-does not complaining of shortness of breath or cough.  Cardiac no chest pain as edema lower extremities--somewhat more prominent in feet.  GI does not complaining of abdominal pain nausea or vomiting.  Muscle skeletal as weakness but does not complaining of joint pain.  Neurologic no complaints of dizziness or headache or mental status changes.   Physical exam.    T-. 97 1 pulse 60 respirations 20 blood pressure 112/68--weight is 128 pounds In general this is a frail elderly female in no distress who is alert and pleasant.  Her skin is warm and dry.  Chest is clear to auscultation no labored breathing.  Heart is regular rate and rhythm without murmur gallop or rub.  She hasI would say 2+ lower extremity edema apparently she does have some history of venous stasis here as well--she has her legs somewhat elevated and this again actually appears possibly a little better than when I saw last week  but is still persistent She does appear to have some increased edema of her right foot this is cool to touch pedal pulse is difficult to palpate but it is nontender and nonerythematous.  Abdomen is obese soft nontender with positive bowel sounds.  Muscle skeletal I did not note any deformities however she does have some weakness certainly of her lower extremities I suspect this is deconditioning.  Neurologic grossly intact no lateralizing findings cranial nerves intact her speech is clear .  Labs.  08/04/2013.  WBC 9.8 hemoglobin 11.5 platelets 2:30.  Sodium 141 potassium 4.2 BUN 30 creatinine 1.4 .  Assessment and plan.  #1-lower extremity edema--with some weight gain although this appears stable since late last week-she appeared to have tolerated the Lasix well-I did order update labs but did not see those results will reorder labs stat tomorrow morning will restart the Lasix 20 mg a day and potassium  10 mEq a day and updated metabolic panel as well first lab day next week.--She does have a history of chronic kidney disease we'll have to keep an eye on this  Continue to monitor weights daily  Also will write order to hold Lasix for systolic blood pressure less than 105   #2-right foot edema-will order a Doppler ultrasound to rule out DVT     #3-history of lactacidosis thought to be secondary to acute adrenal insufficiency-clinically this appears to be  stable--  #4 protein calorie malnutrition with albumin of 1.9-she is on dietary supplementation this certainly could contribute to her edema as well   [id number]

## 2013-08-10 ENCOUNTER — Emergency Department (HOSPITAL_COMMUNITY)
Admission: EM | Admit: 2013-08-10 | Discharge: 2013-08-10 | Disposition: A | Discharge status: Home / Self Care | Payer: Medicare Other | Attending: Emergency Medicine | Admitting: Emergency Medicine

## 2013-08-10 ENCOUNTER — Encounter (HOSPITAL_COMMUNITY): Payer: Self-pay | Admitting: Emergency Medicine

## 2013-08-10 ENCOUNTER — Other Ambulatory Visit: Payer: Self-pay | Admitting: Internal Medicine

## 2013-08-10 ENCOUNTER — Emergency Department (HOSPITAL_COMMUNITY): Payer: Medicare Other

## 2013-08-10 DIAGNOSIS — T07XXXA Unspecified multiple injuries, initial encounter: Secondary | ICD-10-CM

## 2013-08-10 DIAGNOSIS — I959 Hypotension, unspecified: Secondary | ICD-10-CM | POA: Insufficient documentation

## 2013-08-10 DIAGNOSIS — M949 Disorder of cartilage, unspecified: Secondary | ICD-10-CM

## 2013-08-10 DIAGNOSIS — D649 Anemia, unspecified: Secondary | ICD-10-CM | POA: Insufficient documentation

## 2013-08-10 DIAGNOSIS — IMO0002 Reserved for concepts with insufficient information to code with codable children: Secondary | ICD-10-CM | POA: Insufficient documentation

## 2013-08-10 DIAGNOSIS — Z66 Do not resuscitate: Secondary | ICD-10-CM | POA: Insufficient documentation

## 2013-08-10 DIAGNOSIS — S9000XA Contusion of unspecified ankle, initial encounter: Secondary | ICD-10-CM | POA: Insufficient documentation

## 2013-08-10 DIAGNOSIS — Y92009 Unspecified place in unspecified non-institutional (private) residence as the place of occurrence of the external cause: Secondary | ICD-10-CM | POA: Insufficient documentation

## 2013-08-10 DIAGNOSIS — Z8711 Personal history of peptic ulcer disease: Secondary | ICD-10-CM | POA: Insufficient documentation

## 2013-08-10 DIAGNOSIS — M899 Disorder of bone, unspecified: Secondary | ICD-10-CM | POA: Insufficient documentation

## 2013-08-10 DIAGNOSIS — E7800 Pure hypercholesterolemia, unspecified: Secondary | ICD-10-CM | POA: Insufficient documentation

## 2013-08-10 DIAGNOSIS — M129 Arthropathy, unspecified: Secondary | ICD-10-CM | POA: Insufficient documentation

## 2013-08-10 DIAGNOSIS — N184 Chronic kidney disease, stage 4 (severe): Secondary | ICD-10-CM | POA: Insufficient documentation

## 2013-08-10 DIAGNOSIS — F329 Major depressive disorder, single episode, unspecified: Secondary | ICD-10-CM | POA: Insufficient documentation

## 2013-08-10 DIAGNOSIS — Z91040 Latex allergy status: Secondary | ICD-10-CM | POA: Insufficient documentation

## 2013-08-10 DIAGNOSIS — S9030XA Contusion of unspecified foot, initial encounter: Secondary | ICD-10-CM | POA: Insufficient documentation

## 2013-08-10 DIAGNOSIS — Z79899 Other long term (current) drug therapy: Secondary | ICD-10-CM | POA: Insufficient documentation

## 2013-08-10 DIAGNOSIS — Z872 Personal history of diseases of the skin and subcutaneous tissue: Secondary | ICD-10-CM | POA: Insufficient documentation

## 2013-08-10 DIAGNOSIS — I129 Hypertensive chronic kidney disease with stage 1 through stage 4 chronic kidney disease, or unspecified chronic kidney disease: Secondary | ICD-10-CM | POA: Insufficient documentation

## 2013-08-10 DIAGNOSIS — Z8719 Personal history of other diseases of the digestive system: Secondary | ICD-10-CM | POA: Insufficient documentation

## 2013-08-10 DIAGNOSIS — Z85038 Personal history of other malignant neoplasm of large intestine: Secondary | ICD-10-CM | POA: Insufficient documentation

## 2013-08-10 DIAGNOSIS — Z8742 Personal history of other diseases of the female genital tract: Secondary | ICD-10-CM | POA: Insufficient documentation

## 2013-08-10 DIAGNOSIS — Z8781 Personal history of (healed) traumatic fracture: Secondary | ICD-10-CM | POA: Insufficient documentation

## 2013-08-10 DIAGNOSIS — Z96659 Presence of unspecified artificial knee joint: Secondary | ICD-10-CM | POA: Insufficient documentation

## 2013-08-10 DIAGNOSIS — F0390 Unspecified dementia without behavioral disturbance: Secondary | ICD-10-CM | POA: Insufficient documentation

## 2013-08-10 DIAGNOSIS — F3289 Other specified depressive episodes: Secondary | ICD-10-CM | POA: Insufficient documentation

## 2013-08-10 DIAGNOSIS — F411 Generalized anxiety disorder: Secondary | ICD-10-CM | POA: Insufficient documentation

## 2013-08-10 DIAGNOSIS — S20219A Contusion of unspecified front wall of thorax, initial encounter: Secondary | ICD-10-CM | POA: Insufficient documentation

## 2013-08-10 DIAGNOSIS — Z7902 Long term (current) use of antithrombotics/antiplatelets: Secondary | ICD-10-CM | POA: Insufficient documentation

## 2013-08-10 DIAGNOSIS — Y9389 Activity, other specified: Secondary | ICD-10-CM | POA: Insufficient documentation

## 2013-08-10 DIAGNOSIS — Z87891 Personal history of nicotine dependence: Secondary | ICD-10-CM | POA: Insufficient documentation

## 2013-08-10 LAB — CBC WITH DIFFERENTIAL/PLATELET
BASOS ABS: 0 10*3/uL (ref 0.0–0.1)
BASOS PCT: 0 % (ref 0–1)
Eosinophils Absolute: 0.1 10*3/uL (ref 0.0–0.7)
Eosinophils Relative: 1 % (ref 0–5)
HCT: 30.5 % — ABNORMAL LOW (ref 36.0–46.0)
HEMOGLOBIN: 9.7 g/dL — AB (ref 12.0–15.0)
Lymphocytes Relative: 23 % (ref 12–46)
Lymphs Abs: 1.3 10*3/uL (ref 0.7–4.0)
MCH: 30.1 pg (ref 26.0–34.0)
MCHC: 31.8 g/dL (ref 30.0–36.0)
MCV: 94.7 fL (ref 78.0–100.0)
MONOS PCT: 11 % (ref 3–12)
Monocytes Absolute: 0.6 10*3/uL (ref 0.1–1.0)
NEUTROS ABS: 3.5 10*3/uL (ref 1.7–7.7)
NEUTROS PCT: 65 % (ref 43–77)
PLATELETS: 151 10*3/uL (ref 150–400)
RBC: 3.22 MIL/uL — ABNORMAL LOW (ref 3.87–5.11)
RDW: 15.9 % — AB (ref 11.5–15.5)
WBC: 5.5 10*3/uL (ref 4.0–10.5)

## 2013-08-10 LAB — URINE MICROSCOPIC-ADD ON

## 2013-08-10 LAB — PROTIME-INR
INR: 0.86 (ref 0.00–1.49)
Prothrombin Time: 11.6 seconds (ref 11.6–15.2)

## 2013-08-10 LAB — URINALYSIS, ROUTINE W REFLEX MICROSCOPIC
Bilirubin Urine: NEGATIVE
Glucose, UA: NEGATIVE mg/dL
Hgb urine dipstick: NEGATIVE
KETONES UR: NEGATIVE mg/dL
NITRITE: NEGATIVE
PH: 5 (ref 5.0–8.0)
Protein, ur: NEGATIVE mg/dL
Specific Gravity, Urine: 1.016 (ref 1.005–1.030)
Urobilinogen, UA: 0.2 mg/dL (ref 0.0–1.0)

## 2013-08-10 LAB — COMPREHENSIVE METABOLIC PANEL
ALBUMIN: 1.7 g/dL — AB (ref 3.5–5.2)
ALK PHOS: 85 U/L (ref 39–117)
ALT: 12 U/L (ref 0–35)
AST: 12 U/L (ref 0–37)
BUN: 32 mg/dL — ABNORMAL HIGH (ref 6–23)
CO2: 24 mEq/L (ref 19–32)
Calcium: 7.5 mg/dL — ABNORMAL LOW (ref 8.4–10.5)
Chloride: 108 mEq/L (ref 96–112)
Creatinine, Ser: 1.13 mg/dL — ABNORMAL HIGH (ref 0.50–1.10)
GFR calc Af Amer: 50 mL/min — ABNORMAL LOW (ref >90)
GFR calc non Af Amer: 43 mL/min — ABNORMAL LOW (ref >90)
Glucose, Bld: 93 mg/dL (ref 70–99)
POTASSIUM: 4.2 meq/L (ref 3.7–5.3)
Sodium: 144 mEq/L (ref 137–147)
Total Bilirubin: <0.2 mg/dL — ABNORMAL LOW (ref 0.3–1.2)
Total Protein: 4.6 g/dL — ABNORMAL LOW (ref 6.0–8.3)

## 2013-08-10 MED ORDER — SODIUM CHLORIDE 0.9 % IV BOLUS (SEPSIS)
500.0000 mL | Freq: Once | INTRAVENOUS | Status: AC
  Administered 2013-08-10: 500 mL via INTRAVENOUS

## 2013-08-10 NOTE — ED Notes (Signed)
2 soft drinks with 2 cups of ice given to both daughters

## 2013-08-10 NOTE — ED Notes (Signed)
Phlebotomy notified unsuccessful at obtaining labs from IV start.

## 2013-08-10 NOTE — ED Notes (Signed)
PTAR picked up patient to return her to [facility]

## 2013-08-10 NOTE — ED Notes (Signed)
PTAR notified.  ?

## 2013-08-10 NOTE — ED Notes (Addendum)
Pt undressed, in gown, on continuous pulse oximetry and blood pressure cuff; family at bedside 

## 2013-08-10 NOTE — ED Notes (Signed)
Pt had a large bowel movement with urination; cleaned pt with warm water and toweled dried; changed stretcher linens and placed 2 chuks and a clean dry diaper on pt; family at bedside

## 2013-08-10 NOTE — Progress Notes (Signed)
I was asked to speak with Ms. Coffield's family due to confusion about whether or not she has a DVT.  Ultrasound done here at the facility was indeed positive for DVT.  Our on call provider ordered eliquis 2.5mg  for her.  She will need this for at least 6 mos.  She has had colon cancer, has been relatively immobile and had a recent fall all of which may have precipitated this clot.  She had been sent to the ED due to confusion.  I do not see any testing that would confirm or deny a DVT--xray showing soft tissue edema and CXR only.  She apparently has relatively low blood pressure normally as per her son in law with whom I spoke on the phone.  I confirmed with him that she DOES have a DVT and will need to be on eliquis for at least 6 mos.  He expressed understanding and was going to inform his wife, Karena Addison, about this.

## 2013-08-10 NOTE — ED Notes (Signed)
Pt returned from xray. Phlebotomy at bedside attempting to draw labs

## 2013-08-10 NOTE — ED Notes (Signed)
MD at bedside. 

## 2013-08-10 NOTE — ED Provider Notes (Addendum)
CSN: [id number]     Arrival date & time 08/10/13  1227 History   First MD Initiated Contact with Patient 08/10/13 1236     Chief Complaint  Patient presents with  . Hypotension     (Consider location/radiation/quality/duration/timing/severity/associated sxs/prior Treatment) HPI 80 year old female who is a resident of skilled nursing facility after recent admission for weakness. Bulk of the history is obtained from the patient's daughters. They state that they were called by the nursing home today and told that she had a low blood pressure and they were sent her to the emergency department. They also report that they were told that she could have a DVT of her right lower extremity and was started on a blood thinner. Daughter states that her blood pressure is chronically low and that her blood pressures have been normal. They state that there has been some increased swelling of her feet especially her right foot. The patient went home for Easter and fell getting back into the car. This was 8 days ago. They have noted a contusion of the chest wall and contusion of the right ankle with swelling since that time. However, the patient has been doing her therapy has not complained of any pain to them. Denied there has been any history of DVT and states that she has bilateral foot swelling although the right is greater than left after the fall. The patient is awake and alert and is also interactive but has some difficulty hearing. She states that she feels fine and has no other complaints here today. Daughters also state that during her recent admissions that they had spoken with hospice and were in the process of having the patient placed on hospice care. She has a DO NOT RESUSCITATE form which she has signed previously. Her daughter states that she does not want to have any aggressive care such as dialysis or feeding tubes placed. Past Medical History  Diagnosis Date  . MGUS (monoclonal gammopathy of unknown  significance)      Followed by Dr. Jamse Arn at [facility]  . Anxiety   . Depression   . Diverticulosis of colon   . Osteopenia   . Lumbar spinal stenosis   . Atrophic vaginitis   . History of tobacco abuse     at least 64 pack year. Started about age 7 and continued until about age 16.  Marland Kitchen Dermatitis   . Hip pain      History  . Actinic keratosis   . Dementia   . Adrenal suppression     central, on chronic steroids, followed by Dr. Buddy Duty of endocrinology  . Gluten intolerance     August 2011 endoscopy with biopsy negative for celiac disease. Prior to this patient had elevated TTG IgG of 93 (normal <20). Dr Collene Mares made clinical diagnosis of gluten intolerance based on diarrhea/symptoms from eating gluten. Needs GF diet.   Daughter tries to control diet.  . Shortness of breath     "sometimes; can happen at any time" (10/14/2012)  . Stress incontinence in female   . Subcapital fracture of left hip 11/13/2012    Dr Mardelle Matte  . History of high cholesterol   . Anemia   . History of blood transfusion     "S/P hip OR"  . History of stomach ulcers     "years ago" (07/28/2013)  . Arthritis     "right knee; back" (07/28/2013)  . Renal cyst   . Chronic kidney disease (CKD), stage IV (severe)   .  Colon cancer     History of bowel  obstruction. on treatment with Colace and MiraLax.  followed by Dr. Collene Mares and Dr. Barkley Bruns   Past Surgical History  Procedure Laterality Date  . Hemicolectomy Right   . Ovarian cyst removal    . Cataract extraction w/ intraocular lens  implant, bilateral Bilateral   . Knee arthroscopy Left   . Appendectomy    . Carpal tunnel release Left   . Lumbar laminectomy    . Lumbar fusion    . Replacement total knee Left   . Tonsillectomy  1930's  . Hip arthroplasty Left 11/13/2012    Procedure: ARTHROPLASTY BIPOLAR HIP;  Surgeon: Johnny Bridge, MD;  Location: [facility];  Service: Orthopedics;  Laterality: Left;  . Cholecystectomy    . Back surgery    . Dilation  and curettage of uterus      "lost a set of twins"   No family history on file. History  Substance Use Topics  . Smoking status: Former Smoker -- 1.00 packs/day for 47 years    Types: Cigarettes    Quit date: 08/01/1988  . Smokeless tobacco: Never Used  . Alcohol Use: Yes     Comment: 07/28/2013 "used to drink; been quit > 40 years"   OB History   Grav Para Term Preterm Abortions TAB SAB Ect Mult Living                 Review of Systems  All other systems reviewed and are negative.     Allergies  Oxycodone hcl; Ambien; Morphine and related; Pantoprazole; Lactose intolerance (gi); and Latex  Home Medications   Prior to Admission medications   Medication Sig Start Date End Date Taking? Authorizing Provider  acetaminophen (TYLENOL) 325 MG tablet Take 325 mg by mouth daily as needed. For pain   Yes Historical Provider, MD  apixaban (ELIQUIS) 2.5 MG TABS tablet Take 2.5 mg by mouth 2 (two) times daily.   Yes Historical Provider, MD  aspirin 81 MG tablet Take 81 mg by mouth daily.    Yes Historical Provider, MD  bisacodyl (DULCOLAX) 10 MG suppository Place 10 mg rectally daily as needed for mild constipation or moderate constipation.   Yes Historical Provider, MD  calcium citrate-vitamin D (CITRACAL+D) 315-200 MG-UNIT per tablet Take 1 tablet by mouth 2 (two) times daily. 07/30/13 01/06/15 Yes Joni Reining, DO  cyanocobalamin (,VITAMIN B-12,) 1000 MCG/ML injection Inject 1 mL (1,000 mcg total) into the muscle every 30 (thirty) days. 06/15/12  Yes Janell Quiet, MD  furosemide (LASIX) 20 MG tablet Take 20 mg by mouth daily.    Yes Historical Provider, MD  HYDROcodone-acetaminophen (NORCO) 5-325 MG per tablet Take two tablets by mouth every 6 hours as needed for severe pain 08/03/13  Yes Mahima Pandey, MD  hydrocortisone (CORTEF) 10 MG tablet Take 5-10 mg by mouth 2 (two) times daily. 10 mg in the a.m. And 5 mg in the afternoon.   Yes Historical Provider, MD  Magnesium Hydroxide (MILK OF MAGNESIA  PO) Take 30 mLs by mouth daily as needed (constipation).   Yes Historical Provider, MD  ondansetron (ZOFRAN) 4 MG tablet Take 1 tablet (4 mg total) by mouth daily as needed for nausea or vomiting. 06/30/13 06/30/14 Yes Joni Reining, DO  paricalcitol [facility]) 1 MCG capsule Take 1 mcg by mouth every Monday, Wednesday, and Friday.   Yes Historical Provider, MD  potassium chloride (KLOR-CON) 20 MEQ packet Take 10 mEq by mouth daily.  Yes Historical Provider, MD  sertraline (ZOLOFT) 25 MG tablet Take 1 tablet (25 mg total) by mouth every morning. 04/07/13  Yes Otho Bellows, MD  sertraline (ZOLOFT) 50 MG tablet Take 1 tablet (50 mg total) by mouth at bedtime. 06/03/13 06/03/14 Yes Rebecca Eaton, MD  simethicone (MYLICON) 80 MG chewable tablet Chew 160 mg by mouth daily as needed. For flatulence   Yes Historical Provider, MD  Sodium Phosphates (RA SALINE ENEMA RE) Place 1 enema rectally daily as needed (constipation).   Yes Historical Provider, MD  feeding supplement, ENSURE COMPLETE, (ENSURE COMPLETE) LIQD Take 237 mLs by mouth 2 (two) times daily between meals. 07/30/13   Joni Reining, DO   Filed Vitals:   08/10/13 1307 08/10/13 1354 08/10/13 1445 08/10/13 1514  BP:  105/59  116/59  Pulse:  68 65 60  Temp: 97.7 F (36.5 C)     TempSrc: Rectal     Resp:  14 15 17   SpO2:  100% 92% 98%    BP 116/59  Pulse 60  Temp(Src) 97.7 F (36.5 C) (Rectal)  Resp 17  SpO2 98% Physical Exam  Nursing note and vitals reviewed. Constitutional: She is oriented to person, place, and time. She appears well-developed and well-nourished.  HENT:  Head: Normocephalic and atraumatic.  Eyes: Conjunctivae and EOM are normal. Pupils are equal, round, and reactive to light.  Neck: Normal range of motion. Neck supple.  Cardiovascular: Normal rate, regular rhythm, normal heart sounds and intact distal pulses.   Pulmonary/Chest: Effort normal and breath sounds normal.  Contusion left side chest wall to back and flank.  No  ttp or crepitus.   Abdominal: Soft. Bowel sounds are normal. She exhibits no distension. There is no tenderness.  Musculoskeletal: Normal range of motion. She exhibits edema. She exhibits no tenderness.  Lateral right ankle contusion and foot discoloration.   Neurological: She is alert and oriented to person, place, and time. She has normal reflexes. No cranial nerve deficit. Coordination normal.  Skin: Skin is warm and dry.    ED Course  Procedures (including critical care time) Labs Review Labs Reviewed  URINALYSIS, ROUTINE W REFLEX MICROSCOPIC - Abnormal; Notable for the following:    Leukocytes, UA SMALL (*)    All other components within normal limits  CBC WITH DIFFERENTIAL - Abnormal; Notable for the following:    RBC 3.22 (*)    Hemoglobin 9.7 (*)    HCT 30.5 (*)    RDW 15.9 (*)    All other components within normal limits  COMPREHENSIVE METABOLIC PANEL - Abnormal; Notable for the following:    BUN 32 (*)    Creatinine, Ser 1.13 (*)    Calcium 7.5 (*)    Total Protein 4.6 (*)    Albumin 1.7 (*)    Total Bilirubin <0.2 (*)    GFR calc non Af Amer 43 (*)    GFR calc Af Amer 50 (*)    All other components within normal limits  URINE MICROSCOPIC-ADD ON - Abnormal; Notable for the following:    Squamous Epithelial / LPF MANY (*)    Casts HYALINE CASTS (*)    All other components within normal limits  PROTIME-INR    Imaging Review Dg Chest 1 View  08/10/2013   CLINICAL DATA:  Weakness, right ankle swelling  EXAM: CHEST - 1 VIEW  COMPARISON:  DG CHEST 1 VIEW dated 07/28/2013  FINDINGS: Stable enlarged cardiac silhouette. Lungs are hyperinflated. There is interstitial lung disease unchanged from  prior. No effusion, infiltrate, pneumothorax.  IMPRESSION: 1. No acute cardiopulmonary findings. 2. Emphysematous change similar prior.   Electronically Signed   By: Suzy Bouchard M.D.   On: 08/10/2013 13:48   Dg Ankle Complete Right  08/10/2013   CLINICAL DATA:  Right ankle  swelling, no known injury  EXAM: RIGHT ANKLE - COMPLETE 3+ VIEW  COMPARISON:  None.  FINDINGS: There is extensive subcutaneous edema surrounding the medial and lateral malleolus. Osteopenia noted. No evidence of cortical erosion. No evidence fracture. Degenerate spurring of the calcaneus.  IMPRESSION: Extensive soft tissue swelling about the ankles. No acute osseous abnormality.   Electronically Signed   By: Suzy Bouchard M.D.   On: 08/10/2013 13:56     EKG Interpretation   Date/Time:  Tuesday August 10 2013 13:21:12 EDT Ventricular Rate:  66 PR Interval:    QRS Duration: 118 QT Interval:  448 QTC Calculation: 469 R Axis:   -32 Text Interpretation:  Junctional rhythm Incomplete right bundle branch  block Low voltage, extremity leads       MDM   Final diagnoses:  None    1- hypotension Patient with documented prior hypotension which is stable and has improved here.   2- report of lower extremity swelling and concern for dvt- patient with bilateral swelling and right ankle contusion but does not have any other symptoms of dvt.  Reviewed note from nh yesterday and they plan doppler study.  Per family report they have started anticoagulants.  CXR clear and no evidence of heart failure.  3- hospice care/dnr  I have discussed care and plan with patient and both of her daughters. They do not wish aggressive care at this time and she will be discharged back to her nursing home. They have questions regarding the peripheral edema and whether or not she could have compression stockings placed. I've advised the these orders should come through her physician at the nursing home. They will address this upon their return.  Shaune Pollack, MD 08/10/13 [place], MD 08/10/13 [phone]

## 2013-08-10 NOTE — ED Notes (Signed)
Per EMS- Pt comes from [facility] home. Staff reports took BP 60/40. When EMS arrived BP 110/60. Pt reports she feels fine, denies pain. Is alert to baseline. Has known DVTs in BLE, also brusing to left side from fall last week. Staff reports she needs to take Eloquis. CBG 130.

## 2013-08-12 ENCOUNTER — Encounter: Payer: Self-pay | Admitting: Internal Medicine

## 2013-08-12 ENCOUNTER — Non-Acute Institutional Stay (SKILLED_NURSING_FACILITY): Payer: Medicare Other | Admitting: Internal Medicine

## 2013-08-12 DIAGNOSIS — I82409 Acute embolism and thrombosis of unspecified deep veins of unspecified lower extremity: Secondary | ICD-10-CM

## 2013-08-12 DIAGNOSIS — N183 Chronic kidney disease, stage 3 unspecified: Secondary | ICD-10-CM

## 2013-08-12 DIAGNOSIS — R609 Edema, unspecified: Secondary | ICD-10-CM

## 2013-08-12 NOTE — Progress Notes (Signed)
Patient ID: Rachel Bridges, female   DOB: 01-Mar-1925, 80 y.o.   MRN: [id number]   This is an acute visit.  Level of care skilled.  Facility Doctor, hospital complaint-acute visit followup right leg DVT.  History of present illness.  Patient is a pleasant 80 year old female with a complicated medical history including recent history of lactic acidosis-also protein calorie malnutrition with an albumin of 1.9 and persistent edema.  She was noted to have some increased edema of her right foot earlier this week and a venous Doppler did come back positive for a DVT of the distal segment of the right superficial femoral vein.  This was somewhat complicated by low blood pressures at times which actually prompted a visit to the ER-apparently she received IV fluids although she chronically does have somewhat low blood pressures and appears to tolerate this relatively asymptomatic.  She does have an order for Lasix if systolic is greater than 683 although according to nursing staff she gets this regularly anyway.  Currently patient is not complaining of any shortness of breath or chest pain or leg pain she has been started on Eliquist 2.5 mg twice a day for the DVT.  Family medical social history as been reviewed per admission note on 08/02/2013.  Medications have been reviewed per MAR.  Review of systems.  General no complaints of fever or chills.  Respiratory does not complaining of shortness of breath.  Cardiac no chest pain.  GI does not complaining of any nausea vomiting diarrhea or constipation.  Neurologic no headache or dizziness at this time.  Muscle skeletal-does not complaining of joint pain does have significant weakness however generalized.  Physical exam.  Temperature is 97.6 pulse 63 respirations 20 blood pressure 91/68.  In general this is a pleasant elderly female in no distress.  Chest is clear to auscultation no labored breathing.  Heart is regular rate  and rhythm without murmur gallop or rub she continues with 2+ lower extremity edema slightly increased more in the right foot.--This edema is cool to touch nontender pedal pulses difficult to feel on the right  Also has edema of her arms with some bruising which appears to be chronic  Muscle skeletal has generalized weakness especially lower extremities with her edema no deformities noted.  Neurologic is grossly intact although limited examination she's in bed cranial nerves intact her speech is clear.      Labs.  08/11/2013.  Sodium 140 potassium 4.3 BUN 29 creatinine 1.2.  08/10/2013.  ER values showed a hemoglobin of 9.7 white count 5.5 platelets 151.  Sodium 144 potassium 4.2 BUN 32 creatinine 1.13-liver function tests within normal limits.  Assessment and plan.  #1-history DVT right lower extremity-new problem-she appears to be stable clinically she has been started on Eliquis-  #2-lower extremity edema-this is complicated with her low blood pressures-at this point will hold Lasix and monitor patient's weights daily notify provider gain greater than 3 pounds--.  Also will update a metabolic panel on April 20 with her history of renal insufficiency  Challenging situation with the Lasix with her low blood pressures again I suspect we will be starting Lasix at least intermittently at some point  [id number]  .

## 2013-08-13 ENCOUNTER — Non-Acute Institutional Stay (SKILLED_NURSING_FACILITY): Payer: Medicare Other | Admitting: Internal Medicine

## 2013-08-13 DIAGNOSIS — I509 Heart failure, unspecified: Secondary | ICD-10-CM

## 2013-08-13 DIAGNOSIS — L03113 Cellulitis of right upper limb: Secondary | ICD-10-CM

## 2013-08-13 DIAGNOSIS — I5021 Acute systolic (congestive) heart failure: Secondary | ICD-10-CM

## 2013-08-13 DIAGNOSIS — N186 End stage renal disease: Secondary | ICD-10-CM

## 2013-08-13 DIAGNOSIS — IMO0002 Reserved for concepts with insufficient information to code with codable children: Secondary | ICD-10-CM

## 2013-08-13 NOTE — Progress Notes (Signed)
Patient ID: Rachel Bridges, female   DOB: 03/23/1925, 80 y.o.   MRN: [id number]  Location:  [facility] Provider:  Rexene Edison. Mariea Clonts, D.O., C.M.D.   Chief Complaint  Patient presents with  . Acute Visit    right arm red, sore with foul odor, also persistent peripheral edema    HPI:  80 yo female with h/o ESRD, adrenal insufficency on steroids whose diuretics are on hold was seen due to redness, pain, foul odor from left arm and peripheral edema.    I met with her daughter who was present and discussed a plan.  Goals of care are comfort measures.  Her family is considering hospice care.  Review of Systems:  Review of Systems  Constitutional: Negative for fever.  Eyes: Negative for blurred vision.  Respiratory: Positive for shortness of breath.   Cardiovascular: Positive for leg swelling. Negative for chest pain.  Gastrointestinal: Negative for abdominal pain.  Genitourinary: Negative for dysuria.  Musculoskeletal: Positive for myalgias.  Skin: Positive for rash.  Neurological: Negative for dizziness and headaches.  Endo/Heme/Allergies: Bruises/bleeds easily.  Psychiatric/Behavioral: Positive for memory loss.    Medications: Patient's Medications  New Prescriptions   No medications on file  Previous Medications   ACETAMINOPHEN (TYLENOL) 325 MG TABLET    Take 325 mg by mouth daily as needed. For pain   APIXABAN (ELIQUIS) 2.5 MG TABS TABLET    Take 2.5 mg by mouth 2 (two) times daily.   ASPIRIN 81 MG TABLET    Take 81 mg by mouth daily.    BISACODYL (DULCOLAX) 10 MG SUPPOSITORY    Place 10 mg rectally daily as needed for mild constipation or moderate constipation.   CALCIUM CITRATE-VITAMIN D (CITRACAL+D) 315-200 MG-UNIT PER TABLET    Take 1 tablet by mouth 2 (two) times daily.   CYANOCOBALAMIN (,VITAMIN B-12,) 1000 MCG/ML INJECTION    Inject 1 mL (1,000 mcg total) into the muscle every 30 (thirty) days.   FEEDING SUPPLEMENT, ENSURE COMPLETE, (ENSURE COMPLETE) LIQD    Take  237 mLs by mouth 2 (two) times daily between meals.   FUROSEMIDE (LASIX) 20 MG TABLET    Take 20 mg by mouth daily.    HYDROCODONE-ACETAMINOPHEN (NORCO) 5-325 MG PER TABLET    Take two tablets by mouth every 6 hours as needed for severe pain   HYDROCORTISONE (CORTEF) 10 MG TABLET    Take 5-10 mg by mouth 2 (two) times daily. 10 mg in the a.m. And 5 mg in the afternoon.   MAGNESIUM HYDROXIDE (MILK OF MAGNESIA PO)    Take 30 mLs by mouth daily as needed (constipation).   ONDANSETRON (ZOFRAN) 4 MG TABLET    Take 1 tablet (4 mg total) by mouth daily as needed for nausea or vomiting.   PARICALCITOL (ZEMPLAR) 1 MCG CAPSULE    Take 1 mcg by mouth every Monday, Wednesday, and Friday.   POTASSIUM CHLORIDE (KLOR-CON) 20 MEQ PACKET    Take 10 mEq by mouth daily.   SERTRALINE (ZOLOFT) 25 MG TABLET    Take 1 tablet (25 mg total) by mouth every morning.   SERTRALINE (ZOLOFT) 50 MG TABLET    Take 1 tablet (50 mg total) by mouth at bedtime.   SIMETHICONE (MYLICON) 80 MG CHEWABLE TABLET    Chew 160 mg by mouth daily as needed. For flatulence   SODIUM PHOSPHATES (RA SALINE ENEMA RE)    Place 1 enema rectally daily as needed (constipation).  Modified Medications   No medications  on file  Discontinued Medications   No medications on file    Physical Exam: Physical Exam  Constitutional:  Frail appearing white female  Cardiovascular: Normal rate, regular rhythm and normal heart sounds.   anasarca  Pulmonary/Chest: Effort normal and breath sounds normal.  Abdominal: Soft. Bowel sounds are normal.  Neurological: She is alert.  Skin:  Erythema, warmth, skin tears present on arm, foul smell present     Labs reviewed: Basic Metabolic Panel:  Recent Labs  10/14/12 2014  07/22/13 1519 07/23/13 0502  07/30/13 0550 07/31/13 1103 08/10/13 1408  NA  --   < > 142 142  < > 140 144 144  K  --   < > 3.2* 3.5*  < > 3.6* 4.1 4.2  CL  --   < > 110 112  < > 110 111 108  CO2  --   < > 16* 15*  < > 19 18* 24    GLUCOSE  --   < > 99 95  < > 100* 86 93  BUN  --   < > 33* 33*  < > 21 23 32*  CREATININE  --   < > 1.95* 1.74*  < > 1.32* 1.30* 1.13*  CALCIUM  --   < > 7.4* 7.8*  < > 7.4* 7.9* 7.5*  MG 1.5  < > 1.4* 2.2  --   --  1.5  --   PHOS 3.1  --   --   --   --   --   --   --   < > = values in this interval not displayed.  Liver Function Tests:  Recent Labs  07/21/13 1316 07/28/13 1248 08/10/13 1408  AST 34 17 12  ALT _0 ALKPHOS 165* 116 85  BILITOT 0.2* 0.2* <0.2*  PROT 6.4 5.5* 4.6*  ALBUMIN 2.2* 1.9* 1.7*    CBC:  Recent Labs  07/21/13 1316  07/28/13 1248 07/30/13 0550 08/10/13 1408  WBC 7.9  < > 12.6* 6.4 5.5  NEUTROABS 4.4  --  10.7*  --  3.5  HGB 14.3  < > 12.1 10.5* 9.7*  HCT 42.0  < > 36.9 31.4* 30.5*  MCV 90.3  < > 92.0 90.8 94.7  PLT 324  < > 218 181 151  < > = values in this interval not displayed.  Assessment/Plan 1. Cellulitis of right arm -will treat with keflex 271m po qid for 7 days and also florastor 2589mpo qid for 10 days; lorazepam for anxiety--is trying to tear open skin  2. Acute systolic congestive heart failure -will give torsemide 2032m one over the weekend if she goes into chf--family wants to be asked about whether to send her to the hospital, but agrees that comfort is goal  3. ESRD (end stage renal disease) -diuretics have been on hold due to this but now with anasarca  Family/ staff Communication: discussed with her daughter who was present  Goals of care: comfort measures, hospice

## 2013-08-15 DIAGNOSIS — I82409 Acute embolism and thrombosis of unspecified deep veins of unspecified lower extremity: Secondary | ICD-10-CM | POA: Insufficient documentation

## 2013-08-16 ENCOUNTER — Other Ambulatory Visit: Payer: Self-pay | Admitting: *Deleted

## 2013-08-16 MED ORDER — LORAZEPAM 0.5 MG PO TABS: ORAL_TABLET | ORAL | Status: AC

## 2013-08-16 NOTE — Telephone Encounter (Signed)
[facility] 

## 2013-08-18 ENCOUNTER — Non-Acute Institutional Stay (SKILLED_NURSING_FACILITY): Payer: Medicare Other | Admitting: Internal Medicine

## 2013-08-18 ENCOUNTER — Encounter: Payer: Self-pay | Admitting: Internal Medicine

## 2013-08-18 DIAGNOSIS — N183 Chronic kidney disease, stage 3 unspecified: Secondary | ICD-10-CM

## 2013-08-18 DIAGNOSIS — E2749 Other adrenocortical insufficiency: Secondary | ICD-10-CM

## 2013-08-18 DIAGNOSIS — D649 Anemia, unspecified: Secondary | ICD-10-CM

## 2013-08-18 DIAGNOSIS — E2740 Unspecified adrenocortical insufficiency: Secondary | ICD-10-CM

## 2013-08-18 DIAGNOSIS — R609 Edema, unspecified: Secondary | ICD-10-CM

## 2013-08-18 NOTE — Progress Notes (Signed)
Patient ID: Rachel Bridges, female   DOB: [birth year], 80 y.o.   MRN: [id number]      Facility; [facility].  Chief complaint-discharge note   History; this is an 80 year old lady who was admitted with chronic weakness, and mobility, diarrhea and severe protein calorie malnutrition. She was found to have a bicarbonate of 16 with an anion gap of 15 and an elevated lactic acid level. She required an increase in her usual steroid dose brackets adrenal insufficiency] and according to the notes she did some better with this. I note that she was seen by the palliative care team and apparently has been seen by them in the past--she actually will be going home and followed by hospice In the hospital. She was noted to have chronic renal failure, severe protein calorie malnutrition with an albumin level of 1.9. She was rehydrated. Her stay here as been somewhat complicated by edema however she appears not to have tolerated the Lasix real well with her blood pressure her blood pressures appear to run largely in the 64P  systolic-although she doesn't appear to be overtly symptomatic-she is largely bedbound.  She also was diagnosed with a right lower extremity DVT and has been started on Eliquis--this appears to be relatively stable.  She also is completing a course of Keflex for suspected right arm cellulitis and this appears to be improving as well.  She will be going home with family who is very supportive and again will be under hospice services with I suspect emphasis on comfort-she will have home health support and would certainly warrant this  Family medical social history reviewed per progress note on 08/02/2013.  Other diagnoses include anxiety depression lumbar spinal disease again she does have a history of adrenal insufficiency lactic acidosis.  Of note also a history of colon cancer in the past   Medications have been reviewed per [facility]  include Keflex 250 mg 4 times a  day until April 24.  Aspirin 81 mg daily.  Cortef 10 mg every morning and 5 mg every afternoon.  Zoloft 25 mg every morning.  Calcium with vitamin D twice a day.  Elliquis was 2.5 mg twice a day.  Ativan 0.5 mg every 6 hours when necessary   Review of systems.  In general denies any fever or chills.  Respiratory does not complain of any shortness of breath or cough.  Cardiac no chest pain continues to have some lower extremity edema which appears to be chronic.  GI does not complaining of any abdominal pain or discomfort says she eats pretty well no nausea or vomiting or diarrhea noted recently.  Muscle skeletal does not complaining of joint pain.  Neurologic no complaints of headache dizziness or syncopal-type feelings.  Psych she does have some history of dementia depression anxiety this appears relatively stable.  Physical exam.  Temperature is 97.6 pulse 68 respirations 18 blood pressure 98/56 O2 saturation is in the 90s.  In general this is a pleasant frail elderly female in no distress resting comfortably in bed.  Skin is warm and dry I do note on her right arm there are 2 covered areas it appears there is some pale erythematous scaling around her elbow area apparently this is cellulitis that is resolving there is no active drainage that I can see this is followed by wound care.  Oropharynx is clear mucous membranes moist.  Chest is clear to auscultation with somewhat poor respiratory effort no labored breathing.  Heart is  regular rate and rhythm without murmur gallop or rub she has trace to 1+ edema of her legs I would say one to 2+ edema of her right foot there is a faint pedal pulse it is not tender mass in his school erythema.  Abdomen is distended soft nontender appears to be baseline exam.  Muscle skeletal has significant lower extremity weakness and general frailty this is her baseline.  Neurologic is grossly intact her speech is clear no lateralizing  findings.  Psych she is oriented to self is pleasant and appropriate but does have confusion at times again a very pleasant individual.  Labs.  08/16/2013.  Sodium 139 potassium 5.1 BUN 29 creatinine 1.2.   08/04/2013.  WBC 9.8 hemoglobin 11.5 platelets 232.  Lab done earlier this week showed a hemoglobin of 8.5.  Marland Kitchen  Assessment and plan.  #1 history of lactic acidosis thought secondary to adrenal insufficiency-with history of diarrhea-diarrhea appears to have resolved she is on Cortef for her adrenal insufficiency-this appears relatively stable although continues to be a very frail individual again she will be under hospice services with emphasis on comfort care at home.  #2-history of right leg DVT she is on anticoagulation--Eliquis--this appears to be relatively stable this will have to be monitored of course by home health as well as hospice.  #3-edema this is somewhat challenging with her history of low blood pressures currently off Lasix again this will have to be monitored clinically she appears stable without any complaints of shortness of breath.  #4-history of upper extremity cellulitis this appears to be resolving she is completing a course of Keflex will need followup certainly in the home setting.  #5 past history of protein calorie malnutrition with albumin of [id number] certainly could be contributing to her edema-she does continue on Ensure supplementation again this will have to be addressed certainly in the home setting as well with followup by her primary care provider.  #6-depression this appears stable on Zoloft she appears to be in good spirits tonight.  #7-history of anxiety-she is on Ativan every 6 hours when necessary this evening this appears to be stable as well and I have not really seen her when she was overtly anxious but apparently there are periods of this.  #8 past history of anemia -- hemoglobin appears to have dipped a bit on lab done this week which  showed a hemoglobin of 8.5-again no aggressive intervention is desired clinically she appears stable will update CBC on Monday, April 27 home health to draw this. Notify primary care provider of results  #9-history of chronic renal insufficiency-again this appears to have been stable Will redraw the lab again on Monday as well with primary care provider notified of results  She will be discharged home with home health support which certainly will be necessary to follow her multiple medical issues including protein calorie malnutrition recent DVT history of edema and chronic renal insufficiency.  She will be under hospice services as well.  She will be with family which she had lived with previously and they appear to be quite supportive.  [id number] note greater than 30 minutes spent on this discharge summary-greater than 50% of time spent coordinating plan of care for numerous diagnoses                                          .  Diverticulosis of colon     .  Osteopenia     .  Lumbar spinal stenosis     .  Atrophic vaginitis     .  History of tobacco abuse       at least 64 pack year. Started about age 75 and continued until about age 53.    Marland Kitchen  Dermatitis     .  Hip pain       History    .  Actinic keratosis     .  Dementia     .  Adrenal suppression       central, on chronic steroids, followed by Dr. Sharl Ma of endocrinology    .  Gluten intolerance       August 2011 endoscopy with biopsy negative for celiac disease. Prior to this patient had elevated TTG IgG of 93 (normal <20). Dr Loreta Ave made clinical diagnosis of gluten intolerance based on diarrhea/symptoms from eating gluten. Needs GF diet. Daughter tries to control diet.    .  Shortness of breath       "sometimes; can happen at any time" (10/14/2012)    .  Stress incontinence in female     .  Subcapital fracture of left hip  11/13/2012      Dr Dion Saucier    .  History of high cholesterol     .  Anemia     .  History of  blood transfusion       "S/P hip OR"    .  History of stomach ulcers       "years ago" (07/28/2013)    .  Arthritis       "right knee; back" (07/28/2013)    .  Renal cyst     .  Chronic kidney disease (CKD), stage IV (severe)     .  Colon cancer       History of bowel obstruction. on treatment with Colace and MiraLax. followed by Dr. Loreta Ave and Dr. Purnell Shoemaker        Past Surgical History     Procedure  Laterality  Date     .  Hemicolectomy  Right      .  Ovarian cyst removal       .  Cataract extraction w/ intraocular lens implant, bilateral  Bilateral      .  Knee arthroscopy  Left      .  Appendectomy       .  Carpal tunnel release  Left      .  Lumbar laminectomy       .  Lumbar fusion       .  Replacement total knee  Left      .  Tonsillectomy   1930's     .  Hip arthroplasty  Left  11/13/2012       Procedure: ARTHROPLASTY BIPOLAR HIP; Surgeon: Eulas Post, MD; Location: [facility] OR; Service: Orthopedics; Laterality: Left;     .  Cholecystectomy       .  Back surgery       .  Dilation and curettage of uterus         "lost a set of twins"     Current Outpatient Prescriptions on File Prior to Visit     Medication  Sig  Dispense  Refill     .  acetaminophen (TYLENOL) 325 MG tablet  Take 325 mg by mouth daily as needed. For pain       .  aspirin 81 MG tablet  Take 81 mg by mouth daily.       .  calcium citrate-vitamin D (CITRACAL+D) 315-200 MG-UNIT per tablet  Take 1 tablet by mouth 2 (two) times daily.  60 tablet  11     .  cyanocobalamin (,VITAMIN B-12,) 1000 MCG/ML injection  Inject 1 mL (1,000 mcg total) into the muscle every 30 (thirty) days.  1 mL  11     .  feeding supplement, ENSURE COMPLETE, (ENSURE COMPLETE) LIQD  Take 237 mLs by mouth 2 (two) times daily between meals.       .  furosemide (LASIX) 20 MG tablet  Take 20 mg by mouth daily as needed for fluid or edema.       Marland Kitchen  HYDROcodone-acetaminophen (NORCO) 5-325 MG per tablet  Take one tablet by mouth at bedtime as needed for  moderate pain; Take two tablets by mouth at bedtime as needed for moderate pain  60 tablet  0     .  hydrocortisone (CORTEF) 10 MG tablet  Take 5-10 mg by mouth 2 (two) times daily. 10 mg in the a.m. And 5 mg in the afternoon.       .  ondansetron (ZOFRAN) 4 MG tablet  Take 1 tablet (4 mg total) by mouth daily as needed for nausea or vomiting.  30 tablet  1     .  paricalcitol (ZEMPLAR) 1 MCG capsule  Take 1 mcg by mouth every Monday, Wednesday, and Friday.       .  sertraline (ZOLOFT) 25 MG tablet  Take 1 tablet (25 mg total) by mouth every morning.  30 tablet  6     .  sertraline (ZOLOFT) 50 MG tablet  Take 1 tablet (50 mg total) by mouth at bedtime.  30 tablet  2     .  simethicone (MYLICON) 80 MG chewable tablet  Chew 160 mg by mouth daily as needed. For flatulence       No current facility-administered medications on file prior to visit.      Social; patient lives with her daughter and son-in-law [place] the patient corrected me they actually live in her home. She has a cane and a walker although I'm not really clear whether she uses them. I am not completely certain about her functional level.  reports that she quit smoking about 25 years ago. Her smoking use included Cigarettes. She has a 47 pack-year smoking history. She has never used smokeless tobacco. She reports that she drinks alcohol. She reports that she does not use illicit drugs.  Review of systems  Respiratory; no shortness of breath  Cardiac no chest pain  GI abdominal distention is listed as chronic. I am not really clear whether the patient is having diarrhea currently or not  GU no dysuria  Physical examination  Gen. somewhat frail woman, pale, however appears alert and is able to give most of her own history  Respiratory; clear entry bilaterally  Cardiac heart sounds are normal there is no murmurs JVP is elevated at 45 at otherwise she appears to be somewhat volume contracted  Abdomen; this is distended bowel sounds  intermittent. No liver spleen or masses are noted  Extremities some degree of venous stasis with lower extremity edema  Neurologic; there is no pronator drift in her upper extremities. She has 3/5 hip flexor and abduction  Gait could not bring herself to a sitting position without assistance. She could not bear her own weight  Impression/plan  #1 lactic acidosis  I believe felt to be secondary to acute adrenal insufficiency. Felt to be resolved. I will repeat her chemistry  #2 centrally mediated adrenal insufficiency on hydrocortisone replacement 10 mg in the morning and 5 in the afternoon. The exact etiology of this is not completely clear although she follows with endocrinology  #3 patient is on Zemplar for reasons that are not totally clear  #4 diarrhea. I am not completely certain that all of this is resolved from talking to the patient. Exact etiology of this is not completely clear. She has a history of colon cancer and has had surgery. Patient is as far as I can tell essentially palliative care therefore she may not be a candidate for more aggressive investigation  #5 severe protein calorie malnutrition with an albumin level of 1.9  #6 adult failure to thrive  #7 significant proximal lower extremity weakness without other overt neurologic findings. I am assuming that this is mostly disuse  #8 acute on chronic renal insufficiency. Chronic renal insufficiency late listed as stage III  She will need interventions with regards to her malnutrition. Aggressive physical therapy. I will repeat her electrolytes including her total CO2                Not recorded

## 2013-08-20 ENCOUNTER — Other Ambulatory Visit: Payer: Self-pay | Admitting: Internal Medicine

## 2013-08-27 IMAGING — CR DG CHEST 2V
1 series · 1 of 1 positions shown · non-contrast
Comparison: Radiographs 08/12/2011 and 04/10/2011.  CT 03/05/2011.

CLINICAL DATA: Cough.

CHEST - 2 VIEW

[w chest lat]
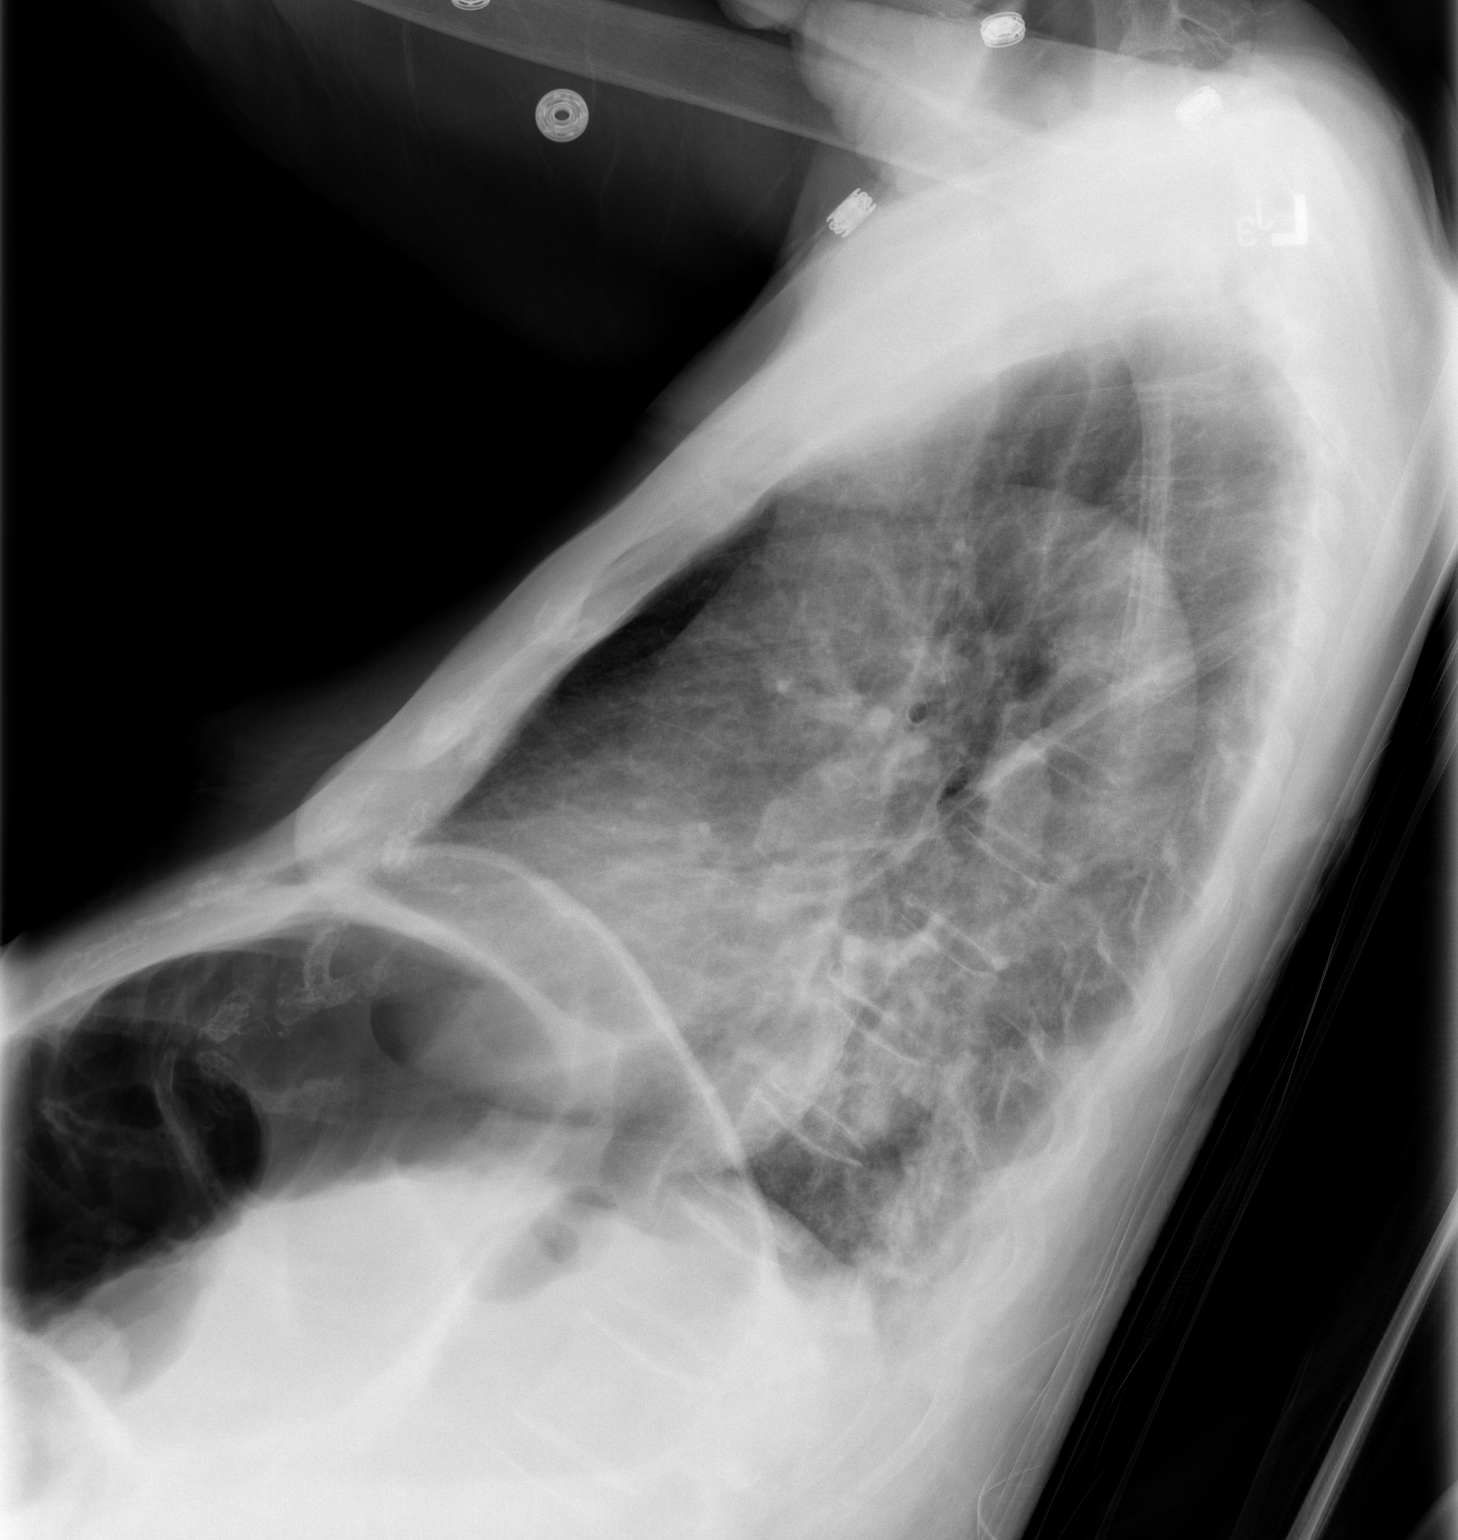

[1 of 1 positions shown; findings below may reference images not displayed]

FINDINGS: The heart size and mediastinal contours are stable.
There is chronic elevation of the right hemidiaphragm and colonic
interposition.  There are increased markings of both lung bases
which have progressed compared with the prior examination.  No
consolidation or significant pleural effusion is seen.  The osseous
structures appear unchanged.
IMPRESSION: Mildly progressive interstitial prominence at both lung bases
suggesting edema or possible bronchiolitis.  No focal airspace
disease.

## 2013-08-31 ENCOUNTER — Encounter: Payer: Self-pay | Admitting: Internal Medicine

## 2013-08-31 ENCOUNTER — Ambulatory Visit (INDEPENDENT_AMBULATORY_CARE_PROVIDER_SITE_OTHER): Payer: Medicare Other | Admitting: Internal Medicine

## 2013-08-31 VITALS — BP 83/62 | HR 76 | Temp 96.1°F | Resp 20 | Ht 61.5 in | Wt 123.4 lb

## 2013-08-31 DIAGNOSIS — Z9181 History of falling: Secondary | ICD-10-CM

## 2013-08-31 DIAGNOSIS — E213 Hyperparathyroidism, unspecified: Secondary | ICD-10-CM | POA: Insufficient documentation

## 2013-08-31 DIAGNOSIS — R296 Repeated falls: Secondary | ICD-10-CM

## 2013-08-31 DIAGNOSIS — N183 Chronic kidney disease, stage 3 unspecified: Secondary | ICD-10-CM

## 2013-08-31 DIAGNOSIS — E878 Other disorders of electrolyte and fluid balance, not elsewhere classified: Secondary | ICD-10-CM

## 2013-08-31 DIAGNOSIS — X58XXXA Exposure to other specified factors, initial encounter: Secondary | ICD-10-CM

## 2013-08-31 DIAGNOSIS — E43 Unspecified severe protein-calorie malnutrition: Secondary | ICD-10-CM

## 2013-08-31 DIAGNOSIS — T148XXA Other injury of unspecified body region, initial encounter: Secondary | ICD-10-CM

## 2013-08-31 DIAGNOSIS — E876 Hypokalemia: Secondary | ICD-10-CM

## 2013-08-31 DIAGNOSIS — R609 Edema, unspecified: Secondary | ICD-10-CM

## 2013-08-31 DIAGNOSIS — E2740 Unspecified adrenocortical insufficiency: Secondary | ICD-10-CM

## 2013-08-31 DIAGNOSIS — D649 Anemia, unspecified: Secondary | ICD-10-CM

## 2013-08-31 DIAGNOSIS — E2749 Other adrenocortical insufficiency: Secondary | ICD-10-CM

## 2013-08-31 LAB — BASIC METABOLIC PANEL WITH GFR
BUN: 18 mg/dL (ref 6–23)
CALCIUM: 8.1 mg/dL — AB (ref 8.4–10.5)
CO2: 20 mEq/L (ref 19–32)
Chloride: 114 mEq/L — ABNORMAL HIGH (ref 96–112)
Creat: 1.25 mg/dL — ABNORMAL HIGH (ref 0.50–1.10)
GFR, EST AFRICAN AMERICAN: 45 mL/min — AB
GFR, EST NON AFRICAN AMERICAN: 39 mL/min — AB
Glucose, Bld: 102 mg/dL — ABNORMAL HIGH (ref 70–99)
POTASSIUM: 3.9 meq/L (ref 3.5–5.3)
SODIUM: 149 meq/L — AB (ref 135–145)

## 2013-08-31 MED ORDER — FUROSEMIDE 20 MG PO TABS: 20.0000 mg | ORAL_TABLET | Freq: Every day | ORAL | Status: DC

## 2013-08-31 MED ORDER — HYDROCODONE-ACETAMINOPHEN 5-325 MG PO TABS: 1.0000 | ORAL_TABLET | Freq: Four times a day (QID) | ORAL | Status: AC | PRN

## 2013-08-31 MED ORDER — FUROSEMIDE 20 MG PO TABS: 10.0000 mg | ORAL_TABLET | Freq: Every day | ORAL | Status: DC

## 2013-08-31 MED ORDER — POTASSIUM CHLORIDE ER 20 MEQ PO TBCR: 10.0000 meq | EXTENDED_RELEASE_TABLET | Freq: Every day | ORAL | Status: AC

## 2013-08-31 MED ORDER — PARICALCITOL 1 MCG PO CAPS: 1.0000 ug | ORAL_CAPSULE | Freq: Every day | ORAL | Status: AC

## 2013-08-31 NOTE — Assessment & Plan Note (Signed)
These are from falling. Home hospice nursing is caring for the patient at home and is monitoring these wounds. Some bleeding on the left forearm, likely worsened by the Eliquis.

## 2013-08-31 NOTE — Assessment & Plan Note (Addendum)
Pt on Lasix which was stopped in Rehab. Potassium 4.2 at the time. She was taking 57mEq BID, per her daughter.  Restarting Lasix at 10mg  daily today 2/2 peripheral edema. K is 3.9 today. Restarting 59mEq KCl daily.  - Recheck BMP next week

## 2013-08-31 NOTE — Assessment & Plan Note (Addendum)
No PTH on file, but pt on paricalcitol "for her kidneys" per her daughter. Calcium is chronically low, today at 8.1. Checking PTH and continuing the paricalcitol.

## 2013-08-31 NOTE — Assessment & Plan Note (Addendum)
She was being seen by Dr. Buddy Duty, but has not seen him in a while due to billing issues. She is on hydrocortisone 10mg  qam and 5mg  qhs. Her BP in the clinic today is on the lower end of normal for her at 83/62. We may need to increase her steroid. With her peripheral edema, I am restarting her Lasix but at a low dose, 10mg  daily.  - She is to return to the clinic next week to assess her BP on the Lasix.  - A referral to return to Dr. Buddy Duty has been placed.  - Continuing the hydrocortisone at her home doses.

## 2013-08-31 NOTE — Assessment & Plan Note (Signed)
Pt with worsening renal function in April while in the hospital and rehab. Cr as high as 1.77 on 4/1. Today it is 1.25, within her baseline of 1-1.3. Restarting Lasix at low dose. Will need to monitor renal function.

## 2013-08-31 NOTE — Assessment & Plan Note (Addendum)
Per daughter, pt with frequent falls. She has not hit her head and has no LOC. She does have multiple bruises from her falls. Her daughter states that the pt is using a walker. She was told by the hospice nurse to keep her mother in bed to help prevent falls. I told the patient and her daughter that the best thing right now, since Mrs. Niesen is so unsteady on her feet, is to support her if she tries to ambulate. However, keeping her from ambulating will be the safest thing for the patient. She already has multiple bruises on her extremities from falling, and is on anticoagulation. Next week, her bruises will need to be reassessed to see if they have worsened, esp the bruise on the dorsum of the right foot- daughter states that it has improved. If she continues to fall, we will need to discontinue the anticoagulation.

## 2013-08-31 NOTE — Progress Notes (Signed)
Patient ID: Rachel Bridges, female   DOB: 1924/05/25, 80 y.o.   MRN: [id number]  Subjective:   Patient ID: Rachel Bridges female   DOB: 1924-12-25 80 y.o.   MRN: [id number]  HPI: [name] is a 80 y.o. F w/ PMH dementia, CKD stage 3, MGUS, Colon CA, Osteopenia, left hip fracture in July 2014, gluten intolerance, recurrent SBO, GERD, adrenal insufficiency, and protein malnutrition who presents w/ daughter for a f/u after being discharged from [facility] on 4/24 to home hospice care.   She was found to have a new DVT while in Rehab and was started on apixaban on 08/10/13. She has multiple bruises on her extremities.   The Lasix was stopped in Rehab due to her renal function and her daughter wonders if she needs to take the supplemental potassium since she is off the Lasix. Her last BMP was 4/14 and her potassium was 4.2 with improved renal function with Cr 1.13 from 1.3 on 4/4.   The patient's daughter states that Rachel Bridges has had frequent falls even with a family member present. She has not hit her head, but does have multiple scattered bruises on her extremities. She was told by the hospice nurse to keep the patient in the bed.   Her weight is stable. Her daughter states that she is eating vigorously at home. She is still having diarrhea. They are trying to avoid gluten to reduce the diarrhea.    Past Medical History  Diagnosis Date  . MGUS (monoclonal gammopathy of unknown significance)      Followed by Dr. Jamse Arn at [facility] [place]  . Anxiety   . Depression   . Diverticulosis of colon   . Osteopenia   . Lumbar spinal stenosis   . Atrophic vaginitis   . History of tobacco abuse     at least 64 pack year. Started about age 85 and continued until about age 45.  Marland Kitchen Dermatitis   . Hip pain      History  . Actinic keratosis   . Dementia   . Adrenal suppression     central, on chronic steroids, followed by Dr. Buddy Duty of endocrinology  . Gluten intolerance     August 2011 endoscopy with biopsy negative for celiac disease. Prior to this patient had elevated TTG IgG of 93 (normal <20). Dr Collene Mares made clinical diagnosis of gluten intolerance based on diarrhea/symptoms from eating gluten. Needs GF diet.   Daughter tries to control diet.  . Shortness of breath     "sometimes; can happen at any time" (10/14/2012)  . Stress incontinence in female   . Subcapital fracture of left hip 11/13/2012    Dr Mardelle Matte  . History of high cholesterol   . Anemia   . History of blood transfusion     "S/P hip OR"  . History of stomach ulcers     "years ago" (07/28/2013)  . Arthritis     "right knee; back" (07/28/2013)  . Renal cyst   . Chronic kidney disease (CKD), stage IV (severe)   . Colon cancer     History of bowel  obstruction. on treatment with Colace and MiraLax.  followed by Dr. Collene Mares and Dr. Barkley Bruns   Current Outpatient Prescriptions  Medication Sig Dispense Refill  . acetaminophen (TYLENOL) 325 MG tablet Take 325 mg by mouth daily as needed. For pain      . apixaban (ELIQUIS) 2.5 MG TABS tablet Take 2.5 mg by mouth 2 (two) times daily.      Marland Kitchen  aspirin 81 MG tablet Take 81 mg by mouth daily.       . bisacodyl (DULCOLAX) 10 MG suppository Place 10 mg rectally daily as needed for mild constipation or moderate constipation.      . calcium citrate-vitamin D (CITRACAL+D) 315-200 MG-UNIT per tablet Take 1 tablet by mouth 2 (two) times daily.  60 tablet  11  . feeding supplement, ENSURE COMPLETE, (ENSURE COMPLETE) LIQD Take 237 mLs by mouth 2 (two) times daily between meals.      . hydrocortisone (CORTEF) 10 MG tablet Take 5-10 mg by mouth 2 (two) times daily. 10 mg in the a.m. And 5 mg in the afternoon.      Marland Kitchen LORazepam (ATIVAN) 0.5 MG tablet Take one tablet by mouth every 6 hours as needed for anxiety  120 tablet  5  . sertraline (ZOLOFT) 25 MG tablet Take 1 tablet (25 mg total) by mouth every morning.  30 tablet  6  . Sodium Phosphates (RA SALINE ENEMA RE) Place 1  enema rectally daily as needed (constipation).       No current facility-administered medications for this visit.   No family history on file. History   Social History  . Marital Status: Widowed    Spouse Name: N/A    Number of Children: N/A  . Years of Education: N/A   Social History Main Topics  . Smoking status: Former Smoker -- 1.00 packs/day for 47 years    Types: Cigarettes    Start date: 09/01/1987    Quit date: 08/01/1988  . Smokeless tobacco: Never Used  . Alcohol Use: Yes     Comment: 07/28/2013 "used to drink; been quit > 40 years"  . Drug Use: No  . Sexual Activity: No   Other Topics Concern  . Not on file   Social History Narrative    Husband died of myeloma.  Patient lives with daughter and son in [place], with [facility] services.   Review of Systems: Constitutional: Denies fever, chills. +fatigue. +increased appetite Respiratory: Denies SOB, DOE  Cardiovascular: Denies chest pain. + leg swelling.  Gastrointestinal: Denies nausea, vomiting, abdominal pain. +diarrhea Genitourinary: Denies dysuria, urgency Musculoskeletal: +unsteady on feet  Skin: Multiple skin tears with scattered bruising.  Neurological: Denies syncope. +weakness.  Hematological: +Easy bruising on anticoagulation Psychiatric/Behavioral: Denies suicidal ideation, mood changes. +intermittent confusion  Objective:  Physical Exam: Filed Vitals:   08/31/13 1036  BP: 83/62  Pulse: 76  Temp: 96.1 F (35.6 C)  TempSrc: Oral  Resp: 20  Height: 5' 1.5" (1.562 m)  Weight: 123 lb 6.4 oz (55.974 kg)  SpO2: 96%   Constitutional: Vital signs reviewed.  Patient is a chronically ill appearing female in no acute distress and cooperative with exam.   Head: Normocephalic and atraumatic Eyes: PERRL, EOMI Neck: Supple, Trachea midline Cardiovascular: RRR, no MRG, radial pulses symmetric and intact bilaterally. DP pulses intact bilaterally but faint, 1+ Pulmonary/Chest: Normal respiratory  effort, CTAB, no wheezes, rales, or rhonchi Abdominal: Soft. Non-tender, non-distended Musculoskeletal: Moves all 4 extremities spontaneously Neurological: A&O x3, cranial nerve II-XII are grossly intact, no focal motor deficit Skin: Multiple skin tears with bleeding at the left forearm. Scattered bruising on all extremities. With bruising of the dorsum of the right foot, that is improving per daughter. Feet are cold and purplish in color- per daughter, this occurs intermittently. 2-3 + pitting edema of bilateral feet with extension up into the left foot with 1-2+ pitting edema of the left foot only.  Psychiatric: Normal mood and affect.   Assessment & Plan:   Please refer to Problem List based Assessment and Plan

## 2013-08-31 NOTE — Assessment & Plan Note (Signed)
Peripheral edema likely worsening by low albumin levels. Per her daughter, her appetite and po intake have significantly improved. Restarting Lasix which was held due to worsening renal fx. Will need to monitor renal function on the Lasix and albumin levels with her improved po intake.

## 2013-08-31 NOTE — Progress Notes (Signed)
Pt's dtr contacted with pain med Rx for pt, notice of need for f/u appt for pt's b/p in 1-2 weeks and need to call [facility] billing office before endocrinology referral back to Dr. Buddy Duty could be accomplished. Dtr verbalized understanding of all these directions. Yvonna Alanis, RN, 08/31/13, 3:42PM

## 2013-08-31 NOTE — Assessment & Plan Note (Signed)
With her multiple falls and bruising, will need to check a CBC at her next visit in 1 week.

## 2013-08-31 NOTE — Assessment & Plan Note (Addendum)
Weight is stable. The family is applying for assistance for Ensure. Paperwork was filled out by me today and has been scanned into the system. Per her daughter, her appetite has greatly improved since returning home from [place] around 4/24 and she has been enjoying drinking the supplemental protein shakes. Last albumin was 1.4 on 08/10/13, which is likely contributing to her peripheral edema.  - Consider checking prealbumin at her next clinic visit given her increased po intake over the past 1+week.

## 2013-08-31 NOTE — Assessment & Plan Note (Addendum)
Na and Cl elevated today at 149 and 114. Cl levels have been around 114 over the past month, but this hypernatremia is new. ?if lab error. Restarting the Lasix should help improve these values if they are true.  - Recheck BMP next week

## 2013-08-31 NOTE — Patient Instructions (Addendum)
Take 10mg  (1/2) of the Lasix tablet Start the potassium at 73mEq daily (1/2 tablet) and we will recheck labs in clinic next week.  We will also check your blood counts next week.  I am referring you to Dr. Buddy Duty for your adrenal insufficiency. Please follow up with him as soon as possible. Your hydrocortisone may need to be increased.   Please ask the physician to assess the swelling in your legs and look at the bruising in your feet. If it is not improving, we may need to consider stopping the Eliquis.

## 2013-09-01 LAB — PTH, INTACT AND CALCIUM
CALCIUM: 8.1 mg/dL — AB (ref 8.4–10.5)
PTH: 336 pg/mL — ABNORMAL HIGH (ref 14.0–72.0)

## 2013-09-02 ENCOUNTER — Encounter: Payer: Medicare Other | Admitting: Internal Medicine

## 2013-09-02 NOTE — Progress Notes (Signed)
Case discussed with Dr. Glenn soon after the resident saw the patient.  We reviewed the resident's history and exam and pertinent patient test results.  I agree with the assessment, diagnosis, and plan of care documented in the resident's note. 

## 2013-09-09 ENCOUNTER — Encounter: Payer: Medicare Other | Admitting: Internal Medicine

## 2013-09-10 ENCOUNTER — Telehealth: Payer: Self-pay | Admitting: *Deleted

## 2013-09-10 ENCOUNTER — Ambulatory Visit: Payer: Medicare Other | Admitting: Internal Medicine

## 2013-09-10 NOTE — Telephone Encounter (Signed)
Approval form faxed from hospice, please see in your box, sign and have chilon fax to hospice- it is for med changes. They explained that hospice care will not pay for furosemide nor K+ but that it possibly will be paid for by medicare.

## 2013-09-13 NOTE — Telephone Encounter (Signed)
She was supposed to follow up in the clinic last week. She needs a follow up appointment for her edema and have labs checked. Please call to get her in this week.

## 2013-09-15 NOTE — Telephone Encounter (Signed)
Spoke w/ pt on 5/18 she stated she would speak w/ her daughter and call for an appt. Today i have reviewed her chart and no appt, called for so i called her ph#'s one has been disconnected, the other i left a message

## 2013-09-16 ENCOUNTER — Other Ambulatory Visit: Payer: Self-pay | Admitting: Internal Medicine

## 2013-09-16 ENCOUNTER — Encounter: Payer: Self-pay | Admitting: Internal Medicine

## 2013-09-16 ENCOUNTER — Ambulatory Visit: Payer: Medicare Other | Admitting: Internal Medicine

## 2013-09-17 ENCOUNTER — Other Ambulatory Visit: Payer: Self-pay | Admitting: Internal Medicine

## 2013-09-22 NOTE — Telephone Encounter (Signed)
appt 6/2

## 2013-09-28 ENCOUNTER — Encounter: Payer: Self-pay | Admitting: Internal Medicine

## 2013-09-28 ENCOUNTER — Ambulatory Visit (INDEPENDENT_AMBULATORY_CARE_PROVIDER_SITE_OTHER): Admitting: Internal Medicine

## 2013-09-28 VITALS — BP 88/54 | HR 85 | Temp 96.5°F | Wt 115.0 lb

## 2013-09-28 DIAGNOSIS — R296 Repeated falls: Secondary | ICD-10-CM

## 2013-09-28 DIAGNOSIS — N183 Chronic kidney disease, stage 3 unspecified: Secondary | ICD-10-CM

## 2013-09-28 DIAGNOSIS — T148XXA Other injury of unspecified body region, initial encounter: Secondary | ICD-10-CM

## 2013-09-28 DIAGNOSIS — E213 Hyperparathyroidism, unspecified: Secondary | ICD-10-CM

## 2013-09-28 DIAGNOSIS — Z9181 History of falling: Secondary | ICD-10-CM

## 2013-09-28 DIAGNOSIS — E2740 Unspecified adrenocortical insufficiency: Secondary | ICD-10-CM

## 2013-09-28 DIAGNOSIS — E43 Unspecified severe protein-calorie malnutrition: Secondary | ICD-10-CM

## 2013-09-28 DIAGNOSIS — D649 Anemia, unspecified: Secondary | ICD-10-CM

## 2013-09-28 DIAGNOSIS — W19XXXA Unspecified fall, initial encounter: Secondary | ICD-10-CM

## 2013-09-28 DIAGNOSIS — E2749 Other adrenocortical insufficiency: Secondary | ICD-10-CM

## 2013-09-28 DIAGNOSIS — R609 Edema, unspecified: Secondary | ICD-10-CM

## 2013-09-28 DIAGNOSIS — N189 Chronic kidney disease, unspecified: Secondary | ICD-10-CM

## 2013-09-28 DIAGNOSIS — I82409 Acute embolism and thrombosis of unspecified deep veins of unspecified lower extremity: Secondary | ICD-10-CM

## 2013-09-28 LAB — COMPLETE METABOLIC PANEL WITH GFR
ALK PHOS: 115 U/L (ref 39–117)
AST: 9 U/L (ref 0–37)
Albumin: 2.6 g/dL — ABNORMAL LOW (ref 3.5–5.2)
BUN: 44 mg/dL — ABNORMAL HIGH (ref 6–23)
CALCIUM: 8.3 mg/dL — AB (ref 8.4–10.5)
CHLORIDE: 115 meq/L — AB (ref 96–112)
CO2: 16 mEq/L — ABNORMAL LOW (ref 19–32)
CREATININE: 2.35 mg/dL — AB (ref 0.50–1.10)
GFR, Est African American: 21 mL/min — ABNORMAL LOW
GFR, Est Non African American: 18 mL/min — ABNORMAL LOW
Glucose, Bld: 116 mg/dL — ABNORMAL HIGH (ref 70–99)
POTASSIUM: 4.4 meq/L (ref 3.5–5.3)
Sodium: 143 mEq/L (ref 135–145)
Total Bilirubin: 0.2 mg/dL (ref 0.2–1.2)
Total Protein: 5.8 g/dL — ABNORMAL LOW (ref 6.0–8.3)

## 2013-09-28 LAB — CBC
HCT: 35.9 % — ABNORMAL LOW (ref 36.0–46.0)
Hemoglobin: 11.9 g/dL — ABNORMAL LOW (ref 12.0–15.0)
MCH: 29.1 pg (ref 26.0–34.0)
MCHC: 33.1 g/dL (ref 30.0–36.0)
MCV: 87.8 fL (ref 78.0–100.0)
PLATELETS: 289 10*3/uL (ref 150–400)
RBC: 4.09 MIL/uL (ref 3.87–5.11)
RDW: 14.4 % (ref 11.5–15.5)
WBC: 8.1 10*3/uL (ref 4.0–10.5)

## 2013-09-28 MED ORDER — FUROSEMIDE 20 MG PO TABS: 10.0000 mg | ORAL_TABLET | ORAL | Status: AC | PRN

## 2013-09-28 NOTE — Assessment & Plan Note (Signed)
Assessment: Pt with central adrenal insufficiency on chronic corticosteroid therapy who presents with blood pressure of 88/55 near baseline of 90/50's.   Plan: -Obtain CMP -Continue hydrocortisone 10 mg in AM and  5 mg in PM -Pt to follow-up with endocrinologist Dr. Buddy Duty soon for further [facility]

## 2013-09-28 NOTE — Assessment & Plan Note (Addendum)
Assessment: Pt with 1st episode of right LE provoked DVT in setting of recent trauma and immobilization while at [facility] April 2015.  Plan: -Continue apixaban 2.5 mg BID for at least 3 month duration (July 2015) -Monitor for bleeding

## 2013-09-28 NOTE — Assessment & Plan Note (Addendum)
Assessment: Pt with probable secondary hyperparathyroidism (last PTH elevated at 336 on 08/31/13) with normal corrected calcium on 08/31/13 most likely due to vitamin D deficiency from chronic intestinal malabsorption and CKD Stage 3.   Plan: -Obtain CMP -Consider repeating 25-OH vitamin D levels  -Continue paricalcitol 1 mcg daily

## 2013-09-28 NOTE — Patient Instructions (Addendum)
General Instructions: -Will check your labs today -Continue your current medications -Please schedule an appt with Dr. Buddy Duty -Continue ensure supplements -Continue moisturizing your skin -Will see you back in 1 month -Nice seeing you again!   Please bring your medicines with you each time you come to clinic.  Medicines may include prescription medications, over-the-counter medications, herbal remedies, eye drops, vitamins, or other pills.   Progress Toward Treatment Goals:  Treatment Goal 01/28/2013  Prevent falls unchanged    Self Care Goals & Plans:  Self Care Goal 08/31/2013  Manage my medications take my medicines as prescribed; refill my medications on time; bring my medications to every visit  Eat healthy foods eat baked foods instead of fried foods; eat foods that are low in salt  Be physically active find an activity I enjoy  Prevent falls use home fall prevention checklist to improve safety    No flowsheet data found.   Care Management & Community Referrals:  Referral 08/31/2013  Referrals made for care management support -  Referrals made to community resources other (see comments)

## 2013-09-28 NOTE — Assessment & Plan Note (Addendum)
Assessment: Pt with chronic normocytic anemia possibly multifactoral due to ACD vs iron malabsorption with baseline Hg of 12 on AC therapy who presents with no recent bleeding or hemodynamic instability.  Plan:  -Obtain CBC -Consider anemia panel (last one in 2012) -Monitor for bleeding in setting of [facility] therapy

## 2013-09-28 NOTE — Assessment & Plan Note (Addendum)
Assessment: Pt s/p right colectomy in 2001 with chronic hypoalbuminemia, chronic diarrhea with probable malabsorption, and gluten intolerance who presents with poor appetite and 8 lb weight loss since 08/31/13.  Plan: -Obtain CMP -Encourage Ensure protein-calorie supplementation

## 2013-09-28 NOTE — Progress Notes (Signed)
Case discussed with Dr. Rabbani at the time of the visit.  We reviewed the resident's history and exam and pertinent patient test results.  I agree with the assessment, diagnosis, and plan of care documented in the resident's note. 

## 2013-09-28 NOTE — Assessment & Plan Note (Addendum)
Assessment: Pt with history of frequent falls and bruising currently on [facility] therapy with no recent falls since decreasing ambulation.   Plan: -Continue home supervision  -Continue apixaban 2.5 mg BID in setting of 1st episode of provoked DVT

## 2013-09-28 NOTE — Assessment & Plan Note (Addendum)
Assessment: Pt with history of psoriasis and multiple areas of ecchymosis currently on [facility] therapy with very dry, thin, and scaly skin improved with topical moisturizer application.    Plan: -Continue topcial moisturizer application to affected areas -Monitor for bleeding

## 2013-09-28 NOTE — Progress Notes (Signed)
Patient ID: Rachel Bridges, female   DOB: [birth year], 80 y.o.   MRN: [id number]   Subjective:   Patient ID: Rachel Bridges female   DOB: June 27, 1924 80 y.o.   MRN: [id number]  HPI: [name] is a 80 y.o. pleasant woman with past medical history of adrenal insufficiency, MGUS, colon cancer s/p resection, recurrent ileus due to chronic dysmotility, gluten intolerance, CKD Stage III, frequent falls, osteopenia with left hip fracture in July 2014, and recent right LE DVT on apixaban who presents for follow-up visit.   Per daughter, pt has had no further falls at home. She has supervision and has been mostly stationary. She is compliant with apixaban but still easily bruises with no recent reports of bleeding. Her bruise present on the dorsum of her right foot has improved.  She has occasional bilateral leg pain and takes lasix 10 mg daily only as needed for fluid retention. She denies lightheadedness, syncope, dyspnea, palpitations, or pleuritic chest pain.   She was to see Dr. Buddy Duty for management of her chronic adrenal insufficiency but has not been able to schedule an appointment yet. She is compliant with taking hydrocortisone 10 mg in AM and 5 mg in PM.   Her appetite continues to be poor and takes ensure as tolerated. Her weight today has decreased from 123 lb on 5/5 to 115 lb today. She reports no nausea, vomiting, or change in BM/abdominal pain from baseline.     Past Medical History  Diagnosis Date  . MGUS (monoclonal gammopathy of unknown significance)      Followed by Dr. Jamse Arn at regional [place]  . Anxiety   . Depression   . Diverticulosis of colon   . Osteopenia   . Lumbar spinal stenosis   . Atrophic vaginitis   . History of tobacco abuse     at least 64 pack year. Started about age 28 and continued until about age 59.  Marland Kitchen Dermatitis   . Hip pain      History  . Actinic keratosis   . Dementia   . Adrenal suppression     central, on chronic  steroids, followed by Dr. Buddy Duty of endocrinology  . Gluten intolerance     August 2011 endoscopy with biopsy negative for celiac disease. Prior to this patient had elevated TTG IgG of 93 (normal <20). Dr Collene Mares made clinical diagnosis of gluten intolerance based on diarrhea/symptoms from eating gluten. Needs GF diet.   Daughter tries to control diet.  . Shortness of breath     "sometimes; can happen at any time" (10/14/2012)  . Stress incontinence in female   . Subcapital fracture of left hip 11/13/2012    Dr Mardelle Matte  . History of high cholesterol   . Anemia   . History of blood transfusion     "S/P hip OR"  . History of stomach ulcers     "years ago" (07/28/2013)  . Arthritis     "right knee; back" (07/28/2013)  . Renal cyst   . Chronic kidney disease (CKD), stage IV (severe)   . Colon cancer     History of bowel  obstruction. on treatment with Colace and MiraLax.  followed by Dr. Collene Mares and Dr. Barkley Bruns   Current Outpatient Prescriptions  Medication Sig Dispense Refill  . acetaminophen (TYLENOL) 325 MG tablet Take 325 mg by mouth daily as needed. For pain      . apixaban (ELIQUIS) 2.5 MG TABS tablet Take 2.5 mg by mouth 2 (two)  times daily.      Marland Kitchen aspirin 81 MG tablet Take 81 mg by mouth daily.       . bisacodyl (DULCOLAX) 10 MG suppository Place 10 mg rectally daily as needed for mild constipation or moderate constipation.      . calcium citrate-vitamin D (CITRACAL+D) 315-200 MG-UNIT per tablet Take 1 tablet by mouth 2 (two) times daily.  60 tablet  11  . feeding supplement, ENSURE COMPLETE, (ENSURE COMPLETE) LIQD Take 237 mLs by mouth 2 (two) times daily between meals.      . furosemide (LASIX) 20 MG tablet Take 0.5 tablets (10 mg total) by mouth daily.  30 tablet  11  . HYDROcodone-acetaminophen (NORCO) 5-325 MG per tablet Take 1 tablet by mouth every 6 (six) hours as needed for moderate pain.  30 tablet  0  . hydrocortisone (CORTEF) 10 MG tablet Take 5-10 mg by mouth 2 (two) times daily.  10 mg in the a.m. And 5 mg in the afternoon.      Marland Kitchen LORazepam (ATIVAN) 0.5 MG tablet Take one tablet by mouth every 6 hours as needed for anxiety  120 tablet  5  . paricalcitol (ZEMPLAR) 1 MCG capsule Take 1 capsule (1 mcg total) by mouth daily.  30 capsule  6  . potassium chloride 20 MEQ TBCR Take 10 mEq by mouth daily.  30 tablet  3  . sertraline (ZOLOFT) 25 MG tablet Take 1 tablet (25 mg total) by mouth every morning.  30 tablet  6  . sertraline (ZOLOFT) 50 MG tablet TAKE 1 TABLET BY MOUTH EVERY NIGHT AT BEDTIME  90 tablet  3  . Sodium Phosphates (RA SALINE ENEMA RE) Place 1 enema rectally daily as needed (constipation).       No current facility-administered medications for this visit.   No family history on file. History   Social History  . Marital Status: Widowed    Spouse Name: N/A    Number of Children: N/A  . Years of Education: N/A   Social History Main Topics  . Smoking status: Former Smoker -- 1.00 packs/day for 47 years    Types: Cigarettes    Start date: 09/01/1987    Quit date: 08/01/1988  . Smokeless tobacco: Never Used  . Alcohol Use: Yes     Comment: 07/28/2013 "used to drink; been quit > 40 years"  . Drug Use: No  . Sexual Activity: No   Other Topics Concern  . Not on file   Social History Narrative    Husband died of myeloma.  Patient lives with daughter and son in [place], with [facility] services.   Review of Systems: Review of Systems  Constitutional: Positive for weight loss and malaise/fatigue. Negative for fever and chills.  Eyes: Negative for blurred vision.  Respiratory: Positive for shortness of breath (with activity). Negative for cough.   Cardiovascular: Positive for leg swelling (occasioanally). Negative for chest pain and palpitations.  Gastrointestinal: Positive for abdominal pain (chronic, at baseline) and diarrhea (chronic, at baseline).  Neurological: Positive for weakness. Negative for dizziness, sensory change, speech change, focal  weakness, loss of consciousness and headaches.  Endo/Heme/Allergies: Bruises/bleeds easily.    Objective:  Physical Exam: Filed Vitals:   09/28/13 1622  BP: 88/54  Pulse: 85  Temp: 96.5 F (35.8 C)  TempSrc: Axillary  Weight: 115 lb (52.164 kg)  SpO2: 100%    Physical Exam  Constitutional: She is oriented to person, place, and time. No distress.  Thin and  frail appearing   HENT:  Head: Normocephalic and atraumatic.  Right Ear: External ear normal.  Left Ear: External ear normal.  Nose: Nose normal.  Mouth/Throat: Oropharynx is clear and moist. No oropharyngeal exudate.  Eyes: EOM are normal. Right eye exhibits no discharge. Left eye exhibits no discharge. No scleral icterus.  Neck: Normal range of motion. Neck supple.  Cardiovascular: Normal rate, regular rhythm and normal heart sounds.   Pulmonary/Chest: Effort normal. She has no wheezes. She has no rales.  Decreased breath sounds  Abdominal: Soft. Bowel sounds are normal. She exhibits no distension. There is no tenderness. There is no rebound and no guarding.  Musculoskeletal: Normal range of motion. She exhibits no edema and no tenderness.  Neurological: She is alert and oriented to person, place, and time. No cranial nerve deficit.  Skin: Skin is dry. She is not diaphoretic.  Multiple ecchymosis on upper and lower extremities  Very dry and thin skin with easy flaking  Cool extremities   Psychiatric: She has a normal mood and affect. Her behavior is normal. Judgment and thought content normal.    Assessment & Plan:  Please see problem-list for problem based assessment and plan

## 2013-09-28 NOTE — Assessment & Plan Note (Addendum)
Assessment: Pt with CKD Stage 3 with baseline Cr of 1.3, recently restarted on lasix therapy.   Plan: -Obtain CMP ---> Cr increased from 1.25 on 5/5  to 2.35, pt to return on 6/5 for repeat BMP -Pt instructed to hold furosemide 10 mg PRN fluid retention -Pt instructed to increase fluid and dietary intake -Continue to monitor

## 2013-10-01 ENCOUNTER — Ambulatory Visit: Admitting: Internal Medicine

## 2013-10-01 ENCOUNTER — Other Ambulatory Visit (INDEPENDENT_AMBULATORY_CARE_PROVIDER_SITE_OTHER): Payer: Medicare Other

## 2013-10-01 DIAGNOSIS — N183 Chronic kidney disease, stage 3 unspecified: Secondary | ICD-10-CM

## 2013-10-01 LAB — BASIC METABOLIC PANEL
BUN: 52 mg/dL — AB (ref 6–23)
CHLORIDE: 111 meq/L (ref 96–112)
CO2: 15 mEq/L — ABNORMAL LOW (ref 19–32)
Calcium: 8 mg/dL — ABNORMAL LOW (ref 8.4–10.5)
Creat: 2.43 mg/dL — ABNORMAL HIGH (ref 0.50–1.10)
GLUCOSE: 98 mg/dL (ref 70–99)
Potassium: 4 mEq/L (ref 3.5–5.3)
Sodium: 144 mEq/L (ref 135–145)

## 2013-10-01 MED ORDER — NYSTATIN-TRIAMCINOLONE 100000-0.1 UNIT/GM-% EX OINT: 1.0000 "application " | TOPICAL_OINTMENT | Freq: Two times a day (BID) | CUTANEOUS | Status: AC

## 2013-10-01 NOTE — Addendum Note (Signed)
Addended byJuluis Mire on: 10/01/2013 04:48 PM   Modules accepted: Orders

## 2013-10-01 NOTE — Addendum Note (Signed)
Addended byJuluis Mire on: 10/01/2013 04:44 PM   Modules accepted: Level of Service

## 2013-10-01 NOTE — Progress Notes (Signed)
Patient ID: Rachel Bridges, female   DOB: 05/26/1924, 79 y.o.   MRN: [id number]  Pt with lab only visit today to recheck her elevated creatinine from 6/2 that had increased to 2.35 from 1.25 on 5/5. Today stat BMP revealed Cr slightly increased to 2.43. Etiology most likely pre-renal azotemia (CBC at last visit hemoconcentrated) secondary due to decreased fluid/dietary intake and restarting of lasix thearpy 1 month ago. Pt advised to stop lasix therapy and increase fluid and dietary intake. Pt will come in for repeat BMP in 1 week.

## 2013-10-08 ENCOUNTER — Ambulatory Visit: Payer: Medicare Other | Admitting: Internal Medicine

## 2013-10-15 ENCOUNTER — Other Ambulatory Visit: Payer: Self-pay | Admitting: Internal Medicine

## 2013-10-18 NOTE — Telephone Encounter (Signed)
Yes. Thanks 

## 2013-10-27 DEATH — deceased

## 2013-11-04 ENCOUNTER — Telehealth: Payer: Self-pay | Admitting: Internal Medicine

## 2013-11-04 NOTE — Telephone Encounter (Signed)
returned pt call and lvm for pt to call use back with concern

## 2013-11-05 ENCOUNTER — Telehealth: Payer: Self-pay | Admitting: Internal Medicine

## 2013-11-05 NOTE — Telephone Encounter (Signed)
pt daughter called to advised that pt passed away 2013-10-20.Marland KitchenMarland Kitchenappts cx

## 2014-02-13 ENCOUNTER — Encounter: Payer: Self-pay | Admitting: Internal Medicine

## 2014-05-20 ENCOUNTER — Ambulatory Visit: Payer: Medicare Other

## 2014-05-20 ENCOUNTER — Other Ambulatory Visit: Payer: Medicare Other

## 2014-09-16 IMAGING — CT CT ABD-PELV W/ CM
1 of 3 series · 13 of 32 positions shown, 18 images · IV contrast (APPLIED)
Comparison: CT abdomen and pelvis 12/18/2011 and earlier.

CLINICAL DATA: 87-year-old female with distended abdomen nausea and
diarrhea.  Abdominal pain.

CT ABDOMEN AND PELVIS WITH CONTRAST
TECHNIQUE: Multidetector CT imaging of the abdomen and pelvis was
performed following the standard protocol during bolus
administration of intravenous contrast.
Contrast: 100mL OMNIPAQUE IOHEXOL 300 MG/ML  SOLN

[Series 2: abd/pelv with 5.0 b31f st · axial · 0.83mm/px · z∈[+832,+1258]mm · 13 of 97 slices shown, 18 images]
[im 6/97  soft-tissue]
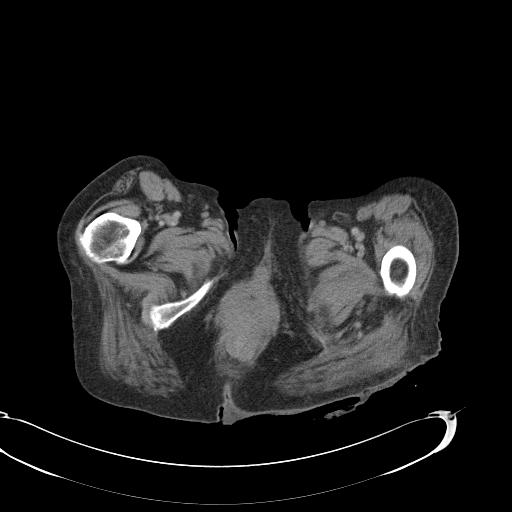
[im 6/97  bone]
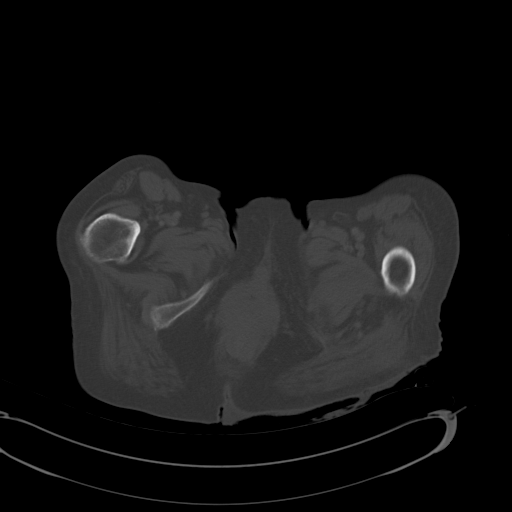
[im 17/97  soft-tissue]
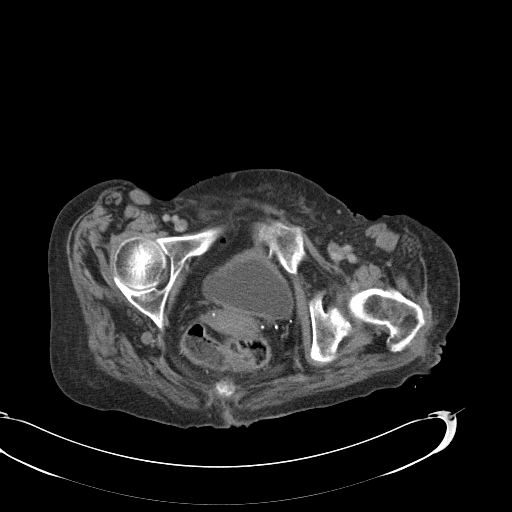
[im 23/97  soft-tissue]
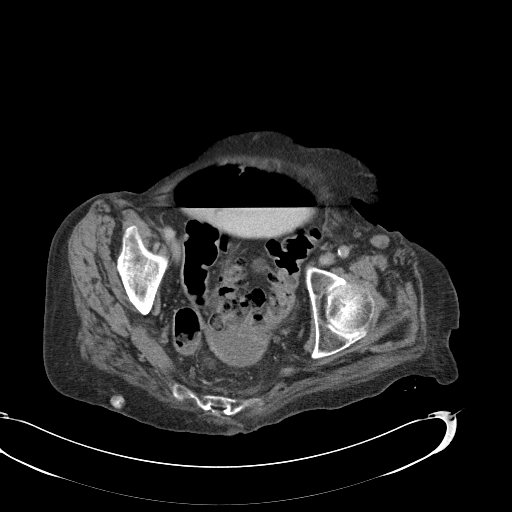
[im 29/97  soft-tissue]
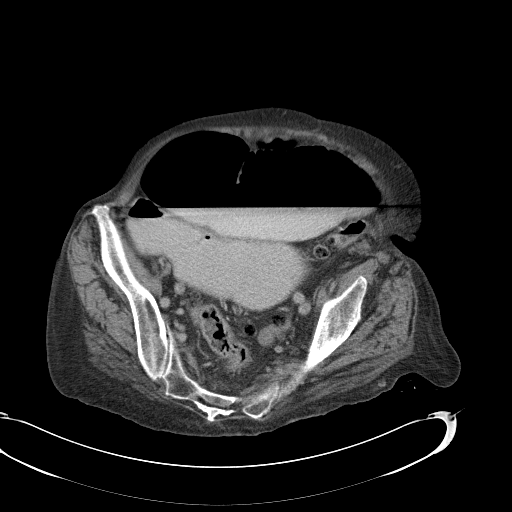
[im 40/97  soft-tissue]
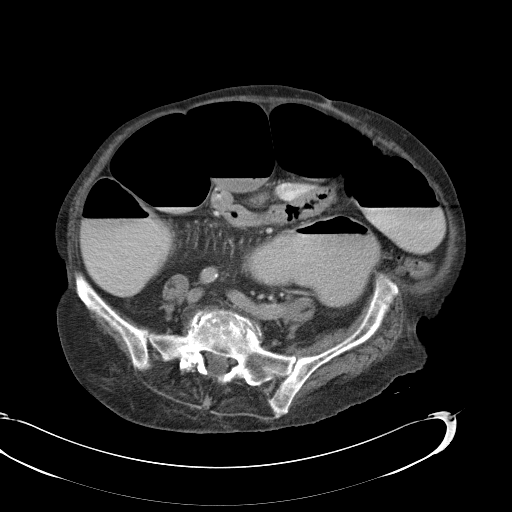
[im 46/97  soft-tissue]
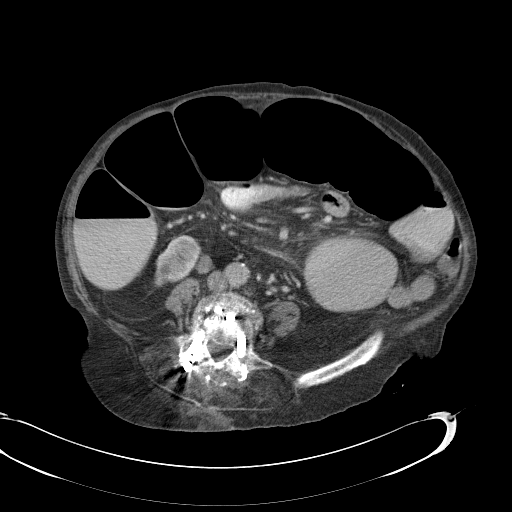
[im 51/97  soft-tissue]
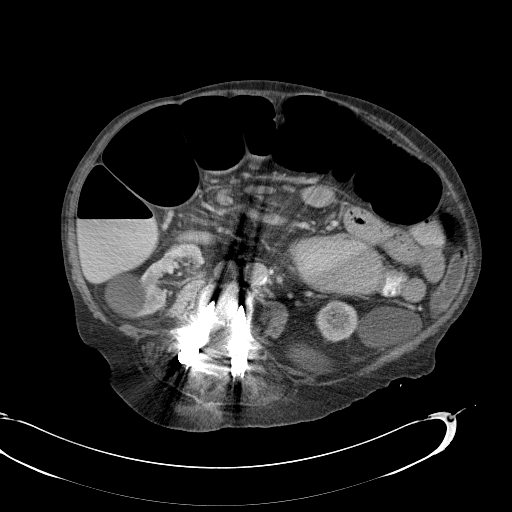
[im 63/97  soft-tissue]
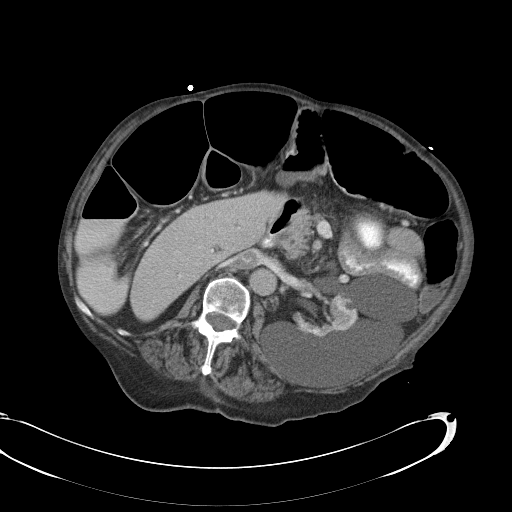
[im 68/97  soft-tissue]
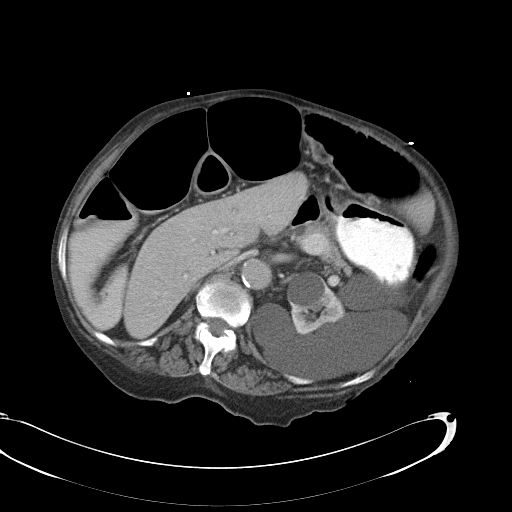
[im 68/97  bone]
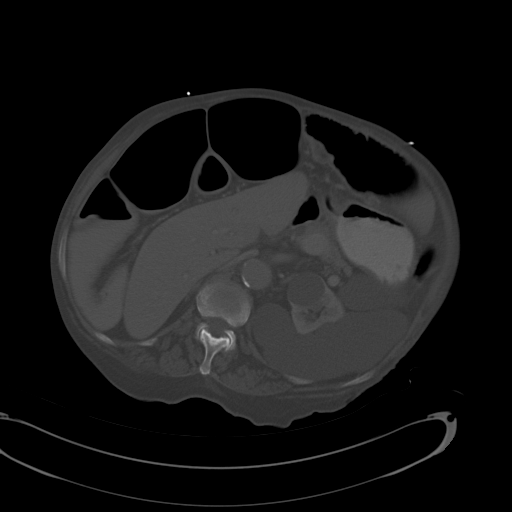
[im 74/97  soft-tissue]
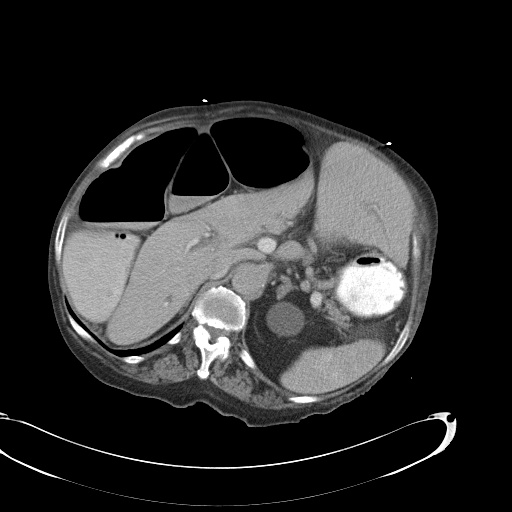
[im 74/97  lung]
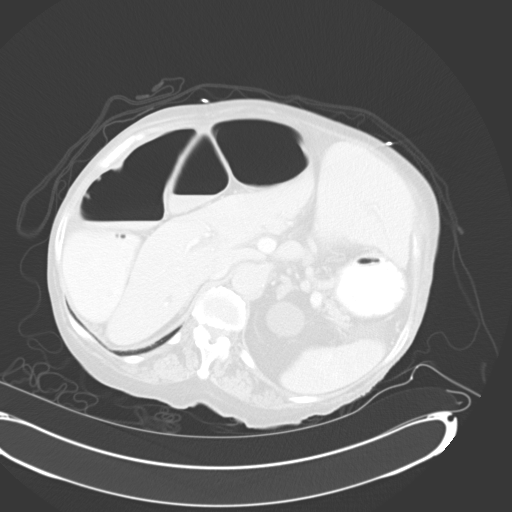
[im 80/97  lung]
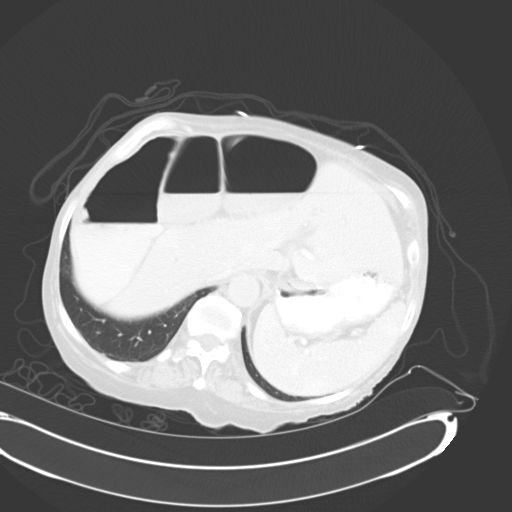
[im 85/97  soft-tissue]
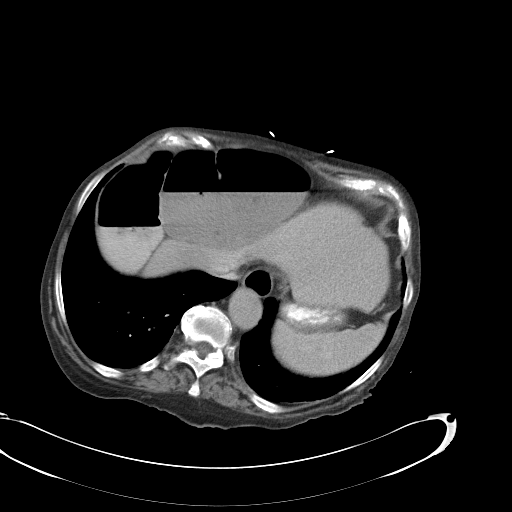
[im 85/97  lung]
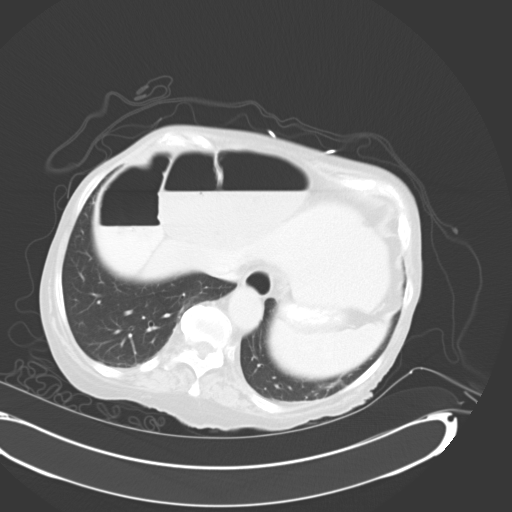
[im 91/97  soft-tissue]
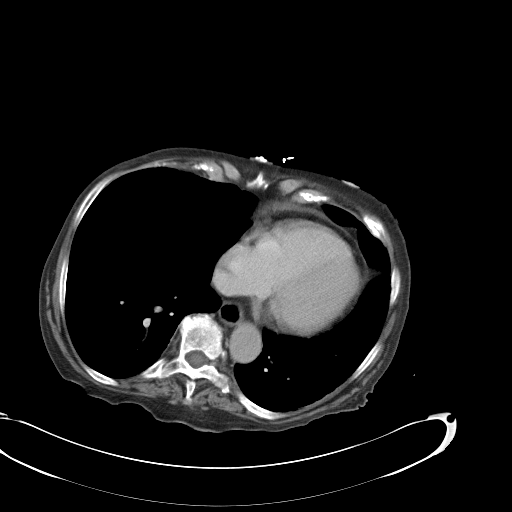
[im 91/97  lung]
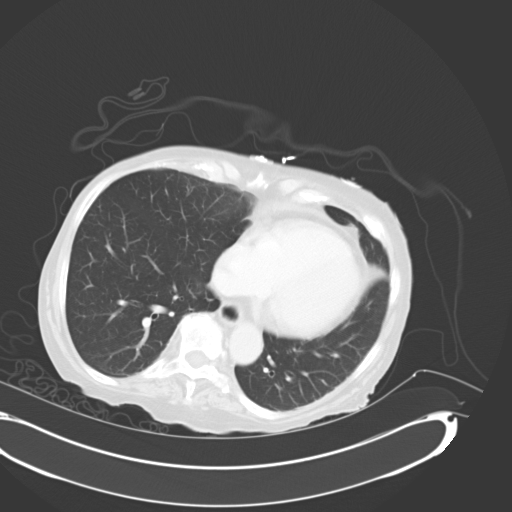

[13 of 32 positions shown; findings below may reference images not displayed]

FINDINGS: Stable lung bases.  Postoperative changes in the lumbar
spine.  Scoliosis. Stable visualized osseous structures.
Osteopenia.

Redundant distal colon with sigmoid diverticulosis.  As on prior
studies the sigmoid and rectum largely are decompressed.  There may
be mild wall thickening of the segments.

The diverticulosis continues into the left colon which is
decompressed.

Chronic severe redundancy and gaseous distention of the more
proximal colon then is re-identified and has not significantly
changed.  Some of the severely dilated bowel is small bowel.  A as
before there is only a small amount of normal caliber small bowel
present.  Oral contrast in the stomach and duodenum which are not
distended.  The appearance is not significantly changed since
12/18/2011 and earlier exams.

Stable liver.  The gallbladder is not identified.  The portal
venous system is patent.  The spleen and pancreas are stable and
within normal limits.  Adrenal glands are within normal limits.

Bulky exophytic low-density renal lesions are chronic and have
mildly enlarged since 2772.  No left hydronephrosis.  Smaller
exophytic right renal lesions likewise are stable.  No right
hydronephrosis.  No abdominal free fluid or pneumoperitoneum.
Major arterial structures in the abdomen and pelvis appear patent.
Largely decompressed bladder.

Chronic prominent mesenteric lymph nodes also are stable over this
series of exams.  These measure up to 15 mm individually in short
axis as before.
IMPRESSION: 1.  Chronic severe dilated proximal and mid colon as well as distal
small bowel.  Appearance not significantly changed over multiple
prior exams and suggests a severe chronic dysmotility.  As before,
the distal colon is decompressed.
2.  There is diverticulosis of the distal colon, but no convincing
evidence of distal colitis/diverticulitis.
3. Stable additional chronic findings in the abdomen and pelvis as
above.
